# Patient Record
Sex: Male | Born: 1945 | Race: White | Hispanic: No | Smoking: Former smoker
Health system: Southern US, Community
[De-identification: ages and names within clinical notes are randomized; demographics above are authoritative.]

## PROBLEM LIST (undated history)

## (undated) DIAGNOSIS — I2510 Atherosclerotic heart disease of native coronary artery without angina pectoris: Secondary | ICD-10-CM

## (undated) DIAGNOSIS — K219 Gastro-esophageal reflux disease without esophagitis: Secondary | ICD-10-CM

## (undated) DIAGNOSIS — F40240 Claustrophobia: Secondary | ICD-10-CM

## (undated) DIAGNOSIS — F4310 Post-traumatic stress disorder, unspecified: Secondary | ICD-10-CM

## (undated) DIAGNOSIS — G959 Disease of spinal cord, unspecified: Secondary | ICD-10-CM

## (undated) DIAGNOSIS — K5792 Diverticulitis of intestine, part unspecified, without perforation or abscess without bleeding: Secondary | ICD-10-CM

## (undated) DIAGNOSIS — Z87442 Personal history of urinary calculi: Secondary | ICD-10-CM

## (undated) DIAGNOSIS — R350 Frequency of micturition: Secondary | ICD-10-CM

## (undated) DIAGNOSIS — E785 Hyperlipidemia, unspecified: Secondary | ICD-10-CM

## (undated) DIAGNOSIS — J189 Pneumonia, unspecified organism: Secondary | ICD-10-CM

## (undated) DIAGNOSIS — N200 Calculus of kidney: Secondary | ICD-10-CM

## (undated) DIAGNOSIS — M1990 Unspecified osteoarthritis, unspecified site: Secondary | ICD-10-CM

## (undated) DIAGNOSIS — Z86718 Personal history of other venous thrombosis and embolism: Secondary | ICD-10-CM

## (undated) DIAGNOSIS — M4712 Other spondylosis with myelopathy, cervical region: Secondary | ICD-10-CM

## (undated) DIAGNOSIS — J302 Other seasonal allergic rhinitis: Secondary | ICD-10-CM

## (undated) DIAGNOSIS — M5412 Radiculopathy, cervical region: Secondary | ICD-10-CM

## (undated) DIAGNOSIS — N400 Enlarged prostate without lower urinary tract symptoms: Secondary | ICD-10-CM

## (undated) HISTORY — PX: HEMORRHOID SURGERY: SHX153

## (undated) HISTORY — PX: CARDIOVASCULAR STRESS TEST: SHX262

## (undated) HISTORY — PX: KNEE SURGERY: SHX244

## (undated) HISTORY — PX: NASAL SINUS SURGERY: SHX719

## (undated) HISTORY — PX: TRIGGER FINGER RELEASE: SHX641

## (undated) HISTORY — PX: KIDNEY STONE SURGERY: SHX686

## (undated) HISTORY — PX: OTHER SURGICAL HISTORY: SHX169

## (undated) HISTORY — PX: PILONIDAL CYST / SINUS EXCISION: SUR543

## (undated) HISTORY — PX: EYE SURGERY: SHX253

## (undated) HISTORY — PX: COLONOSCOPY: SHX174

## (undated) HISTORY — PX: CARDIAC CATHETERIZATION: SHX172

## (undated) HISTORY — PX: SHOULDER SURGERY: SHX246

---

## 1998-03-21 ENCOUNTER — Ambulatory Visit (HOSPITAL_BASED_OUTPATIENT_CLINIC_OR_DEPARTMENT_OTHER): Admission: RE | Admit: 1998-03-21 | Discharge: 1998-03-21 | Payer: Self-pay | Admitting: Orthopedic Surgery

## 1998-06-30 ENCOUNTER — Other Ambulatory Visit: Admission: RE | Admit: 1998-06-30 | Discharge: 1998-06-30 | Payer: Self-pay | Admitting: Otolaryngology

## 2000-09-12 ENCOUNTER — Encounter: Payer: Self-pay | Admitting: General Surgery

## 2000-09-12 ENCOUNTER — Ambulatory Visit (HOSPITAL_COMMUNITY): Admission: RE | Admit: 2000-09-12 | Discharge: 2000-09-12 | Payer: Self-pay | Admitting: General Surgery

## 2000-09-24 ENCOUNTER — Ambulatory Visit (HOSPITAL_COMMUNITY): Admission: RE | Admit: 2000-09-24 | Discharge: 2000-09-24 | Payer: Self-pay | Admitting: Urology

## 2001-07-18 ENCOUNTER — Encounter: Payer: Self-pay | Admitting: Family Medicine

## 2001-07-18 ENCOUNTER — Ambulatory Visit (HOSPITAL_COMMUNITY): Admission: RE | Admit: 2001-07-18 | Discharge: 2001-07-18 | Payer: Self-pay | Admitting: Family Medicine

## 2001-12-03 ENCOUNTER — Encounter: Payer: Self-pay | Admitting: Urology

## 2001-12-03 ENCOUNTER — Ambulatory Visit (HOSPITAL_COMMUNITY): Admission: RE | Admit: 2001-12-03 | Discharge: 2001-12-03 | Payer: Self-pay | Admitting: Urology

## 2002-12-22 ENCOUNTER — Ambulatory Visit (HOSPITAL_COMMUNITY): Admission: RE | Admit: 2002-12-22 | Discharge: 2002-12-22 | Payer: Self-pay | Admitting: Urology

## 2002-12-22 ENCOUNTER — Encounter: Payer: Self-pay | Admitting: Urology

## 2003-12-30 ENCOUNTER — Encounter (HOSPITAL_COMMUNITY): Admission: RE | Admit: 2003-12-30 | Discharge: 2004-01-29 | Payer: Self-pay | Admitting: Orthopedic Surgery

## 2004-07-24 ENCOUNTER — Ambulatory Visit (HOSPITAL_COMMUNITY): Admission: RE | Admit: 2004-07-24 | Discharge: 2004-07-24 | Payer: Self-pay | Admitting: Urology

## 2004-09-01 ENCOUNTER — Ambulatory Visit (HOSPITAL_COMMUNITY): Admission: RE | Admit: 2004-09-01 | Discharge: 2004-09-01 | Payer: Self-pay | Admitting: *Deleted

## 2005-07-23 ENCOUNTER — Ambulatory Visit (HOSPITAL_COMMUNITY): Admission: RE | Admit: 2005-07-23 | Discharge: 2005-07-23 | Payer: Self-pay | Admitting: Urology

## 2005-10-30 ENCOUNTER — Ambulatory Visit (HOSPITAL_COMMUNITY): Admission: RE | Admit: 2005-10-30 | Discharge: 2005-10-30 | Payer: Self-pay | Admitting: General Surgery

## 2006-06-17 ENCOUNTER — Ambulatory Visit (HOSPITAL_COMMUNITY): Admission: RE | Admit: 2006-06-17 | Discharge: 2006-06-17 | Payer: Self-pay | Admitting: Internal Medicine

## 2006-06-24 ENCOUNTER — Ambulatory Visit: Payer: Self-pay | Admitting: Orthopedic Surgery

## 2006-08-16 ENCOUNTER — Ambulatory Visit (HOSPITAL_COMMUNITY): Admission: RE | Admit: 2006-08-16 | Discharge: 2006-08-16 | Payer: Self-pay | Admitting: Urology

## 2007-02-17 ENCOUNTER — Ambulatory Visit (HOSPITAL_COMMUNITY): Admission: RE | Admit: 2007-02-17 | Discharge: 2007-02-17 | Payer: Self-pay | Admitting: General Surgery

## 2007-02-17 ENCOUNTER — Encounter (INDEPENDENT_AMBULATORY_CARE_PROVIDER_SITE_OTHER): Payer: Self-pay | Admitting: General Surgery

## 2008-02-25 ENCOUNTER — Ambulatory Visit (HOSPITAL_COMMUNITY): Admission: RE | Admit: 2008-02-25 | Discharge: 2008-02-25 | Payer: Self-pay | Admitting: Urology

## 2008-07-05 ENCOUNTER — Ambulatory Visit (HOSPITAL_COMMUNITY): Admission: RE | Admit: 2008-07-05 | Discharge: 2008-07-05 | Payer: Self-pay | Admitting: Urology

## 2008-12-15 ENCOUNTER — Ambulatory Visit (HOSPITAL_COMMUNITY): Admission: RE | Admit: 2008-12-15 | Discharge: 2008-12-15 | Payer: Self-pay | Admitting: Urology

## 2009-01-03 ENCOUNTER — Encounter: Admission: RE | Admit: 2009-01-03 | Discharge: 2009-01-03 | Payer: Self-pay | Admitting: Orthopedic Surgery

## 2009-07-20 HISTORY — PX: US ECHOCARDIOGRAPHY: HXRAD669

## 2009-07-20 HISTORY — PX: NM MYOVIEW LTD: HXRAD82

## 2009-08-23 ENCOUNTER — Emergency Department (HOSPITAL_COMMUNITY): Admission: EM | Admit: 2009-08-23 | Discharge: 2009-08-23 | Payer: Self-pay | Admitting: Emergency Medicine

## 2010-07-02 ENCOUNTER — Encounter: Payer: Self-pay | Admitting: Internal Medicine

## 2010-09-03 LAB — CBC
HCT: 44.2 % (ref 39.0–52.0)
MCHC: 34.2 g/dL (ref 30.0–36.0)
MCV: 86.6 fL (ref 78.0–100.0)
RBC: 5.11 MIL/uL (ref 4.22–5.81)
RDW: 13.7 % (ref 11.5–15.5)

## 2010-09-03 LAB — HEPATIC FUNCTION PANEL
Albumin: 3.7 g/dL (ref 3.5–5.2)
Alkaline Phosphatase: 58 U/L (ref 39–117)
Bilirubin, Direct: 0.2 mg/dL (ref 0.0–0.3)
Total Protein: 6.6 g/dL (ref 6.0–8.3)

## 2010-09-03 LAB — BASIC METABOLIC PANEL
BUN: 14 mg/dL (ref 6–23)
CO2: 25 mEq/L (ref 19–32)
Chloride: 105 mEq/L (ref 96–112)
Creatinine, Ser: 1.04 mg/dL (ref 0.4–1.5)
GFR calc Af Amer: >60 mL/min (ref >60)
Potassium: 3.3 mEq/L — ABNORMAL LOW (ref 3.5–5.1)

## 2010-09-03 LAB — DIFFERENTIAL
Basophils Absolute: 0 10*3/uL (ref 0.0–0.1)
Basophils Relative: 1 % (ref 0–1)
Eosinophils Absolute: 0 10*3/uL (ref 0.0–0.7)
Eosinophils Relative: 0 % (ref 0–5)
Lymphocytes Relative: 16 % (ref 12–46)
Lymphs Abs: 1.4 10*3/uL (ref 0.7–4.0)
Monocytes Absolute: 0.5 10*3/uL (ref 0.1–1.0)
Neutro Abs: 6.8 10*3/uL (ref 1.7–7.7)

## 2010-09-18 ENCOUNTER — Emergency Department (HOSPITAL_COMMUNITY)
Admission: EM | Admit: 2010-09-18 | Discharge: 2010-09-18 | Disposition: A | Discharge status: Home / Self Care | Payer: Medicare Other | Attending: Emergency Medicine | Admitting: Emergency Medicine

## 2010-09-18 DIAGNOSIS — S46909A Unspecified injury of unspecified muscle, fascia and tendon at shoulder and upper arm level, unspecified arm, initial encounter: Secondary | ICD-10-CM | POA: Insufficient documentation

## 2010-09-18 DIAGNOSIS — E7800 Pure hypercholesterolemia, unspecified: Secondary | ICD-10-CM | POA: Insufficient documentation

## 2010-09-18 DIAGNOSIS — M25519 Pain in unspecified shoulder: Secondary | ICD-10-CM | POA: Insufficient documentation

## 2010-09-18 DIAGNOSIS — M545 Low back pain, unspecified: Secondary | ICD-10-CM | POA: Insufficient documentation

## 2010-09-18 DIAGNOSIS — S4980XA Other specified injuries of shoulder and upper arm, unspecified arm, initial encounter: Secondary | ICD-10-CM | POA: Insufficient documentation

## 2010-09-18 DIAGNOSIS — IMO0002 Reserved for concepts with insufficient information to code with codable children: Secondary | ICD-10-CM | POA: Insufficient documentation

## 2010-09-18 DIAGNOSIS — Z86718 Personal history of other venous thrombosis and embolism: Secondary | ICD-10-CM | POA: Insufficient documentation

## 2010-09-18 DIAGNOSIS — R209 Unspecified disturbances of skin sensation: Secondary | ICD-10-CM | POA: Insufficient documentation

## 2010-09-18 DIAGNOSIS — IMO0001 Reserved for inherently not codable concepts without codable children: Secondary | ICD-10-CM | POA: Insufficient documentation

## 2010-10-24 NOTE — Op Note (Signed)
NAME:  Casey Castillo, Casey Castillo NO.:  [id number]   MEDICAL RECORD NO.:  [id number]          PATIENT TYPE:  AMB   LOCATION:  DAY                           FACILITY:  [facility]   PHYSICIAN:  Dalia Heading, M.D.  DATE OF BIRTH:  1946-03-14   DATE OF PROCEDURE:  02/17/2007  DATE OF DISCHARGE:                               OPERATIVE REPORT   PREOPERATIVE DIAGNOSIS:  Bleeding hemorrhoidal disease.   POSTOPERATIVE DIAGNOSIS:  Bleeding hemorrhoidal disease.   PROCEDURE:  Extensive hemorrhoidectomy.   SURGEON:  Dalia Heading, M.D.   ANESTHESIA:  General.   INDICATIONS:  The patient is a 65 year old of white male who presents  with bleeding hemorrhoidal disease.  The risks and benefits of the  procedure including bleeding, infection, and recurrence of the  hemorrhoidal disease, were fully explained to the patient, who gave  informed consent.   PROCEDURE NOTE:  The patient was placed in the lithotomy position after  general anesthesia was administered.  The perineum was prepped and  draped using the usual sterile technique with Betadine.  Surgical site  confirmation was performed.   The patient was noted to have extensive hemorrhoidal disease.  The most  significant was along the left lateral aspect of the anus.  An internal  and external hemorrhoidectomy was performed in continuity.  The specimen  was sent to pathology for further examination.  The mucosal edges were  reapproximated using 2-0 Vicryl running suture.  A small external  hemorrhoidal skin tab was noted at the 12 o'clock position.  This was  excised without difficulty and the mucosal edges were reapproximated  using a 3-0 Vicryl interrupted suture.  The patient did have some other  hemorrhoidal disease, though it was not significant enough to warrant  surgery at this time.  Sensorcaine 0.5% was instilled into the  surrounding perineum.  Surgicel and viscous Xylocaine packing was then  placed into the rectum.   All tape and needle counts were correct at the end of the procedure.  The patient was awakened and transferred to PACU in stable condition.   COMPLICATIONS:  None.   SPECIMEN:  Hemorrhoids.   BLOOD LOSS:  Minimal.      Dalia Heading, M.D.  Electronically Signed     MAJ/MEDQ  D:  02/17/2007  T:  02/17/2007  Job:  [id number]   cc:   Patrica Duel, M.D.  Fax: [phone]

## 2010-10-24 NOTE — H&P (Signed)
NAME:  Casey Castillo, Casey Castillo NO.:  [id number]   MEDICAL RECORD NO.:  [id number]          PATIENT TYPE:  AMB   LOCATION:  DAY                           FACILITY:  [facility]   PHYSICIAN:  Dalia Heading, M.D.  DATE OF BIRTH:  1946/04/19   DATE OF ADMISSION:  DATE OF DISCHARGE:  LH                              HISTORY & PHYSICAL   CHIEF COMPLAINT:  Bleeding hemorrhoidal disease.   HISTORY OF PRESENT ILLNESS:  The patient is a 65 year old white male who  is referred for evaluation and treatment of hemorrhoidal disease.  This  has been present for some time; but he is starting to have more  discomfort and itching.  Some blood is noted on the toilet paper when he  wipes himself.   PAST MEDICAL HISTORY:  As noted above.   PAST SURGICAL HISTORY:  1. Colonoscopy.  2. Knee surgery.  3. Right hand surgery.   CURRENT MEDICATIONS:  Vytorin, Flomax, baby aspirin, fish oil.   ALLERGIES:  No known drug allergies.   REVIEW OF SYSTEMS:  Noncontributory.   PHYSICAL EXAMINATION:  GENERAL:  The patient is a well-developed, well-  nourished white male in no acute distress.  LUNGS:  Clear to auscultation with equal breath sounds bilaterally.  HEART:  Examination reveals regular rate and rhythm without S3-S4,  or  murmurs.  ABDOMEN:  Soft, nontender, nondistended.  No hepatosplenomegaly, masses,  hernias are identified.  RECTAL:  Examination reveals external hemorrhoidal skin tag anteriorly  with a large prolapsing hemorrhoid noted along the left lateral aspect  of the anus.   IMPRESSION:  Bleeding hemorrhoidal disease.   PLAN:  The patient is scheduled for extensive hemorrhoidectomy on  February 17, 2007.  Risks and benefits of the procedure including  bleeding, infection, pain, recurrence of hemorrhoids were fully  explained to the patient, who gave informed consent.      Dalia Heading, M.D.  Electronically Signed     MAJ/MEDQ  D:  02/11/2007  T:  02/12/2007  Job:   [id number]   cc:   Patrica Duel, M.D.  Fax: [phone]

## 2010-10-27 NOTE — H&P (Signed)
NAME:  Casey Castillo, MAISH NO.:  [id number]   MEDICAL RECORD NO.:  [id number]          PATIENT TYPE:   LOCATION:                                 FACILITY:   PHYSICIAN:  Dalia Heading, M.D.  DATE OF BIRTH:  25-Mar-1946   DATE OF ADMISSION:  DATE OF DISCHARGE:  LH                                HISTORY & PHYSICAL   CHIEF COMPLAINT:  History of colon polyps.   HISTORY OF PRESENT ILLNESS:  The patient is a 65 year old white male who is  referred for endoscopic evaluation.  The patient was found on a colonoscopy  in 2002 to have colon polyps in the ascending colon and sigmoid colon  regions.  Does have a history of diverticulitis.  There is no abdominal  pain, weight loss, nausea, vomiting, diarrhea, constipation, melena,  hematochezia.  There is no family history of colon carcinoma.   PAST MEDICAL HISTORY:  As noted above.   PAST SURGICAL HISTORY:  Knee surgery, right hand surgery.   CURRENT MEDICATIONS:  None.   ALLERGIES:  No known drug allergies.   REVIEW OF SYSTEMS:  Noncontributory.   PHYSICAL EXAMINATION:  The patient is a well-developed, well-nourished white  male in no acute distress.  Lungs are clear to auscultation with equal  breath sounds bilaterally.  Heart examination reveals a regular rate and  rhythm without S3, S4, murmurs.  The abdomen is soft, nontender,  nondistended.  No hepatosplenomegaly or masses are noted.  Rectal  examination was deferred to the procedure.   IMPRESSION:  History of colon polyps, diverticulosis.   PLAN:  The patient is scheduled for colonoscopy on Oct 30, 2005.  Risks and  benefits of the procedure including bleeding and perforation were fully  explained to the patient, gained informed consent.      Dalia Heading, M.D.  Electronically Signed     MAJ/MEDQ  D:  10/16/2005  T:  10/16/2005  Job:  [id number]   cc:   Madelin Rear. Sherwood Gambler, MD  Fax: [phone]

## 2011-03-23 LAB — BASIC METABOLIC PANEL
BUN: 13
CO2: 29
Calcium: 8.8
Creatinine, Ser: 1.11
Glucose, Bld: 97

## 2011-03-23 LAB — CBC
HCT: 41.8
Hemoglobin: 14.2
MCV: 84.7
RBC: 4.94
WBC: 7.5

## 2011-12-31 ENCOUNTER — Other Ambulatory Visit: Payer: Self-pay | Admitting: Neurological Surgery

## 2011-12-31 DIAGNOSIS — M47812 Spondylosis without myelopathy or radiculopathy, cervical region: Secondary | ICD-10-CM

## 2012-01-08 ENCOUNTER — Ambulatory Visit
Admission: RE | Admit: 2012-01-08 | Discharge: 2012-01-08 | Disposition: A | Discharge status: Home / Self Care | Payer: Medicare Other | Source: Ambulatory Visit | Attending: Neurological Surgery | Admitting: Neurological Surgery

## 2012-01-08 DIAGNOSIS — M47812 Spondylosis without myelopathy or radiculopathy, cervical region: Secondary | ICD-10-CM

## 2012-01-16 ENCOUNTER — Other Ambulatory Visit: Payer: Self-pay | Admitting: Neurological Surgery

## 2012-01-16 ENCOUNTER — Encounter (HOSPITAL_COMMUNITY): Payer: Self-pay | Admitting: Pharmacy Technician

## 2012-01-22 ENCOUNTER — Ambulatory Visit (HOSPITAL_COMMUNITY)
Admission: RE | Admit: 2012-01-22 | Discharge: 2012-01-22 | Disposition: A | Discharge status: Home / Self Care | Payer: Medicare Other | Source: Ambulatory Visit | Attending: Neurological Surgery | Admitting: Neurological Surgery

## 2012-01-22 ENCOUNTER — Encounter (HOSPITAL_COMMUNITY)
Admission: RE | Admit: 2012-01-22 | Discharge: 2012-01-22 | Disposition: A | Discharge status: Home / Self Care | Payer: Medicare Other | Source: Ambulatory Visit | Attending: Neurological Surgery | Admitting: Neurological Surgery

## 2012-01-22 ENCOUNTER — Encounter (HOSPITAL_COMMUNITY): Payer: Self-pay

## 2012-01-22 DIAGNOSIS — Z01812 Encounter for preprocedural laboratory examination: Secondary | ICD-10-CM | POA: Insufficient documentation

## 2012-01-22 DIAGNOSIS — Z01818 Encounter for other preprocedural examination: Secondary | ICD-10-CM | POA: Insufficient documentation

## 2012-01-22 HISTORY — DX: Atherosclerotic heart disease of native coronary artery without angina pectoris: I25.10

## 2012-01-22 HISTORY — DX: Frequency of micturition: R35.0

## 2012-01-22 HISTORY — DX: Hyperlipidemia, unspecified: E78.5

## 2012-01-22 HISTORY — DX: Benign prostatic hyperplasia without lower urinary tract symptoms: N40.0

## 2012-01-22 HISTORY — DX: Calculus of kidney: N20.0

## 2012-01-22 HISTORY — DX: Diverticulitis of intestine, part unspecified, without perforation or abscess without bleeding: K57.92

## 2012-01-22 HISTORY — DX: Personal history of other venous thrombosis and embolism: Z86.718

## 2012-01-22 LAB — CBC
Platelets: 173 10*3/uL (ref 150–400)
RBC: 5.08 MIL/uL (ref 4.22–5.81)
WBC: 7.9 10*3/uL (ref 4.0–10.5)

## 2012-01-22 LAB — SURGICAL PCR SCREEN: MRSA, PCR: NEGATIVE

## 2012-01-22 LAB — BASIC METABOLIC PANEL
BUN: 14 mg/dL (ref 6–23)
GFR calc Af Amer: 72 mL/min — ABNORMAL LOW (ref >90)
GFR calc non Af Amer: 62 mL/min — ABNORMAL LOW (ref >90)
Potassium: 3.7 mEq/L (ref 3.5–5.1)
Sodium: 141 mEq/L (ref 135–145)

## 2012-01-22 NOTE — Progress Notes (Signed)
Requested most recent EKG, last office visit notes, stress test and echo from [facility] heart and vascular

## 2012-01-22 NOTE — Pre-Procedure Instructions (Signed)
20 Casey Castillo  01/22/2012   Your procedure is scheduled on:  Monday January 28, 2012  Report to [facility] at 10:45 AM.  Call this number if you have problems the morning of surgery: [phone]   Remember:   Do not eat food or drink:After Midnight.      Take these medicines the morning of surgery with A SIP OF WATER: flomax  DISCONTINUE ASPIRIN, COUMADIN, PLAVIX, EFFIENT, AND HERBAL MEDICATIONS   Do not wear jewelry, make-up or nail polish.  Do not wear lotions, powders, or perfumes. You may wear deodorant.  Do not shave 48 hours prior to surgery. Men may shave face and neck.  Do not bring valuables to the hospital.  Contacts, dentures or bridgework may not be worn into surgery.  Leave suitcase in the car. After surgery it may be brought to your room.  For patients admitted to the hospital, checkout time is 11:00 AM the day of discharge.   Patients discharged the day of surgery will not be allowed to drive home.  Name and phone number of your driver: Heywood Bene [phone]  Special Instructions: CHG Shower Use Special Wash: 1/2 bottle night before surgery and 1/2 bottle morning of surgery.   Please read over the following fact sheets that you were given: Pain Booklet, Coughing and Deep Breathing, MRSA Information and Surgical Site Infection Prevention

## 2012-01-23 NOTE — Consult Note (Signed)
Anesthesia chart review: Patient is a 66 year old male scheduled for C6-7 and C7-T1 ACDF on 01/28/2012 by Dr. Danielle Dess.  History includes former smoker, hyperlipidemia, diverticulitis, BPH, non-obstructive coronary artery disease (per [facility] notes), kidney stones, prior nasal/sinus, knee, shoulder, finger, and hemorrhoid surgeries.   His Cardiologist is Dr. Royann Shivers [facility]).  He was last seen on 12/25/11.  EKG then showed NSR.    Nuclear stress test [facility]) on 07/20/09 showed diaphragmatic attenuation, but no evidence of inducible myocardial ischemia, post-stress ejection fraction 61%, normal global left ventricular systolic function, low risk scan.  Echo [facility]) on 07/20/09 showed mild concentric LVH, normal LV systolic function, RV mildly dilated, LA mildly dilated, RA mildly dilated, trace MR, RV systolic pressure elevated at 09-81 mm Hg, trace tricuspid regurgitation.  Chest x-ray on 01/22/2012 showed no active cardiopulmonary disease.  Labs noted.  Anticipate he can proceed as planned.  Shonna Chock, PA-C

## 2012-01-27 MED ORDER — CEFAZOLIN SODIUM-DEXTROSE 2-3 GM-% IV SOLR
2.0000 g | INTRAVENOUS | Status: AC
  Administered 2012-01-28: 2 g via INTRAVENOUS
  Filled 2012-01-27: qty 50

## 2012-01-28 ENCOUNTER — Encounter (HOSPITAL_COMMUNITY)
Admission: RE | Disposition: A | Discharge status: Home / Self Care | Payer: Self-pay | Source: Ambulatory Visit | Attending: Neurological Surgery

## 2012-01-28 ENCOUNTER — Encounter (HOSPITAL_COMMUNITY): Payer: Self-pay | Admitting: Vascular Surgery

## 2012-01-28 ENCOUNTER — Ambulatory Visit (HOSPITAL_COMMUNITY): Payer: Medicare Other | Admitting: Vascular Surgery

## 2012-01-28 ENCOUNTER — Ambulatory Visit (HOSPITAL_COMMUNITY): Payer: Medicare Other

## 2012-01-28 ENCOUNTER — Encounter (HOSPITAL_COMMUNITY): Payer: Self-pay | Admitting: *Deleted

## 2012-01-28 ENCOUNTER — Inpatient Hospital Stay (HOSPITAL_COMMUNITY)
Admission: RE | Admit: 2012-01-28 | Discharge: 2012-01-29 | DRG: 473 | Disposition: A | Discharge status: Home / Self Care | Payer: Medicare Other | Source: Ambulatory Visit | Attending: Neurological Surgery | Admitting: Neurological Surgery

## 2012-01-28 DIAGNOSIS — E785 Hyperlipidemia, unspecified: Secondary | ICD-10-CM | POA: Diagnosis present

## 2012-01-28 DIAGNOSIS — Z79899 Other long term (current) drug therapy: Secondary | ICD-10-CM

## 2012-01-28 DIAGNOSIS — M47814 Spondylosis without myelopathy or radiculopathy, thoracic region: Secondary | ICD-10-CM | POA: Diagnosis present

## 2012-01-28 DIAGNOSIS — M47812 Spondylosis without myelopathy or radiculopathy, cervical region: Secondary | ICD-10-CM

## 2012-01-28 DIAGNOSIS — M5020 Other cervical disc displacement, unspecified cervical region: Secondary | ICD-10-CM | POA: Diagnosis present

## 2012-01-28 DIAGNOSIS — Z7982 Long term (current) use of aspirin: Secondary | ICD-10-CM

## 2012-01-28 DIAGNOSIS — I2510 Atherosclerotic heart disease of native coronary artery without angina pectoris: Secondary | ICD-10-CM | POA: Diagnosis present

## 2012-01-28 HISTORY — PX: ANTERIOR CERVICAL DECOMP/DISCECTOMY FUSION: SHX1161

## 2012-01-28 LAB — TYPE AND SCREEN: Antibody Screen: NEGATIVE

## 2012-01-28 SURGERY — ANTERIOR CERVICAL DECOMPRESSION/DISCECTOMY FUSION 2 LEVELS
Anesthesia: General | Site: Neck | Wound class: Clean

## 2012-01-28 MED ORDER — SODIUM CHLORIDE 0.9 % IJ SOLN: 3.0000 mL | Freq: Two times a day (BID) | INTRAMUSCULAR | Status: DC

## 2012-01-28 MED ORDER — GLYCOPYRROLATE 0.2 MG/ML IJ SOLN
INTRAMUSCULAR | Status: DC | PRN
  Administered 2012-01-28: 0.4 mg via INTRAVENOUS

## 2012-01-28 MED ORDER — LACTATED RINGERS IV SOLN
INTRAVENOUS | Status: DC | PRN
  Administered 2012-01-28 (×2): via INTRAVENOUS

## 2012-01-28 MED ORDER — LORATADINE 10 MG PO TABS
10.0000 mg | ORAL_TABLET | Freq: Every day | ORAL | Status: DC
Start: 2012-01-28 — End: 2012-01-29
  Filled 2012-01-28 (×2): qty 1

## 2012-01-28 MED ORDER — TAMSULOSIN HCL 0.4 MG PO CAPS: 0.4000 mg | ORAL_CAPSULE | Freq: Every day | ORAL | Status: DC

## 2012-01-28 MED ORDER — DOCUSATE SODIUM 100 MG PO CAPS
100.0000 mg | ORAL_CAPSULE | Freq: Two times a day (BID) | ORAL | Status: DC
  Administered 2012-01-28: 100 mg via ORAL
  Filled 2012-01-28: qty 1

## 2012-01-28 MED ORDER — NEOSTIGMINE METHYLSULFATE 1 MG/ML IJ SOLN
INTRAMUSCULAR | Status: DC | PRN
  Administered 2012-01-28: 3 mg via INTRAVENOUS

## 2012-01-28 MED ORDER — VECURONIUM BROMIDE 10 MG IV SOLR
INTRAVENOUS | Status: DC | PRN
  Administered 2012-01-28: 1 mg via INTRAVENOUS
  Administered 2012-01-28: 2 mg via INTRAVENOUS
  Administered 2012-01-28: 1 mg via INTRAVENOUS

## 2012-01-28 MED ORDER — ACETAMINOPHEN 650 MG RE SUPP: 650.0000 mg | RECTAL | Status: DC | PRN

## 2012-01-28 MED ORDER — DEXAMETHASONE SODIUM PHOSPHATE 10 MG/ML IJ SOLN
INTRAMUSCULAR | Status: DC | PRN
Start: 2012-01-28 — End: 2012-01-28
  Administered 2012-01-28: 10 mg via INTRAVENOUS

## 2012-01-28 MED ORDER — SENNA 8.6 MG PO TABS
1.0000 | ORAL_TABLET | Freq: Two times a day (BID) | ORAL | Status: DC
  Filled 2012-01-28 (×2): qty 1

## 2012-01-28 MED ORDER — MEPERIDINE HCL 25 MG/ML IJ SOLN: 6.2500 mg | INTRAMUSCULAR | Status: DC | PRN

## 2012-01-28 MED ORDER — SIMVASTATIN 40 MG PO TABS
40.0000 mg | ORAL_TABLET | Freq: Every day | ORAL | Status: DC
Start: 2012-01-28 — End: 2012-01-29
  Administered 2012-01-28: 40 mg via ORAL
  Filled 2012-01-28 (×2): qty 1

## 2012-01-28 MED ORDER — HEMOSTATIC AGENTS (NO CHARGE) OPTIME
TOPICAL | Status: DC | PRN
  Administered 2012-01-28: 1 via TOPICAL

## 2012-01-28 MED ORDER — ONDANSETRON HCL 4 MG/2ML IJ SOLN: 4.0000 mg | INTRAMUSCULAR | Status: DC | PRN

## 2012-01-28 MED ORDER — MENTHOL 3 MG MT LOZG: 1.0000 | LOZENGE | OROMUCOSAL | Status: DC | PRN

## 2012-01-28 MED ORDER — BACITRACIN 50000 UNITS IM SOLR
INTRAMUSCULAR | Status: AC
  Filled 2012-01-28: qty 1

## 2012-01-28 MED ORDER — SODIUM CHLORIDE 0.9 % IV SOLN
INTRAVENOUS | Status: AC
  Filled 2012-01-28: qty 500

## 2012-01-28 MED ORDER — ALUM & MAG HYDROXIDE-SIMETH 200-200-20 MG/5ML PO SUSP: 30.0000 mL | Freq: Four times a day (QID) | ORAL | Status: DC | PRN

## 2012-01-28 MED ORDER — THROMBIN 5000 UNITS EX KIT
PACK | CUTANEOUS | Status: DC | PRN
  Administered 2012-01-28 (×2): 5000 [IU] via TOPICAL

## 2012-01-28 MED ORDER — ROCURONIUM BROMIDE 100 MG/10ML IV SOLN
INTRAVENOUS | Status: DC | PRN
  Administered 2012-01-28: 50 mg via INTRAVENOUS

## 2012-01-28 MED ORDER — PROPOFOL 10 MG/ML IV EMUL
INTRAVENOUS | Status: DC | PRN
  Administered 2012-01-28: 200 mg via INTRAVENOUS

## 2012-01-28 MED ORDER — ONDANSETRON HCL 4 MG/2ML IJ SOLN
INTRAMUSCULAR | Status: DC | PRN
  Administered 2012-01-28: 4 mg via INTRAVENOUS

## 2012-01-28 MED ORDER — FENTANYL CITRATE 0.05 MG/ML IJ SOLN
INTRAMUSCULAR | Status: DC | PRN
  Administered 2012-01-28: 200 ug via INTRAVENOUS
  Administered 2012-01-28 (×4): 50 ug via INTRAVENOUS

## 2012-01-28 MED ORDER — TAMSULOSIN HCL 0.4 MG PO CAPS
0.4000 mg | ORAL_CAPSULE | Freq: Every day | ORAL | Status: DC
  Administered 2012-01-28 – 2012-01-29 (×2): 0.4 mg via ORAL
  Filled 2012-01-28 (×2): qty 1

## 2012-01-28 MED ORDER — SODIUM CHLORIDE 0.9 % IV SOLN: 250.0000 mL | INTRAVENOUS | Status: DC

## 2012-01-28 MED ORDER — HYDROMORPHONE HCL PF 1 MG/ML IJ SOLN: 0.2500 mg | INTRAMUSCULAR | Status: DC | PRN

## 2012-01-28 MED ORDER — MIDAZOLAM HCL 5 MG/5ML IJ SOLN
INTRAMUSCULAR | Status: DC | PRN
  Administered 2012-01-28: 1 mg via INTRAVENOUS

## 2012-01-28 MED ORDER — OXYCODONE-ACETAMINOPHEN 5-325 MG PO TABS
1.0000 | ORAL_TABLET | ORAL | Status: DC | PRN
  Administered 2012-01-28 – 2012-01-29 (×2): 2 via ORAL
  Filled 2012-01-28 (×2): qty 2

## 2012-01-28 MED ORDER — LIDOCAINE HCL (CARDIAC) 20 MG/ML IV SOLN
INTRAVENOUS | Status: DC | PRN
  Administered 2012-01-28: 30 mg via INTRAVENOUS

## 2012-01-28 MED ORDER — LIDOCAINE-EPINEPHRINE 1 %-1:100000 IJ SOLN
INTRAMUSCULAR | Status: DC | PRN
  Administered 2012-01-28: 3.5 mL

## 2012-01-28 MED ORDER — MIDAZOLAM HCL 2 MG/2ML IJ SOLN: 0.5000 mg | Freq: Once | INTRAMUSCULAR | Status: DC | PRN

## 2012-01-28 MED ORDER — DIAZEPAM 5 MG PO TABS: 5.0000 mg | ORAL_TABLET | Freq: Four times a day (QID) | ORAL | Status: DC | PRN

## 2012-01-28 MED ORDER — MORPHINE SULFATE 2 MG/ML IJ SOLN
1.0000 mg | INTRAMUSCULAR | Status: DC | PRN
  Administered 2012-01-28: 4 mg via INTRAVENOUS
  Administered 2012-01-28: 2 mg via INTRAVENOUS
  Filled 2012-01-28: qty 1
  Filled 2012-01-28: qty 2

## 2012-01-28 MED ORDER — SODIUM CHLORIDE 0.9 % IJ SOLN: 3.0000 mL | INTRAMUSCULAR | Status: DC | PRN

## 2012-01-28 MED ORDER — PROMETHAZINE HCL 25 MG/ML IJ SOLN: 6.2500 mg | INTRAMUSCULAR | Status: DC | PRN

## 2012-01-28 MED ORDER — LIDOCAINE HCL 4 % MT SOLN
OROMUCOSAL | Status: DC | PRN
  Administered 2012-01-28: 4 mL via TOPICAL

## 2012-01-28 MED ORDER — CEFAZOLIN SODIUM 1-5 GM-% IV SOLN
1.0000 g | Freq: Three times a day (TID) | INTRAVENOUS | Status: AC
  Administered 2012-01-28 – 2012-01-29 (×2): 1 g via INTRAVENOUS
  Filled 2012-01-28 (×2): qty 50

## 2012-01-28 MED ORDER — PHENOL 1.4 % MT LIQD: 1.0000 | OROMUCOSAL | Status: DC | PRN

## 2012-01-28 MED ORDER — BUPIVACAINE HCL (PF) 0.5 % IJ SOLN
INTRAMUSCULAR | Status: DC | PRN
  Administered 2012-01-28: 3.5 mL

## 2012-01-28 MED ORDER — 0.9 % SODIUM CHLORIDE (POUR BTL) OPTIME
TOPICAL | Status: DC | PRN
  Administered 2012-01-28: 1000 mL

## 2012-01-28 MED ORDER — ACETAMINOPHEN 325 MG PO TABS: 650.0000 mg | ORAL_TABLET | ORAL | Status: DC | PRN

## 2012-01-28 MED ORDER — SODIUM CHLORIDE 0.9 % IR SOLN
Status: DC | PRN
  Administered 2012-01-28: 16:00:00

## 2012-01-28 SURGICAL SUPPLY — 57 items
ADH SKN CLS APL DERMABOND .7 (GAUZE/BANDAGES/DRESSINGS) ×1
BAG DECANTER FOR FLEXI CONT (MISCELLANEOUS) ×2 IMPLANT
BANDAGE GAUZE ELAST BULKY 4 IN (GAUZE/BANDAGES/DRESSINGS) ×2 IMPLANT
BIT DRILL NEURO 2X3.1 SFT TUCH (MISCELLANEOUS) ×1 IMPLANT
BUR BARREL STRAIGHT FLUTE 4.0 (BURR) ×2 IMPLANT
CANISTER SUCTION 2500CC (MISCELLANEOUS) ×2 IMPLANT
CLOTH BEACON ORANGE TIMEOUT ST (SAFETY) ×2 IMPLANT
CONT SPEC 4OZ CLIKSEAL STRL BL (MISCELLANEOUS) ×2 IMPLANT
DECANTER SPIKE VIAL GLASS SM (MISCELLANEOUS) ×1 IMPLANT
DERMABOND ADVANCED (GAUZE/BANDAGES/DRESSINGS) ×1
DERMABOND ADVANCED .7 DNX12 (GAUZE/BANDAGES/DRESSINGS) ×1 IMPLANT
DRAPE LAPAROTOMY 100X72 PEDS (DRAPES) ×2 IMPLANT
DRAPE MICROSCOPE LEICA (MISCELLANEOUS) IMPLANT
DRAPE POUCH INSTRU U-SHP 10X18 (DRAPES) ×2 IMPLANT
DRILL NEURO 2X3.1 SOFT TOUCH (MISCELLANEOUS) ×2
DURAPREP 6ML APPLICATOR 50/CS (WOUND CARE) ×2 IMPLANT
ELECT REM PT RETURN 9FT ADLT (ELECTROSURGICAL) ×2
ELECTRODE REM PT RTRN 9FT ADLT (ELECTROSURGICAL) ×1 IMPLANT
GAUZE SPONGE 4X4 16PLY XRAY LF (GAUZE/BANDAGES/DRESSINGS) ×1 IMPLANT
GLOVE BIO SURGEON STRL SZ8.5 (GLOVE) ×1 IMPLANT
GLOVE BIOGEL PI IND STRL 7.5 (GLOVE) IMPLANT
GLOVE BIOGEL PI IND STRL 8.5 (GLOVE) ×1 IMPLANT
GLOVE BIOGEL PI INDICATOR 7.5 (GLOVE) ×2
GLOVE BIOGEL PI INDICATOR 8.5 (GLOVE) ×1
GLOVE ECLIPSE 7.5 STRL STRAW (GLOVE) ×2 IMPLANT
GLOVE ECLIPSE 8.5 STRL (GLOVE) ×2 IMPLANT
GLOVE EXAM NITRILE LRG STRL (GLOVE) IMPLANT
GLOVE EXAM NITRILE MD LF STRL (GLOVE) IMPLANT
GLOVE EXAM NITRILE XL STR (GLOVE) IMPLANT
GLOVE EXAM NITRILE XS STR PU (GLOVE) IMPLANT
GLOVE SS BIOGEL STRL SZ 8 (GLOVE) IMPLANT
GLOVE SUPERSENSE BIOGEL SZ 8 (GLOVE) ×2
GOWN BRE IMP SLV AUR LG STRL (GOWN DISPOSABLE) IMPLANT
GOWN BRE IMP SLV AUR XL STRL (GOWN DISPOSABLE) ×2 IMPLANT
GOWN STRL REIN 2XL LVL4 (GOWN DISPOSABLE) ×2 IMPLANT
HEAD HALTER (SOFTGOODS) ×2 IMPLANT
KIT BASIN OR (CUSTOM PROCEDURE TRAY) ×2 IMPLANT
KIT ROOM TURNOVER OR (KITS) ×2 IMPLANT
NDL SPNL 22GX3.5 QUINCKE BK (NEEDLE) IMPLANT
NEEDLE HYPO 22GX1.5 SAFETY (NEEDLE) ×2 IMPLANT
NEEDLE SPNL 22GX3.5 QUINCKE BK (NEEDLE) ×4 IMPLANT
NS IRRIG 1000ML POUR BTL (IV SOLUTION) ×2 IMPLANT
PACK LAMINECTOMY NEURO (CUSTOM PROCEDURE TRAY) ×2 IMPLANT
PAD ARMBOARD 7.5X6 YLW CONV (MISCELLANEOUS) ×6 IMPLANT
PLATE 32MM (Plate) ×1 IMPLANT
PUTTY BONE 2.5CC ×1 IMPLANT
RUBBERBAND STERILE (MISCELLANEOUS) IMPLANT
SCREW 14MM (Screw) ×6 IMPLANT
SPONGE GAUZE 4X4 12PLY (GAUZE/BANDAGES/DRESSINGS) ×2 IMPLANT
SPONGE INTESTINAL PEANUT (DISPOSABLE) ×2 IMPLANT
SPONGE SURGIFOAM ABS GEL SZ50 (HEMOSTASIS) ×2 IMPLANT
SUT VIC AB 3-0 SH 8-18 (SUTURE) ×4 IMPLANT
SYR 20ML ECCENTRIC (SYRINGE) ×2 IMPLANT
TOWEL OR 17X24 6PK STRL BLUE (TOWEL DISPOSABLE) ×2 IMPLANT
TOWEL OR 17X26 10 PK STRL BLUE (TOWEL DISPOSABLE) ×2 IMPLANT
TZ Cervical Lordotic Large 6 mm ×2 IMPLANT
WATER STERILE IRR 1000ML POUR (IV SOLUTION) ×2 IMPLANT

## 2012-01-28 NOTE — H&P (Signed)
Casey Castillo presents for surgery today.   He has been having symptoms in that left shoulder and arm, and he had a new MRI of his cervical spine which demonstrates that he has evidence of some disease process at four levels including C3-4, 4-5, 5-6, and 6-7 and 7-1, which is actually five levels. However, the most significant areas in regards to his problem are at C6-7 and C7-T1.  He has radiculopathy on the left side involving the C7 and C8 distributions.   I demonstrated the findings to him. Though I do note that he has some degenerative changes at C5-6 and C4-5, I have advised that we should consider surgical intervention only for the lower levels of the cervical spine.  I indicated that it would be best to do a two-level anterior decompression and arthrodesis at C6-7 and C7-T1. This should help eliminate the worst of the C7 and C8 radiculopathy that he has on that left side.  I indicated that at some point he may require surgical decompression at 5-6 and 4-5; however, at this time this would be more surgery than is required for the symptomatic levels that he is experiencing.  I indicated that the surgery would be done through the front of the neck. This would be done by an incision low on the neck on the left side. We would remove the entirety of the discs at C6-7 and C7-T1 and we would place a bone graft into that space with a Titanium plate to hold it together. The down time should be quite minimal for him.  Most patients have the surgery with a simple overnight stay or sometimes even on an outpatient basis if it is done early enough in the day.  Thereafter the patient will require several weeks of low-grade activity before he gets back to a more vigorous working program.  I believe that this will help with the worst of his symptoms. I did caution him though that he does have other disease at other levels and at some point if those levels become symptomatic we may need to address them also.  He has failed  efforts at conservative management now having been treated by Dr. Delbert Harness.  He had Cortisone shot in the shoulder and he had a course of some oral Cortisone which did not seem to give him much relief.  The symptoms are persisting and he would like something definitive done. We will proceed with surgical intervention at the earliest possible convenience.

## 2012-01-28 NOTE — Transfer of Care (Signed)
Immediate Anesthesia Transfer of Care Note  Patient: Casey Castillo  Procedure(s) Performed: Procedure(s) (LRB): ANTERIOR CERVICAL DECOMPRESSION/DISCECTOMY FUSION 2 LEVELS (N/A)  Patient Location: PACU  Anesthesia Type: General  Level of Consciousness: awake, alert  and oriented  Airway & Oxygen Therapy: Patient Spontanous Breathing and Patient connected to nasal cannula oxygen  Post-op Assessment: Report given to PACU RN and Post -op Vital signs reviewed and stable  Post vital signs: Reviewed and stable  Complications: No apparent anesthesia complications

## 2012-01-28 NOTE — Anesthesia Preprocedure Evaluation (Addendum)
Anesthesia Evaluation  Patient identified by MRN, date of birth, ID band Patient awake    Reviewed: Allergy & Precautions, H&P , NPO status , Patient's Chart, lab work & pertinent test results  History of Anesthesia Complications Negative for: history of anesthetic complications  Airway Mallampati: I TM Distance: >3 FB Neck ROM: Full    Dental No notable dental hx. (+) Teeth Intact and Dental Advisory Given   Pulmonary  breath sounds clear to auscultation  Pulmonary exam normal       Cardiovascular + CAD (non-obstructive ASCADz by cath) Rhythm:Regular Rate:Normal  '11 stress test: EF 61%, no ischemia '11 ECHO: normal LVF, valves OK   Neuro/Psych    GI/Hepatic negative GI ROS, Neg liver ROS,   Endo/Other  Morbid obesity  Renal/GU Renal diseasenegative Renal ROS     Musculoskeletal   Abdominal (+) + obese,   Peds  Hematology   Anesthesia Other Findings   Reproductive/Obstetrics                          Anesthesia Physical Anesthesia Plan  ASA: II  Anesthesia Plan: General   Post-op Pain Management:    Induction: Intravenous  Airway Management Planned: Oral ETT  Additional Equipment:   Intra-op Plan:   Post-operative Plan: Extubation in OR  Informed Consent: I have reviewed the patients History and Physical, chart, labs and discussed the procedure including the risks, benefits and alternatives for the proposed anesthesia with the patient or authorized representative who has indicated his/her understanding and acceptance.   Dental advisory given  Plan Discussed with: CRNA and Surgeon  Anesthesia Plan Comments: (Plan routine monitors, GETA)        Anesthesia Quick Evaluation

## 2012-01-28 NOTE — Anesthesia Procedure Notes (Signed)
Procedure Name: Intubation Date/Time: 01/28/2012 2:56 PM Performed by: Lovie Chol Pre-anesthesia Checklist: Patient identified, Emergency Drugs available, Suction available, Patient being monitored and Timeout performed Patient Re-evaluated:Patient Re-evaluated prior to inductionOxygen Delivery Method: Circle system utilized Preoxygenation: Pre-oxygenation with 100% oxygen Intubation Type: IV induction Ventilation: Mask ventilation without difficulty Laryngoscope Size: Miller and 2 Grade View: Grade I Tube type: Oral Tube size: 7.5 mm Number of attempts: 1 Airway Equipment and Method: Stylet and LTA kit utilized Placement Confirmation: ETT inserted through vocal cords under direct vision,  positive ETCO2 and breath sounds checked- equal and bilateral Secured at: 22 cm Tube secured with: Tape Dental Injury: Teeth and Oropharynx as per pre-operative assessment

## 2012-01-28 NOTE — Op Note (Signed)
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 Asst.: Delma Officer M.D. Indications: Patient is a 66 year old individual is had weakness in the left upper extremity in a C8 distribution is evidence of significant spondylosis and foraminal overgrowth on the left sides at C6-7 and C7-T1 also has spondylosis at C4-5 and C5-6 these do not appear to be related to his current symptom is been advised regarding the need for surgical decompression and stabilization at 2 levels involved Procedure: The patient was brought to the operating room placed on the table in supine position. After the smooth induction of general endotracheal anesthesia neck was placed in 5 pounds of halter traction and prepped with alcohol and DuraPrep. After sterile draping and appropriate timeout procedure a transverse incision was created in the left side of the neck and carried down to the platysma. The plane between the sternocleidomastoid and strap muscles dissected bluntly until the prevertebral space was reached. The first identifiable disc space was noted to be C5-6 on a localizing radiograph. The dissection was then undertaken in the longus coli muscle to allow placement of a self-retaining Caspar type retractor.  The anterior longitudinal ligament was opened at C6-C7 and ventral osteophytes were removed with a Leksell rongeur and Kerrison punch. Interspace was cleared of significant quantity of the degenerated disc material from the  region of the posterior longitudinal ligament was reached. Dissection was carried out using a high-speed drill and 3-0 Karlin curettes. Uncinate  processes were drilled down and removed and osteophytes from the inferior margin of the body of C6 were removed with a Kerrison 2 mm gold punch. After the central canal and lateral recesses were well decompressed the stasis was achieved with the bipolar cautery and some small pledgets of Gelfoam soaked in thrombin that were later irrigated away.  A 6 mm transgraft was then prepared by enlarging the central cavity and filling with demineralized bone matrix and placing into the interspace.C7 T1Was decompressed and fused in a similar fashion.  Next the retractor was removed and a 32 mm trestle plate was placed over the vertebral bodies and secured with 14 mm variable angle screws. A final localizing radiograph identified the position of the surgical construct. Hemostasis was achieved in the soft tissues and then the platysma was closed with 3-0 Vicryl in an interrupted fashion and 3-0 Vicryl was used in the subcuticular tissue. Blood loss was estimated at50cc.

## 2012-01-28 NOTE — H&P (Signed)
Casey Castillo is an 66 y.o. male.   Chief Complaint: Neck shoulder and left arm pain HPI: CHIEF COMPLAINT:   Pinched nerve in neck causing left arm numbness.    HISTORY OF PRESENT ILLNESS:  Mr. Casey Castillo is a 66 year old, left-handed individual who is retired.  He tells me that he started developing some symptoms of pain, numbness and tingling in his left arm that has been going on for 6 months or perhaps a little less.  I had previously seen him for symptoms related to a herniated nucleus pulposus at L3-L4 which resolved spontaneously.  He is sent here via the courtesy of Dr. Eulah Pont as it is felt that he has a cervical radicular problem.    PAST MEDICAL HISTORY:  He has generally experienced good health.  He attaches a sheet with every procedure that he has had done over the number of a years and his complete medical history.  That is reviewed and will not be summarized in this note.    CURRENT MEDICATIONS:  His current medications that he is using for discomfort include Naproxen 500 mg twice a day.  It doesn't seem to be having much of an effect.    PERSONAL HISTORY:   He does not smoke.  He does not drink alcohol.  Height and weight have been stable at 5', 10" and 215 lbs.    REVIEW OF SYSTEMS:   Systems review is notable for using reading glasses, some arm pain as the only two symptoms on a 14-point review sheet noted in the office today.    PHYSICAL EXAMINATION:  He is an alert and oriented individual.  He turns 80 degrees to either side without difficulty.  He flexes and extends nearly normally.  Axial compression does not reproduce any overt symptoms.  There is no tenderness to palpation of the supraclavicular fossas and I note that his motor strength is good in the deltoids, the biceps, the triceps, the grips and the intrinsics.  Namely, he does not have any atrophy in the small muscles of the hand.  He does not have any Tinel's across the region of the elbow either.    DIAGNOSTIC  STUDIES:   Review of plain radiographs that he brings with him from the [facility] office demonstrate that he has moderate spondylitic changes in the   Casey Castillo  #[id number]  DOB:  1945/12/23     December 26, 2011  Page Two   lower cervical spine, although C7-T1 is difficult to visualize in the lateral projection because of his shoulders.  C6-C7 disc has some mild to moderate degenerative changes noted.    IMPRESSION:    The patient has evidence of a left C8 radiculopathy by his history and clinical exam.  His motor strength appears intact.  At this point, he needs an MRI of the cervical spine to better delineate any pathoanatomy.  He is interested in getting something definitive done as this process has been ongoing and he doesn't seem to be getting better.  We can make better judgment and make a plan for him once we have an MRI of his cervical spine to discuss if and what kind of surgery may be available to help him with his pain problem.    Past Medical History  Diagnosis Date  . Hyperlipidemia     primary Dr. Evette Cristal  . Enlarged prostate     takes flomax  . Urinary frequency     hx of   .  History of blood clots     november, 5 1995  . Diverticulitis     hx of  . Coronary artery disease     5%blockage on left anterior descending Dr. Royann Shivers   . Kidney stone     hx of sees Dr. Kathie Rhodes. Rito Ehrlich    Past Surgical History  Procedure Date  . Cardiac catheterization     09-06-11   . Cardiovascular stress test     2006 and 2008 at [facility] heart [facility] vascular, 2011 myoview and echo  . Pilonidal cyst / sinus excision     surgery 01-08-74  . Kidney stone surgery     lithotripsy 1980's  . Knee surgery     left 04-11-94 ( has had two surgeries to left knee)  . Trigger finger release     03-21-98 right hand ring finger  . Nasal sinus surgery     Jun 30 1998  . Shoulder surgery     right 12-20-2003  . Hemorrhoid surgery     sept 8, 2008    History reviewed. No pertinent family  history. Social History:  reports that he has quit smoking. He does not have any smokeless tobacco history on file. He reports that he does not drink alcohol or use illicit drugs.  Allergies: No Known Allergies  Medications Prior to Admission  Medication Sig Dispense Refill  . aspirin EC 81 MG tablet Take 81 mg by mouth daily.      . Cholecalciferol (VITAMIN D) 2000 UNITS CAPS Take 1 capsule by mouth daily.      Marland Kitchen loratadine (CLARITIN) 10 MG tablet Take 10 mg by mouth daily.      . Multiple Vitamins-Minerals (MULTIVITAMINS THER. W/MINERALS) TABS Take 1 tablet by mouth daily.      . Omega-3 Fatty Acids (FISH OIL) 1200 MG CAPS Take 1 capsule by mouth daily.      . simvastatin (ZOCOR) 40 MG tablet Take 40 mg by mouth daily.      . tadalafil (CIALIS) 5 MG tablet Take 5 mg by mouth daily as needed.      . Tamsulosin HCl (FLOMAX) 0.4 MG CAPS Take 0.4 mg by mouth daily.      . vitamin A 8000 UNIT capsule Take 8,000 Units by mouth every other day.        Results for orders placed during the hospital encounter of 01/28/12 (from the past 48 hour(s))  TYPE AND SCREEN     Status: Normal   Collection Time   01/28/12 11:25 AM      Component Value Range Comment   ABO/RH(D) O POS      Antibody Screen NEG      Sample Expiration 01/31/2012     ABO/RH     Status: Normal   Collection Time   01/28/12 11:25 AM      Component Value Range Comment   ABO/RH(D) O POS      No results found.  Review of Systems  Constitutional: Negative.   HENT: Positive for neck pain.   Eyes: Negative.   Respiratory: Negative.   Cardiovascular: Negative.   Gastrointestinal: Negative.   Genitourinary: Negative.   Skin: Negative.   Neurological: Positive for focal weakness.  Endo/Heme/Allergies: Negative.   Psychiatric/Behavioral: Negative.     Blood pressure 135/73, pulse 63, temperature 97.8 F (36.6 C), temperature source Oral, resp. rate 18, SpO2 95.00%. Physical Exam  Constitutional: He appears well-developed  and well-nourished.  HENT:  Head: Normocephalic and atraumatic.  Eyes: Conjunctivae and EOM are normal. Pupils are equal, round, and reactive to light.  Neck:       Decreased range of motion by 50%  Cardiovascular: Normal rate, regular rhythm and normal heart sounds.   Respiratory: Effort normal and breath sounds normal.  Neurological: He displays abnormal reflex.       Weakness of left intrinsics and triceps muscle for a 5  Skin: Skin is warm and dry.  Psychiatric: He has a normal mood and affect. His behavior is normal. Judgment and thought content normal.     Assessment/Plan Spondylosis with radiculopathy C6-7 C7-T1.  Anterior cervical decompression C6-7 C7-T1.  Dawnisha Marquina J 01/28/2012, 2:32 PM

## 2012-01-28 NOTE — Plan of Care (Signed)
Problem: Consults Goal: Diagnosis - Spinal Surgery Outcome: Completed/Met Date Met:  01/28/12 Cervical Spine Fusion     

## 2012-01-28 NOTE — Anesthesia Postprocedure Evaluation (Signed)
  Anesthesia Post-op Note  Patient: Casey Castillo  Procedure(s) Performed: Procedure(s) (LRB): ANTERIOR CERVICAL DECOMPRESSION/DISCECTOMY FUSION 2 LEVELS (N/A)  Patient Location: PACU  Anesthesia Type: General  Level of Consciousness: awake and alert   Airway and Oxygen Therapy: Patient Spontanous Breathing and Patient connected to nasal cannula oxygen  Post-op Pain: mild  Post-op Assessment: Post-op Vital signs reviewed, Patient's Cardiovascular Status Stable, Respiratory Function Stable, Patent Airway and No signs of Nausea or vomiting  Post-op Vital Signs: Reviewed and stable  Complications: No apparent anesthesia complications

## 2012-01-28 NOTE — Preoperative (Signed)
Beta Blockers   Reason not to administer Beta Blockers:Not Applicable 

## 2012-01-29 MED ORDER — OXYCODONE-ACETAMINOPHEN 5-325 MG PO TABS: 1.0000 | ORAL_TABLET | ORAL | Status: AC | PRN

## 2012-01-29 MED ORDER — DIAZEPAM 5 MG PO TABS: 5.0000 mg | ORAL_TABLET | Freq: Four times a day (QID) | ORAL | Status: AC | PRN

## 2012-01-29 NOTE — Progress Notes (Signed)
Patient ID: Casey Castillo, male   DOB: 1946/05/04, 66 y.o.   MRN: [id number] Decision is clean and dry motor function is good patient swallowing ambulating voiding without difficulties ready for discharge.

## 2012-01-29 NOTE — Progress Notes (Signed)
UR COMPLETED  

## 2012-01-29 NOTE — Discharge Summary (Signed)
  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 He tolerated the surgery well. He has not had any difficulty with swallowing. He has had some right C8 radiculopathy during the postoperative period.  He is discharged home at this time. Discharge medications: Percocet 5 mg #20 without refills Valium 5 mg #20 without refills

## 2012-01-31 ENCOUNTER — Encounter (HOSPITAL_COMMUNITY): Payer: Self-pay

## 2012-02-01 ENCOUNTER — Encounter (HOSPITAL_COMMUNITY): Payer: Self-pay | Admitting: Neurological Surgery

## 2012-02-07 ENCOUNTER — Other Ambulatory Visit: Payer: Self-pay | Admitting: Neurological Surgery

## 2012-02-07 DIAGNOSIS — M47812 Spondylosis without myelopathy or radiculopathy, cervical region: Secondary | ICD-10-CM

## 2012-02-12 ENCOUNTER — Ambulatory Visit
Admission: RE | Admit: 2012-02-12 | Discharge: 2012-02-12 | Disposition: A | Discharge status: Home / Self Care | Payer: Medicare Other | Source: Ambulatory Visit | Attending: Neurological Surgery | Admitting: Neurological Surgery

## 2012-02-12 DIAGNOSIS — M47812 Spondylosis without myelopathy or radiculopathy, cervical region: Secondary | ICD-10-CM

## 2012-12-04 ENCOUNTER — Other Ambulatory Visit: Payer: Self-pay

## 2012-12-04 ENCOUNTER — Encounter: Payer: Self-pay | Admitting: Surgery

## 2012-12-04 DIAGNOSIS — R609 Edema, unspecified: Secondary | ICD-10-CM

## 2012-12-07 ENCOUNTER — Encounter: Payer: Self-pay | Admitting: *Deleted

## 2012-12-24 ENCOUNTER — Encounter: Payer: Self-pay | Admitting: Cardiovascular Disease

## 2012-12-25 ENCOUNTER — Telehealth: Payer: Self-pay | Admitting: *Deleted

## 2012-12-25 ENCOUNTER — Ambulatory Visit (HOSPITAL_COMMUNITY)
Admission: RE | Admit: 2012-12-25 | Discharge: 2012-12-25 | Disposition: A | Discharge status: Home / Self Care | Payer: Medicare Other | Source: Ambulatory Visit | Attending: Cardiovascular Disease | Admitting: Cardiovascular Disease

## 2012-12-25 ENCOUNTER — Ambulatory Visit (INDEPENDENT_AMBULATORY_CARE_PROVIDER_SITE_OTHER): Payer: Medicare Other | Admitting: Cardiovascular Disease

## 2012-12-25 ENCOUNTER — Encounter: Payer: Self-pay | Admitting: Cardiovascular Disease

## 2012-12-25 VITALS — BP 118/80 | HR 67 | Resp 20 | Ht 70.0 in | Wt 228.2 lb

## 2012-12-25 DIAGNOSIS — R609 Edema, unspecified: Secondary | ICD-10-CM

## 2012-12-25 DIAGNOSIS — R600 Localized edema: Secondary | ICD-10-CM

## 2012-12-25 DIAGNOSIS — I2510 Atherosclerotic heart disease of native coronary artery without angina pectoris: Secondary | ICD-10-CM

## 2012-12-25 DIAGNOSIS — I82409 Acute embolism and thrombosis of unspecified deep veins of unspecified lower extremity: Secondary | ICD-10-CM

## 2012-12-25 DIAGNOSIS — I824Z9 Acute embolism and thrombosis of unspecified deep veins of unspecified distal lower extremity: Secondary | ICD-10-CM | POA: Insufficient documentation

## 2012-12-25 DIAGNOSIS — M7989 Other specified soft tissue disorders: Secondary | ICD-10-CM | POA: Insufficient documentation

## 2012-12-25 DIAGNOSIS — E785 Hyperlipidemia, unspecified: Secondary | ICD-10-CM

## 2012-12-25 MED ORDER — WARFARIN SODIUM 5 MG PO TABS: 5.0000 mg | ORAL_TABLET | Freq: Every day | ORAL | Status: DC

## 2012-12-25 NOTE — Progress Notes (Signed)
Bilateral Lower Ext. Venous Completed. Preliminary report by tech... Acute appearing DVT was noted in the left PTV. Marilynne Halsted, RDMS, RVT

## 2012-12-25 NOTE — Patient Instructions (Addendum)
Start the warfarin 5mg  once daily in the evening.  We will do your first INR check next week on Tuesday or Wednesday.  Stop furosemide.

## 2012-12-26 ENCOUNTER — Encounter: Payer: Self-pay | Admitting: Cardiovascular Disease

## 2012-12-26 DIAGNOSIS — I2510 Atherosclerotic heart disease of native coronary artery without angina pectoris: Secondary | ICD-10-CM | POA: Insufficient documentation

## 2012-12-26 DIAGNOSIS — E785 Hyperlipidemia, unspecified: Secondary | ICD-10-CM | POA: Insufficient documentation

## 2012-12-26 DIAGNOSIS — Z86718 Personal history of other venous thrombosis and embolism: Secondary | ICD-10-CM | POA: Insufficient documentation

## 2012-12-26 NOTE — Assessment & Plan Note (Addendum)
Mr. Casey Castillo appears to have a new acute thrombus in the infrapopliteal veins in the same left lower limb where he had a postsurgical DVT in 2005. We discussed the options of conventional warfarin anticoagulation versus a novel anticoagulant. I do not think that heparin is immediately necessary since the clot is entirely infrapopliteal. He does not have a history of complications with warfarin in the past. He prefers warfarin because of cost issues. We will bring him back in a few weeks for repeat duplex ultrasound. Since the furosemide is only prescribed for the edema I believe it can be stopped. I have recommended that he stop using the compression device until his followup appointment. Since there is no obvious precipitant cause for the current DVT he may need to stay on lifelong warfarin anticoagulation to prevent DVT recurrence.

## 2012-12-26 NOTE — Assessment & Plan Note (Signed)
He has no symptoms of acute coronary insufficiency. He had a normal nuclear scintigraphy study in 2011. Coronary angio in 2006 showed minimal luminal irregularities in the LAD artery without any true obstruction. Focus should be on risk factor modification

## 2012-12-26 NOTE — Progress Notes (Signed)
Patient ID: Casey Castillo, male   DOB: July 22, 1945, 67 y.o.   MRN: [id number]     Reason for office visit Lower extremity edema  Mr. Frett is a 67 year old man with a history of dyslipidemia and minimal coronary atherosclerosis by previous cardiac catheterization who presents with complaints of lower extremity edema. His other significant history includes nephrolithiasis and prostate enlargement.  He presents with a couple of months of lower extremity edema. This involves both feet and calves but is noticeably worse in the last. He actually had tried using compression stockings but these didn't help. He borrowed a sequential compression device from his uncle and this has led to resolution of the edema overnight but it always returns the next day.  Furosemide has not made any difference.  As time has passed on the left lower shunting swelling is clearly becoming dominant. He denies any pain or difficulty walking and has not had any shortness of breath cough or hemoptysis.    He has a history of previous deep venous thrombosis of the left lower extremity in 1995 when he underwent arthroscopic knee surgery. He took warfarin for about 6 weeks and then this was discontinued.  Performed an in office lower showed a venous duplex ultrasound and this shows evidence of infrapopliteal acute thrombus in the left calf especially at the level of the posterior tibial vein. There may be some component of chronic organized thrombus as well.  No Known Allergies  Current Outpatient Prescriptions  Medication Sig Dispense Refill  . aspirin EC 81 MG tablet Take 81 mg by mouth daily.      . Cholecalciferol (VITAMIN D) 2000 UNITS CAPS Take 1 capsule by mouth daily.      . furosemide (LASIX) 40 MG tablet Take 40 mg by mouth daily.      Marland Kitchen loratadine (CLARITIN) 10 MG tablet Take 10 mg by mouth daily.      . Multiple Vitamins-Minerals (MULTIVITAMINS THER. W/MINERALS) TABS Take 1 tablet by mouth daily.      . Omega-3  Fatty Acids (FISH OIL) 1200 MG CAPS Take 1 capsule by mouth daily.      . simvastatin (ZOCOR) 40 MG tablet Take 40 mg by mouth daily.      . tadalafil (CIALIS) 5 MG tablet Take 5 mg by mouth daily as needed.      . Tamsulosin HCl (FLOMAX) 0.4 MG CAPS Take 0.4 mg by mouth daily.      Marland Kitchen triamcinolone cream (KENALOG) 0.1 % Apply 1 application topically 2 (two) times daily as needed.      . vitamin A 8000 UNIT capsule Take 8,000 Units by mouth every other day.      . warfarin (COUMADIN) 5 MG tablet Take 1 tablet (5 mg total) by mouth daily.  30 tablet  3   No current facility-administered medications for this visit.    Past Medical History  Diagnosis Date  . Hyperlipidemia     primary Dr. Evette Cristal  . Enlarged prostate     takes flomax  . Urinary frequency     hx of   . History of blood clots     november, 5 1995  . Diverticulitis     hx of  . Coronary artery disease     5%blockage on left anterior descending Dr. Royann Shivers   . Kidney stone     hx of sees Dr. Kathie Rhodes. Rito Ehrlich    Past Surgical History  Procedure Laterality Date  . Cardiac catheterization  09-06-11   . Cardiovascular stress test      2006 and 2008 at [facility] heart and vascular, 2011 myoview and echo  . Pilonidal cyst / sinus excision      surgery 01-08-74  . Kidney stone surgery      lithotripsy 1980's  . Knee surgery      left 04-11-94 ( has had two surgeries to left knee)  . Trigger finger release      03-21-98 right hand ring finger  . Nasal sinus surgery      Jun 30 1998  . Shoulder surgery      right 12-20-2003  . Hemorrhoid surgery      sept 8, 2008  . Anterior cervical decomp/discectomy fusion  01/28/2012    Procedure: ANTERIOR CERVICAL DECOMPRESSION/DISCECTOMY FUSION 2 LEVELS;  Surgeon: Barnett Abu, MD;  Location: [facility];  Service: Neurosurgery;  Laterality: N/A;  Cervical six-seven, cervical seven-thoracic one Anterior cervical decompression/diskectomy/fusion  . Nm myoview ltd  07/20/2009     no ischemia  . US echocardiography  07/20/2009    LA & RA mildly dilated,trace MR,TR.    Family History  Problem Relation Age of Onset  . Heart attack Father     History   Social History  . Marital Status: Widowed    Spouse Name: N/A    Number of Children: N/A  . Years of Education: N/A   Occupational History  . Not on file.   Social History Main Topics  . Smoking status: Former Games developer  . Smokeless tobacco: Not on file     Comment: stopped 35 years ago  . Alcohol Use: No  . Drug Use: No  . Sexually Active:    Other Topics Concern  . Not on file   Social History Narrative  . No narrative on file    Review of systems: The patient specifically denies any chest pain at rest or with exertion, dyspnea at rest or with exertion, orthopnea, paroxysmal nocturnal dyspnea, syncope, palpitations, focal neurological deficits, intermittent claudication, unexplained weight gain, cough, hemoptysis or wheezing.  The patient also denies abdominal pain, nausea, vomiting, dysphagia, diarrhea, constipation, polyuria, polydipsia, dysuria, hematuria, frequency, urgency, abnormal bleeding or bruising, fever, chills, unexpected weight changes, mood swings, change in skin or hair texture, change in voice quality, auditory or visual problems, allergic reactions or rashes, new musculoskeletal complaints other than usual "aches and pains".   PHYSICAL EXAM BP 118/80  Pulse 67  Resp 20  Ht 5\' 10"  (1.778 m)  Wt 228 lb 3.2 oz (103.511 kg)  BMI 32.74 kg/m2  General: Alert, oriented x3, no distress Head: no evidence of trauma, PERRL, EOMI, no exophtalmos or lid lag, no myxedema, no xanthelasma; normal ears, nose and oropharynx Neck: normal jugular venous pulsations and no hepatojugular reflux; brisk carotid pulses without delay and no carotid bruits Chest: clear to auscultation, no signs of consolidation by percussion or palpation, normal fremitus, symmetrical and full respiratory  excursions Cardiovascular: normal position and quality of the apical impulse, regular rhythm, normal first and second heart sounds, no murmurs, rubs or gallops Abdomen: no tenderness or distention, no masses by palpation, no abnormal pulsatility or arterial bruits, normal bowel sounds, no hepatosplenomegaly Extremities: no clubbing, cyanosis; 2+ radial, ulnar and brachial pulses bilaterally; 2+ right femoral, posterior tibial and dorsalis pedis pulses; 2+ left femoral, posterior tibial and dorsalis pedis pulses; no subclavian or femoral bruits There is 2+ soft pitting edema of the left foot and calf and 1+ edema around the  right ankle. Cords are not palpated Neurological: grossly nonfocal   EKG: NSR  BMET    Component Value Date/Time   NA 141 01/22/2012 1525   K 3.7 01/22/2012 1525   CL 103 01/22/2012 1525   CO2 30 01/22/2012 1525   GLUCOSE 115* 01/22/2012 1525   BUN 14 01/22/2012 1525   CREATININE 1.19 01/22/2012 1525   CALCIUM 9.3 01/22/2012 1525   GFRNONAA 62* 01/22/2012 1525   GFRAA 72* 01/22/2012 1525     ASSESSMENT AND PLAN DVT of lower limb, acute Mr. Zonia Kief appears to have a new acute thrombus in the infrapopliteal veins in the same left lower limb where he had a postsurgical DVT in 2005. We discussed the options of conventional warfarin anticoagulation versus a novel anticoagulant. I do not think that heparin is immediately necessary since the clot is entirely infrapopliteal. He does not have a history of complications with warfarin in the past. He prefers warfarin because of cost issues. We will bring him back in a few weeks for repeat duplex ultrasound. Since the furosemide is only prescribed for the edema I believe it can be stopped. I have recommended that he stop using the compression device until his followup appointment. Since there is no obvious precipitant cause for the current DVT he may need to stay on lifelong warfarin anticoagulation to prevent DVT  recurrence.  Dyslipidemia Most recent lipid profile that I have on file is from October and showed a total cholesterol of 250 trig was present 188 HDL 38 LDL 82. No changes are made to his lipid-lowering medicines. He is encouraged to lose the weight that he has gained in the last year  Coronary atherosclerosis without stenosis He has no symptoms of acute coronary insufficiency. He had a normal nuclear scintigraphy study in 2011. Coronary angio in 2006 showed minimal luminal irregularities in the LAD artery without any true obstruction. Focus should be on risk factor modification   Orders Placed This Encounter  Procedures  . EKG 12-Lead  . Lower Extremity Venous Duplex Bilateral   Meds ordered this encounter  Medications  . warfarin (COUMADIN) 5 MG tablet    Sig: Take 1 tablet (5 mg total) by mouth daily.    Dispense:  30 tablet    Refill:  3    Soren Pigman  Thurmon Fair, MD, [facility] [phone] office [phone] pager

## 2012-12-26 NOTE — Assessment & Plan Note (Addendum)
Most recent lipid profile that I have on file is from October and showed a total cholesterol of 250 trig was present 188 HDL 38 LDL 82. No changes are made to his lipid-lowering medicines. He is encouraged to lose the weight that he has gained in the last year

## 2012-12-30 ENCOUNTER — Ambulatory Visit (INDEPENDENT_AMBULATORY_CARE_PROVIDER_SITE_OTHER): Payer: Medicare Other | Admitting: Pharmacist Clinician (PhC)/ Clinical Pharmacy Specialist

## 2012-12-30 DIAGNOSIS — I82409 Acute embolism and thrombosis of unspecified deep veins of unspecified lower extremity: Secondary | ICD-10-CM

## 2012-12-30 DIAGNOSIS — Z7901 Long term (current) use of anticoagulants: Secondary | ICD-10-CM | POA: Insufficient documentation

## 2012-12-30 LAB — POCT INR: INR: 1.3

## 2012-12-30 MED ORDER — WARFARIN SODIUM 5 MG PO TABS: ORAL_TABLET | ORAL | Status: DC

## 2013-01-08 ENCOUNTER — Ambulatory Visit (INDEPENDENT_AMBULATORY_CARE_PROVIDER_SITE_OTHER): Payer: Medicare Other | Admitting: Pharmacist Clinician (PhC)/ Clinical Pharmacy Specialist

## 2013-01-08 DIAGNOSIS — I82409 Acute embolism and thrombosis of unspecified deep veins of unspecified lower extremity: Secondary | ICD-10-CM

## 2013-01-08 DIAGNOSIS — Z7901 Long term (current) use of anticoagulants: Secondary | ICD-10-CM

## 2013-01-08 LAB — POCT INR: INR: 2.3

## 2013-01-15 ENCOUNTER — Encounter: Payer: Self-pay | Admitting: Cardiovascular Disease

## 2013-01-15 ENCOUNTER — Ambulatory Visit (INDEPENDENT_AMBULATORY_CARE_PROVIDER_SITE_OTHER): Payer: Medicare Other | Admitting: Pharmacist Clinician (PhC)/ Clinical Pharmacy Specialist

## 2013-01-15 ENCOUNTER — Ambulatory Visit (INDEPENDENT_AMBULATORY_CARE_PROVIDER_SITE_OTHER): Payer: Medicare Other | Admitting: Cardiovascular Disease

## 2013-01-15 VITALS — BP 128/78 | HR 76 | Resp 16 | Ht 70.0 in | Wt 234.5 lb

## 2013-01-15 DIAGNOSIS — R600 Localized edema: Secondary | ICD-10-CM

## 2013-01-15 DIAGNOSIS — I2510 Atherosclerotic heart disease of native coronary artery without angina pectoris: Secondary | ICD-10-CM

## 2013-01-15 DIAGNOSIS — I82409 Acute embolism and thrombosis of unspecified deep veins of unspecified lower extremity: Secondary | ICD-10-CM

## 2013-01-15 DIAGNOSIS — I82402 Acute embolism and thrombosis of unspecified deep veins of left lower extremity: Secondary | ICD-10-CM

## 2013-01-15 DIAGNOSIS — Z7901 Long term (current) use of anticoagulants: Secondary | ICD-10-CM

## 2013-01-15 DIAGNOSIS — E785 Hyperlipidemia, unspecified: Secondary | ICD-10-CM

## 2013-01-15 DIAGNOSIS — R609 Edema, unspecified: Secondary | ICD-10-CM

## 2013-01-15 LAB — URINALYSIS
Bilirubin Urine: NEGATIVE
Hgb urine dipstick: NEGATIVE
Ketones, ur: NEGATIVE mg/dL
Protein, ur: NEGATIVE mg/dL
Urobilinogen, UA: 0.2 mg/dL (ref 0.0–1.0)

## 2013-01-15 LAB — POCT INR: INR: 1.8

## 2013-01-15 NOTE — Patient Instructions (Addendum)
Your physician has requested that you have an echocardiogram. Echocardiography is a painless test that uses sound waves to create images of your heart. It provides your doctor with information about the size and shape of your heart and how well your heart's chambers and valves are working. This procedure takes approximately one hour. There are no restrictions for this procedure.  Your physician has recommended you make the following change in your medication: Restart taking furosemide 40 mg once daily. Please increase your intake of potassium rich foods (fruits and vegetables such as bananas, berries, orange juice, etc.).   Your physician recommends that you have lab work today.  Your physician recommends that you schedule a follow-up appointment in: 1 month

## 2013-01-16 ENCOUNTER — Ambulatory Visit (HOSPITAL_COMMUNITY): Payer: Medicare Other

## 2013-01-16 LAB — COMPREHENSIVE METABOLIC PANEL
ALT: 17 U/L (ref 0–53)
AST: 17 U/L (ref 0–37)
Albumin: 4.2 g/dL (ref 3.5–5.2)
Alkaline Phosphatase: 50 U/L (ref 39–117)
Glucose, Bld: 90 mg/dL (ref 70–99)
Potassium: 4.3 mEq/L (ref 3.5–5.3)
Sodium: 141 mEq/L (ref 135–145)
Total Protein: 6.7 g/dL (ref 6.0–8.3)

## 2013-01-17 ENCOUNTER — Encounter: Payer: Self-pay | Admitting: Cardiovascular Disease

## 2013-01-17 DIAGNOSIS — R600 Localized edema: Secondary | ICD-10-CM | POA: Insufficient documentation

## 2013-01-17 NOTE — Assessment & Plan Note (Signed)
The etiology of this is unclear, it is hard to blame it on peripheral venous insufficiency. 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 He does not have obvious varicose veins. We will screen for kidney, liver and cardiac disease that might cause his edema. Restart diuretics. If no clues become evident, consider right heart catheterization.

## 2013-01-17 NOTE — Assessment & Plan Note (Signed)
It may actually be the case that he has left lower extremity postphlebitic syndrome but became obvious only when he developed systemic hypervolemia. The ultrasound report does suggest that there is some acute component of thrombus, at least for the time being he should remain on anticoagulants. There is no clinical evidence to suggest embolism or pulmonary hypertension.

## 2013-01-17 NOTE — Progress Notes (Signed)
Patient ID: Casey Castillo, male   DOB: [birth year], 67 y.o.   MRN: [id number]      Reason for office visit Lower extremity edema, unilateral left infrapopliteal DVT  Casey Castillo is a 67 year old man with a history of dyslipidemia and minimal coronary atherosclerosis by previous cardiac catheterization who presents with complaints of lower extremity edema. Casey Castillo other significant history includes nephrolithiasis and prostate enlargement.   Casey Castillo presents with a couple of months of lower extremity edema. This involves both feet and calves but is noticeably worse in the left. Casey Castillo actually had tried using compression stockings but these didn't help. Casey Castillo borrowed a sequential compression device from Casey Castillo uncle and this has led to resolution of the edema overnight but it always returns the next day. Furosemide has not made any difference. As time has passed on the left lower extremity swellingwass clearly becoming dominant. Casey Castillo denies any pain or difficulty walking and has not had any shortness of breath cough or hemoptysis. Lotrimin the duplex ultrasonography showed infrapopliteal acute and partly organized DVT in the left lower extremity.   We stopped Casey Castillo diuretics and began anticoagulation therapy. Subsequently Casey Castillo began again to experience worsening swelling in both Casey Castillo legs, although left lower extremity edema is still dominant. Casey Castillo has gained 12 pounds in less than a month.  Casey Castillo has a history of previous deep venous thrombosis of the left lower extremity in 1995 when Casey Castillo underwent arthroscopic knee surgery. Casey Castillo took warfarin for about 6 weeks and then this was discontinued.   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    No Known Allergies  Current Outpatient Prescriptions  Medication Sig Dispense Refill  . aspirin EC 81 MG tablet Take 81 mg by mouth daily.      . Cholecalciferol (VITAMIN D) 2000 UNITS CAPS Take 1 capsule by mouth daily.      Marland Kitchen  loratadine (CLARITIN) 10 MG tablet Take 10 mg by mouth daily.      . Multiple Vitamins-Minerals (MULTIVITAMINS THER. W/MINERALS) TABS Take 1 tablet by mouth daily.      . Omega-3 Fatty Acids (FISH OIL) 1200 MG CAPS Take 1 capsule by mouth daily.      . simvastatin (ZOCOR) 40 MG tablet Take 40 mg by mouth daily.      . tadalafil (CIALIS) 5 MG tablet Take 5 mg by mouth daily as needed.      . Tamsulosin HCl (FLOMAX) 0.4 MG CAPS Take 0.4 mg by mouth daily.      . vitamin A 8000 UNIT capsule Take 8,000 Units by mouth every other day.      . warfarin (COUMADIN) 5 MG tablet Take 1 to 1.5 tablets by mouth daily as directed  120 tablet  0   No current facility-administered medications for this visit.    Past Medical History  Diagnosis Date  . Hyperlipidemia     primary Dr. Evette Cristal  . Enlarged prostate     takes flomax  . Urinary frequency     hx of   . History of blood clots     november, 5 1995  . Diverticulitis     hx of  . Coronary artery disease     5%blockage on left anterior descending Dr. Royann Shivers   . Kidney stone     hx of sees Dr. Kathie Rhodes. Rito Ehrlich    Past Surgical History  Procedure Laterality Date  . Cardiac catheterization  09-06-11   . Cardiovascular stress test      2006 and 2008 at [facility] heart and vascular, 2011 myoview and echo  . Pilonidal cyst / sinus excision      surgery 01-08-74  . Kidney stone surgery      lithotripsy 1980's  . Knee surgery      left 04-11-94 ( has had two surgeries to left knee)  . Trigger finger release      03-21-98 right hand ring finger  . Nasal sinus surgery      Jun 30 1998  . Shoulder surgery      right 12-20-2003  . Hemorrhoid surgery      sept 8, 2008  . Anterior cervical decomp/discectomy fusion  01/28/2012    Procedure: ANTERIOR CERVICAL DECOMPRESSION/DISCECTOMY FUSION 2 LEVELS;  Surgeon: Barnett Abu, MD;  Location: [facility];  Service: Neurosurgery;  Laterality: N/A;  Cervical six-seven, cervical seven-thoracic one  Anterior cervical decompression/diskectomy/fusion  . Nm myoview ltd  07/20/2009    no ischemia  . US echocardiography  07/20/2009    LA & RA mildly dilated,trace MR,TR.    Family History  Problem Relation Age of Onset  . Heart attack Father     History   Social History  . Marital Status: Widowed    Spouse Name: N/A    Number of Children: N/A  . Years of Education: N/A   Occupational History  . Not on file.   Social History Main Topics  . Smoking status: Former Games developer  . Smokeless tobacco: Not on file     Comment: stopped 35 years ago  . Alcohol Use: No  . Drug Use: No  . Sexually Active:    Other Topics Concern  . Not on file   Social History Narrative  . No narrative on file    Review of systems: Casey Castillo denies shortness of breath either at rest or with exertion. Casey Castillo legs are not painful. Casey Castillo denies chest pain, cough, hemoptysis. Has not noticed any change in Casey Castillo urine output does not have dysuria or hematuria. Casey Castillo denies problems with abdominal pain or anorexia. Casey Castillo does not have history of liver or kidney problems other than the kidney stones. Casey Castillo has noted substantial weight gain along with the edema. Casey Castillo denies focal cortical deficits or intimate claudication or erectile dysfunction. Casey Castillo has not had nausea, vomiting, dysphagia, change in bowel pattern. Casey Castillo denies mood problems. Has not had skin rashes. Casey Castillo denies intolerance to heat or cold, polyuria, polydipsia.  PHYSICAL EXAM BP 128/78  Pulse 76  Resp 16  Ht 5\' 10"  (1.778 m)  Wt 234 lb 8 oz (106.369 kg)  BMI 33.65 kg/m2  General: Alert, oriented x3, no distress Head: no evidence of trauma, PERRL, EOMI, no exophtalmos or lid lag, no myxedema, no xanthelasma; normal ears, nose and oropharynx Neck: normal jugular venous pulsations and no hepatojugular reflux; brisk carotid pulses without delay and no carotid bruits Chest: clear to auscultation, no signs of consolidation by percussion or palpation, normal fremitus, symmetrical  and full respiratory excursions Cardiovascular: normal position and quality of the apical impulse, regular rhythm, normal first and second heart sounds, no murmurs, rubs or gallops Abdomen: no tenderness or distention, no masses by palpation, no abnormal pulsatility or arterial bruits, normal bowel sounds, no hepatosplenomegaly Extremities: no clubbing, cyanosis , there is bilateral pitting edema 3+ on the left and 2+ on the right, ; 2+ radial, ulnar and brachial pulses bilaterally; 2+ right femoral, posterior tibial and dorsalis  pedis pulses; 2+ left femoral, posterior tibial and dorsalis pedis pulses; no subclavian or femoral bruits Neurological: grossly nonfocal   EKG: Normal sinus rhythm  BMET    Component Value Date/Time   NA 141 01/15/2013 1519   K 4.3 01/15/2013 1519   CL 104 01/15/2013 1519   CO2 29 01/15/2013 1519   GLUCOSE 90 01/15/2013 1519   BUN 13 01/15/2013 1519   CREATININE 1.21 01/15/2013 1519   CREATININE 1.19 01/22/2012 1525   CALCIUM 9.2 01/15/2013 1519   GFRNONAA 62* 01/22/2012 1525   GFRAA 72* 01/22/2012 1525     ASSESSMENT AND PLAN DVT of lower limb, acute It may actually be the case that 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 The ultrasound report does suggest that there is some acute component of thrombus, at least for the time being Casey Castillo should remain on anticoagulants. There is no clinical evidence to suggest embolism or pulmonary hypertension.  Dyslipidemia When last checked Casey Castillo had normal total cholesterol and LDL level and had mild hypertriglyceridemia.  Coronary atherosclerosis without stenosis No symptoms of coronary insufficiency. Normal nuclear stress test 2011, ejection fraction 60%.  Edema of both legs The etiology of this is unclear, it is hard to blame it on peripheral venous insufficiency. 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 Casey Castillo does not have obvious varicose veins. We will screen for kidney, liver and cardiac disease that might cause Casey Castillo edema. Restart diuretics. If no clues become evident, consider right heart catheterization.   Orders Placed This Encounter  Procedures  . Comp Met (CMET)  . Urinalysis  . B Nat Peptide  . 2D Echocardiogram with contrast     Shadoe Cryan  Thurmon Fair, MD, [facility] [phone] office [phone] pager

## 2013-01-17 NOTE — Assessment & Plan Note (Signed)
No symptoms of coronary insufficiency. Normal nuclear stress test 2011, ejection fraction 60%.

## 2013-01-17 NOTE — Assessment & Plan Note (Signed)
When last checked he had normal total cholesterol and LDL level and had mild hypertriglyceridemia.

## 2013-01-19 ENCOUNTER — Encounter: Payer: Medicare Other | Admitting: Surgery

## 2013-01-22 ENCOUNTER — Ambulatory Visit (INDEPENDENT_AMBULATORY_CARE_PROVIDER_SITE_OTHER): Payer: Medicare Other | Admitting: Pharmacist Clinician (PhC)/ Clinical Pharmacy Specialist

## 2013-01-22 ENCOUNTER — Ambulatory Visit (HOSPITAL_COMMUNITY)
Admission: RE | Admit: 2013-01-22 | Discharge: 2013-01-22 | Disposition: A | Discharge status: Home / Self Care | Payer: Medicare Other | Source: Ambulatory Visit | Attending: Cardiovascular Disease | Admitting: Cardiovascular Disease

## 2013-01-22 DIAGNOSIS — E785 Hyperlipidemia, unspecified: Secondary | ICD-10-CM | POA: Insufficient documentation

## 2013-01-22 DIAGNOSIS — I82402 Acute embolism and thrombosis of unspecified deep veins of left lower extremity: Secondary | ICD-10-CM

## 2013-01-22 DIAGNOSIS — I82409 Acute embolism and thrombosis of unspecified deep veins of unspecified lower extremity: Secondary | ICD-10-CM

## 2013-01-22 DIAGNOSIS — R600 Localized edema: Secondary | ICD-10-CM

## 2013-01-22 DIAGNOSIS — Z87891 Personal history of nicotine dependence: Secondary | ICD-10-CM | POA: Insufficient documentation

## 2013-01-22 DIAGNOSIS — I2510 Atherosclerotic heart disease of native coronary artery without angina pectoris: Secondary | ICD-10-CM | POA: Insufficient documentation

## 2013-01-22 DIAGNOSIS — Z7901 Long term (current) use of anticoagulants: Secondary | ICD-10-CM

## 2013-01-22 DIAGNOSIS — Z86718 Personal history of other venous thrombosis and embolism: Secondary | ICD-10-CM | POA: Insufficient documentation

## 2013-01-22 DIAGNOSIS — R609 Edema, unspecified: Secondary | ICD-10-CM | POA: Insufficient documentation

## 2013-01-22 NOTE — Progress Notes (Signed)
[facility]   2D echo completed 01/22/2013.   Cindy Jaionna Weisse, RDCS  

## 2013-02-02 ENCOUNTER — Ambulatory Visit (INDEPENDENT_AMBULATORY_CARE_PROVIDER_SITE_OTHER): Payer: Medicare Other | Admitting: Pharmacist Clinician (PhC)/ Clinical Pharmacy Specialist

## 2013-02-02 VITALS — BP 110/70 | HR 76

## 2013-02-02 DIAGNOSIS — I82409 Acute embolism and thrombosis of unspecified deep veins of unspecified lower extremity: Secondary | ICD-10-CM

## 2013-02-02 DIAGNOSIS — Z7901 Long term (current) use of anticoagulants: Secondary | ICD-10-CM

## 2013-02-16 ENCOUNTER — Telehealth: Payer: Self-pay | Admitting: Cardiovascular Disease

## 2013-02-16 DIAGNOSIS — I82402 Acute embolism and thrombosis of unspecified deep veins of left lower extremity: Secondary | ICD-10-CM

## 2013-02-16 NOTE — Telephone Encounter (Signed)
Can we squeeze in a limited left LE DVT duplex US scan tomorrow?

## 2013-02-16 NOTE — Telephone Encounter (Signed)
Returned call.  Pt c/o redness and warmness on the L shin.  Stated redness started yesterday and warmness started today.  Also c/o tenderness in calf.  Stated he thought the clot was in the L calf, but isn't sure.  Stated the redness goes from his L knee to the ankle.  Pt informed he described s/s of DVT and they are expected.  Pt stated he never had this before yesterday and is concerned.  Pt has appt w/ Dr. Salena Saner tomorrow and wants to know if he needs to be seen sooner.  Pt informed symptoms are expected w/ DVT and provider will be notified as Dr. Salena Saner is out of the office until tomorrow.  Pt verbalized understanding and agreed w/ plan.  Dr. Rennis Golden notified and reviewed chart.  Advised pt keep appt tomorrow w/ Dr. Salena Saner.  Returned call and informed pt per instructions by MD/PA.  Pt verbalized understanding and agreed w/ plan.

## 2013-02-16 NOTE — Addendum Note (Signed)
Addended by: Beecher Mcardle R on: 02/16/2013 12:13 PM   Modules accepted: Orders

## 2013-02-16 NOTE — Telephone Encounter (Signed)
Test ordered.  Pt informed and able to come in at 1:30pm.  Appt scheduled per Deedie in scheduling.

## 2013-02-16 NOTE — Telephone Encounter (Signed)
Left leg where his blood clot is there in fever in that calf-Please call to advise.Casey Castillo

## 2013-02-17 ENCOUNTER — Ambulatory Visit (INDEPENDENT_AMBULATORY_CARE_PROVIDER_SITE_OTHER): Payer: Medicare Other | Admitting: Cardiovascular Disease

## 2013-02-17 ENCOUNTER — Encounter: Payer: Self-pay | Admitting: Cardiovascular Disease

## 2013-02-17 ENCOUNTER — Encounter (HOSPITAL_COMMUNITY): Payer: Medicare Other

## 2013-02-17 ENCOUNTER — Ambulatory Visit (HOSPITAL_COMMUNITY)
Admission: RE | Admit: 2013-02-17 | Discharge: 2013-02-17 | Disposition: A | Discharge status: Home / Self Care | Payer: Medicare Other | Source: Ambulatory Visit | Attending: Cardiovascular Disease | Admitting: Cardiovascular Disease

## 2013-02-17 ENCOUNTER — Ambulatory Visit (INDEPENDENT_AMBULATORY_CARE_PROVIDER_SITE_OTHER): Payer: Medicare Other | Admitting: Pharmacist Clinician (PhC)/ Clinical Pharmacy Specialist

## 2013-02-17 VITALS — BP 142/80 | HR 80 | Resp 16 | Ht 70.0 in | Wt 229.3 lb

## 2013-02-17 DIAGNOSIS — M79609 Pain in unspecified limb: Secondary | ICD-10-CM

## 2013-02-17 DIAGNOSIS — I82402 Acute embolism and thrombosis of unspecified deep veins of left lower extremity: Secondary | ICD-10-CM

## 2013-02-17 DIAGNOSIS — Z7901 Long term (current) use of anticoagulants: Secondary | ICD-10-CM

## 2013-02-17 DIAGNOSIS — I82409 Acute embolism and thrombosis of unspecified deep veins of unspecified lower extremity: Secondary | ICD-10-CM

## 2013-02-17 DIAGNOSIS — R600 Localized edema: Secondary | ICD-10-CM

## 2013-02-17 DIAGNOSIS — M7989 Other specified soft tissue disorders: Secondary | ICD-10-CM

## 2013-02-17 DIAGNOSIS — R609 Edema, unspecified: Secondary | ICD-10-CM

## 2013-02-17 DIAGNOSIS — I803 Phlebitis and thrombophlebitis of lower extremities, unspecified: Secondary | ICD-10-CM

## 2013-02-17 DIAGNOSIS — E785 Hyperlipidemia, unspecified: Secondary | ICD-10-CM

## 2013-02-17 LAB — POCT INR: INR: 2.7

## 2013-02-17 MED ORDER — FUROSEMIDE 40 MG PO TABS: 60.0000 mg | ORAL_TABLET | Freq: Every day | ORAL | Status: DC

## 2013-02-17 MED ORDER — POTASSIUM CHLORIDE CRYS ER 20 MEQ PO TBCR: 20.0000 meq | EXTENDED_RELEASE_TABLET | Freq: Every day | ORAL | Status: DC

## 2013-02-17 NOTE — Patient Instructions (Addendum)
Increase Furosemide to 40mg  1 1/2 tablets daily.  Start Potassium daily.  Your physician recommends that you schedule a follow-up appointment in:  3 months

## 2013-02-17 NOTE — Progress Notes (Signed)
Left Lower Ext. Venous Duplex Completed. A mild amount of residual thrombus was noted. Marilynne Halsted, BS, RDMS, RVT

## 2013-02-18 ENCOUNTER — Other Ambulatory Visit: Payer: Self-pay | Admitting: Pharmacist Clinician (PhC)/ Clinical Pharmacy Specialist

## 2013-02-18 MED ORDER — WARFARIN SODIUM 5 MG PO TABS: ORAL_TABLET | ORAL | Status: DC

## 2013-02-22 ENCOUNTER — Encounter: Payer: Self-pay | Admitting: Cardiovascular Disease

## 2013-02-22 NOTE — Assessment & Plan Note (Signed)
Most recent lipid profile showed total cholesterol of 145, triglycerides 213, HDL 35, LDL 67. I do not think he needs additional pharmacological therapy but needs to lose weight and exercise more.

## 2013-02-22 NOTE — Progress Notes (Signed)
Patient ID: Casey Castillo, male   DOB: 1946-04-17, 67 y.o.   MRN: [id number]     Reason for office visit Followup bilateral lower extremity edema and left infrapopliteal deep venous thrombosis  Arley is feeling better. He no longer has any complaints of shortness of breath or chest discomfort. His lower showed edema is a little better although it does persist and as before is worse in the left leg. He has developed an unusual maculopapular rash in both lower extremities and actually saw Ree Kida called who is a dermatologist recently. He does not recall the diagnosis to Dr. call call gave him. He has received what sounds like a steroid ointment for treatment.  Evaluation for causes of his bilateral lower showed edema has been unrevealing. He does not have severe liver disease or nephrotic syndrome. The left ventricle has normal systolic and diastolic function. There was no evidence of right heart disease. Does appear to have some peripheral venous insufficiency and this may be the only problem.  He is known to have nonobstructive coronary atherosclerosis by previous angiography and had a normal nuclear stress test most recently in 2011 notes that the cardiac cath performed in 2006 was performed secondary to an abnormal nuclear stress test (inferior wall ischemia) that ended up being a false positive study  No Known Allergies  Current Outpatient Prescriptions  Medication Sig Dispense Refill  . aspirin EC 81 MG tablet Take 81 mg by mouth daily.      . Cholecalciferol (VITAMIN D) 2000 UNITS CAPS Take 1 capsule by mouth daily.      . clobetasol cream (TEMOVATE) 0.05 % Apply 1 application topically 2 (two) times daily.      . furosemide (LASIX) 40 MG tablet Take 1.5 tablets (60 mg total) by mouth daily.  135 tablet  3  . loratadine (CLARITIN) 10 MG tablet Take 10 mg by mouth daily.      . Multiple Vitamins-Minerals (MULTIVITAMINS THER. W/MINERALS) TABS Take 1 tablet by mouth daily.      . Omega-3  Fatty Acids (FISH OIL) 1200 MG CAPS Take 1 capsule by mouth daily.      . simvastatin (ZOCOR) 40 MG tablet Take 40 mg by mouth daily.      . tadalafil (CIALIS) 5 MG tablet Take 5 mg by mouth daily as needed.      . Tamsulosin HCl (FLOMAX) 0.4 MG CAPS Take 0.4 mg by mouth daily.      . vitamin A 8000 UNIT capsule Take 8,000 Units by mouth every other day.      . potassium chloride SA (K-DUR,KLOR-CON) 20 MEQ tablet Take 1 tablet (20 mEq total) by mouth daily.  90 tablet  3  . warfarin (COUMADIN) 5 MG tablet Take 1 & 1/2 tablets by mouth daily or as directed  135 tablet  2   No current facility-administered medications for this visit.    Past Medical History  Diagnosis Date  . Hyperlipidemia     primary Dr. Evette Cristal  . Enlarged prostate     takes flomax  . Urinary frequency     hx of   . History of blood clots     november, 5 1995  . Diverticulitis     hx of  . Coronary artery disease     5%blockage on left anterior descending Dr. Royann Shivers   . Kidney stone     hx of sees Dr. Kathie Rhodes. Rito Ehrlich    Past Surgical History  Procedure Laterality  Date  . Cardiac catheterization      09-06-11   . Cardiovascular stress test      2006 and 2008 at [facility] heart and vascular, 2011 myoview and echo  . Pilonidal cyst / sinus excision      surgery 01-08-74  . Kidney stone surgery      lithotripsy 1980's  . Knee surgery      left 04-11-94 ( has had two surgeries to left knee)  . Trigger finger release      03-21-98 right hand ring finger  . Nasal sinus surgery      Jun 30 1998  . Shoulder surgery      right 12-20-2003  . Hemorrhoid surgery      sept 8, 2008  . Anterior cervical decomp/discectomy fusion  01/28/2012    Procedure: ANTERIOR CERVICAL DECOMPRESSION/DISCECTOMY FUSION 2 LEVELS;  Surgeon: Barnett Abu, MD;  Location: [facility];  Service: Neurosurgery;  Laterality: N/A;  Cervical six-seven, cervical seven-thoracic one Anterior cervical decompression/diskectomy/fusion  . Nm myoview  ltd  07/20/2009    no ischemia  . US echocardiography  07/20/2009    LA & RA mildly dilated,trace MR,TR.    Family History  Problem Relation Age of Onset  . Heart attack Father     History   Social History  . Marital Status: Widowed    Spouse Name: N/A    Number of Children: N/A  . Years of Education: N/A   Occupational History  . Not on file.   Social History Main Topics  . Smoking status: Former Games developer  . Smokeless tobacco: Not on file     Comment: stopped 35 years ago  . Alcohol Use: No  . Drug Use: No  . Sexual Activity:    Other Topics Concern  . Not on file   Social History Narrative  . No narrative on file    Review of systems: Rash and edema of both calfs, not tender. Denies cough, dyspnea, wheezing, hemoptysis, exertional dizziness presyncope. Denies polyuria polydipsia frequency urgency or hematuria. Denies abdominal pain gastrointestinal bleeding nausea vomiting dysphagia diarrhea or constipation no new muscle skeletal problem  PHYSICAL EXAM BP 142/80  Pulse 80  Resp 16  Ht 5\' 10"  (1.778 m)  Wt 229 lb 4.8 oz (104.01 kg)  BMI 32.9 kg/m2  General: Alert, oriented x3, no distress Head: no evidence of trauma, PERRL, EOMI, no exophtalmos or lid lag, no myxedema, no xanthelasma; normal ears, nose and oropharynx Neck: normal jugular venous pulsations and no hepatojugular reflux; brisk carotid pulses without delay and no carotid bruits Chest: clear to auscultation, no signs of consolidation by percussion or palpation, normal fremitus, symmetrical and full respiratory excursions Cardiovascular: normal position and quality of the apical impulse, regular rhythm, normal first and second heart sounds, no murmurs, rubs or gallops Abdomen: no tenderness or distention, no masses by palpation, no abnormal pulsatility or arterial bruits, normal bowel sounds, no hepatosplenomegaly Extremities: no clubbing, cyanosis; 2+ bilateral lower calf edema; 2+ radial, ulnar and  brachial pulses bilaterally; 2+ right femoral, posterior tibial and dorsalis pedis pulses; 2+ left femoral, posterior tibial and dorsalis pedis pulses; no subclavian or femoral bruits He has an unusual reticulated erythematous papular rash on both lower extremities that is reportedly improving following the treatment of Dr. Margo Aye prescribed Neurological: grossly nonfocal   EKG: Sinus rhythm, normal  Lipid Panel  No results found for this basename: chol, trig, hdl, cholhdl, vldl, ldlcalc    BMET    Component  Value Date/Time   NA 141 01/15/2013 1519   K 4.3 01/15/2013 1519   CL 104 01/15/2013 1519   CO2 29 01/15/2013 1519   GLUCOSE 90 01/15/2013 1519   BUN 13 01/15/2013 1519   CREATININE 1.21 01/15/2013 1519   CREATININE 1.19 01/22/2012 1525   CALCIUM 9.2 01/15/2013 1519   GFRNONAA 62* 01/22/2012 1525   GFRAA 72* 01/22/2012 1525     ASSESSMENT AND PLAN Edema of both legs Recommend conservative therapy for edema including leg elevation. He does does not have findings that suggest congestive heart failure  DVT of lower limb, acute 6 ultrasound performed today shows almost complete resolution of the previous infrapopliteal left lower extremity DVT. Would continue anticoagulation for a minimum of 6 months, preferably a year. Not had any bleeding complications.  Dyslipidemia Most recent lipid profile showed total cholesterol of 145, triglycerides 213, HDL 35, LDL 67. I do not think he needs additional pharmacological therapy but needs to lose weight and exercise more.   slightly increase the dose of furosemide to help with the edema. Potassium. Followup in 3 months  Meds ordered this encounter  Medications  . DISCONTD: furosemide (LASIX) 40 MG tablet    Sig: Take 40 mg by mouth daily.  . clobetasol cream (TEMOVATE) 0.05 %    Sig: Apply 1 application topically 2 (two) times daily.  . furosemide (LASIX) 40 MG tablet    Sig: Take 1.5 tablets (60 mg total) by mouth daily.    Dispense:  135 tablet     Refill:  3  . potassium chloride SA (K-DUR,KLOR-CON) 20 MEQ tablet    Sig: Take 1 tablet (20 mEq total) by mouth daily.    Dispense:  90 tablet    Refill:  3    Sakia Schrimpf  Thurmon Fair, MD, [facility] [phone] office [phone] pager

## 2013-02-22 NOTE — Assessment & Plan Note (Signed)
Recommend conservative therapy for edema including leg elevation. He does does not have findings that suggest congestive heart failure

## 2013-02-22 NOTE — Assessment & Plan Note (Signed)
6 ultrasound performed today shows almost complete resolution of the previous infrapopliteal left lower extremity DVT. Would continue anticoagulation for a minimum of 6 months, preferably a year. Not had any bleeding complications.

## 2013-02-24 ENCOUNTER — Ambulatory Visit: Payer: Medicare Other | Admitting: Pharmacist Clinician (PhC)/ Clinical Pharmacy Specialist

## 2013-03-19 ENCOUNTER — Ambulatory Visit (INDEPENDENT_AMBULATORY_CARE_PROVIDER_SITE_OTHER): Payer: Medicare Other | Admitting: Pharmacist Clinician (PhC)/ Clinical Pharmacy Specialist

## 2013-03-19 VITALS — BP 108/66 | HR 68

## 2013-03-19 DIAGNOSIS — I82409 Acute embolism and thrombosis of unspecified deep veins of unspecified lower extremity: Secondary | ICD-10-CM

## 2013-03-19 DIAGNOSIS — Z7901 Long term (current) use of anticoagulants: Secondary | ICD-10-CM

## 2013-03-19 LAB — POCT INR: INR: 3

## 2013-04-13 ENCOUNTER — Other Ambulatory Visit: Payer: Self-pay | Admitting: *Deleted

## 2013-04-13 MED ORDER — SIMVASTATIN 40 MG PO TABS: 40.0000 mg | ORAL_TABLET | Freq: Every day | ORAL | Status: DC

## 2013-04-16 ENCOUNTER — Ambulatory Visit (INDEPENDENT_AMBULATORY_CARE_PROVIDER_SITE_OTHER): Payer: Medicare Other | Admitting: Pharmacist Clinician (PhC)/ Clinical Pharmacy Specialist

## 2013-04-16 VITALS — BP 118/60 | HR 68

## 2013-04-16 DIAGNOSIS — I82409 Acute embolism and thrombosis of unspecified deep veins of unspecified lower extremity: Secondary | ICD-10-CM

## 2013-04-16 DIAGNOSIS — Z7901 Long term (current) use of anticoagulants: Secondary | ICD-10-CM

## 2013-04-16 LAB — POCT INR: INR: 4.5

## 2013-04-16 NOTE — Progress Notes (Signed)
Pt c/o rash on trunk.   Was seen by PCP or derm (?) and given cream.  States it appeared about time started potassium, lasix and warfarin.  Pt also stated that in addition to prescribed potassium he has bought OTC potassium.  He takes the OTC dose each am and the Rx dose each hs.  I asked that he discontinue the OTC dose at this time.  Potassium can cause rash, but I don't want to d/c Rx because of furosemide.  Pt to let me know if rash improves with decreasing KCl dose.

## 2013-04-30 ENCOUNTER — Ambulatory Visit (INDEPENDENT_AMBULATORY_CARE_PROVIDER_SITE_OTHER): Payer: Medicare Other | Admitting: Pharmacist Clinician (PhC)/ Clinical Pharmacy Specialist

## 2013-04-30 VITALS — BP 108/66 | HR 72

## 2013-04-30 DIAGNOSIS — Z7901 Long term (current) use of anticoagulants: Secondary | ICD-10-CM

## 2013-04-30 DIAGNOSIS — I82409 Acute embolism and thrombosis of unspecified deep veins of unspecified lower extremity: Secondary | ICD-10-CM

## 2013-04-30 LAB — POCT INR: INR: 3.1

## 2013-05-26 ENCOUNTER — Ambulatory Visit (INDEPENDENT_AMBULATORY_CARE_PROVIDER_SITE_OTHER): Payer: Medicare Other | Admitting: Pharmacist Clinician (PhC)/ Clinical Pharmacy Specialist

## 2013-05-26 ENCOUNTER — Ambulatory Visit (INDEPENDENT_AMBULATORY_CARE_PROVIDER_SITE_OTHER): Payer: Medicare Other | Admitting: Cardiovascular Disease

## 2013-05-26 ENCOUNTER — Encounter: Payer: Self-pay | Admitting: Cardiovascular Disease

## 2013-05-26 VITALS — BP 118/62 | HR 70 | Ht 70.0 in | Wt 229.6 lb

## 2013-05-26 VITALS — BP 118/62 | HR 68

## 2013-05-26 DIAGNOSIS — R609 Edema, unspecified: Secondary | ICD-10-CM

## 2013-05-26 DIAGNOSIS — I82409 Acute embolism and thrombosis of unspecified deep veins of unspecified lower extremity: Secondary | ICD-10-CM

## 2013-05-26 DIAGNOSIS — E785 Hyperlipidemia, unspecified: Secondary | ICD-10-CM

## 2013-05-26 DIAGNOSIS — Z7901 Long term (current) use of anticoagulants: Secondary | ICD-10-CM

## 2013-05-26 DIAGNOSIS — R600 Localized edema: Secondary | ICD-10-CM

## 2013-05-26 DIAGNOSIS — I2510 Atherosclerotic heart disease of native coronary artery without angina pectoris: Secondary | ICD-10-CM

## 2013-05-26 NOTE — Patient Instructions (Signed)
Your physician recommends that you schedule a follow-up appointment in: One Year.  

## 2013-05-26 NOTE — Progress Notes (Signed)
Patient ID: TRES GRZYWACZ, male   DOB: 08/04/1945, 67 y.o.   MRN: [id number]      Reason for office visit Followup DVT, dyslipidemia, coronary atherosclerosis  Casey Castillo is feeling well. He has not had angina pectoris, dyspnea, cough, hemoptysis, recurrence of lower extremity edema. He has mild erectile dysfunction that responds well to low-dose Cialis.  He had labs performed at the [facility]. Total cholesterol 140, HDL 36, LDL 56, triglycerides 2 and 40, hemoglobin A1c 5.9%, BUN 19, creatinine 1.2, normal liver function tests, normal TSH, hemoglobin 13.7 and negative microalbuminuria.  His dermatologist, Dr. Suan Halter gave him a knee to cream which has led to resolution of his rash, almost completely. The pruritus is gone.   Allergies  Allergen Reactions  . Adhesive [Tape]     EKG electrodes cause whelps.    Current Outpatient Prescriptions  Medication Sig Dispense Refill  . aspirin EC 81 MG tablet Take 81 mg by mouth daily.      . Cholecalciferol (VITAMIN D) 2000 UNITS CAPS Take 1 capsule by mouth daily.      . clobetasol cream (TEMOVATE) 0.05 % Apply 1 application topically 2 (two) times daily.      . furosemide (LASIX) 40 MG tablet Take 1.5 tablets (60 mg total) by mouth daily.  135 tablet  3  . loratadine (CLARITIN) 10 MG tablet Take 10 mg by mouth daily.      . Multiple Vitamins-Minerals (MULTIVITAMINS THER. W/MINERALS) TABS Take 1 tablet by mouth daily.      . Omega-3 Fatty Acids (FISH OIL) 1200 MG CAPS Take 1 capsule by mouth daily.      . potassium chloride SA (K-DUR,KLOR-CON) 20 MEQ tablet Take 1 tablet (20 mEq total) by mouth daily.  90 tablet  3  . simvastatin (ZOCOR) 40 MG tablet Take 1 tablet (40 mg total) by mouth daily.  90 tablet  2  . tadalafil (CIALIS) 5 MG tablet Take 5 mg by mouth daily as needed.      . Tamsulosin HCl (FLOMAX) 0.4 MG CAPS Take 0.4 mg by mouth daily.      . vitamin A 8000 UNIT capsule Take 8,000 Units by mouth every other  day.      . warfarin (COUMADIN) 5 MG tablet Take 1 & 1/2 tablets by mouth daily or as directed  135 tablet  2   No current facility-administered medications for this visit.    Past Medical History  Diagnosis Date  . Hyperlipidemia     primary Dr. Evette Cristal  . Enlarged prostate     takes flomax  . Urinary frequency     hx of   . History of blood clots     november, 5 1995  . Diverticulitis     hx of  . Coronary artery disease     5%blockage on left anterior descending Dr. Royann Shivers   . Kidney stone     hx of sees Dr. Kathie Rhodes. Rito Ehrlich    Past Surgical History  Procedure Laterality Date  . Cardiac catheterization      09-06-11   . Cardiovascular stress test      2006 and 2008 at [facility] heart [facility] vascular, 2011 myoview and echo  . Pilonidal cyst / sinus excision      surgery 01-08-74  . Kidney stone surgery      lithotripsy 1980's  . Knee surgery      left 04-11-94 ( has had two surgeries to  left knee)  . Trigger finger release      03-21-98 right hand ring finger  . Nasal sinus surgery      Jun 30 1998  . Shoulder surgery      right 12-20-2003  . Hemorrhoid surgery      sept 8, 2008  . Anterior cervical decomp/discectomy fusion  01/28/2012    Procedure: ANTERIOR CERVICAL DECOMPRESSION/DISCECTOMY FUSION 2 LEVELS;  Surgeon: Barnett Abu, MD;  Location: [facility];  Service: Neurosurgery;  Laterality: N/A;  Cervical six-seven, cervical seven-thoracic one Anterior cervical decompression/diskectomy/fusion  . Nm myoview ltd  07/20/2009    no ischemia  . US echocardiography  07/20/2009    LA & RA mildly dilated,trace MR,TR.    Family History  Problem Relation Age of Onset  . Heart attack Father     History   Social History  . Marital Status: Widowed    Spouse Name: N/A    Number of Children: N/A  . Years of Education: N/A   Occupational History  . Not on file.   Social History Main Topics  . Smoking status: Former Games developer  . Smokeless tobacco: Not on file      Comment: stopped 35 years ago  . Alcohol Use: No  . Drug Use: No  . Sexual Activity:    Other Topics Concern  . Not on file   Social History Narrative  . No narrative on file    Review of systems: The patient specifically denies any chest pain at rest or with exertion, dyspnea at rest or with exertion, orthopnea, paroxysmal nocturnal dyspnea, syncope, palpitations, focal neurological deficits, intermittent claudication, lower extremity edema, unexplained weight gain, cough, hemoptysis or wheezing.  The patient also denies abdominal pain, nausea, vomiting, dysphagia, diarrhea, constipation, polyuria, polydipsia, dysuria, hematuria, frequency, urgency, abnormal bleeding or bruising, fever, chills, unexpected weight changes, mood swings, change in skin or hair texture, change in voice quality, auditory or visual problems, allergic reactions or rashes, new musculoskeletal complaints other than usual "aches and pains".   PHYSICAL EXAM BP 118/62  Pulse 70  Ht 5\' 10"  (1.778 m)  Wt 229 lb 9.6 oz (104.146 kg)  BMI 32.94 kg/m2  General: Alert, oriented x3, no distress Head: no evidence of trauma, PERRL, EOMI, no exophtalmos or lid lag, no myxedema, no xanthelasma; normal ears, nose and oropharynx Neck: normal jugular venous pulsations and no hepatojugular reflux; brisk carotid pulses without delay and no carotid bruits Chest: clear to auscultation, no signs of consolidation by percussion or palpation, normal fremitus, symmetrical and full respiratory excursions Cardiovascular: normal position and quality of the apical impulse, regular rhythm, normal first and second heart sounds, no murmurs, rubs or gallops Abdomen: no tenderness or distention, no masses by palpation, no abnormal pulsatility or arterial bruits, normal bowel sounds, no hepatosplenomegaly Extremities: no clubbing, cyanosis or edema; 2+ radial, ulnar and brachial pulses bilaterally; 2+ right femoral, posterior tibial and dorsalis  pedis pulses; 2+ left femoral, posterior tibial and dorsalis pedis pulses; no subclavian or femoral bruits Neurological: grossly nonfocal   EKG: NSR, normal  Lipid Panel  No results found for this basename: chol, trig, hdl, cholhdl, vldl, ldlcalc    BMET    Component Value Date/Time   NA 141 01/15/2013 1519   K 4.3 01/15/2013 1519   CL 104 01/15/2013 1519   CO2 29 01/15/2013 1519   GLUCOSE 90 01/15/2013 1519   BUN 13 01/15/2013 1519   CREATININE 1.21 01/15/2013 1519   CREATININE 1.19 01/22/2012 1525  CALCIUM 9.2 01/15/2013 1519   GFRNONAA 62* 01/22/2012 1525   GFRAA 72* 01/22/2012 1525     ASSESSMENT AND PLAN DVT of lower limb, acute Barring any bleeding complications , I recommend continuing anticoagulation through next July.  Edema of both legs This has been successfully addressed with fairly conservative therapy. No evidence of congestive heart failure by physical exam or echocardiography.  Dyslipidemia Tolerating statin therapy without side effects, excellent total cholesterol and LDL cholesterol levels. High triglycerides and low HDL cholesterol , consistent with the metabolic syndrome, confirmed by borderline elevation in hemoglobin A1c at 5.9% and significant ventral obesity. Weight loss is strongly recommended. He should try to lose at least 30 pounds in weight gradually over the next couple of years. We described calorie restriction, avoidance of carbohydrates with high glycemic index and regular physical exercise.  Coronary atherosclerosis without stenosis Mild coronary luminal irregularities 2006 angiogram, normal nuclear stress test 2000 the left. He does not have angina pectoris  Orders Placed This Encounter  Procedures  . EKG 12-Lead   No orders of the defined types were placed in this encounter.    Casey Silk, MD, [facility] [phone] office [phone] pager

## 2013-05-26 NOTE — Assessment & Plan Note (Signed)
Mild coronary luminal irregularities 2006 angiogram, normal nuclear stress test 2000 the left. He does not have angina pectoris

## 2013-05-26 NOTE — Assessment & Plan Note (Signed)
This has been successfully addressed with fairly conservative therapy. No evidence of congestive heart failure by physical exam or echocardiography.

## 2013-05-26 NOTE — Assessment & Plan Note (Signed)
Tolerating statin therapy without side effects, excellent total cholesterol and LDL cholesterol levels. High triglycerides and low HDL cholesterol , consistent with the metabolic syndrome, confirmed by borderline elevation in hemoglobin A1c at 5.9% and significant ventral obesity. Weight loss is strongly recommended. He should try to lose at least 30 pounds in weight gradually over the next couple of years. We described calorie restriction, avoidance of carbohydrates with high glycemic index and regular physical exercise.

## 2013-05-26 NOTE — Assessment & Plan Note (Signed)
Barring any bleeding complications , I recommend continuing anticoagulation through next July.

## 2013-05-29 ENCOUNTER — Telehealth: Payer: Self-pay | Admitting: *Deleted

## 2013-05-29 NOTE — Telephone Encounter (Signed)
Pt was calling in regards to his potassium and Lasix. He is still getting leg cramps and wants to know if he should increase his Potassium. Pt. Asked to speak to you.

## 2013-05-29 NOTE — Telephone Encounter (Signed)
Is calling again for you jplease call

## 2013-05-29 NOTE — Telephone Encounter (Signed)
Spoke with patient, advised to avoid taking any more potassium at this time.  Suggested he try calcium and/or magnesium to help with the leg cramps.  Pt voiced understanding.

## 2013-06-25 ENCOUNTER — Ambulatory Visit (INDEPENDENT_AMBULATORY_CARE_PROVIDER_SITE_OTHER): Payer: Medicare Other | Admitting: Pharmacist Clinician (PhC)/ Clinical Pharmacy Specialist

## 2013-06-25 VITALS — BP 120/62 | HR 68

## 2013-06-25 DIAGNOSIS — I82409 Acute embolism and thrombosis of unspecified deep veins of unspecified lower extremity: Secondary | ICD-10-CM

## 2013-06-25 DIAGNOSIS — Z7901 Long term (current) use of anticoagulants: Secondary | ICD-10-CM

## 2013-06-25 LAB — POCT INR: INR: 3.1

## 2013-07-23 ENCOUNTER — Ambulatory Visit (INDEPENDENT_AMBULATORY_CARE_PROVIDER_SITE_OTHER): Payer: Medicare Other | Admitting: Pharmacist Clinician (PhC)/ Clinical Pharmacy Specialist

## 2013-07-23 VITALS — BP 118/68 | HR 76

## 2013-07-23 DIAGNOSIS — Z7901 Long term (current) use of anticoagulants: Secondary | ICD-10-CM

## 2013-07-23 DIAGNOSIS — I82409 Acute embolism and thrombosis of unspecified deep veins of unspecified lower extremity: Secondary | ICD-10-CM

## 2013-07-23 LAB — POCT INR: INR: 2.6

## 2013-08-10 ENCOUNTER — Other Ambulatory Visit (HOSPITAL_COMMUNITY): Payer: Self-pay | Admitting: Internal Medicine

## 2013-08-10 DIAGNOSIS — Z0000 Encounter for general adult medical examination without abnormal findings: Secondary | ICD-10-CM

## 2013-08-17 ENCOUNTER — Ambulatory Visit (HOSPITAL_COMMUNITY)
Admission: RE | Admit: 2013-08-17 | Discharge: 2013-08-17 | Disposition: A | Discharge status: Home / Self Care | Payer: Medicare Other | Source: Ambulatory Visit | Attending: Internal Medicine | Admitting: Internal Medicine

## 2013-08-17 DIAGNOSIS — Z0000 Encounter for general adult medical examination without abnormal findings: Secondary | ICD-10-CM

## 2013-08-17 DIAGNOSIS — I658 Occlusion and stenosis of other precerebral arteries: Secondary | ICD-10-CM | POA: Insufficient documentation

## 2013-08-17 DIAGNOSIS — R109 Unspecified abdominal pain: Secondary | ICD-10-CM | POA: Insufficient documentation

## 2013-08-17 DIAGNOSIS — I6529 Occlusion and stenosis of unspecified carotid artery: Secondary | ICD-10-CM | POA: Insufficient documentation

## 2013-08-26 ENCOUNTER — Ambulatory Visit: Payer: Medicare Other | Admitting: Pharmacist Clinician (PhC)/ Clinical Pharmacy Specialist

## 2013-08-31 ENCOUNTER — Ambulatory Visit (INDEPENDENT_AMBULATORY_CARE_PROVIDER_SITE_OTHER): Payer: Medicare Other | Admitting: Pharmacist Clinician (PhC)/ Clinical Pharmacy Specialist

## 2013-08-31 DIAGNOSIS — I82409 Acute embolism and thrombosis of unspecified deep veins of unspecified lower extremity: Secondary | ICD-10-CM

## 2013-08-31 DIAGNOSIS — Z7901 Long term (current) use of anticoagulants: Secondary | ICD-10-CM

## 2013-08-31 LAB — POCT INR: INR: 2.7

## 2013-09-25 ENCOUNTER — Other Ambulatory Visit: Payer: Self-pay | Admitting: Pharmacist Clinician (PhC)/ Clinical Pharmacy Specialist

## 2013-09-25 MED ORDER — WARFARIN SODIUM 5 MG PO TABS: ORAL_TABLET | ORAL | Status: DC

## 2013-09-30 ENCOUNTER — Ambulatory Visit (INDEPENDENT_AMBULATORY_CARE_PROVIDER_SITE_OTHER): Payer: Medicare Other | Admitting: Pharmacist Clinician (PhC)/ Clinical Pharmacy Specialist

## 2013-09-30 DIAGNOSIS — Z7901 Long term (current) use of anticoagulants: Secondary | ICD-10-CM

## 2013-09-30 DIAGNOSIS — I82409 Acute embolism and thrombosis of unspecified deep veins of unspecified lower extremity: Secondary | ICD-10-CM

## 2013-09-30 LAB — POCT INR: INR: 2.9

## 2013-10-27 ENCOUNTER — Ambulatory Visit: Payer: Medicare Other | Admitting: Pharmacist Clinician (PhC)/ Clinical Pharmacy Specialist

## 2013-10-29 ENCOUNTER — Ambulatory Visit (INDEPENDENT_AMBULATORY_CARE_PROVIDER_SITE_OTHER): Payer: Medicare Other | Admitting: Pharmacist Clinician (PhC)/ Clinical Pharmacy Specialist

## 2013-10-29 DIAGNOSIS — I82409 Acute embolism and thrombosis of unspecified deep veins of unspecified lower extremity: Secondary | ICD-10-CM

## 2013-10-29 DIAGNOSIS — Z7901 Long term (current) use of anticoagulants: Secondary | ICD-10-CM

## 2013-10-29 LAB — POCT INR: INR: 3

## 2013-11-17 ENCOUNTER — Other Ambulatory Visit: Payer: Self-pay | Admitting: *Deleted

## 2013-11-17 MED ORDER — SIMVASTATIN 40 MG PO TABS: 40.0000 mg | ORAL_TABLET | Freq: Every day | ORAL | Status: DC

## 2013-11-17 MED ORDER — POTASSIUM CHLORIDE CRYS ER 20 MEQ PO TBCR: 20.0000 meq | EXTENDED_RELEASE_TABLET | Freq: Every day | ORAL | Status: DC

## 2013-11-17 NOTE — Telephone Encounter (Signed)
Rx refill sent to patient pharmacy   

## 2013-11-26 ENCOUNTER — Ambulatory Visit (INDEPENDENT_AMBULATORY_CARE_PROVIDER_SITE_OTHER): Payer: Medicare Other | Admitting: Pharmacist

## 2013-11-26 DIAGNOSIS — Z7901 Long term (current) use of anticoagulants: Secondary | ICD-10-CM

## 2013-11-26 DIAGNOSIS — I82409 Acute embolism and thrombosis of unspecified deep veins of unspecified lower extremity: Secondary | ICD-10-CM

## 2013-11-26 LAB — POCT INR: INR: 2.1

## 2013-12-31 ENCOUNTER — Ambulatory Visit (INDEPENDENT_AMBULATORY_CARE_PROVIDER_SITE_OTHER): Payer: Medicare Other | Admitting: Pharmacist Clinician (PhC)/ Clinical Pharmacy Specialist

## 2013-12-31 DIAGNOSIS — I82409 Acute embolism and thrombosis of unspecified deep veins of unspecified lower extremity: Secondary | ICD-10-CM

## 2013-12-31 DIAGNOSIS — Z7901 Long term (current) use of anticoagulants: Secondary | ICD-10-CM

## 2013-12-31 LAB — POCT INR: INR: 3.4

## 2014-05-27 ENCOUNTER — Ambulatory Visit (INDEPENDENT_AMBULATORY_CARE_PROVIDER_SITE_OTHER): Payer: Medicare Other | Admitting: Cardiovascular Disease

## 2014-05-27 VITALS — BP 120/70 | HR 62 | Ht 70.0 in | Wt 229.2 lb

## 2014-05-27 DIAGNOSIS — I2510 Atherosclerotic heart disease of native coronary artery without angina pectoris: Secondary | ICD-10-CM

## 2014-05-27 DIAGNOSIS — E785 Hyperlipidemia, unspecified: Secondary | ICD-10-CM

## 2014-05-27 DIAGNOSIS — Z7901 Long term (current) use of anticoagulants: Secondary | ICD-10-CM

## 2014-05-27 NOTE — Patient Instructions (Signed)
Your physician wants you to follow-up in: 1 year with Dr. Croitoru. You will receive a reminder letter in the mail two months in advance. If you don't receive a letter, please call our office to schedule the follow-up appointment.  

## 2014-05-29 ENCOUNTER — Encounter: Payer: Self-pay | Admitting: Cardiovascular Disease

## 2014-05-29 NOTE — Progress Notes (Signed)
Patient ID: BERNIE RANSFORD, male   DOB: [birth year], 68 y.o.   MRN: [id number]      Reason for office visit Dyslipidemia, coronary atherosclerosis, history of DVT  Imir feels well. His girlfriend is an avid runner and he follows her at a "power walking" pace [place] the [place] (1.7 mile loop) 3 days a week. Unfortunately, he has gained weight. His labs were done at the [place] in October, no copy available. Last year on same meds, LDL was 56. He has no cardiac complaints. Cialis effective for ED. No recurrence of DVT symptoms   Allergies  Allergen Reactions  . Adhesive [Tape]     EKG electrodes cause whelps.    Current Outpatient Prescriptions  Medication Sig Dispense Refill  . aspirin EC 81 MG tablet Take 81 mg by mouth daily.    . Cholecalciferol (VITAMIN D) 2000 UNITS CAPS Take 1 capsule by mouth daily.    . clobetasol cream (TEMOVATE) 9.98 % Apply 1 application topically 2 (two) times daily as needed.     . furosemide (LASIX) 40 MG tablet Take 1.5 tablets (60 mg total) by mouth daily. 135 tablet 3  . loratadine (CLARITIN) 10 MG tablet Take 10 mg by mouth daily.    . Multiple Vitamins-Minerals (MULTIVITAMINS THER. W/MINERALS) TABS Take 1 tablet by mouth daily.    . Omega-3 Fatty Acids (FISH OIL) 1200 MG CAPS Take 1 capsule by mouth daily.    . pantoprazole (PROTONIX) 40 MG tablet Take 1 tablet by mouth daily.  2  . potassium chloride SA (K-DUR,KLOR-CON) 20 MEQ tablet Take 1 tablet (20 mEq total) by mouth daily. 90 tablet 1  . simvastatin (ZOCOR) 40 MG tablet Take 1 tablet (40 mg total) by mouth daily. 90 tablet 1  . tadalafil (CIALIS) 5 MG tablet Take 5 mg by mouth daily as needed.    . Tamsulosin HCl (FLOMAX) 0.4 MG CAPS Take 0.4 mg by mouth daily.    . vitamin A 8000 UNIT capsule Take 8,000 Units by mouth every other day.     No current facility-administered medications for this visit.    Past Medical History  Diagnosis Date  . Hyperlipidemia     primary Dr. Selena Batten  .  Enlarged prostate     takes flomax  . Urinary frequency     hx of   . History of blood clots     november, 5 1995  . Diverticulitis     hx of  . Coronary artery disease     5%blockage on left anterior descending Dr. Sallyanne Kuster   . Kidney stone     hx of sees Dr. Chauncey Cruel. [place] [facility]    Past Surgical History  Procedure Laterality Date  . Cardiac catheterization      09-06-11   . Cardiovascular stress test      2006 and 2008 at [facility] heart and vascular, 2011 myoview and echo  . Pilonidal cyst / sinus excision      surgery 01-08-74  . Kidney stone surgery      lithotripsy 1980's  . Knee surgery      left 04-11-94 ( has had two surgeries to left knee)  . Trigger finger release      03-21-98 right hand ring finger  . Nasal sinus surgery      Jun 30 1998  . Shoulder surgery      right 12-20-2003  . Hemorrhoid surgery      sept 8, 2008  . Anterior  cervical decomp/discectomy fusion  01/28/2012    Procedure: ANTERIOR CERVICAL DECOMPRESSION/DISCECTOMY FUSION 2 LEVELS;  Surgeon: Kristeen Miss, MD;  Location: [facility];  Service: Neurosurgery;  Laterality: N/A;  Cervical six-seven, cervical seven-thoracic one Anterior cervical decompression/diskectomy/fusion  . Nm myoview ltd  07/20/2009    no ischemia  . US echocardiography  07/20/2009    LA & RA mildly dilated,trace MR,TR.    Family History  Problem Relation Age of Onset  . Heart attack Father     History   Social History  . Marital Status: Widowed    Spouse Name: N/A    Number of Children: N/A  . Years of Education: N/A   Occupational History  . Not on file.   Social History Main Topics  . Smoking status: Former Research scientist (life sciences)  . Smokeless tobacco: Not on file     Comment: stopped 35 years ago  . Alcohol Use: No  . Drug Use: No  . Sexual Activity: Not on file   Other Topics Concern  . Not on file   Social History Narrative  . No narrative on file    Review of systems: The patient specifically denies any chest pain at  rest or with exertion, dyspnea at rest or with exertion, orthopnea, paroxysmal nocturnal dyspnea, syncope, palpitations, focal neurological deficits, intermittent claudication, lower extremity edema, unexplained weight gain, cough, hemoptysis or wheezing.  The patient also denies abdominal pain, nausea, vomiting, dysphagia, diarrhea, constipation, polyuria, polydipsia, dysuria, hematuria, frequency, urgency, abnormal bleeding or bruising, fever, chills, unexpected weight changes, mood swings, change in skin or hair texture, change in voice quality, auditory or visual problems, allergic reactions or rashes, new musculoskeletal complaints other than usual "aches and pains".   PHYSICAL EXAM BP 120/70 mmHg  Pulse 62  Ht 5\' 10"  (1.778 m)  Wt 229 lb 3.2 oz (103.964 kg)  BMI 32.89 kg/m2  General: Alert, oriented x3, no distress Head: no evidence of trauma, PERRL, EOMI, no exophtalmos or lid lag, no myxedema, no xanthelasma; normal ears, nose and oropharynx Neck: normal jugular venous pulsations and no hepatojugular reflux; brisk carotid pulses without delay and no carotid bruits Chest: clear to auscultation, no signs of consolidation by percussion or palpation, normal fremitus, symmetrical and full respiratory excursions Cardiovascular: normal position and quality of the apical impulse, regular rhythm, normal first and second heart sounds, no murmurs, rubs or gallops Abdomen: no tenderness or distention, no masses by palpation, no abnormal pulsatility or arterial bruits, normal bowel sounds, no hepatosplenomegaly Extremities: no clubbing, cyanosis or edema; 2+ radial, ulnar and brachial pulses bilaterally; 2+ right femoral, posterior tibial and dorsalis pedis pulses; 2+ left femoral, posterior tibial and dorsalis pedis pulses; no subclavian or femoral bruits Neurological: grossly nonfocal   EKG: NSR  BMET    Component Value Date/Time   NA 141 01/15/2013 1519   K 4.3 01/15/2013 1519   CL 104  01/15/2013 1519   CO2 29 01/15/2013 1519   GLUCOSE 90 01/15/2013 1519   BUN 13 01/15/2013 1519   CREATININE 1.21 01/15/2013 1519   CREATININE 1.19 01/22/2012 1525   CALCIUM 9.2 01/15/2013 1519   GFRNONAA 62* 01/22/2012 1525   GFRAA 72* 01/22/2012 1525     ASSESSMENT AND PLAN DVT of lower limb, acute Resolved. Stopped anticoagulant in July.  Dyslipidemia Tolerating statin therapy without side effects, excellent total cholesterol and LDL cholesterol levels. High triglycerides and low HDL cholesterol , consistent with the metabolic syndrome, confirmed by borderline elevation in hemoglobin A1c at 5.9% and  significant ventral obesity. Weight loss is strongly recommended.  We discussed calorie restriction, avoidance of carbohydrates with high glycemic index and regular physical exercise.  Coronary atherosclerosis without stenosis Mild coronary luminal irregularities 2006 angiogram, normal nuclear stress test 2012. He does not have angina pectoris. Orders Placed This Encounter  Procedures  . EKG 12-Lead   Meds ordered this encounter  Medications  . pantoprazole (PROTONIX) 40 MG tablet    Sig: Take 1 tablet by mouth daily.    Refill:  [place] Francena Zender, MD, [facility] [phone] office [phone] pager

## 2014-06-01 ENCOUNTER — Encounter: Payer: Self-pay | Admitting: Cardiovascular Disease

## 2014-06-14 ENCOUNTER — Other Ambulatory Visit: Payer: Self-pay | Admitting: Cardiovascular Disease

## 2014-06-14 MED ORDER — FUROSEMIDE 40 MG PO TABS: 60.0000 mg | ORAL_TABLET | Freq: Every day | ORAL | Status: DC

## 2014-06-14 MED ORDER — SIMVASTATIN 40 MG PO TABS: 40.0000 mg | ORAL_TABLET | Freq: Every day | ORAL | Status: DC

## 2014-06-14 MED ORDER — POTASSIUM CHLORIDE CRYS ER 20 MEQ PO TBCR: 20.0000 meq | EXTENDED_RELEASE_TABLET | Freq: Every day | ORAL | Status: DC

## 2014-06-14 NOTE — Telephone Encounter (Signed)
Rx(s) sent to pharmacy electronically.  

## 2014-06-14 NOTE — Telephone Encounter (Signed)
Pt need to change pharmacy.Please fax his Potassium,Furosemide,and Simvastatin to [facility] [phone]. Member AY#[id number]

## 2015-05-30 ENCOUNTER — Encounter: Payer: Self-pay | Admitting: Cardiovascular Disease

## 2015-05-30 ENCOUNTER — Ambulatory Visit (INDEPENDENT_AMBULATORY_CARE_PROVIDER_SITE_OTHER): Payer: PPO | Admitting: Cardiovascular Disease

## 2015-05-30 VITALS — BP 104/76 | HR 60 | Ht 70.0 in | Wt 228.4 lb

## 2015-05-30 DIAGNOSIS — I2510 Atherosclerotic heart disease of native coronary artery without angina pectoris: Secondary | ICD-10-CM | POA: Diagnosis not present

## 2015-05-30 DIAGNOSIS — E785 Hyperlipidemia, unspecified: Secondary | ICD-10-CM

## 2015-05-30 NOTE — Patient Instructions (Signed)
Dr. Croitoru recommends that you schedule a follow-up appointment in: ONE YEAR   

## 2015-05-30 NOTE — Progress Notes (Signed)
Patient ID: Casey Castillo, male   DOB: August 14, 1945, 69 y.o.   MRN: [id number]    Cardiology Office Note    Date:  05/30/2015   ID:  Casey Castillo, DOB 1946-06-11, MRN [id number]  PCP:  Glo Herring., MD  Cardiologist:   Sanda Klein, MD   Chief Complaint  Patient presents with  . Follow-up    no chest pain, no shortness of breath, no edema, no pain or cramping in legs, no lightheaded and dizziness    History of Present Illness:  Casey Castillo is a 69 y.o. male  With a history of remote DVT of the lower extremity, no longer on anticoagulation as well as a history of coronary atherosclerosis without severe stenoses  (remote coronary angiography) and hyperlipidemia.   [name] has had a good year without any serious health problems. He continues to walk on the [employer] as well as work on his 7 acre plot of land. He denies issues with exertional chest pain or dyspnea, but has knee arthritis which slows him down. He has not had any other cardiovascular issues.  He had a normal nuclear stress test in 2011. He is mildly obese.  He blames his girlfriend from being an excellent Optician, dispensing.   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 His hemoglobin A1c was 5.8%, total cholesterol 138, triglycerides 89, HDL 46, LDL 74, normal TSH, normal liver function tests , creatinine 1.3 , hemoglobin 14.4%  Past Medical History  Diagnosis Date  . Hyperlipidemia     primary Dr. Selena Batten  . Enlarged prostate     takes flomax  . Urinary frequency     hx of   . History of blood clots     november, 5 1995  . Diverticulitis     hx of  . Coronary artery disease     5%blockage on left anterior descending Dr. Sallyanne Kuster   . Kidney stone     hx of sees Dr. Chauncey Cruel. [place] [facility]    Past Surgical History  Procedure Laterality Date  . Cardiac catheterization      09-06-11   . Cardiovascular stress test      2006 and 2008 at [facility] heart and vascular,  2011 myoview and echo  . Pilonidal cyst / sinus excision      surgery 01-08-74  . Kidney stone surgery      lithotripsy 1980's  . Knee surgery      left 04-11-94 ( has had two surgeries to left knee)  . Trigger finger release      03-21-98 right hand ring finger  . Nasal sinus surgery      Jun 30 1998  . Shoulder surgery      right 12-20-2003  . Hemorrhoid surgery      sept 8, 2008  . Anterior cervical decomp/discectomy fusion  01/28/2012    Procedure: ANTERIOR CERVICAL DECOMPRESSION/DISCECTOMY FUSION 2 LEVELS;  Surgeon: Kristeen Miss, MD;  Location: [facility];  Service: Neurosurgery;  Laterality: N/A;  Cervical six-seven, cervical seven-thoracic one Anterior cervical decompression/diskectomy/fusion  . Nm myoview ltd  07/20/2009    no ischemia  . US echocardiography  07/20/2009    LA & RA mildly dilated,trace MR,TR.    Current Outpatient Prescriptions  Medication Sig Dispense Refill  . aspirin EC 81 MG tablet Take 81 mg by mouth daily.    . Cholecalciferol (VITAMIN D) 2000 UNITS CAPS Take 1 capsule by  mouth daily.    . clobetasol cream (TEMOVATE) [id number] % Apply 1 application topically 2 (two) times daily as needed.     . loratadine (CLARITIN) 10 MG tablet Take 10 mg by mouth daily.    . Omega-3 Fatty Acids (FISH OIL) 1200 MG CAPS Take 1 capsule by mouth daily.    . pantoprazole (PROTONIX) 40 MG tablet Take 1 tablet by mouth daily.  2  . potassium chloride SA (K-DUR,KLOR-CON) 20 MEQ tablet Take 1 tablet (20 mEq total) by mouth daily. 90 tablet 3  . simvastatin (ZOCOR) 40 MG tablet Take 1 tablet (40 mg total) by mouth daily. 90 tablet 3  . tadalafil (CIALIS) 5 MG tablet Take 5 mg by mouth daily as needed.    . Tamsulosin HCl (FLOMAX) 0.4 MG CAPS Take 0.4 mg by mouth daily.     No current facility-administered medications for this visit.    Allergies:   Adhesive   Social History   Social History  . Marital Status: Widowed    Spouse Name: N/A  . Number of Children: N/A  . Years of  Education: N/A   Social History Main Topics  . Smoking status: Former Research scientist (life sciences)  . Smokeless tobacco: Not on file     Comment: stopped 35 years ago  . Alcohol Use: No  . Drug Use: No  . Sexual Activity: Not on file   Other Topics Concern  . Not on file   Social History Narrative     Family History:  The patient's family history includes Heart attack in his father.   ROS:   Please see the history of present illness.    Review of Systems  Musculoskeletal: Positive for arthritis, joint pain and joint swelling.   All other systems reviewed and are negative.   PHYSICAL EXAM:   VS:  BP 104/76 mmHg  Pulse 60  Ht 5\' 10"  (1.778 m)  Wt 228 lb 7 oz (103.619 kg)  BMI 32.78 kg/m2   GEN: Well nourished, well developed, in no acute distress HEENT: normal Neck: no JVD, carotid bruits, or masses Cardiac: RRR; no murmurs, rubs, or gallops,no edema  Respiratory:  clear to auscultation bilaterally, normal work of breathing GI: soft, nontender, nondistended, + BS MS: no deformity or atrophy Skin: warm and dry, no rash Neuro:  Alert and Oriented x 3, Strength and sensation are intact Psych: euthymic mood, full affect  Wt Readings from Last 3 Encounters:  05/30/15 228 lb 7 oz (103.619 kg)  05/27/14 229 lb 3.2 oz (103.964 kg)  05/26/13 229 lb 9.6 oz (104.146 kg)      Studies/Labs Reviewed:   EKG:  EKG is ordered today.  The ekg ordered today demonstrates  Normal sinus rhythm, normal tracing  Recent Labs:  hemoglobin A1c was 5.8%, normal TSH, normal liver function tests , creatinine 1.3 , hemoglobin 14.4%    Lipid Panel total cholesterol 138, triglycerides 89, HDL 46, LDL 74,    ASSESSMENT:    1. Atherosclerosis of native coronary artery of native heart without angina pectoris   2. Dyslipidemia      PLAN:  In order of problems listed above:  1.  asymptomatic despite active lifestyle, no change in current therapy 2.  Excellent lipid profile on current regimen. Encourage  weight loss. Reduce intake of sugars and starches.   Medication Adjustments/Labs and Tests Ordered: Current medicines are reviewed at length with the patient today.  Concerns regarding medicines are outlined above.  Medication changes, Labs and Tests  ordered today are listed below. Patient Instructions  Dr. Sallyanne Kuster recommends that you schedule a follow-up appointment in: ONE YEAR      SignedSanda Klein, MD  05/30/2015 3:20 PM    [facility] [address], [place], [place]  [address] Phone: [phone]; Fax: [phone]

## 2015-07-28 DIAGNOSIS — L850 Acquired ichthyosis: Secondary | ICD-10-CM | POA: Diagnosis not present

## 2015-07-28 DIAGNOSIS — L570 Actinic keratosis: Secondary | ICD-10-CM | POA: Diagnosis not present

## 2015-07-28 DIAGNOSIS — L309 Dermatitis, unspecified: Secondary | ICD-10-CM | POA: Diagnosis not present

## 2015-07-28 DIAGNOSIS — X32XXXD Exposure to sunlight, subsequent encounter: Secondary | ICD-10-CM | POA: Diagnosis not present

## 2015-07-29 ENCOUNTER — Telehealth: Payer: Self-pay | Admitting: *Deleted

## 2015-07-29 MED ORDER — POTASSIUM CHLORIDE CRYS ER 20 MEQ PO TBCR: 20.0000 meq | EXTENDED_RELEASE_TABLET | Freq: Every day | ORAL | Status: DC

## 2015-07-29 NOTE — Telephone Encounter (Signed)
called asking for refills on his K, the pharm told him that they sent a request and it was denied, i do not see that so i will send a refill to [facility].

## 2015-08-25 DIAGNOSIS — Z1211 Encounter for screening for malignant neoplasm of colon: Secondary | ICD-10-CM | POA: Diagnosis not present

## 2015-08-26 NOTE — H&P (Signed)
  NTS SOAP Note  Vital Signs:  Vitals as of: [id number]: Systolic [id number]: Diastolic 72: Heart Rate 68: Temp 98.70F (Temporal): Height 80ft 10in: Weight 230Lbs 0 Ounces: BMI 33   BMI : 33 kg/m2  Subjective: This 70 year old male presents for of need for screening TCS.  Last had a colonoscopy over ten years ago.  Denies any lower gi complaints.  No family h/o colon cancer.  Review of Symptoms:  Constitutional:unremarkable   Head:unremarkable Eyes:unremarkable   Nose/Mouth/Throat:unremarkable Cardiovascular:  unremarkable Respiratory:unremarkable Gastrointestinal:  unremarkable   Genitourinary:frequency Musculoskeletal:unremarkable Skin:unremarkable Hematolgic/Lymphatic:unremarkable   Allergic/Immunologic:unremarkable   Past Medical History:  Reviewed  Past Medical History  Surgical History: left knee surgery, hemorrhoidectomy, kidney stone removal Medical Problems: CAD, dvt Allergies: nkda Medications: simvastatin, baby asa, loratadine, lasix, pantoprazole, tamsulosin,    Social History:Reviewed  Social History  Preferred Language: English Race:  White Ethnicity: Not Hispanic / Latino Age: 8 year Marital Status:  M Alcohol: no   Smoking Status: Never smoker reviewed on 08/25/2015 Functional Status reviewed on 08/25/2015 ------------------------------------------------ Bathing: Normal Cooking: Normal Dressing: Normal Driving: Normal Eating: Normal Managing Meds: Normal Oral Care: Normal Shopping: Normal Toileting: Normal Transferring: Normal Walking: Normal Cognitive Status reviewed on 08/25/2015 ------------------------------------------------ Attention: Normal Decision Making: Normal Language: Normal Memory: Normal Motor: Normal Perception: Normal Problem Solving: Normal Visual and Spatial: Normal   Family History:Reviewed  Family Health History Mother, Deceased; Heart disease;  Father, Deceased; Heart disease;      Objective Information: General:Well appearing, well nourished in no distress. Heart:RRR, no murmur Lungs:  CTA bilaterally, no wheezes, rhonchi, rales.  Breathing unlabored. Abdomen:Soft, NT/ND, no HSM, no masses. deferred to procedure  Assessment:Need for screening TCS  Diagnoses: V76.51  Z12.11 Screening for malignant neoplasm of colon (Encounter for screening for malignant neoplasm of colon)  Procedures: [id number] - OFFICE OUTPATIENT NEW 20 MINUTES    Plan:  Scheduled for screening TCS on 08/30/15.   Patient Education:Alternative treatments to surgery were discussed with patient (and family).  Risks and benefits  of procedure including bleeding infection, and perforation were fully explained to the patient (and family) who gave informed consent. Patient/family questions were addressed.  Follow-up:Pending Surgery

## 2015-08-30 ENCOUNTER — Ambulatory Visit (HOSPITAL_COMMUNITY)
Admission: RE | Admit: 2015-08-30 | Discharge: 2015-08-30 | Disposition: A | Discharge status: Home / Self Care | Payer: PPO | Source: Ambulatory Visit | Attending: General Surgery | Admitting: General Surgery

## 2015-08-30 ENCOUNTER — Encounter (HOSPITAL_COMMUNITY)
Admission: RE | Disposition: A | Discharge status: Home / Self Care | Payer: Self-pay | Source: Ambulatory Visit | Attending: General Surgery

## 2015-08-30 ENCOUNTER — Encounter (HOSPITAL_COMMUNITY): Payer: Self-pay | Admitting: *Deleted

## 2015-08-30 DIAGNOSIS — K5730 Diverticulosis of large intestine without perforation or abscess without bleeding: Secondary | ICD-10-CM | POA: Insufficient documentation

## 2015-08-30 DIAGNOSIS — D122 Benign neoplasm of ascending colon: Secondary | ICD-10-CM | POA: Insufficient documentation

## 2015-08-30 DIAGNOSIS — Z7982 Long term (current) use of aspirin: Secondary | ICD-10-CM | POA: Insufficient documentation

## 2015-08-30 DIAGNOSIS — K635 Polyp of colon: Secondary | ICD-10-CM | POA: Diagnosis not present

## 2015-08-30 DIAGNOSIS — Z1211 Encounter for screening for malignant neoplasm of colon: Secondary | ICD-10-CM | POA: Diagnosis not present

## 2015-08-30 HISTORY — DX: Gastro-esophageal reflux disease without esophagitis: K21.9

## 2015-08-30 HISTORY — PX: COLONOSCOPY: SHX5424

## 2015-08-30 HISTORY — DX: Other seasonal allergic rhinitis: J30.2

## 2015-08-30 SURGERY — COLONOSCOPY
Anesthesia: Moderate Sedation

## 2015-08-30 MED ORDER — SODIUM CHLORIDE 0.9 % IV SOLN
INTRAVENOUS | Status: DC
  Administered 2015-08-30: 07:00:00 via INTRAVENOUS

## 2015-08-30 MED ORDER — MEPERIDINE HCL 50 MG/ML IJ SOLN
INTRAMUSCULAR | Status: DC | PRN
  Administered 2015-08-30: 50 mg via INTRAVENOUS

## 2015-08-30 MED ORDER — STERILE WATER FOR IRRIGATION IR SOLN
Status: DC | PRN
  Administered 2015-08-30: 07:00:00

## 2015-08-30 MED ORDER — MIDAZOLAM HCL 5 MG/5ML IJ SOLN
INTRAMUSCULAR | Status: DC | PRN
  Administered 2015-08-30: 4 mg via INTRAVENOUS
  Administered 2015-08-30: 1 mg via INTRAVENOUS

## 2015-08-30 MED ORDER — MEPERIDINE HCL 50 MG/ML IJ SOLN
INTRAMUSCULAR | Status: AC
  Filled 2015-08-30: qty 1

## 2015-08-30 MED ORDER — MIDAZOLAM HCL 5 MG/5ML IJ SOLN
INTRAMUSCULAR | Status: AC
  Filled 2015-08-30: qty 5

## 2015-08-30 NOTE — Op Note (Signed)
[facility] Patient Name: Casey Castillo Procedure Date: 08/30/2015 7:22 AM MRN: [id number] Date of Birth: Sep 10, 1945 Attending MD: Aviva Signs , MD CSN: [id number] Age: 70 Admit Type: Outpatient Procedure:                Colonoscopy Indications:              Screening for colorectal malignant neoplasm Providers:                Aviva Signs, MD, Gwenlyn Fudge, RN, Georgeann Oppenheim,                            Technician Referring MD:              Medicines:                Meperidine 50 mg IV, Midazolam 5 mg IV Complications:            No immediate complications. Estimated Blood Loss:     Estimated blood loss: none. Procedure:                Pre-Anesthesia Assessment:                           - Prior to the procedure, a History and Physical                            was performed, and patient medications and                            allergies were reviewed. The patient is competent.                            The risks and benefits of the procedure and the                            sedation options and risks were discussed with the                            patient. All questions were answered and informed                            consent was obtained. Patient identification and                            proposed procedure were verified by the physician,                            the nurse and the technician in the procedure room.                            Mental Status Examination: alert and oriented.                            Airway Examination: normal oropharyngeal airway and  neck mobility. Respiratory Examination: clear to                            auscultation. CV Examination: RRR, no murmurs, no                            S3 or S4. Prophylactic Antibiotics: The patient                            does not require prophylactic antibiotics. Prior                            Anticoagulants: The patient has taken no previous          anticoagulant or antiplatelet agents. ASA Grade                            Assessment: II - A patient with mild systemic                            disease. After reviewing the risks and benefits,                            the patient was deemed in satisfactory condition to                            undergo the procedure. The anesthesia plan was to                            use moderate sedation / analgesia (conscious                            sedation). Immediately prior to administration of                            medications, the patient was re-assessed for                            adequacy to receive sedatives. The heart rate,                            respiratory rate, oxygen saturations, blood                            pressure, adequacy of pulmonary ventilation, and                            response to care were monitored throughout the                            procedure. The physical status of the patient was                            re-assessed after the procedure.  After obtaining informed consent, the colonoscope                            was passed under direct vision. Throughout the                            procedure, the patient's blood pressure, pulse, and                            oxygen saturations were monitored continuously. The                            [id number] scope was introduced through                            the anus and advanced to the the cecum, identified                            by the appendiceal orifice, ileocecal valve and                            palpation. The colonoscopy was performed with ease.                            The patient tolerated the procedure well. The                            quality of the bowel preparation was excellent.                            Anatomical landmarks were photographed. Scope In: 7:27:58 AM Scope Out: 7:47:06 AM Scope Withdrawal Time: 0 hours 15  minutes 4 seconds  Total Procedure Duration: 0 hours 19 minutes 8 seconds  Findings:      The perianal and digital rectal examinations were normal.      A 20 mm polyp was found in the proximal ascending colon. The polyp was       sessile. The polyp was removed with a piecemeal technique using a hot       snare. Resection and retrieval were complete using a suction (via the       working channel). Estimated blood loss: none.      A few small-mouthed diverticula were found in the sigmoid colon.      The exam was otherwise without abnormality on direct and retroflexion       views.      The patient tolerated the procedure well. Impression:               - One 20 mm polyp in the proximal ascending colon,                            removed piecemeal using a hot snare. Resected and                            retrieved.                           -  Diverticulosis in the sigmoid colon.                           - The examination was otherwise normal on direct                            and retroflexion views. Moderate Sedation:      Moderate (conscious) sedation was administered by the endoscopy nurse       and supervised by the endoscopist. The following parameters were       monitored: oxygen saturation, heart rate, blood pressure, and response       to care. Total physician intraservice time was 20 minutes. Recommendation:           - Written discharge instructions were provided to                            the patient.                           - Discontinue aspirin and NSAIDs for 1 week.                           - Await pathology results.                           - Repeat colonoscopy in 3 years for surveillance of                            polyps greater than 1 cm in size.                           - Resume previous diet.                           - Patient has a contact number available for                            emergencies. The signs and symptoms of potential                             delayed complications were discussed with the                            patient. Return to normal activities tomorrow.                            Written discharge instructions were provided to the                            patient.                           - Patient has a contact number available for                            emergencies. The  signs and symptoms of potential                            delayed complications were discussed with the                            patient. Return to normal activities tomorrow.                            Written discharge instructions were provided to the                            patient.                           - Patient has a contact number available for                            emergencies. The signs and symptoms of potential                            delayed complications were discussed with the                            patient. Return to normal activities tomorrow.                            Written discharge instructions were provided to the                            patient. Procedure Code(s):        --- Professional ---                           [phone], Colonoscopy, flexible; with removal of                            tumor(s), polyp(s), or other lesion(s) by snare                            technique Diagnosis Code(s):        --- Professional ---                           Z12.11, Encounter for screening for malignant                            neoplasm of colon                           D12.2, Benign neoplasm of ascending colon                           K57.30, Diverticulosis of large intestine without  perforation or abscess without bleeding CPT copyright 2016 [employer]. All rights reserved. The codes documented in this report are preliminary and upon coder review may  be revised to meet current compliance requirements. Aviva Signs, MD Aviva Signs, MD 08/30/2015  8:06:16 AM This report has been signed electronically. Number of Addenda: 0

## 2015-08-30 NOTE — Discharge Instructions (Signed)
Colonoscopy, Care After Refer to this sheet in the next few weeks. These instructions provide you with information on caring for yourself after your procedure. Your health care provider may also give you more specific instructions. Your treatment has been planned according to current medical practices, but problems sometimes occur. Call your health care provider if you have any problems or questions after your procedure. WHAT TO EXPECT AFTER THE PROCEDURE  After your procedure, it is typical to have the following:  A small amount of blood in your stool.  Moderate amounts of gas and mild abdominal cramping or bloating. HOME CARE INSTRUCTIONS  Do not drive, operate machinery, or sign important documents for 24 hours.  You may shower and resume your regular physical activities, but move at a slower pace for the first 24 hours.  Take frequent rest periods for the first 24 hours.  Walk around or put a warm pack on your abdomen to help reduce abdominal cramping and bloating.  Drink enough fluids to keep your urine clear or pale yellow.  You may resume your normal diet as instructed by your health care provider. Avoid heavy or fried foods that are hard to digest.  Avoid drinking alcohol for 24 hours or as instructed by your health care provider.  Only take over-the-counter or prescription medicines as directed by your health care provider.  If a tissue sample (biopsy) was taken during your procedure:  Do not take aspirin or blood thinners for 7 days, or as instructed by your health care provider.  Do not drink alcohol for 7 days, or as instructed by your health care provider.  Eat soft foods for the first 24 hours. SEEK MEDICAL CARE IF: You have persistent spotting of blood in your stool 2-3 days after the procedure. SEEK IMMEDIATE MEDICAL CARE IF:  You have more than a small spotting of blood in your stool.  You pass large blood clots in your stool.  Your abdomen is swollen  (distended).  You have nausea or vomiting.  You have a fever.  You have increasing abdominal pain that is not relieved with medicine.   This information is not intended to replace advice given to you by your health care provider. Make sure you discuss any questions you have with your health care provider.   Colon Polyps Polyps are lumps of extra tissue growing inside the body. Polyps can grow in the large intestine (colon). Most colon polyps are noncancerous (benign). However, some colon polyps can become cancerous over time. Polyps that are larger than a pea may be harmful. To be safe, caregivers remove and test all polyps. CAUSES  Polyps form when mutations in the genes cause your cells to grow and divide even though no more tissue is needed. RISK FACTORS There are a number of risk factors that can increase your chances of getting colon polyps. They include: Being older than 50 years. Family history of colon polyps or colon cancer. Long-term colon diseases, such as colitis or Crohn disease. Being overweight. Smoking. Being inactive. Drinking too much alcohol. SYMPTOMS  Most small polyps do not cause symptoms. If symptoms are present, they may include: Blood in the stool. The stool may look dark red or black. Constipation or diarrhea that lasts longer than 1 week. DIAGNOSIS People often do not know they have polyps until their caregiver finds them during a regular checkup. Your caregiver can use 4 tests to check for polyps: Digital rectal exam. The caregiver wears gloves and feels inside the rectum.  This test would find polyps only in the rectum. Barium enema. The caregiver puts a liquid called barium into your rectum before taking X-rays of your colon. Barium makes your colon look white. Polyps are dark, so they are easy to see in the X-ray pictures. Sigmoidoscopy. A thin, flexible tube (sigmoidoscope) is placed into your rectum. The sigmoidoscope has a light and tiny camera in it.  The caregiver uses the sigmoidoscope to look at the last third of your colon. Colonoscopy. This test is like sigmoidoscopy, but the caregiver looks at the entire colon. This is the most common method for finding and removing polyps. TREATMENT  Any polyps will be removed during a sigmoidoscopy or colonoscopy. The polyps are then tested for cancer. PREVENTION  To help lower your risk of getting more colon polyps: Eat plenty of fruits and vegetables. Avoid eating fatty foods. Do not smoke. Avoid drinking alcohol. Exercise every day. Lose weight if recommended by your caregiver. Eat plenty of calcium and folate. Foods that are rich in calcium include milk, cheese, and broccoli. Foods that are rich in folate include chickpeas, kidney beans, and spinach. HOME CARE INSTRUCTIONS Keep all follow-up appointments as directed by your caregiver. You may need periodic exams to check for polyps. SEEK MEDICAL CARE IF: You notice bleeding during a bowel movement.   This information is not intended to replace advice given to you by your health care provider. Make sure you discuss any questions you have with your health care provider.   Diverticulosis Diverticulosis is the condition that develops when small pouches (diverticula) form in the wall of your colon. Your colon, or large intestine, is where water is absorbed and stool is formed. The pouches form when the inside layer of your colon pushes through weak spots in the outer layers of your colon. CAUSES  No one knows exactly what causes diverticulosis. RISK FACTORS Being older than 57. Your risk for this condition increases with age. Diverticulosis is rare in people younger than 40 years. By age 64, almost everyone has it. Eating a low-fiber diet. Being frequently constipated. Being overweight. Not getting enough exercise. Smoking. Taking over-the-counter pain medicines, like aspirin and ibuprofen. SYMPTOMS  Most people with diverticulosis do not  have symptoms. DIAGNOSIS  Because diverticulosis often has no symptoms, health care providers often discover the condition during an exam for other colon problems. In many cases, a health care provider will diagnose diverticulosis while using a flexible scope to examine the colon (colonoscopy). TREATMENT  If you have never developed an infection related to diverticulosis, you may not need treatment. If you have had an infection before, treatment may include: Eating more fruits, vegetables, and grains. Taking a fiber supplement. Taking a live bacteria supplement (probiotic). Taking medicine to relax your colon. HOME CARE INSTRUCTIONS  Drink at least 6-8 glasses of water each day to prevent constipation. Try not to strain when you have a bowel movement. Keep all follow-up appointments. If you have had an infection before: Increase the fiber in your diet as directed by your health care provider or dietitian. Take a dietary fiber supplement if your health care provider approves. Only take medicines as directed by your health care provider. SEEK MEDICAL CARE IF:  You have abdominal pain. You have bloating. You have cramps. You have not gone to the bathroom in 3 days. SEEK IMMEDIATE MEDICAL CARE IF:  Your pain gets worse. Yourbloating becomes very bad. You have a fever or chills, and your symptoms suddenly get worse. You begin  vomiting. You have bowel movements that are bloody or black. MAKE SURE YOU: Understand these instructions. Will watch your condition. Will get help right away if you are not doing well or get worse.   This information is not intended to replace advice given to you by your health care provider. Make sure you discuss any questions you have with your health care provider.

## 2015-08-30 NOTE — Interval H&P Note (Signed)
History and Physical Interval Note:  08/30/2015 7:21 AM  Casey Castillo  has presented today for surgery, with the diagnosis of screening  The various methods of treatment have been discussed with the patient and family. After consideration of risks, benefits and other options for treatment, the patient has consented to  Procedure(s): COLONOSCOPY (N/A) as a surgical intervention .  The patient's history has been reviewed, patient examined, no change in status, stable for surgery.  I have reviewed the patient's chart and labs.  Questions were answered to the patient's satisfaction.     Aviva Signs A

## 2015-09-01 ENCOUNTER — Other Ambulatory Visit: Payer: Self-pay | Admitting: Cardiovascular Disease

## 2015-09-01 MED ORDER — SIMVASTATIN 40 MG PO TABS: 40.0000 mg | ORAL_TABLET | Freq: Every day | ORAL | Status: DC

## 2015-09-01 NOTE — Telephone Encounter (Signed)
Rx(s) sent to pharmacy electronically.  

## 2015-09-01 NOTE — Telephone Encounter (Signed)
°*  STAT* If patient is at the pharmacy, call can be transferred to refill team.   1. Which medications need to be refilled? (please list name of each medication and dose if known) Simvastatin-new prescription-pharmacist told him to call here  2. Which pharmacy/location (including street and city if local pharmacy) is medication to be sent to?[facility]  3. Do they need a 30 day or 90 day supply? 90 and refills

## 2015-09-02 ENCOUNTER — Telehealth: Payer: Self-pay | Admitting: *Deleted

## 2015-09-02 ENCOUNTER — Encounter (HOSPITAL_COMMUNITY): Payer: Self-pay | Admitting: General Surgery

## 2015-09-02 NOTE — Telephone Encounter (Signed)
Simvastatin refill sent to [facility].

## 2015-09-09 DIAGNOSIS — M7541 Impingement syndrome of right shoulder: Secondary | ICD-10-CM | POA: Diagnosis not present

## 2015-09-23 DIAGNOSIS — E782 Mixed hyperlipidemia: Secondary | ICD-10-CM | POA: Diagnosis not present

## 2015-09-23 DIAGNOSIS — R7309 Other abnormal glucose: Secondary | ICD-10-CM | POA: Diagnosis not present

## 2015-09-26 DIAGNOSIS — Z0000 Encounter for general adult medical examination without abnormal findings: Secondary | ICD-10-CM | POA: Diagnosis not present

## 2015-09-26 DIAGNOSIS — E6609 Other obesity due to excess calories: Secondary | ICD-10-CM | POA: Diagnosis not present

## 2015-09-26 DIAGNOSIS — Z6833 Body mass index (BMI) 33.0-33.9, adult: Secondary | ICD-10-CM | POA: Diagnosis not present

## 2015-10-06 DIAGNOSIS — M6200 Separation of muscle (nontraumatic), unspecified site: Secondary | ICD-10-CM | POA: Diagnosis not present

## 2015-10-06 DIAGNOSIS — Z6833 Body mass index (BMI) 33.0-33.9, adult: Secondary | ICD-10-CM | POA: Diagnosis not present

## 2015-10-06 DIAGNOSIS — R7309 Other abnormal glucose: Secondary | ICD-10-CM | POA: Diagnosis not present

## 2015-10-06 DIAGNOSIS — E6609 Other obesity due to excess calories: Secondary | ICD-10-CM | POA: Diagnosis not present

## 2015-10-06 DIAGNOSIS — E781 Pure hyperglyceridemia: Secondary | ICD-10-CM | POA: Diagnosis not present

## 2015-10-06 DIAGNOSIS — Z0000 Encounter for general adult medical examination without abnormal findings: Secondary | ICD-10-CM | POA: Diagnosis not present

## 2015-10-06 DIAGNOSIS — Z0001 Encounter for general adult medical examination with abnormal findings: Secondary | ICD-10-CM | POA: Diagnosis not present

## 2015-10-06 DIAGNOSIS — N400 Enlarged prostate without lower urinary tract symptoms: Secondary | ICD-10-CM | POA: Diagnosis not present

## 2015-10-20 DIAGNOSIS — I872 Venous insufficiency (chronic) (peripheral): Secondary | ICD-10-CM | POA: Diagnosis not present

## 2015-10-20 DIAGNOSIS — X32XXXD Exposure to sunlight, subsequent encounter: Secondary | ICD-10-CM | POA: Diagnosis not present

## 2015-10-20 DIAGNOSIS — L570 Actinic keratosis: Secondary | ICD-10-CM | POA: Diagnosis not present

## 2015-11-11 DIAGNOSIS — R3915 Urgency of urination: Secondary | ICD-10-CM | POA: Diagnosis not present

## 2015-11-11 DIAGNOSIS — N5202 Corporo-venous occlusive erectile dysfunction: Secondary | ICD-10-CM | POA: Diagnosis not present

## 2015-12-27 DIAGNOSIS — R3915 Urgency of urination: Secondary | ICD-10-CM | POA: Diagnosis not present

## 2015-12-27 DIAGNOSIS — N5202 Corporo-venous occlusive erectile dysfunction: Secondary | ICD-10-CM | POA: Diagnosis not present

## 2015-12-27 DIAGNOSIS — N401 Enlarged prostate with lower urinary tract symptoms: Secondary | ICD-10-CM | POA: Diagnosis not present

## 2016-01-03 DIAGNOSIS — M9903 Segmental and somatic dysfunction of lumbar region: Secondary | ICD-10-CM | POA: Diagnosis not present

## 2016-01-03 DIAGNOSIS — M9905 Segmental and somatic dysfunction of pelvic region: Secondary | ICD-10-CM | POA: Diagnosis not present

## 2016-01-03 DIAGNOSIS — M545 Low back pain: Secondary | ICD-10-CM | POA: Diagnosis not present

## 2016-01-03 DIAGNOSIS — M9902 Segmental and somatic dysfunction of thoracic region: Secondary | ICD-10-CM | POA: Diagnosis not present

## 2016-01-04 ENCOUNTER — Telehealth: Payer: Self-pay | Admitting: Cardiovascular Disease

## 2016-01-04 DIAGNOSIS — M9903 Segmental and somatic dysfunction of lumbar region: Secondary | ICD-10-CM | POA: Diagnosis not present

## 2016-01-04 DIAGNOSIS — M9905 Segmental and somatic dysfunction of pelvic region: Secondary | ICD-10-CM | POA: Diagnosis not present

## 2016-01-04 DIAGNOSIS — M545 Low back pain: Secondary | ICD-10-CM | POA: Diagnosis not present

## 2016-01-04 DIAGNOSIS — M9902 Segmental and somatic dysfunction of thoracic region: Secondary | ICD-10-CM | POA: Diagnosis not present

## 2016-01-04 NOTE — Telephone Encounter (Signed)
Ok to DC potassium supplement. MCr

## 2016-01-04 NOTE — Telephone Encounter (Signed)
Returned call to patient. He states that Dr. Loletha Grayer stopped his lasix about 6-7 months ago and he wants to know if he should still take potassium. He states he takes potassium Rx 22mEq QD + potassium OTC (half a pill - maybe 50mg ?). Informed him that lasix 40mg  is still on his med list and that we were not aware he was also taking OTC potassium. He states he is calling in about this as his urologist Rx'ed vesicare and his insurance prefers him to not take potassium w/this.   Will defer to MD to advise  Of note, patient states PCP originally Rx'ed lasix d/t swelling and Dr. Loletha Grayer found out patient had blood clot that was treated thus swelling resolved.

## 2016-01-04 NOTE — Telephone Encounter (Signed)
New message       The pt came off lasix and was wondering if he can come off the potassium, the pt states he is taking a potassium supplement.      Pt c/o medication issue:  1. Name of Medication: potassium 2. How are you currently taking this medication (dosage and times per day)? 20 meq po daily 3. Are you having a reaction (difficulty breathing--STAT)? no  4. What is your medication issue? The pt wants to know if he can come off the potassium

## 2016-01-04 NOTE — Telephone Encounter (Signed)
Patient notified that he can d/c potassium per MD  Med listed updated - lasix and potassium removed

## 2016-01-06 ENCOUNTER — Telehealth: Payer: Self-pay | Admitting: Cardiovascular Disease

## 2016-01-06 DIAGNOSIS — M545 Low back pain: Secondary | ICD-10-CM | POA: Diagnosis not present

## 2016-01-06 DIAGNOSIS — M9903 Segmental and somatic dysfunction of lumbar region: Secondary | ICD-10-CM | POA: Diagnosis not present

## 2016-01-06 DIAGNOSIS — M9905 Segmental and somatic dysfunction of pelvic region: Secondary | ICD-10-CM | POA: Diagnosis not present

## 2016-01-06 DIAGNOSIS — M9902 Segmental and somatic dysfunction of thoracic region: Secondary | ICD-10-CM | POA: Diagnosis not present

## 2016-01-06 NOTE — Telephone Encounter (Signed)
Left generic message for Dr. Fredrik Rigger nurse at [facility] to call us back

## 2016-01-06 NOTE — Telephone Encounter (Signed)
New Message  Pt call requesting to speak with RN. Pt ask that his pharmacist and Urologist be notified that Dr. Sallyanne Kuster has taken pt of Potassium. Pt states Urologist has prescribe pt a new med that will cause a reaction. Pt states they just need to be notified he is off the med. Please call back to discuss if needed  Urologist Dr. Exie Parody [phone] Pharmacy  [facility] [phone]

## 2016-01-09 ENCOUNTER — Telehealth: Payer: Self-pay | Admitting: Cardiovascular Disease

## 2016-01-09 NOTE — Telephone Encounter (Signed)
New message      The pharmacy needs a response back from the letter/form that was sent last Wednesday, on whether or not dispense the medication and the MD is aware of the interaction from the medication

## 2016-01-09 NOTE — Telephone Encounter (Signed)
Left msg for [facility] rep to call.

## 2016-01-11 NOTE — Telephone Encounter (Signed)
Please disregard this encounter.  Pharmacy concerned about interaction with Vesicare and potassium, but you have already told patient to d/c potassium.  I will let his mail order pharmacy know.

## 2016-03-08 DIAGNOSIS — L570 Actinic keratosis: Secondary | ICD-10-CM | POA: Diagnosis not present

## 2016-03-08 DIAGNOSIS — X32XXXD Exposure to sunlight, subsequent encounter: Secondary | ICD-10-CM | POA: Diagnosis not present

## 2016-04-12 ENCOUNTER — Other Ambulatory Visit: Payer: Self-pay

## 2016-04-12 MED ORDER — SIMVASTATIN 40 MG PO TABS: 40.0000 mg | ORAL_TABLET | Freq: Every day | ORAL | 0 refills | Status: DC

## 2016-06-14 DIAGNOSIS — J101 Influenza due to other identified influenza virus with other respiratory manifestations: Secondary | ICD-10-CM | POA: Diagnosis not present

## 2016-06-14 DIAGNOSIS — R509 Fever, unspecified: Secondary | ICD-10-CM | POA: Diagnosis not present

## 2016-06-14 DIAGNOSIS — J111 Influenza due to unidentified influenza virus with other respiratory manifestations: Secondary | ICD-10-CM | POA: Diagnosis not present

## 2016-06-21 DIAGNOSIS — E6609 Other obesity due to excess calories: Secondary | ICD-10-CM | POA: Diagnosis not present

## 2016-06-21 DIAGNOSIS — J111 Influenza due to unidentified influenza virus with other respiratory manifestations: Secondary | ICD-10-CM | POA: Diagnosis not present

## 2016-06-21 DIAGNOSIS — Z6831 Body mass index (BMI) 31.0-31.9, adult: Secondary | ICD-10-CM | POA: Diagnosis not present

## 2016-06-21 DIAGNOSIS — J069 Acute upper respiratory infection, unspecified: Secondary | ICD-10-CM | POA: Diagnosis not present

## 2016-06-21 DIAGNOSIS — Z1389 Encounter for screening for other disorder: Secondary | ICD-10-CM | POA: Diagnosis not present

## 2016-07-05 DIAGNOSIS — H40053 Ocular hypertension, bilateral: Secondary | ICD-10-CM | POA: Diagnosis not present

## 2016-07-17 ENCOUNTER — Encounter (INDEPENDENT_AMBULATORY_CARE_PROVIDER_SITE_OTHER): Payer: PPO | Admitting: Ophthalmology

## 2016-07-17 DIAGNOSIS — H353111 Nonexudative age-related macular degeneration, right eye, early dry stage: Secondary | ICD-10-CM

## 2016-07-17 DIAGNOSIS — H33302 Unspecified retinal break, left eye: Secondary | ICD-10-CM

## 2016-07-17 DIAGNOSIS — H4312 Vitreous hemorrhage, left eye: Secondary | ICD-10-CM | POA: Diagnosis not present

## 2016-07-17 DIAGNOSIS — H43813 Vitreous degeneration, bilateral: Secondary | ICD-10-CM

## 2016-07-17 DIAGNOSIS — H2513 Age-related nuclear cataract, bilateral: Secondary | ICD-10-CM

## 2016-07-17 DIAGNOSIS — H35371 Puckering of macula, right eye: Secondary | ICD-10-CM | POA: Diagnosis not present

## 2016-07-23 ENCOUNTER — Other Ambulatory Visit: Payer: Self-pay | Admitting: Cardiovascular Disease

## 2016-07-23 MED ORDER — SIMVASTATIN 40 MG PO TABS: 40.0000 mg | ORAL_TABLET | Freq: Every day | ORAL | 0 refills | Status: DC

## 2016-07-23 NOTE — Telephone Encounter (Signed)
Patient scheduled to see MD 09/21/15 as he is due for an appointment Rx(s) sent to pharmacy electronically.

## 2016-07-23 NOTE — Telephone Encounter (Signed)
New message    *STAT* If patient is at the pharmacy, call can be transferred to refill team.   1. Which medications need to be refilled? (please list name of each medication and dose if known) simvastatin 40mg  2x day   2. Which pharmacy/location (including street and city if local pharmacy) is medication to be sent to? [facility]   Fax : [phone]  3. Do they need a 30 day or 90 day supply? [place]

## 2016-07-30 ENCOUNTER — Ambulatory Visit (INDEPENDENT_AMBULATORY_CARE_PROVIDER_SITE_OTHER): Payer: PPO | Admitting: Ophthalmology

## 2016-07-30 DIAGNOSIS — H33302 Unspecified retinal break, left eye: Secondary | ICD-10-CM

## 2016-08-06 ENCOUNTER — Ambulatory Visit (INDEPENDENT_AMBULATORY_CARE_PROVIDER_SITE_OTHER): Payer: PPO | Admitting: Ophthalmology

## 2016-08-22 DIAGNOSIS — I2510 Atherosclerotic heart disease of native coronary artery without angina pectoris: Secondary | ICD-10-CM | POA: Diagnosis not present

## 2016-08-22 DIAGNOSIS — E6609 Other obesity due to excess calories: Secondary | ICD-10-CM | POA: Diagnosis not present

## 2016-08-22 DIAGNOSIS — E748 Other specified disorders of carbohydrate metabolism: Secondary | ICD-10-CM | POA: Diagnosis not present

## 2016-08-22 DIAGNOSIS — Z0001 Encounter for general adult medical examination with abnormal findings: Secondary | ICD-10-CM | POA: Diagnosis not present

## 2016-08-22 DIAGNOSIS — E782 Mixed hyperlipidemia: Secondary | ICD-10-CM | POA: Diagnosis not present

## 2016-08-22 DIAGNOSIS — Z6833 Body mass index (BMI) 33.0-33.9, adult: Secondary | ICD-10-CM | POA: Diagnosis not present

## 2016-08-24 DIAGNOSIS — E782 Mixed hyperlipidemia: Secondary | ICD-10-CM | POA: Diagnosis not present

## 2016-08-24 DIAGNOSIS — I10 Essential (primary) hypertension: Secondary | ICD-10-CM | POA: Diagnosis not present

## 2016-08-29 ENCOUNTER — Telehealth: Payer: Self-pay | Admitting: Cardiovascular Disease

## 2016-08-29 NOTE — Telephone Encounter (Signed)
Received records from [place] for appointment with Dr Sallyanne Kuster on 09/20/16 .  Records put with Dr Victorino December schedule on 09/20/16. lp

## 2016-09-20 ENCOUNTER — Encounter: Payer: Self-pay | Admitting: Cardiovascular Disease

## 2016-09-20 ENCOUNTER — Ambulatory Visit (INDEPENDENT_AMBULATORY_CARE_PROVIDER_SITE_OTHER): Payer: PPO | Admitting: Cardiovascular Disease

## 2016-09-20 VITALS — BP 120/70 | HR 76 | Ht 70.0 in | Wt 223.6 lb

## 2016-09-20 DIAGNOSIS — E669 Obesity, unspecified: Secondary | ICD-10-CM | POA: Diagnosis not present

## 2016-09-20 DIAGNOSIS — Z86718 Personal history of other venous thrombosis and embolism: Secondary | ICD-10-CM | POA: Diagnosis not present

## 2016-09-20 DIAGNOSIS — I2510 Atherosclerotic heart disease of native coronary artery without angina pectoris: Secondary | ICD-10-CM | POA: Diagnosis not present

## 2016-09-20 DIAGNOSIS — E785 Hyperlipidemia, unspecified: Secondary | ICD-10-CM

## 2016-09-20 NOTE — Progress Notes (Signed)
Patient ID: Casey Castillo, male   DOB: May 06, 1946, 71 y.o.   MRN: [id number]    Cardiology Office Note    Date:  09/20/2016   ID:  Casey Castillo, DOB March 17, 1946, MRN [id number]  PCP:  Glo Herring, MD  Cardiologist:   Sanda Klein, MD   Chief Complaint  Patient presents with  . Follow-up    History of Present Illness:  Casey Castillo is a 71 y.o. male  With a history of remote DVT of the lower extremity, no longer on anticoagulation as well as a history of coronary atherosclerosis without severe stenoses (remote coronary angiography) and hyperlipidemia.  Casey Castillo has had a good year without any serious health problems. 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 He denies issues with exertional chest pain or dyspnea, but has knee arthritis which slows him down. He has not had any other cardiovascular issues.  He had a normal nuclear stress test in 2011. He is mildly obese.  He blames his girlfriend from being an excellent Optician, dispensing. Unfortunately, she just had to undergo redo sternotomy for bypass surgery (has a previous valve replacement).  On 08/24/2016 he underwent a treadmill stress test in [place]. He was able to exercise for 9 minutes achieving 10 mets and a maximum heart rate of 107% of predicted for age. His blood pressure response was normal and no ischemic ST changes were seen. 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 He reports his triglycerides were high, but cholesterol is okay. He forgot to write you bring a copy.  Past Medical History:  Diagnosis Date  . Coronary artery disease    5%blockage on left anterior descending Dr. Sallyanne Kuster   . Diverticulitis    hx of  . Enlarged prostate    takes flomax  . GERD (gastroesophageal reflux disease)   . History of blood clots     november, 5 1995  . Hyperlipidemia    primary Dr. Selena Batten  . Kidney stone    hx of sees Dr. Chauncey Cruel. [place] [facility]  . Seasonal allergies   . Urinary frequency    hx of     Past Surgical History:  Procedure Laterality Date  . ANTERIOR CERVICAL DECOMP/DISCECTOMY FUSION  01/28/2012   Procedure: ANTERIOR CERVICAL DECOMPRESSION/DISCECTOMY FUSION 2 LEVELS;  Surgeon: Kristeen Miss, MD;  Location: [facility];  Service: Neurosurgery;  Laterality: N/A;  Cervical six-seven, cervical seven-thoracic one Anterior cervical decompression/diskectomy/fusion  . CARDIAC CATHETERIZATION     09-06-11   . CARDIOVASCULAR STRESS TEST     2006 and 2008 at [facility] heart and vascular, 2011 myoview and echo  . COLONOSCOPY    . COLONOSCOPY N/A 08/30/2015   Procedure: COLONOSCOPY;  Surgeon: Aviva Signs, MD;  [facility];  Service: Gastroenterology;  Laterality: N/A;  . HEMORRHOID SURGERY     sept 8, 2008  . KIDNEY STONE SURGERY     lithotripsy 1980's  . KNEE SURGERY     left 04-11-94 ( has had two surgeries to left knee)  . NASAL SINUS SURGERY     Jun 30 1998  . NM MYOVIEW LTD  07/20/2009   no ischemia  . PILONIDAL CYST / SINUS EXCISION     surgery 01-08-74  .  SHOULDER SURGERY     right 12-20-2003  . TRIGGER FINGER RELEASE     03-21-98 right hand ring finger  . US ECHOCARDIOGRAPHY  07/20/2009   LA & RA mildly dilated,trace MR,TR.    Current Outpatient Prescriptions  Medication Sig Dispense Refill  . Cholecalciferol (VITAMIN D) 2000 UNITS CAPS Take 1 capsule by mouth daily.    . clobetasol cream (TEMOVATE) 4.74 % Apply 1 application topically 2 (two) times daily as needed (rash).     . loratadine (CLARITIN) 10 MG tablet Take 10 mg by mouth daily.    . Omega-3 Fatty Acids (FISH OIL) 1200 MG CAPS Take 1 capsule by mouth daily.    . pantoprazole (PROTONIX) 40 MG tablet Take 1 tablet by mouth daily.  2  . simvastatin (ZOCOR) 40 MG tablet Take 1 tablet (40 mg total) by mouth daily. 90 tablet 0  .  tadalafil (CIALIS) 5 MG tablet Take 5 mg by mouth daily as needed for erectile dysfunction.     . Tamsulosin HCl (FLOMAX) 0.4 MG CAPS Take 0.4 mg by mouth daily.     No current facility-administered medications for this visit.     Allergies:   Adhesive [tape] and Other   Social History   Social History  . Marital status: Widowed    Spouse name: N/A  . Number of children: N/A  . Years of education: N/A   Social History Main Topics  . Smoking status: Former Smoker    Packs/day: 1.00    Years: 20.00    Types: Cigarettes  . Smokeless tobacco: Not on file     Comment: stopped 35 years ago  . Alcohol use No  . Drug use: No  . Sexual activity: Not on file   Other Topics Concern  . Not on file   Social History Narrative  . No narrative on file     Family History:  The patient's family history includes Heart attack in his father.   ROS:   Please see the history of present illness.    Review of Systems  Musculoskeletal: Positive for arthritis, joint pain and joint swelling.   All other systems reviewed and are negative.   PHYSICAL EXAM:   VS:  There were no vitals taken for this visit.   GEN: Well nourished, well developed, in no acute distress HEENT: normal Neck: no JVD, carotid bruits, or masses Cardiac: RRR; no murmurs, rubs, or gallops,no edema  Respiratory:  clear to auscultation bilaterally, normal work of breathing GI: soft, nontender, nondistended, + BS MS: no deformity or atrophy Skin: warm and dry, no rash Neuro:  Alert and Oriented x 3, Strength and sensation are intact Psych: euthymic mood, full affect  Wt Readings from Last 3 Encounters:  08/30/15 101.2 kg (223 lb)  05/30/15 103.6 kg (228 lb 7 oz)  05/27/14 104 kg (229 lb 3.2 oz)      Studies/Labs Reviewed:   EKG:  EKG is not ordered today.  The ekg During his stress test demonstrates  Normal sinus rhythm, normal tracing  Recent Labs:  hemoglobin A1c was 5.8%, normal TSH, normal liver function  tests , creatinine 1.3 , hemoglobin 14.4%    Lipid Panel total cholesterol 138, triglycerides 89, HDL 46, LDL 74, 2017    ASSESSMENT:    1. Atherosclerosis of native coronary artery of native heart without angina pectoris   2. Dyslipidemia   3. Mild obesity   4. History of deep vein thrombosis (DVT) of lower extremity  PLAN:  In order of problems listed above:    1. CAD: asymptomatic 2. HLP: He'll send [place] a copy of his lipid profile. Reports that his triglycerides are still high. Triglycerides and relatively low HDL level unlikely to improve without weight loss. LDL less than 100 is satisfactory in the absence of known vascular disease 3. Obesity: Even if he cannot exercise, he should make efforts to lose weight through caloric/carbohydrate restriction. Discussed the low sugar, low refined starch, high protein, high unsaturated fat diet 4. Hx of DVT: Advised to make frequent stops and walk during his long car trips.     Medication Adjustments/Labs and Tests Ordered: Current medicines are reviewed at length with the patient today.  Concerns regarding medicines are outlined above.  Medication changes, Labs and Tests ordered today are listed below. There are no Patient Instructions on file for this visit.  Signed, Sanda Klein, MD  09/20/2016 9:06 AM    [facility] [place], [place], [place]  [address] Phone: [phone]; Fax: [phone]

## 2016-09-20 NOTE — Patient Instructions (Addendum)
Dr Croitoru recommends that you schedule a follow-up appointment in 12 months. You will receive a reminder letter in the mail two months in advance. If you don't receive a letter, please call our office to schedule the follow-up appointment.  If you need a refill on your cardiac medications before your next appointment, please call your pharmacy.   Your physician encouraged you to lose weight for better health. 

## 2016-11-06 ENCOUNTER — Ambulatory Visit (INDEPENDENT_AMBULATORY_CARE_PROVIDER_SITE_OTHER): Payer: PPO | Admitting: Urology

## 2016-11-06 DIAGNOSIS — N5201 Erectile dysfunction due to arterial insufficiency: Secondary | ICD-10-CM | POA: Diagnosis not present

## 2016-11-06 DIAGNOSIS — N401 Enlarged prostate with lower urinary tract symptoms: Secondary | ICD-10-CM

## 2016-11-16 ENCOUNTER — Other Ambulatory Visit: Payer: Self-pay

## 2016-11-16 MED ORDER — SIMVASTATIN 40 MG PO TABS: 40.0000 mg | ORAL_TABLET | Freq: Every day | ORAL | 3 refills | Status: DC

## 2016-11-19 DIAGNOSIS — F4312 Post-traumatic stress disorder, chronic: Secondary | ICD-10-CM | POA: Diagnosis not present

## 2016-11-29 ENCOUNTER — Ambulatory Visit (INDEPENDENT_AMBULATORY_CARE_PROVIDER_SITE_OTHER): Payer: PPO | Admitting: Ophthalmology

## 2016-11-29 DIAGNOSIS — H43813 Vitreous degeneration, bilateral: Secondary | ICD-10-CM

## 2016-11-29 DIAGNOSIS — H33301 Unspecified retinal break, right eye: Secondary | ICD-10-CM | POA: Diagnosis not present

## 2016-11-29 DIAGNOSIS — H35371 Puckering of macula, right eye: Secondary | ICD-10-CM

## 2016-12-10 DIAGNOSIS — R432 Parageusia: Secondary | ICD-10-CM | POA: Diagnosis not present

## 2016-12-10 DIAGNOSIS — J310 Chronic rhinitis: Secondary | ICD-10-CM | POA: Diagnosis not present

## 2016-12-17 DIAGNOSIS — M25512 Pain in left shoulder: Secondary | ICD-10-CM | POA: Diagnosis not present

## 2016-12-17 DIAGNOSIS — S139XXA Sprain of joints and ligaments of unspecified parts of neck, initial encounter: Secondary | ICD-10-CM | POA: Diagnosis not present

## 2016-12-31 DIAGNOSIS — F4312 Post-traumatic stress disorder, chronic: Secondary | ICD-10-CM | POA: Diagnosis not present

## 2017-01-01 DIAGNOSIS — M25512 Pain in left shoulder: Secondary | ICD-10-CM | POA: Diagnosis not present

## 2017-01-18 ENCOUNTER — Other Ambulatory Visit: Payer: Self-pay | Admitting: Sports Medicine

## 2017-01-18 DIAGNOSIS — M5412 Radiculopathy, cervical region: Secondary | ICD-10-CM

## 2017-01-21 ENCOUNTER — Other Ambulatory Visit: Payer: Self-pay | Admitting: Sports Medicine

## 2017-01-21 DIAGNOSIS — M5412 Radiculopathy, cervical region: Secondary | ICD-10-CM

## 2017-01-21 DIAGNOSIS — Z77018 Contact with and (suspected) exposure to other hazardous metals: Secondary | ICD-10-CM

## 2017-01-23 ENCOUNTER — Ambulatory Visit
Admission: RE | Admit: 2017-01-23 | Discharge: 2017-01-23 | Disposition: A | Discharge status: Home / Self Care | Payer: PPO | Source: Ambulatory Visit | Attending: Sports Medicine | Admitting: Sports Medicine

## 2017-01-23 DIAGNOSIS — M5412 Radiculopathy, cervical region: Secondary | ICD-10-CM

## 2017-01-23 DIAGNOSIS — Z135 Encounter for screening for eye and ear disorders: Secondary | ICD-10-CM | POA: Diagnosis not present

## 2017-01-23 DIAGNOSIS — M5022 Other cervical disc displacement, mid-cervical region, unspecified level: Secondary | ICD-10-CM | POA: Diagnosis not present

## 2017-01-23 DIAGNOSIS — Z77018 Contact with and (suspected) exposure to other hazardous metals: Secondary | ICD-10-CM

## 2017-01-31 DIAGNOSIS — M4722 Other spondylosis with radiculopathy, cervical region: Secondary | ICD-10-CM | POA: Diagnosis not present

## 2017-01-31 DIAGNOSIS — M5412 Radiculopathy, cervical region: Secondary | ICD-10-CM | POA: Diagnosis not present

## 2017-01-31 DIAGNOSIS — Z6831 Body mass index (BMI) 31.0-31.9, adult: Secondary | ICD-10-CM | POA: Diagnosis not present

## 2017-02-01 DIAGNOSIS — M6281 Muscle weakness (generalized): Secondary | ICD-10-CM | POA: Diagnosis not present

## 2017-02-01 DIAGNOSIS — M542 Cervicalgia: Secondary | ICD-10-CM | POA: Diagnosis not present

## 2017-02-01 DIAGNOSIS — M79602 Pain in left arm: Secondary | ICD-10-CM | POA: Diagnosis not present

## 2017-02-01 DIAGNOSIS — M5413 Radiculopathy, cervicothoracic region: Secondary | ICD-10-CM | POA: Diagnosis not present

## 2017-02-04 DIAGNOSIS — M542 Cervicalgia: Secondary | ICD-10-CM | POA: Diagnosis not present

## 2017-02-04 DIAGNOSIS — M79602 Pain in left arm: Secondary | ICD-10-CM | POA: Diagnosis not present

## 2017-02-04 DIAGNOSIS — M6281 Muscle weakness (generalized): Secondary | ICD-10-CM | POA: Diagnosis not present

## 2017-02-04 DIAGNOSIS — M5413 Radiculopathy, cervicothoracic region: Secondary | ICD-10-CM | POA: Diagnosis not present

## 2017-02-05 DIAGNOSIS — M79602 Pain in left arm: Secondary | ICD-10-CM | POA: Diagnosis not present

## 2017-02-05 DIAGNOSIS — M6281 Muscle weakness (generalized): Secondary | ICD-10-CM | POA: Diagnosis not present

## 2017-02-05 DIAGNOSIS — M5413 Radiculopathy, cervicothoracic region: Secondary | ICD-10-CM | POA: Diagnosis not present

## 2017-02-05 DIAGNOSIS — M542 Cervicalgia: Secondary | ICD-10-CM | POA: Diagnosis not present

## 2017-02-12 ENCOUNTER — Encounter (HOSPITAL_COMMUNITY): Payer: Self-pay

## 2017-02-12 ENCOUNTER — Ambulatory Visit (HOSPITAL_COMMUNITY): Payer: PPO | Attending: Neurological Surgery

## 2017-02-12 DIAGNOSIS — M5412 Radiculopathy, cervical region: Secondary | ICD-10-CM | POA: Diagnosis not present

## 2017-02-12 DIAGNOSIS — M6281 Muscle weakness (generalized): Secondary | ICD-10-CM | POA: Diagnosis not present

## 2017-02-12 DIAGNOSIS — R293 Abnormal posture: Secondary | ICD-10-CM | POA: Diagnosis not present

## 2017-02-12 NOTE — Therapy (Signed)
[place], [place], [address] Phone: [phone]   Fax:  [phone]  Physical Therapy Evaluation  Patient Details  Name: Casey Castillo MRN: [id number] Date of Birth: 11-13-1945 Referring Provider: Kristeen Miss, MD  Encounter Date: 02/12/2017      PT End of Session - 02/12/17 1649    Visit Number 1   Number of Visits 13   Date for PT Re-Evaluation 03/05/17   Authorization Type [employer]   Authorization Time Period 02/12/17 to 03/26/17   Authorization - Visit Number 1   Authorization - Number of Visits 10   PT Start Time 9371   PT Stop Time 1636   PT Time Calculation (min) 40 min   Activity Tolerance Patient tolerated treatment well;No increased pain   Behavior During Therapy WFL for tasks assessed/performed      Past Medical History:  Diagnosis Date  . Coronary artery disease    5%blockage on left anterior descending Dr. Sallyanne Kuster   . Diverticulitis    hx of  . Enlarged prostate    takes flomax  . GERD (gastroesophageal reflux disease)   . History of blood clots    november, 5 1995  . Hyperlipidemia    primary Dr. Selena Batten  . Kidney stone    hx of sees Dr. Chauncey Cruel. [place] [facility]  . Seasonal allergies   . Urinary frequency    hx of     Past Surgical History:  Procedure Laterality Date  . ANTERIOR CERVICAL DECOMP/DISCECTOMY FUSION  01/28/2012   Procedure: ANTERIOR CERVICAL DECOMPRESSION/DISCECTOMY FUSION 2 LEVELS;  Surgeon: Kristeen Miss, MD;  Location: [facility];  Service: Neurosurgery;  Laterality: N/A;  Cervical six-seven, cervical seven-thoracic one Anterior cervical decompression/diskectomy/fusion  . CARDIAC CATHETERIZATION     09-06-11   . CARDIOVASCULAR STRESS TEST     2006 and 2008 at [facility] heart and vascular, 2011 myoview and echo  . COLONOSCOPY    . COLONOSCOPY N/A 08/30/2015   Procedure: COLONOSCOPY;  Surgeon: Aviva Signs, MD;  [facility];  Service:  Gastroenterology;  Laterality: N/A;  . HEMORRHOID SURGERY     sept 8, 2008  . KIDNEY STONE SURGERY     lithotripsy 1980's  . KNEE SURGERY     left 04-11-94 ( has had two surgeries to left knee)  . NASAL SINUS SURGERY     Jun 30 1998  . NM MYOVIEW LTD  07/20/2009   no ischemia  . PILONIDAL CYST / SINUS EXCISION     surgery 01-08-74  . SHOULDER SURGERY     right 12-20-2003  . TRIGGER FINGER RELEASE     03-21-98 right hand ring finger  . US ECHOCARDIOGRAPHY  07/20/2009   LA & RA mildly dilated,trace MR,TR.    There were no vitals filed for this visit.       Subjective Assessment - 02/12/17 1559    Subjective Pt states that he has been having numbness and weakness in his LUE, primarily located on his thumb, but it is also in his index and middle fingers some. Pt states that he has also been having issues with his L grip strength. He is L handed. He states that he had an ACDF back in 2013 due to having the same issues in his L hand; his symptoms were resolved completely following the surgery. His symptoms returned about 2 months ago which came on progressively without trauma. According to Dr. Ellene Route, he has a lot of  deterioration in his neck which is putting pressure on the nerve. He had been going to PT elsewhere but they were out of his network so he decided to change clinics. He is staying up to date with the HEP they provided him. He has no neck pain, no arm pain, just the numbness and tingling and issues with grip strength.   Limitations Lifting;House hold activities   How long can you sit comfortably? no issues   How long can you stand comfortably? no issues   How long can you walk comfortably? no issues   Diagnostic tests MRI   Patient Stated Goals get strength back and ease numbness   Currently in Pain? No/denies            [facility] PT Assessment - 02/12/17 0001      Assessment   Medical Diagnosis cervical spondylosis with radiculopathy   Referring Provider Kristeen Miss, MD    Onset Date/Surgical Date --  about 2 months ago   Next MD Visit 03/14/2017   Prior Therapy yes self-discharged from another local clinic due to that clinic not being in his insurance network     Balance Screen   Has the patient fallen in the past 6 months No   Has the patient had a decrease in activity level because of a fear of falling?  No   Is the patient reluctant to leave their home because of a fear of falling?  No     Prior Function   Level of Independence Independent;Independent with basic ADLs   Vocation Retired   Leisure dancing, golfing, working on old cars and truck     Cognition   Overall Cognitive Status Within Functional Limits for tasks assessed     Sensation   Light Touch Impaired Detail   Light Touch Impaired Details Impaired LUE   Additional Comments LUE: C6 increased tingling with light touch     Posture/Postural Control   Posture/Postural Control Postural limitations   Postural Limitations Rounded Shoulders;Forward head;Increased thoracic kyphosis     ROM / Strength   AROM / PROM / Strength AROM;Strength     AROM   Overall AROM Comments WFL, non-painful, no change in LUE symptoms   AROM Assessment Site Cervical     Strength   Overall Strength Comments thumb ext 5/5 on R, 4/5 on L; hand intrinsics 5/5 on R, 4/5 on L; pincher grip: 23# on R, 11# on L   Right Shoulder Flexion 5/5   Right Shoulder ABduction 5/5   Right Shoulder Internal Rotation 5/5   Right Shoulder External Rotation 5/5   Left Shoulder Flexion 5/5   Left Shoulder ABduction 4+/5   Left Shoulder Internal Rotation 5/5   Left Shoulder External Rotation 5/5   Right Elbow Flexion 5/5   Right Elbow Extension 5/5   Left Elbow Flexion 5/5   Left Elbow Extension 5/5   Right Wrist Flexion 5/5   Right Wrist Extension 5/5   Left Wrist Flexion 5/5   Left Wrist Extension 5/5   Right Hand Gross Grasp Functional   Right Hand Grip (lbs) 78   Left Hand Gross Grasp Impaired   Left Hand Grip (lbs)  62     Palpation   Palpation comment soft tissue restrictions of periscapular musculature but non-painful to palpation     Special Tests    Special Tests Cervical   Cervical Tests other;other2     other    Findings Positive   Side Left  Comment Medial nerve glide: (+) mild reproduction of symptoms     other    Findings Positive   Side Left   Comment Radial nerve glide: (+) mild reproduction of symptoms        Objective measurements completed on examination: See above findings.           PT Education - 02/12/17 1648    Education provided Yes   Education Details exam findings, POC, HEP   Person(s) Educated Patient   Methods Explanation;Demonstration   Comprehension Verbalized understanding;Returned demonstration          PT Short Term Goals - 02/12/17 1713      PT SHORT TERM GOAL #1   Title Pt will be independent with HEP and perform consistently in order to maximize his overall function.   Time 3   Period Weeks   Status New   Target Date 03/05/17     PT SHORT TERM GOAL #2   Title Pt will have improved strength by at least 5# in his gross grasp and pincher grip strength in order to allow him to clip his fingernails with greater ease.   Time 3   Period Weeks   Status New           PT Long Term Goals - 02/12/17 1715      PT LONG TERM GOAL #1   Title Pt will have improved strength by at least 10# in his gross grasp and pincher grip strength to maximize his ability to play golf.   Time 6   Period Weeks   Status New   Target Date 03/26/17     PT LONG TERM GOAL #2   Title Pt will report decreased numbness or tinging in his L thumb to demonstrate improved overall function and maximize ADLs at home.   Time 6   Period Weeks   Status New     PT LONG TERM GOAL #3   Title Pt will have negative nerve glide testing for radial and median nerves to demonstrate improved overall function.    Time 6   Period Weeks   Status New                 Plan - 02/12/17 1657    Clinical Impression Statement Pt is pleasant 71 YO M who presents to OPPT with c/o LUE numbness and tingling as well as deficits in L grip strength. Pt had this same issue years ago in his L little finger which led to him needing a ACDF of C7 to T1 which completely resolved his symptoms. His symptoms returned about 2 months ago of insidious onset without trauma mainly in his L thumb. Pt demonstrates s/s consistent with C6 radiculopathy as he demonstrated impaired light touch and significant muscle weakness of C6 dermatome and myotome. His gross grip and pincher grip strength are also significantly limited in his L hand compared to the R. Pt mildly positive with radial and median nerve glide testing, reporting tingling. He also has deficits in posture and mild deficits in cervical AROM. He was attending therapy at another clinic but due to it being out of network for his insurance, he decided to change locations. He demonstrated his current HEP this date and PT told pt to continue with what he was doing and provided putty for improved grip strength. Pt needs skilled PT intervention to address these deficits in order to maximize his overall function.   History and Personal Factors relevant to plan of  care: motivated, h/o current issue, CAD, hyperlipidemia, ACDF in 2013 for same issue   Clinical Presentation Stable   Clinical Presentation due to: MMT, sensation, +nerve glide testing, grip strength, pincher grip strength   Clinical Decision Making Low   Rehab Potential Good   PT Frequency 2x / week   PT Duration 6 weeks   PT Treatment/Interventions ADLs/Self Care Home Management;Cryotherapy;Electrical Stimulation;Moist Heat;Traction;Therapeutic activities;Therapeutic exercise;Neuromuscular re-education;Patient/family education;Manual techniques;Passive range of motion;Dry needling;Taping   PT Next Visit Plan review goals and HEP, nerve flossing with head movements, grip strength,  posture education and strengthening   PT Home Exercise Plan eval: self median nerve glide, supine cervical retractions, pink putty   Consulted and Agree with Plan of Care Patient      Patient will benefit from skilled therapeutic intervention in order to improve the following deficits and impairments:  Decreased strength, Hypomobility, Decreased range of motion, Increased muscle spasms, Impaired sensation, Impaired UE functional use, Postural dysfunction  Visit Diagnosis: Muscle weakness (generalized) - Plan: PT plan of care cert/re-cert  Radiculopathy, cervical region - Plan: PT plan of care cert/re-cert  Abnormal posture - Plan: PT plan of care cert/re-cert      G-Codes - 40/10/27 1724    Functional Limitation Carrying, moving and handling objects   Carrying, Moving and Handling Objects Current Status (O5366) At least 40 percent but less than 60 percent impaired, limited or restricted   Carrying, Moving and Handling Objects Goal Status (Y4034) At least 1 percent but less than 20 percent impaired, limited or restricted       Problem List Patient Active Problem List   Diagnosis Date Noted  . Mild obesity 09/20/2016  . Edema of both legs 01/17/2013  . Long term current use of anticoagulant therapy 12/30/2012  . History of deep vein thrombosis (DVT) of lower extremity 12/26/2012  . Dyslipidemia 12/26/2012  . Coronary atherosclerosis without stenosis 12/26/2012       Geraldine Solar PT, DPT  [facility] [address] [place], [place], [address] Phone: [phone]   Fax:  [phone]  Name: Casey Castillo MRN: [id number] Date of Birth: 1946-04-14

## 2017-02-14 DIAGNOSIS — E748 Other specified disorders of carbohydrate metabolism: Secondary | ICD-10-CM | POA: Diagnosis not present

## 2017-02-14 DIAGNOSIS — E782 Mixed hyperlipidemia: Secondary | ICD-10-CM | POA: Diagnosis not present

## 2017-02-15 ENCOUNTER — Ambulatory Visit (HOSPITAL_COMMUNITY): Payer: PPO

## 2017-02-15 DIAGNOSIS — M5412 Radiculopathy, cervical region: Secondary | ICD-10-CM

## 2017-02-15 DIAGNOSIS — M6281 Muscle weakness (generalized): Secondary | ICD-10-CM | POA: Diagnosis not present

## 2017-02-15 DIAGNOSIS — R293 Abnormal posture: Secondary | ICD-10-CM

## 2017-02-15 NOTE — Therapy (Signed)
[place], [place], [address] Phone: [phone]   Fax:  [phone]  Physical Therapy Treatment  Patient Details  Name: Casey Castillo MRN: [id number] Date of Birth: 11-06-1945 Referring Provider: Kristeen Miss, MD  Encounter Date: 02/15/2017      PT End of Session - 02/15/17 1741    Visit Number 2   Number of Visits 13   Date for PT Re-Evaluation 03/05/17   Authorization Type [employer]   Authorization Time Period 02/12/17 to 03/26/17   Authorization - Visit Number 2   Authorization - Number of Visits 10   PT Start Time 7867   PT Stop Time 6720   PT Time Calculation (min) 41 min   Activity Tolerance Patient tolerated treatment well;No increased pain   Behavior During Therapy WFL for tasks assessed/performed      Past Medical History:  Diagnosis Date  . Coronary artery disease    5%blockage on left anterior descending Dr. Sallyanne Kuster   . Diverticulitis    hx of  . Enlarged prostate    takes flomax  . GERD (gastroesophageal reflux disease)   . History of blood clots    november, 5 1995  . Hyperlipidemia    primary Dr. Selena Batten  . Kidney stone    hx of sees Dr. Chauncey Cruel. [place] [facility]  . Seasonal allergies   . Urinary frequency    hx of     Past Surgical History:  Procedure Laterality Date  . ANTERIOR CERVICAL DECOMP/DISCECTOMY FUSION  01/28/2012   Procedure: ANTERIOR CERVICAL DECOMPRESSION/DISCECTOMY FUSION 2 LEVELS;  Surgeon: Kristeen Miss, MD;  Location: [facility];  Service: Neurosurgery;  Laterality: N/A;  Cervical six-seven, cervical seven-thoracic one Anterior cervical decompression/diskectomy/fusion  . CARDIAC CATHETERIZATION     09-06-11   . CARDIOVASCULAR STRESS TEST     2006 and 2008 at [facility] heart and vascular, 2011 myoview and echo  . COLONOSCOPY    . COLONOSCOPY N/A 08/30/2015   Procedure: COLONOSCOPY;  Surgeon: Aviva Signs, MD;  [facility];  Service:  Gastroenterology;  Laterality: N/A;  . HEMORRHOID SURGERY     sept 8, 2008  . KIDNEY STONE SURGERY     lithotripsy 1980's  . KNEE SURGERY     left 04-11-94 ( has had two surgeries to left knee)  . NASAL SINUS SURGERY     Jun 30 1998  . NM MYOVIEW LTD  07/20/2009   no ischemia  . PILONIDAL CYST / SINUS EXCISION     surgery 01-08-74  . SHOULDER SURGERY     right 12-20-2003  . TRIGGER FINGER RELEASE     03-21-98 right hand ring finger  . US ECHOCARDIOGRAPHY  07/20/2009   LA & RA mildly dilated,trace MR,TR.    There were no vitals filed for this visit.      Subjective Assessment - 02/15/17 1738    Subjective Pt reports he continues to have numbness down LUE to thumb.  Reports compliance wiht HEP wihtout questions.     Patient Stated Goals get strength back and ease numbness   Currently in Pain? No/denies  Numbness and weakness Lt UE             [facility] Adult PT Treatment/Exercise - 02/15/17 0001      Exercises   Exercises Neck     Neck Exercises: Seated   Neck Retraction 10 reps;5 secs   X to V 5 reps   X to V  Limitations caution with Rt shoulder   W Back 10 reps   Other Seated Exercise medial nerve glide 10x   Other Seated Exercise retreive small and large beads from yellow putty     Neck Exercises: Supine   Neck Retraction 20 reps;5 secs                PT Education - 02/15/17 1802    Education provided Yes   Education Details Reviewed goals, assured compliance iwht HEP, copy of eval given to pt.  Educated importance of posture for pain and radicular symptoms.     Person(s) Educated Patient   Methods Explanation;Demonstration;Handout   Comprehension Verbalized understanding;Returned demonstration;Need further instruction          PT Short Term Goals - 02/12/17 1713      PT SHORT TERM GOAL #1   Title Pt will be independent with HEP and perform consistently in order to maximize his overall function.   Time 3   Period Weeks   Status New   Target Date  03/05/17     PT SHORT TERM GOAL #2   Title Pt will have improved strength by at least 5# in his gross grasp and pincher grip strength in order to allow him to clip his fingernails with greater ease.   Time 3   Period Weeks   Status New           PT Long Term Goals - 02/12/17 1715      PT LONG TERM GOAL #1   Title Pt will have improved strength by at least 10# in his gross grasp and pincher grip strength to maximize his ability to play golf.   Time 6   Period Weeks   Status New   Target Date 03/26/17     PT LONG TERM GOAL #2   Title Pt will report decreased numbness or tinging in his L thumb to demonstrate improved overall function and maximize ADLs at home.   Time 6   Period Weeks   Status New     PT LONG TERM GOAL #3   Title Pt will have negative nerve glide testing for radial and median nerves to demonstrate improved overall function.    Time 6   Period Weeks   Status New               Plan - 02/15/17 1752    Clinical Impression Statement Reviewed goals, assured compliance with HEP and copy of eval given to pt.  Pt educated on importance of posture to reduce pain and radicular symptoms down Lt UE.  Min cueing to improve form and technique wiht cervical retraction.  Postural strengthening therex complete with min cueing for forward head and grip strengthening exercises complete with putty.       Rehab Potential Good   PT Frequency 2x / week   PT Duration 6 weeks   PT Treatment/Interventions ADLs/Self Care Home Management;Cryotherapy;Electrical Stimulation;Moist Heat;Traction;Therapeutic activities;Therapeutic exercise;Neuromuscular re-education;Patient/family education;Manual techniques;Passive range of motion;Dry needling;Taping   PT Next Visit Plan nerve flossing with head movements, grip strength, posture education and strengthening.  Next session review form with theraband postural strengthening (complete at other PT clinic, use blue band).  Progress grip  strength as able.     PT Home Exercise Plan eval: self median nerve glide, supine cervical retractions, pink putty      Patient will benefit from skilled therapeutic intervention in order to improve the following deficits and impairments:  Decreased strength, Hypomobility, Decreased  range of motion, Increased muscle spasms, Impaired sensation, Impaired UE functional use, Postural dysfunction  Visit Diagnosis: Radiculopathy, cervical region  Muscle weakness (generalized)  Abnormal posture     Problem List Patient Active Problem List   Diagnosis Date Noted  . Mild obesity 09/20/2016  . Edema of both legs 01/17/2013  . Long term current use of anticoagulant therapy 12/30/2012  . History of deep vein thrombosis (DVT) of lower extremity 12/26/2012  . Dyslipidemia 12/26/2012  . Coronary atherosclerosis without stenosis 12/26/2012   Ihor Austin, LPTA; [place]  Aldona Lento 02/15/2017, 6:26 PM  [facility] [address] [place], [place], [address] Phone: [phone]   Fax:  [phone]  Name: ROYER CRISTOBAL MRN: [id number] Date of Birth: Jul 22, 1945

## 2017-02-18 DIAGNOSIS — R430 Anosmia: Secondary | ICD-10-CM | POA: Diagnosis not present

## 2017-02-18 DIAGNOSIS — R432 Parageusia: Secondary | ICD-10-CM | POA: Diagnosis not present

## 2017-02-18 DIAGNOSIS — J310 Chronic rhinitis: Secondary | ICD-10-CM | POA: Diagnosis not present

## 2017-02-19 ENCOUNTER — Encounter (HOSPITAL_COMMUNITY): Payer: Self-pay

## 2017-02-19 ENCOUNTER — Ambulatory Visit (HOSPITAL_COMMUNITY): Payer: PPO

## 2017-02-19 DIAGNOSIS — M5412 Radiculopathy, cervical region: Secondary | ICD-10-CM

## 2017-02-19 DIAGNOSIS — M6281 Muscle weakness (generalized): Secondary | ICD-10-CM

## 2017-02-19 DIAGNOSIS — R293 Abnormal posture: Secondary | ICD-10-CM

## 2017-02-19 NOTE — Therapy (Addendum)
[place], [place], [address] Phone: [phone]   Fax:  [phone]  Physical Therapy Treatment  Patient Details  Name: Casey Castillo MRN: [id number] Date of Birth: 09/06/1945 Referring Provider: Kristeen Miss, MD  Encounter Date: 02/19/2017      PT End of Session - 02/19/17 0816    Visit Number 3   Number of Visits 13   Date for PT Re-Evaluation 03/05/17   Authorization Type [employer]   Authorization Time Period 02/12/17 to 03/26/17   Authorization - Visit Number 3   Authorization - Number of Visits 10   PT Start Time 0815   PT Stop Time 3419   PT Time Calculation (min) 39 min   Activity Tolerance Patient tolerated treatment well;No increased pain   Behavior During Therapy WFL for tasks assessed/performed      Past Medical History:  Diagnosis Date  . Coronary artery disease    5%blockage on left anterior descending Dr. Sallyanne Kuster   . Diverticulitis    hx of  . Enlarged prostate    takes flomax  . GERD (gastroesophageal reflux disease)   . History of blood clots    november, 5 1995  . Hyperlipidemia    primary Dr. Selena Batten  . Kidney stone    hx of sees Dr. Chauncey Cruel. [place] [facility]  . Seasonal allergies   . Urinary frequency    hx of     Past Surgical History:  Procedure Laterality Date  . ANTERIOR CERVICAL DECOMP/DISCECTOMY FUSION  01/28/2012   Procedure: ANTERIOR CERVICAL DECOMPRESSION/DISCECTOMY FUSION 2 LEVELS;  Surgeon: Kristeen Miss, MD;  Location: [facility];  Service: Neurosurgery;  Laterality: N/A;  Cervical six-seven, cervical seven-thoracic one Anterior cervical decompression/diskectomy/fusion  . CARDIAC CATHETERIZATION     09-06-11   . CARDIOVASCULAR STRESS TEST     2006 and 2008 at [facility] heart and vascular, 2011 myoview and echo  . COLONOSCOPY    . COLONOSCOPY N/A 08/30/2015   Procedure: COLONOSCOPY;  Surgeon: Aviva Signs, MD;  [facility];  Service:  Gastroenterology;  Laterality: N/A;  . HEMORRHOID SURGERY     sept 8, 2008  . KIDNEY STONE SURGERY     lithotripsy 1980's  . KNEE SURGERY     left 04-11-94 ( has had two surgeries to left knee)  . NASAL SINUS SURGERY     Jun 30 1998  . NM MYOVIEW LTD  07/20/2009   no ischemia  . PILONIDAL CYST / SINUS EXCISION     surgery 01-08-74  . SHOULDER SURGERY     right 12-20-2003  . TRIGGER FINGER RELEASE     03-21-98 right hand ring finger  . US ECHOCARDIOGRAPHY  07/20/2009   LA & RA mildly dilated,trace MR,TR.    There were no vitals filed for this visit.      Subjective Assessment - 02/19/17 0817    Subjective Pt states that he feels like his numbness is getting better. It still flares up but he doesn't feel it as much.    Patient Stated Goals get strength back and ease numbness   Currently in Pain? No/denies  tingling in L thumb and lateral index finger             [facility] Adult PT Treatment/Exercise - 02/19/17 0001      Neck Exercises: Standing   Wall Push Ups 10 reps   Wall Push Ups Limitations 2 sets, with BTB   UE D2 Limitations  2x10 with BTB maintaining cervical retraction   Other Standing Exercises shoulder taps in modified plantigrade 2x10 reps maintaining cervical retraction; self-median nerve glides x15 reps      Neck Exercises: Seated   Other Seated Exercise scap retraction and low rows maintaining cervical retraction and with lumbar roll 2x10 with BTB   Other Seated Exercise band pulls with BTB 2x10 reps     Neck Exercises: Supine   Neck Retraction 20 reps;5 secs     Manual Therapy   Manual Therapy Joint mobilization;Neural Stretch   Manual therapy comments completed separate rest of treatment   Joint Mobilization Grade 3-4 spinal mobs to C5-7    Neural Stretch L median nerve flossing with head movements x10 reps                PT Short Term Goals - 02/12/17 1713      PT SHORT TERM GOAL #1   Title Pt will be independent with HEP and perform  consistently in order to maximize his overall function.   Time 3   Period Weeks   Status New   Target Date 03/05/17     PT SHORT TERM GOAL #2   Title Pt will have improved strength by at least 5# in his gross grasp and pincher grip strength in order to allow him to clip his fingernails with greater ease.   Time 3   Period Weeks   Status New           PT Long Term Goals - 02/12/17 1715      PT LONG TERM GOAL #1   Title Pt will have improved strength by at least 10# in his gross grasp and pincher grip strength to maximize his ability to play golf.   Time 6   Period Weeks   Status New   Target Date 03/26/17     PT LONG TERM GOAL #2   Title Pt will report decreased numbness or tinging in his L thumb to demonstrate improved overall function and maximize ADLs at home.   Time 6   Period Weeks   Status New     PT LONG TERM GOAL #3   Title Pt will have negative nerve glide testing for radial and median nerves to demonstrate improved overall function.    Time 6   Period Weeks   Status New               Plan - 02/19/17 0855    Clinical Impression Statement Session focused on improving postural strengthening and improving mobility. He tolerated manual very well. He did c/o increased n/t at EOS after completing the shoulder taps. Had pt f/u with self-median nerve glides and he reported decreased n/t. Progress postural strengthening to standing next date.    Rehab Potential Good   PT Frequency 2x / week   PT Duration 6 weeks   PT Treatment/Interventions ADLs/Self Care Home Management;Cryotherapy;Electrical Stimulation;Moist Heat;Traction;Therapeutic activities;Therapeutic exercise;Neuromuscular re-education;Patient/family education;Manual techniques;Passive range of motion;Dry needling;Taping   PT Next Visit Plan nerve flossing with head movements; continue to address and progress grip strength, posture education and strengthening. Perform standing postural strengthening,  continue joint mobs, trial traction within next few sessions; update HEP   PT Home Exercise Plan eval: self median nerve glide, supine cervical retractions, pink putty   Consulted and Agree with Plan of Care Patient      Patient will benefit from skilled therapeutic intervention in order to improve the following deficits and impairments:  Decreased  strength, Hypomobility, Decreased range of motion, Increased muscle spasms, Impaired sensation, Impaired UE functional use, Postural dysfunction  Visit Diagnosis: Radiculopathy, cervical region  Muscle weakness (generalized)  Abnormal posture     Problem List Patient Active Problem List   Diagnosis Date Noted  . Mild obesity 09/20/2016  . Edema of both legs 01/17/2013  . Long term current use of anticoagulant therapy 12/30/2012  . History of deep vein thrombosis (DVT) of lower extremity 12/26/2012  . Dyslipidemia 12/26/2012  . Coronary atherosclerosis without stenosis 12/26/2012       Geraldine Solar PT, DPT  [facility] [address] [place], [place], [address] Phone: [phone]   Fax:  [phone]  Name: Casey Castillo MRN: [id number] Date of Birth: 1945-10-15

## 2017-02-21 ENCOUNTER — Ambulatory Visit (HOSPITAL_COMMUNITY): Payer: PPO | Admitting: Physical Therapy

## 2017-02-21 ENCOUNTER — Encounter (HOSPITAL_COMMUNITY): Payer: Self-pay | Admitting: Physical Therapy

## 2017-02-21 DIAGNOSIS — M6281 Muscle weakness (generalized): Secondary | ICD-10-CM | POA: Diagnosis not present

## 2017-02-21 DIAGNOSIS — F4312 Post-traumatic stress disorder, chronic: Secondary | ICD-10-CM | POA: Diagnosis not present

## 2017-02-21 DIAGNOSIS — R293 Abnormal posture: Secondary | ICD-10-CM

## 2017-02-21 DIAGNOSIS — M5412 Radiculopathy, cervical region: Secondary | ICD-10-CM

## 2017-02-21 NOTE — Therapy (Signed)
[place], [place], [address] Phone: [phone]   Fax:  [phone]  Physical Therapy Treatment  Patient Details  Name: DAMONTE FRIESON MRN: [id number] Date of Birth: 1946/01/07 Referring Provider: Kristeen Miss, MD  Encounter Date: 02/21/2017      PT End of Session - 02/21/17 0857    Visit Number 4   Number of Visits 13   Date for PT Re-Evaluation 03/05/17   Authorization Type [employer]   Authorization Time Period 02/12/17 to 03/26/17   Authorization - Visit Number 4   Authorization - Number of Visits 10   PT Start Time 0818   PT Stop Time 2671   PT Time Calculation (min) 39 min   Activity Tolerance Patient tolerated treatment well;No increased pain   Behavior During Therapy WFL for tasks assessed/performed      Past Medical History:  Diagnosis Date  . Coronary artery disease    5%blockage on left anterior descending Dr. Sallyanne Kuster   . Diverticulitis    hx of  . Enlarged prostate    takes flomax  . GERD (gastroesophageal reflux disease)   . History of blood clots    november, 5 1995  . Hyperlipidemia    primary Dr. Selena Batten  . Kidney stone    hx of sees Dr. Chauncey Cruel. [place] [facility]  . Seasonal allergies   . Urinary frequency    hx of     Past Surgical History:  Procedure Laterality Date  . ANTERIOR CERVICAL DECOMP/DISCECTOMY FUSION  01/28/2012   Procedure: ANTERIOR CERVICAL DECOMPRESSION/DISCECTOMY FUSION 2 LEVELS;  Surgeon: Kristeen Miss, MD;  Location: [facility];  Service: Neurosurgery;  Laterality: N/A;  Cervical six-seven, cervical seven-thoracic one Anterior cervical decompression/diskectomy/fusion  . CARDIAC CATHETERIZATION     09-06-11   . CARDIOVASCULAR STRESS TEST     2006 and 2008 at [facility] heart and vascular, 2011 myoview and echo  . COLONOSCOPY    . COLONOSCOPY N/A 08/30/2015   Procedure: COLONOSCOPY;  Surgeon: Aviva Signs, MD;  [facility];  Service:  Gastroenterology;  Laterality: N/A;  . HEMORRHOID SURGERY     sept 8, 2008  . KIDNEY STONE SURGERY     lithotripsy 1980's  . KNEE SURGERY     left 04-11-94 ( has had two surgeries to left knee)  . NASAL SINUS SURGERY     Jun 30 1998  . NM MYOVIEW LTD  07/20/2009   no ischemia  . PILONIDAL CYST / SINUS EXCISION     surgery 01-08-74  . SHOULDER SURGERY     right 12-20-2003  . TRIGGER FINGER RELEASE     03-21-98 right hand ring finger  . US ECHOCARDIOGRAPHY  07/20/2009   LA & RA mildly dilated,trace MR,TR.    There were no vitals filed for this visit.      Subjective Assessment - 02/21/17 0819    Subjective Patient arrives stating he is doing well, his numbness is getting better and is really not that bad this morning. Unless he pays attention to it specifically, he usually does not notice it much. He has been compliant with HEP.    Patient Stated Goals get strength back and ease numbness   Currently in Pain? No/denies                         [facility] Adult PT Treatment/Exercise - 02/21/17 0001      Neck Exercises: Theraband  Scapula Retraction 20 reps;Green   Scapula Retraction Limitations 3 second holds    Shoulder Extension 15 reps;Green   Rows 15 reps;Green     Neck Exercises: Standing   Wall Push Ups 15 reps   Wall Push Ups Limitations 2 sets   with chin tuck    Other Standing Exercises 2x10 shoulder taps chin tuck      Neck Exercises: Seated   Other Seated Exercise medial nerve glides 15x5 seconds    Other Seated Exercise chin tucks with B rotation 1x20      Neck Exercises: Prone   Other Prone Exercise prone I, T, Y, W 1x10     Manual Therapy   Manual Therapy Manual Traction;Joint mobilization   Manual therapy comments completed separate rest of treatment   Joint Mobilization grade III PAs to T4-C3   Manual Traction 5x15 second holds                 PT Education - 02/21/17 0857    Education provided No          PT Short Term Goals  - 02/12/17 1713      PT SHORT TERM GOAL #1   Title Pt will be independent with HEP and perform consistently in order to maximize his overall function.   Time 3   Period Weeks   Status New   Target Date 03/05/17     PT SHORT TERM GOAL #2   Title Pt will have improved strength by at least 5# in his gross grasp and pincher grip strength in order to allow him to clip his fingernails with greater ease.   Time 3   Period Weeks   Status New           PT Long Term Goals - 02/12/17 1715      PT LONG TERM GOAL #1   Title Pt will have improved strength by at least 10# in his gross grasp and pincher grip strength to maximize his ability to play golf.   Time 6   Period Weeks   Status New   Target Date 03/26/17     PT LONG TERM GOAL #2   Title Pt will report decreased numbness or tinging in his L thumb to demonstrate improved overall function and maximize ADLs at home.   Time 6   Period Weeks   Status New     PT LONG TERM GOAL #3   Title Pt will have negative nerve glide testing for radial and median nerves to demonstrate improved overall function.    Time 6   Period Weeks   Status New               Plan - 02/21/17 0857    Clinical Impression Statement Began session with central PAs to high thoracic and majority of cervical spine today as well as introducing manual traction per POC with no significant effect noted on symptoms during/after traction; joint mobilizations did seem to mildly increase tingling in medial nerve pattern today. Otherwise continued with nerve glides as well as ongoing continuation of postural training and progression thereof as able today.    Rehab Potential Good   PT Frequency 2x / week   PT Duration 6 weeks   PT Treatment/Interventions ADLs/Self Care Home Management;Cryotherapy;Electrical Stimulation;Moist Heat;Traction;Therapeutic activities;Therapeutic exercise;Neuromuscular re-education;Patient/family education;Manual techniques;Passive range of  motion;Dry needling;Taping   PT Next Visit Plan nerve flossing with head movements; continue to address and progress grip strength, posture education and strengthening. Perform  standing postural strengthening, continue joint mobs, trial traction within next few sessions; update HEP   PT Home Exercise Plan eval: self median nerve glide, supine cervical retractions, pink putty   Consulted and Agree with Plan of Care Patient      Patient will benefit from skilled therapeutic intervention in order to improve the following deficits and impairments:  Decreased strength, Hypomobility, Decreased range of motion, Increased muscle spasms, Impaired sensation, Impaired UE functional use, Postural dysfunction  Visit Diagnosis: Radiculopathy, cervical region  Muscle weakness (generalized)  Abnormal posture     Problem List Patient Active Problem List   Diagnosis Date Noted  . Mild obesity 09/20/2016  . Edema of both legs 01/17/2013  . Long term current use of anticoagulant therapy 12/30/2012  . History of deep vein thrombosis (DVT) of lower extremity 12/26/2012  . Dyslipidemia 12/26/2012  . Coronary atherosclerosis without stenosis 12/26/2012    Deniece Ree PT, DPT [facility] [address] [place], [place], [address] Phone: [phone]   Fax:  [phone]  Name: VIKRANT PRYCE MRN: [id number] Date of Birth: [birth year]

## 2017-02-27 ENCOUNTER — Ambulatory Visit (HOSPITAL_COMMUNITY): Payer: PPO | Admitting: Physical Therapy

## 2017-02-27 DIAGNOSIS — M6281 Muscle weakness (generalized): Secondary | ICD-10-CM

## 2017-02-27 DIAGNOSIS — R293 Abnormal posture: Secondary | ICD-10-CM

## 2017-02-27 DIAGNOSIS — M5412 Radiculopathy, cervical region: Secondary | ICD-10-CM

## 2017-02-27 NOTE — Therapy (Signed)
[facility] [place], [place], [address] Phone: [phone]   Fax:  [phone]  Physical Therapy Treatment  Patient Details  Name: Casey Castillo MRN: [id number] Date of Birth: 01/20/1946 Referring Provider: Kristeen Miss, MD  Encounter Date: 02/27/2017      PT End of Session - 02/27/17 0936    Visit Number 55   Number of Visits 13   Date for PT Re-Evaluation 03/05/17   Authorization Type [employer]   Authorization Time Period 02/12/17 to 03/26/17   Authorization - Visit Number 5   Authorization - Number of Visits 10   PT Start Time 0818   PT Stop Time 0900   PT Time Calculation (min) 42 min   Activity Tolerance Patient tolerated treatment well;No increased pain   Behavior During Therapy WFL for tasks assessed/performed      Past Medical History:  Diagnosis Date  . Coronary artery disease    5%blockage on left anterior descending Dr. Sallyanne Kuster   . Diverticulitis    hx of  . Enlarged prostate    takes flomax  . GERD (gastroesophageal reflux disease)   . History of blood clots    november, 5 1995  . Hyperlipidemia    primary Dr. Selena Batten  . Kidney stone    hx of sees Dr. Chauncey Cruel. [place]  . Seasonal allergies   . Urinary frequency    hx of     Past Surgical History:  Procedure Laterality Date  . ANTERIOR CERVICAL DECOMP/DISCECTOMY FUSION  01/28/2012   Procedure: ANTERIOR CERVICAL DECOMPRESSION/DISCECTOMY FUSION 2 LEVELS;  Surgeon: Kristeen Miss, MD;  Location: [place] [facility];  Service: Neurosurgery;  Laterality: N/A;  Cervical six-seven, cervical seven-thoracic one Anterior cervical decompression/diskectomy/fusion  . CARDIAC CATHETERIZATION     09-06-11   . CARDIOVASCULAR STRESS TEST     2006 and 2008 at [facility] heart and vascular, 2011 myoview and echo  . COLONOSCOPY    . COLONOSCOPY N/A 08/30/2015   Procedure: COLONOSCOPY;  Surgeon: Aviva Signs, MD;  [facility];  Service:  Gastroenterology;  Laterality: N/A;  . HEMORRHOID SURGERY     sept 8, 2008  . KIDNEY STONE SURGERY     lithotripsy 1980's  . KNEE SURGERY     left 04-11-94 ( has had two surgeries to left knee)  . NASAL SINUS SURGERY     Jun 30 1998  . NM MYOVIEW LTD  07/20/2009   no ischemia  . PILONIDAL CYST / SINUS EXCISION     surgery 01-08-74  . SHOULDER SURGERY     right 12-20-2003  . TRIGGER FINGER RELEASE     03-21-98 right hand ring finger  . US ECHOCARDIOGRAPHY  07/20/2009   LA & RA mildly dilated,trace MR,TR.    There were no vitals filed for this visit.      Subjective Assessment - 02/27/17 0823    Subjective Pt states he is not having any pain.  Reports the tingling he has now is less intense and overall less frequent than it was.     Currently in Pain? No/denies            [facility] PT Assessment - 02/27/17 0001      Assessment   Medical Diagnosis cervical spondylosis with radiculopathy     Strength   Overall Strength Comments pincher grip: 25# (was 23#) on R, 13# (was 11#) on Lt   Right Hand Gross Grasp Functional   Right Hand Grip (  lbs) 81  was 78#   Left Hand Gross Grasp Impaired   Left Hand Grip (lbs) 72  was 62#                     [facility] Adult PT Treatment/Exercise - 02/27/17 0001      Neck Exercises: Theraband   Scapula Retraction 20 reps;Green   Shoulder Extension 20 reps;Green   Rows 20 reps;Green     Neck Exercises: Standing   Wall Push Ups 15 reps   Wall Push Ups Limitations 2 sets    Other Standing Exercises corner stretch 3X20"     Neck Exercises: Seated   Other Seated Exercise medial nerve glides 15x5 seconds    Other Seated Exercise chin tucks with B rotation 1x10 to each side      Neck Exercises: Supine   Neck Retraction 20 reps;5 secs     Neck Exercises: Prone   Other Prone Exercise prone I, T, Y, W 1x15, (i's done singularly)     Hand Exercises for Cervical Radiculopathy   Other Hand Exercise for Cervical Radiculopathy grip  strength (dynamometer) Rt:81# Lt:72# , pinch thumb and 1st Rt: 25#,lt: 13#     Manual Therapy   Manual Therapy Soft tissue mobilization   Manual therapy comments completed seperately from all other skilled interventions   Soft tissue mobilization Lt scapular mm to decrease spasm in seated position                  PT Short Term Goals - 02/12/17 1713      PT SHORT TERM GOAL #1   Title Pt will be independent with HEP and perform consistently in order to maximize his overall function.   Time 3   Period Weeks   Status New   Target Date 03/05/17     PT SHORT TERM GOAL #2   Title Pt will have improved strength by at least 5# in his gross grasp and pincher grip strength in order to allow him to clip his fingernails with greater ease.   Time 3   Period Weeks   Status New           PT Long Term Goals - 02/12/17 1715      PT LONG TERM GOAL #1   Title Pt will have improved strength by at least 10# in his gross grasp and pincher grip strength to maximize his ability to play golf.   Time 6   Period Weeks   Status New   Target Date 03/26/17     PT LONG TERM GOAL #2   Title Pt will report decreased numbness or tinging in his L thumb to demonstrate improved overall function and maximize ADLs at home.   Time 6   Period Weeks   Status New     PT LONG TERM GOAL #3   Title Pt will have negative nerve glide testing for radial and median nerves to demonstrate improved overall function.    Time 6   Period Weeks   Status New               Plan - 02/27/17 2202    Clinical Impression Statement Pt overall improving with decreasing intensity and frequency of neural symptoms.   Able to complete all therex with cues to complete more slowly and controlled.  Added corner stretch for chest mm and progressed reps in prone.  Re-tested grip and pinch strength with noted improvement as well.  Soft tissue  work to [employer] revealed large spasm resolved with manual.     Rehab Potential  Good   PT Frequency 2x / week   PT Duration 6 weeks   PT Treatment/Interventions ADLs/Self Care Home Management;Cryotherapy;Electrical Stimulation;Moist Heat;Traction;Therapeutic activities;Therapeutic exercise;Neuromuscular re-education;Patient/family education;Manual techniques;Passive range of motion;Dry needling;Taping   PT Next Visit Plan nerve flossing with head movements; continue to address and progress grip strength, posture education and strengthening.     PT Home Exercise Plan eval: self median nerve glide, supine cervical retractions, pink putty   Consulted and Agree with Plan of Care Patient      Patient will benefit from skilled therapeutic intervention in order to improve the following deficits and impairments:  Decreased strength, Hypomobility, Decreased range of motion, Increased muscle spasms, Impaired sensation, Impaired UE functional use, Postural dysfunction  Visit Diagnosis: Radiculopathy, cervical region  Muscle weakness (generalized)  Abnormal posture     Problem List Patient Active Problem List   Diagnosis Date Noted  . Mild obesity 09/20/2016  . Edema of both legs 01/17/2013  . Long term current use of anticoagulant therapy 12/30/2012  . History of deep vein thrombosis (DVT) of lower extremity 12/26/2012  . Dyslipidemia 12/26/2012  . Coronary atherosclerosis without stenosis 12/26/2012   Teena Irani, PTA/CLT [phone]  Teena Irani 02/27/2017, 9:43 AM  [facility] [address] [place], [place], [address] Phone: [phone]   Fax:  [phone]  Name: Casey Castillo MRN: [id number] Date of Birth: 07/04/1945

## 2017-03-01 ENCOUNTER — Encounter (HOSPITAL_COMMUNITY): Payer: Self-pay

## 2017-03-01 ENCOUNTER — Ambulatory Visit (HOSPITAL_COMMUNITY): Payer: PPO

## 2017-03-01 DIAGNOSIS — R293 Abnormal posture: Secondary | ICD-10-CM

## 2017-03-01 DIAGNOSIS — M5412 Radiculopathy, cervical region: Secondary | ICD-10-CM

## 2017-03-01 DIAGNOSIS — M6281 Muscle weakness (generalized): Secondary | ICD-10-CM | POA: Diagnosis not present

## 2017-03-01 NOTE — Patient Instructions (Signed)
  ELASTIC BAND SCAPULAR RETRACTIONS WITH MINI SHOULDER EXTENSIONS  While holding an elastic band with both arms in front of you with your elbows straight, squeeze your shoulder blades together as you pull the band back. Be sure your shoulders do not raise up.    Use blue band.  Perform 1x/day, 2-3 sets of 10-15 reps   Scapular Retraction  Start: Position your arm at 90 degrees by your side with Theraband in hand as pictured.  Movement: Against the resistance of the band, squeeze your shoulder blades together as you stick your chest out. Slow and controlled movement. return to start position.  *Note-you should not be pulling with your arms, this will only round your shoulders. The movement we want her is initiated by your shoulder blades, your arms are just holding the resistance.  Perform 1x/day, 2-3 sets of 10-15 reps   WALL PUSH UPS  Standing at a wall, place your arms out in front of you with your elbows Sable Feil so that your hands just reach the wall.  Next, bend your elbows slowly to bring your chest closer to the wall. Maintain your feet planted on the ground the entire time.  Perform 1x/day, 2-3 sets of 10-20 reps

## 2017-03-01 NOTE — Therapy (Signed)
[place], [place], [address] Phone: [phone]   Fax:  [phone]  Physical Therapy Treatment  Patient Details  Name: Casey Castillo MRN: [id number] Date of Birth: 10/19/1945 Referring Provider: Kristeen Miss, MD  Encounter Date: 03/01/2017      PT End of Session - 03/01/17 1349    Visit Number 6   Number of Visits 13   Date for PT Re-Evaluation 03/05/17   Authorization Type [employer]   Authorization Time Period 02/12/17 to 03/26/17   Authorization - Visit Number 6   Authorization - Number of Visits 10   PT Start Time 1350   PT Stop Time 1430   PT Time Calculation (min) 40 min   Activity Tolerance Patient tolerated treatment well;No increased pain   Behavior During Therapy WFL for tasks assessed/performed      Past Medical History:  Diagnosis Date  . Coronary artery disease    5%blockage on left anterior descending Dr. Sallyanne Kuster   . Diverticulitis    hx of  . Enlarged prostate    takes flomax  . GERD (gastroesophageal reflux disease)   . History of blood clots    november, 5 1995  . Hyperlipidemia    primary Dr. Selena Batten  . Kidney stone    hx of sees Dr. Chauncey Cruel. [place]  . Seasonal allergies   . Urinary frequency    hx of     Past Surgical History:  Procedure Laterality Date  . ANTERIOR CERVICAL DECOMP/DISCECTOMY FUSION  01/28/2012   Procedure: ANTERIOR CERVICAL DECOMPRESSION/DISCECTOMY FUSION 2 LEVELS;  Surgeon: Kristeen Miss, MD;  Location: [place] [facility];  Service: Neurosurgery;  Laterality: N/A;  Cervical six-seven, cervical seven-thoracic one Anterior cervical decompression/diskectomy/fusion  . CARDIAC CATHETERIZATION     09-06-11   . CARDIOVASCULAR STRESS TEST     2006 and 2008 at [facility] heart and vascular, 2011 myoview and echo  . COLONOSCOPY    . COLONOSCOPY N/A 08/30/2015   Procedure: COLONOSCOPY;  Surgeon: Aviva Signs, MD;  [facility];  Service:  Gastroenterology;  Laterality: N/A;  . HEMORRHOID SURGERY     sept 8, 2008  . KIDNEY STONE SURGERY     lithotripsy 1980's  . KNEE SURGERY     left 04-11-94 ( has had two surgeries to left knee)  . NASAL SINUS SURGERY     Jun 30 1998  . NM MYOVIEW LTD  07/20/2009   no ischemia  . PILONIDAL CYST / SINUS EXCISION     surgery 01-08-74  . SHOULDER SURGERY     right 12-20-2003  . TRIGGER FINGER RELEASE     03-21-98 right hand ring finger  . US ECHOCARDIOGRAPHY  07/20/2009   LA & RA mildly dilated,trace MR,TR.    There were no vitals filed for this visit.      Subjective Assessment - 03/01/17 1350    Subjective Pt states that he just finished played a round of golf. His hand did not bother him while he was playing but it is a little flared up right now.    Currently in Pain? No/denies                [facility] Adult PT Treatment/Exercise - 03/01/17 0001      Neck Exercises: Standing   UE D2 Limitations 2x10 with RTB, BUE   Other Standing Exercises scap retraction and low rows with BTB x15 reps each   Other Standing Exercises body blade  with elbow bent 5x10" vert and horizontal each, LUE only      Neck Exercises: Seated   Other Seated Exercise retrieve small beads from red putty; yellow handled gripper x 2x20 reps; pincher grip strengthening with black clips x5RT     Neck Exercises: Prone   Other Prone Exercise [place] 6"s with 2# wt, x10 each     Neck Exercises: Stretches   Corner Stretch 3 reps;30 seconds   Other Neck Stretches doorway stretch 3x30               PT Education - 03/01/17 1353    Education provided Yes   Education Details updated HEP   Person(s) Educated Patient   Methods Explanation;Demonstration   Comprehension Verbalized understanding;Returned demonstration          PT Short Term Goals - 02/12/17 1713      PT SHORT TERM GOAL #1   Title Pt will be independent with HEP and perform consistently in order to maximize his overall function.    Time 3   Period Weeks   Status New   Target Date 03/05/17     PT SHORT TERM GOAL #2   Title Pt will have improved strength by at least 5# in his gross grasp and pincher grip strength in order to allow him to clip his fingernails with greater ease.   Time 3   Period Weeks   Status New           PT Long Term Goals - 02/12/17 1715      PT LONG TERM GOAL #1   Title Pt will have improved strength by at least 10# in his gross grasp and pincher grip strength to maximize his ability to play golf.   Time 6   Period Weeks   Status New   Target Date 03/26/17     PT LONG TERM GOAL #2   Title Pt will report decreased numbness or tinging in his L thumb to demonstrate improved overall function and maximize ADLs at home.   Time 6   Period Weeks   Status New     PT LONG TERM GOAL #3   Title Pt will have negative nerve glide testing for radial and median nerves to demonstrate improved overall function.    Time 6   Period Weeks   Status New               Plan - 03/01/17 1441    Clinical Impression Statement pt continues to verbalize decreasing symptoms and overall general improvement. Session focused on improving strength and stability of LUE. Introduced pt to gross grip and pincher grip activities this date and he had difficulty due to weakness. Will continue to perform these in future sessions. Updated HEP. Pt due for reassessment next visit.   Rehab Potential Good   PT Frequency 2x / week   PT Duration 6 weeks   PT Treatment/Interventions ADLs/Self Care Home Management;Cryotherapy;Electrical Stimulation;Moist Heat;Traction;Therapeutic activities;Therapeutic exercise;Neuromuscular re-education;Patient/family education;Manual techniques;Passive range of motion;Dry needling;Taping   PT Next Visit Plan reassess next visit; nerve flossing with head movements; continue to address and progress grip strength, posture education and strengthening.     PT Home Exercise Plan eval: self  median nerve glide, supine cervical retractions, pink putty; 9/21: scap retraction and low rows with BTB, wall push-ups   Consulted and Agree with Plan of Care Patient      Patient will benefit from skilled therapeutic intervention in order to improve the following deficits  and impairments:  Decreased strength, Hypomobility, Decreased range of motion, Increased muscle spasms, Impaired sensation, Impaired UE functional use, Postural dysfunction  Visit Diagnosis: Radiculopathy, cervical region  Muscle weakness (generalized)  Abnormal posture     Problem List Patient Active Problem List   Diagnosis Date Noted  . Mild obesity 09/20/2016  . Edema of both legs 01/17/2013  . Long term current use of anticoagulant therapy 12/30/2012  . History of deep vein thrombosis (DVT) of lower extremity 12/26/2012  . Dyslipidemia 12/26/2012  . Coronary atherosclerosis without stenosis 12/26/2012      Geraldine Solar PT, DPT  [facility] [address] [place], [place], [address] Phone: [phone]   Fax:  [phone]  Name: PRESTEN JOOST MRN: [id number] Date of Birth: 12-03-1945

## 2017-03-04 ENCOUNTER — Encounter (HOSPITAL_COMMUNITY): Payer: Self-pay

## 2017-03-04 ENCOUNTER — Ambulatory Visit (HOSPITAL_COMMUNITY): Payer: PPO

## 2017-03-04 DIAGNOSIS — M5412 Radiculopathy, cervical region: Secondary | ICD-10-CM

## 2017-03-04 DIAGNOSIS — M6281 Muscle weakness (generalized): Secondary | ICD-10-CM

## 2017-03-04 DIAGNOSIS — R293 Abnormal posture: Secondary | ICD-10-CM

## 2017-03-04 NOTE — Therapy (Signed)
[facility], [place], [address] Phone: [phone]   Fax:  [phone]  Physical Therapy Treatment/Reassessment  Patient Details  Name: Casey Castillo MRN: [id number] Date of Birth: Mar 24, 1946 Referring Provider: Kristeen Miss, MD  Encounter Date: 03/04/2017      PT End of Session - 03/04/17 1034    Visit Number 7   Number of Visits 13   Date for PT Re-Evaluation 02-Apr-2017   Authorization Type [employer]   Authorization Time Period 2017/02/19 to Apr 02, 2017 (g-codes done on 06/24/22 visit)   Authorization - Visit Number 7   Authorization - Number of Visits 10   PT Start Time 1033   PT Stop Time 1112   PT Time Calculation (min) 39 min   Activity Tolerance Patient tolerated treatment well;No increased pain   Behavior During Therapy WFL for tasks assessed/performed      Past Medical History:  Diagnosis Date  . Coronary artery disease    5%blockage on left anterior descending Dr. Sallyanne Kuster   . Diverticulitis    hx of  . Enlarged prostate    takes flomax  . GERD (gastroesophageal reflux disease)   . History of blood clots    november, 5 1995  . Hyperlipidemia    primary Dr. Selena Batten  . Kidney stone    hx of sees Dr. Chauncey Cruel. [place]  . Seasonal allergies   . Urinary frequency    hx of     Past Surgical History:  Procedure Laterality Date  . ANTERIOR CERVICAL DECOMP/DISCECTOMY FUSION  01/28/2012   Procedure: ANTERIOR CERVICAL DECOMPRESSION/DISCECTOMY FUSION 2 LEVELS;  Surgeon: Kristeen Miss, MD;  Location: [place] [facility];  Service: Neurosurgery;  Laterality: N/A;  Cervical six-seven, cervical seven-thoracic one Anterior cervical decompression/diskectomy/fusion  . CARDIAC CATHETERIZATION     09-06-11   . CARDIOVASCULAR STRESS TEST     2006 and 2008 at [facility] heart and vascular, 2011 myoview and echo  . COLONOSCOPY    . COLONOSCOPY N/A 08/30/2015   Procedure: COLONOSCOPY;  Surgeon: Aviva Signs, MD;   [facility];  Service: Gastroenterology;  Laterality: N/A;  . HEMORRHOID SURGERY     sept 8, 2008  . KIDNEY STONE SURGERY     lithotripsy 1980's  . KNEE SURGERY     left 04-11-94 ( has had two surgeries to left knee)  . NASAL SINUS SURGERY     Jun 30 1998  . NM MYOVIEW LTD  07/20/2009   no ischemia  . PILONIDAL CYST / SINUS EXCISION     surgery 01-08-74  . SHOULDER SURGERY     right 12-20-2003  . TRIGGER FINGER RELEASE     03-21-98 right hand ring finger  . US ECHOCARDIOGRAPHY  07/20/2009   LA & RA mildly dilated,trace MR,TR.    There were no vitals filed for this visit.      Subjective Assessment - 03/04/17 1035    Subjective Pt states that he has some numbness this morning. He did well over the weekend, no issues with his new HEP   Currently in Pain? No/denies            [facility] PT Assessment - 03/04/17 0001      Strength   Overall Strength Comments pincher grip: R: 23# (was 23#); L: 11# (was 11#)   Right Hand Gross Grasp Functional   Right Hand Grip (lbs) 78  was 78   Left Hand Gross Grasp Impaired   Left Hand  Grip (lbs) 72     other    Findings Positive   Side Left   Comment Medial nerve glide: mildly positive as pt already had n/t symptoms prior to assessment and median nerve glide test did not change symptoms     other    Findings Negative   Side Left   Comment Radial nerve glide: (-) no reproductions of symptoms              [facility] Adult PT Treatment/Exercise - 03/04/17 0001      Hand Exercises for Cervical Radiculopathy   Pinch Grip black clips 2x5RT     Manual Therapy   Manual Therapy Joint mobilization;Manual Traction   Manual therapy comments completed seperately from all other skilled interventions   Joint Mobilization Grade III-IV PAs to C5-7, decreased n/t in L hand   Manual Traction 4x15-30 seconds                PT Education - 03/04/17 1108    Education provided Yes   Education Details will continue POC as planned,  continue HEP   Person(s) Educated Patient   Methods Explanation;Demonstration   Comprehension Verbalized understanding;Returned demonstration          PT Short Term Goals - 03/04/17 1036      PT SHORT TERM GOAL #1   Title Pt will be independent with HEP and perform consistently in order to maximize his overall function.   Time 3   Period Weeks   Status Achieved     PT SHORT TERM GOAL #2   Title Pt will have improved strength by at least 5# in his gross grasp and pincher grip strength in order to allow him to clip his fingernails with greater ease.   Baseline 9/24: gross grasp has improved 10#, pincher grip is the same as initial eval   Time 3   Period Weeks   Status Partially Met           PT Long Term Goals - 03/04/17 1037      PT LONG TERM GOAL #1   Title Pt will have improved strength by at least 10# in his gross grasp and pincher grip strength to maximize his ability to play golf.   Baseline 9/24: gross grasp has improved 10#, pincher grip is the same as initial eval   Time 6   Period Weeks   Status Partially Met     PT LONG TERM GOAL #2   Title Pt will report decreased numbness or tinging in his L thumb to demonstrate improved overall function and maximize ADLs at home.   Baseline 9/24: he reports intermittent flare-ups but overall the intensity and frequency of the n/t is decreasing; 40-50% improved in n/t   Time 6   Period Weeks   Status Partially Met     PT LONG TERM GOAL #3   Title Pt will have negative nerve glide testing for radial and median nerves to demonstrate improved overall function.    Baseline 9/24: Radial nerve glide test negative as pt did not have increase in symptoms; median still mildly positive as he had symptoms prior to median nerve test and his symptoms were present throughout test but did not increase   Time 6   Period Weeks   Status Partially Met               Plan - 03/04/17 1113    Clinical Impression Statement PT  reassessed pt's goals and outcome measures  this date. Pt has made good progress towards all goals, partially meeting 4 and meeting 1. His gross grasp has improved by 10# since initial eval but his pincher grip is still limited compared to the R. Overall, he feels that his n/t has improved 40-50% since starting therapy, reporting that sometimes it is completely gone but it will still flare up. Overall, pt is making good progress and the POC will be continued as planned. Cervical traction may be attempted next session to further promote decreased n/t.    Rehab Potential Good   PT Frequency 2x / week   PT Duration 6 weeks   PT Treatment/Interventions ADLs/Self Care Home Management;Cryotherapy;Electrical Stimulation;Moist Heat;Traction;Therapeutic activities;Therapeutic exercise;Neuromuscular re-education;Patient/family education;Manual techniques;Passive range of motion;Dry needling;Taping   PT Next Visit Plan continue to address and progress grip strength, posture education and strengthening.; trial cervical traction   PT Home Exercise Plan eval: self median nerve glide, supine cervical retractions, pink putty; 9/21: scap retraction and low rows with BTB, wall push-ups   Consulted and Agree with Plan of Care Patient      Patient will benefit from skilled therapeutic intervention in order to improve the following deficits and impairments:  Decreased strength, Hypomobility, Decreased range of motion, Increased muscle spasms, Impaired sensation, Impaired UE functional use, Postural dysfunction  Visit Diagnosis: Radiculopathy, cervical region  Muscle weakness (generalized)  Abnormal posture       G-Codes - 27-Mar-2017 1113    Functional Assessment Tool Used (Outpatient Only) clinical judgement, grip strength, nerve glide testing   Functional Limitation Carrying, moving and handling objects   Carrying, Moving and Handling Objects Current Status (E2683) At least 20 percent but less than 40 percent  impaired, limited or restricted   Carrying, Moving and Handling Objects Goal Status (M1962) At least 1 percent but less than 20 percent impaired, limited or restricted      Problem List Patient Active Problem List   Diagnosis Date Noted  . Mild obesity 09/20/2016  . Edema of both legs 01/17/2013  . Long term current use of anticoagulant therapy 12/30/2012  . History of deep vein thrombosis (DVT) of lower extremity 12/26/2012  . Dyslipidemia 12/26/2012  . Coronary atherosclerosis without stenosis 12/26/2012       Geraldine Solar PT, DPT  [facility] [address] [place], [place], [address] Phone: [phone]   Fax:  [phone]  Name: ROCKNE DEARINGER MRN: [id number] Date of Birth: [birth year]

## 2017-03-07 ENCOUNTER — Ambulatory Visit (HOSPITAL_COMMUNITY): Payer: PPO | Admitting: Physical Therapy

## 2017-03-07 DIAGNOSIS — R293 Abnormal posture: Secondary | ICD-10-CM

## 2017-03-07 DIAGNOSIS — M6281 Muscle weakness (generalized): Secondary | ICD-10-CM

## 2017-03-07 DIAGNOSIS — M5412 Radiculopathy, cervical region: Secondary | ICD-10-CM

## 2017-03-07 NOTE — Therapy (Signed)
[facility] [address] [place], [place], [address] Phone: [phone]   Fax:  [phone]  Physical Therapy Treatment  Patient Details  Name: Casey Castillo MRN: [id number] Date of Birth: 05/15/1946 Referring Provider: Henry Elsner, MD  Encounter Date: 03/07/2017      PT End of Session - 03/07/17 1112    Visit Number 8   Number of Visits 13   Date for PT Re-Evaluation 03/26/17   Authorization Type [employer]   Authorization Time Period 02/12/17 to 03/26/17 (g-codes done on 7th visit)   Authorization - Visit Number 8   Authorization - Number of Visits 10   PT Start Time 1023   PT Stop Time 1110   PT Time Calculation (min) 47 min   Activity Tolerance Patient tolerated treatment well;No increased pain   Behavior During Therapy WFL for tasks assessed/performed      Past Medical History:  Diagnosis Date  . Coronary artery disease    5%blockage on left anterior descending Dr. Croitoru   . Diverticulitis    hx of  . Enlarged prostate    takes flomax  . GERD (gastroesophageal reflux disease)   . History of blood clots    november, 5 1995  . Hyperlipidemia    primary Dr. L.  Fusco  . Kidney stone    hx of sees Dr. s. Krishnan  . Seasonal allergies   . Urinary frequency    hx of     Past Surgical History:  Procedure Laterality Date  . ANTERIOR CERVICAL DECOMP/DISCECTOMY FUSION  01/28/2012   Procedure: ANTERIOR CERVICAL DECOMPRESSION/DISCECTOMY FUSION 2 LEVELS;  Surgeon: Henry Elsner, MD;  Location: [facility];  Service: Neurosurgery;  Laterality: N/A;  Cervical six-seven, cervical seven-thoracic one Anterior cervical decompression/diskectomy/fusion  . CARDIAC CATHETERIZATION     09-06-11   . CARDIOVASCULAR STRESS TEST     2006 and 2008 at [facility], 2011 myoview and echo  . COLONOSCOPY    . COLONOSCOPY N/A 08/30/2015   Procedure: COLONOSCOPY;  Surgeon: Mark Jenkins, MD;  Location: [facility];  Service: Gastroenterology;  Laterality: N/A;  . HEMORRHOID SURGERY     sept 8, 2008  . KIDNEY STONE SURGERY     lithotripsy 1980's  . KNEE SURGERY     left 04-11-94 ( has had two surgeries to left knee)  . NASAL SINUS SURGERY     Jun 30 1998  . NM MYOVIEW LTD  07/20/2009   no ischemia  . PILONIDAL CYST / SINUS EXCISION     surgery 01-08-74  . SHOULDER SURGERY     right 12-20-2003  . TRIGGER FINGER RELEASE     03-21-98 right hand ring finger  . US ECHOCARDIOGRAPHY  07/20/2009   LA & RA mildly dilated,trace MR,TR.    There were no vitals filed for this visit.      Subjective Assessment - 03/07/17 1110    Subjective Pt states that he does not have pain in his neck he just has tingling from his left elbow to his thumb.  States that he did something to his Rt rib area about two days ago and is having pain here.  Pain is constant but varies from a 2 to an 8    Currently in Pain? Yes   Pain Score 2    Pain Location Rib cage   Pain Orientation Right   Pain Descriptors / Indicators Aching;Spasm   Pain Type Acute pain     Pain Onset In the past 7 days   Pain Frequency Constant   Aggravating Factors  certain motions    Pain Relieving Factors unknown has tried heat, ice and pain patches                          [facility] Adult PT Treatment/Exercise - 03/07/17 0001      Neck Exercises: Theraband   Other Theraband Exercises punch down with blue t-band x 10 each     Neck Exercises: Standing   UE D2 Limitations 10 Blue t-band    Other Standing Exercises stabilize neck with UE flexion at walll; corner stretch x 10   Other Standing Exercises median nerve stretch, 3 way pectorial stretch      Neck Exercises: Prone   Axial Exentsion 10 reps   Shoulder Extension 10 reps   Rows Limitations 10 with 2 #    Other Prone Exercise horizontal abduction x 10      Manual Therapy   Manual Therapy Joint mobilization;Manual Traction   Manual therapy comments completed seperately  from all other skilled interventions   Joint Mobilization Grade III-IV PAs to C5-7, decreased n/t in L hand   Manual Traction 4x15-30 seconds                  PT Short Term Goals - 03/04/17 1036      PT SHORT TERM GOAL #1   Title Pt will be independent with HEP and perform consistently in order to maximize his overall function.   Time 3   Period Weeks   Status Achieved     PT SHORT TERM GOAL #2   Title Pt will have improved strength by at least 5# in his gross grasp and pincher grip strength in order to allow him to clip his fingernails with greater ease.   Baseline 9/24: gross grasp has improved 10#, pincher grip is the same as initial eval   Time 3   Period Weeks   Status Partially Met           PT Long Term Goals - 03/04/17 1037      PT LONG TERM GOAL #1   Title Pt will have improved strength by at least 10# in his gross grasp and pincher grip strength to maximize his ability to play golf.   Baseline 9/24: gross grasp has improved 10#, pincher grip is the same as initial eval   Time 6   Period Weeks   Status Partially Met     PT LONG TERM GOAL #2   Title Pt will report decreased numbness or tinging in his L thumb to demonstrate improved overall function and maximize ADLs at home.   Baseline 9/24: he reports intermittent flare-ups but overall the intensity and frequency of the n/t is decreasing; 40-50% improved in n/t   Time 6   Period Weeks   Status Partially Met     PT LONG TERM GOAL #3   Title Pt will have negative nerve glide testing for radial and median nerves to demonstrate improved overall function.    Baseline 9/24: Radial nerve glide test negative as pt did not have increase in symptoms; median still mildly positive as he had symptoms prior to median nerve test and his symptoms were present throughout test but did not increase   Time 6   Period Weeks   Status Partially Met                 Plan - 03/07/17 1112    Clinical Impression  Statement Pt treatment limited due to pain that he is feeling in his right rib cage area.  Added pectoral and standing median nerve stretch to pt program.  Given HEP.  Continued with manual traction due to history of cervical surgery.     Rehab Potential Good   PT Frequency 2x / week   PT Duration 6 weeks   PT Treatment/Interventions ADLs/Self Care Home Management;Cryotherapy;Electrical Stimulation;Moist Heat;Traction;Therapeutic activities;Therapeutic exercise;Neuromuscular re-education;Patient/family education;Manual techniques;Passive range of motion;Dry needling;Taping   PT Next Visit Plan continue to address and progress grip strength, posture education and strengthening.;   PT Home Exercise Plan eval: self median nerve glide, supine cervical retractions, pink putty; 9/21: scap retraction and low rows with BTB, wall push-ups; 9/27: pectoral stretches and standing median never stretch.    Consulted and Agree with Plan of Care Patient      Patient will benefit from skilled therapeutic intervention in order to improve the following deficits and impairments:  Decreased strength, Hypomobility, Decreased range of motion, Increased muscle spasms, Impaired sensation, Impaired UE functional use, Postural dysfunction  Visit Diagnosis: Radiculopathy, cervical region  Muscle weakness (generalized)  Abnormal posture     Problem List Patient Active Problem List   Diagnosis Date Noted  . Mild obesity 09/20/2016  . Edema of both legs 01/17/2013  . Long term current use of anticoagulant therapy 12/30/2012  . History of deep vein thrombosis (DVT) of lower extremity 12/26/2012  . Dyslipidemia 12/26/2012  . Coronary atherosclerosis without stenosis 12/26/2012    RUSSELL,CINDY 03/07/2017, 11:16 AM  [facility] [address] [place], [place], [address] Phone: [phone]   Fax:  [phone]  Name: Casey Castillo MRN: [id number] Date of Birth:  08/30/1945   

## 2017-03-07 NOTE — Patient Instructions (Addendum)
Upper Limb Neural Tension: Median II    Stand with left palm flat on wall, fingers up. Bend elbow, side-bend head away and Hold __15__ seconds. Straighten elbow and Hold __15__ seconds. Repeat ___3_ times per set. Do _1___ sets per session. Do ____ sessions per day.  http://orth.exer.us/404   Copyright  VHI. All rights reserved.

## 2017-03-11 ENCOUNTER — Ambulatory Visit (HOSPITAL_COMMUNITY): Payer: PPO | Attending: Neurological Surgery

## 2017-03-11 ENCOUNTER — Encounter (HOSPITAL_COMMUNITY): Payer: Self-pay

## 2017-03-11 DIAGNOSIS — R293 Abnormal posture: Secondary | ICD-10-CM | POA: Insufficient documentation

## 2017-03-11 DIAGNOSIS — M5412 Radiculopathy, cervical region: Secondary | ICD-10-CM | POA: Insufficient documentation

## 2017-03-11 DIAGNOSIS — M6281 Muscle weakness (generalized): Secondary | ICD-10-CM | POA: Diagnosis not present

## 2017-03-11 NOTE — Therapy (Signed)
[facility] [place], [place], [address] Phone: [phone]   Fax:  [phone]  Physical Therapy Treatment  Patient Details  Name: Casey Castillo MRN: [id number] Date of Birth: [birth year] Referring Provider: Kristeen Miss, MD  Encounter Date: 03/11/2017      PT End of Session - 03/11/17 0948    Visit Number 9   Number of Visits 13   Date for PT Re-Evaluation 2017/04/18   Authorization Type [employer]   Authorization Time Period 03/07/17 to 2017/04/18 (g-codes done on Sep 08, 2022 visit)   Authorization - Visit Number 9   Authorization - Number of Visits 13   PT Start Time 0946   PT Stop Time 1025   PT Time Calculation (min) 39 min   Activity Tolerance Patient tolerated treatment well;No increased pain   Behavior During Therapy WFL for tasks assessed/performed      Past Medical History:  Diagnosis Date  . Coronary artery disease    5%blockage on left anterior descending Dr. Sallyanne Kuster   . Diverticulitis    hx of  . Enlarged prostate    takes flomax  . GERD (gastroesophageal reflux disease)   . History of blood clots    november, 5 1995  . Hyperlipidemia    primary Dr. Selena Batten  . Kidney stone    hx of sees Dr. Chauncey Cruel. [place] [facility]  . Seasonal allergies   . Urinary frequency    hx of     Past Surgical History:  Procedure Laterality Date  . ANTERIOR CERVICAL DECOMP/DISCECTOMY FUSION  01/28/2012   Procedure: ANTERIOR CERVICAL DECOMPRESSION/DISCECTOMY FUSION 2 LEVELS;  Surgeon: Kristeen Miss, MD;  Location: [facility];  Service: Neurosurgery;  Laterality: N/A;  Cervical six-seven, cervical seven-thoracic one Anterior cervical decompression/diskectomy/fusion  . CARDIAC CATHETERIZATION     09-06-11   . CARDIOVASCULAR STRESS TEST     2006 and 2008 at [facility], 2011 myoview and echo  . COLONOSCOPY    . COLONOSCOPY N/A 08/30/2015   Procedure: COLONOSCOPY;  Surgeon: Aviva Signs, MD;  Location: [facility];  Service: Gastroenterology;  Laterality: N/A;  . HEMORRHOID SURGERY     sept 8, 2008  . KIDNEY STONE SURGERY     lithotripsy 1980's  . KNEE SURGERY     left 04-11-94 ( has had two surgeries to left knee)  . NASAL SINUS SURGERY     Jun 30 1998  . NM MYOVIEW LTD  07/20/2009   no ischemia  . PILONIDAL CYST / SINUS EXCISION     surgery 01-08-74  . SHOULDER SURGERY     right 12-20-2003  . TRIGGER FINGER RELEASE     03-21-98 right hand ring finger  . US ECHOCARDIOGRAPHY  07/20/2009   LA & RA mildly dilated,trace MR,TR.    There were no vitals filed for this visit.      Subjective Assessment - 03/11/17 0948    Subjective Pt states that his numbness is not that bad this morning. He states that his rib cage is much better.    Currently in Pain? No/denies   Pain Onset In the past 7 days            [facility] PT Assessment - 03/11/17 0001      Strength   Overall Strength Comments pincher grip: R: 23# (was 23#); L: 8# (was 11#)   Right Hand Gross Grasp Functional   Right Hand Grip (lbs) 76   Left Hand Gross  Grasp Functional   Left Hand Grip (lbs) 70               [facility] Adult PT Treatment/Exercise - 03/11/17 0001      Neck Exercises: Machines for Strengthening   UBE (Upper Arm Bike) L3 x4 mins (2 min fwd/retro each)      Neck Exercises: Standing   UE Flexion with Stabilization Limitations high rows 2x10 with RTB   UE D1 Limitations 2x10 with BTB   UE D2 Limitations 2x10 with BTB     Neck Exercises: Prone   Other Prone Exercise [place] 6's with 3# x10 reps (LUE only)     Hand Exercises for Cervical Radiculopathy   Pinch Grip black clips 2x5RT   Other Hand Exercise for Cervical Radiculopathy pincher grip strengthening with velcro board using #7 tool on small strip x5RT     Manual Therapy   Manual Therapy Joint mobilization   Manual therapy comments completed seperately from all other skilled interventions   Joint Mobilization Grade III-IV PAs to C5-7, min to no  change in n/t in L hand              PT Education - 03/11/17 0949    Education provided Yes   Education Details exercise technique, continue with current HEP   Person(s) Educated Patient   Methods Explanation;Demonstration   Comprehension Verbalized understanding;Returned demonstration          PT Short Term Goals - 03/04/17 1036      PT SHORT TERM GOAL #1   Title Pt will be independent with HEP and perform consistently in order to maximize his overall function.   Time 3   Period Weeks   Status Achieved     PT SHORT TERM GOAL #2   Title Pt will have improved strength by at least 5# in his gross grasp and pincher grip strength in order to allow him to clip his fingernails with greater ease.   Baseline 9/24: gross grasp has improved 10#, pincher grip is the same as initial eval   Time 3   Period Weeks   Status Partially Met           PT Long Term Goals - 03/04/17 1037      PT LONG TERM GOAL #1   Title Pt will have improved strength by at least 10# in his gross grasp and pincher grip strength to maximize his ability to play golf.   Baseline 9/24: gross grasp has improved 10#, pincher grip is the same as initial eval   Time 6   Period Weeks   Status Partially Met     PT LONG TERM GOAL #2   Title Pt will report decreased numbness or tinging in his L thumb to demonstrate improved overall function and maximize ADLs at home.   Baseline 9/24: he reports intermittent flare-ups but overall the intensity and frequency of the n/t is decreasing; 40-50% improved in n/t   Time 6   Period Weeks   Status Partially Met     PT LONG TERM GOAL #3   Title Pt will have negative nerve glide testing for radial and median nerves to demonstrate improved overall function.    Baseline 9/24: Radial nerve glide test negative as pt did not have increase in symptoms; median still mildly positive as he had symptoms prior to median nerve test and his symptoms were present throughout test but  did not increase   Time 6   Period Weeks  Status Partially Met               Plan - 03/11/17 1018    Clinical Impression Statement Pt presented to therapy with min tingling in L forearm/hand this date. Session focused on improving scap stab and overall strength of LUE and hand. Manual CPAs did not change pt's n/t much this date. Pt's L grip strength still limited as he was challenged during the Velcro and clip exercises. Pt stated that he is going to see his MD on Thursday 10/4 so PT reassessed pt's grip strength. His gross grasp is functional as he was 76# on R and 70# on L; his pincher grip is still deficient as he was 23# on R and 8# on L (has been 11# on L but it is likely decreased this date due to fatigue from exercises performed prior).    Rehab Potential Good   PT Frequency 2x / week   PT Duration 6 weeks   PT Treatment/Interventions ADLs/Self Care Home Management;Cryotherapy;Electrical Stimulation;Moist Heat;Traction;Therapeutic activities;Therapeutic exercise;Neuromuscular re-education;Patient/family education;Manual techniques;Passive range of motion;Dry needling;Taping   PT Next Visit Plan continue to address and progress grip strength, posture education and strengthening.; continue velcro for pincher grip strength   PT Home Exercise Plan eval: self median nerve glide, supine cervical retractions, pink putty; 9/21: scap retraction and low rows with BTB, wall push-ups; 9/27: pectoral stretches and standing median nerve stretch.    Consulted and Agree with Plan of Care Patient      Patient will benefit from skilled therapeutic intervention in order to improve the following deficits and impairments:  Decreased strength, Hypomobility, Decreased range of motion, Increased muscle spasms, Impaired sensation, Impaired UE functional use, Postural dysfunction  Visit Diagnosis: Radiculopathy, cervical region  Muscle weakness (generalized)  Abnormal posture     Problem  List Patient Active Problem List   Diagnosis Date Noted  . Mild obesity 09/20/2016  . Edema of both legs 01/17/2013  . Long term current use of anticoagulant therapy 12/30/2012  . History of deep vein thrombosis (DVT) of lower extremity 12/26/2012  . Dyslipidemia 12/26/2012  . Coronary atherosclerosis without stenosis 12/26/2012       Geraldine Solar PT, DPT  [facility] [address] [place], [place], [address] Phone: [phone]   Fax:  [phone]  Name: Casey Castillo MRN: [id number] Date of Birth: 1946/01/25

## 2017-03-14 ENCOUNTER — Ambulatory Visit (HOSPITAL_COMMUNITY): Payer: PPO

## 2017-03-14 DIAGNOSIS — M5412 Radiculopathy, cervical region: Secondary | ICD-10-CM | POA: Diagnosis not present

## 2017-03-14 DIAGNOSIS — M4722 Other spondylosis with radiculopathy, cervical region: Secondary | ICD-10-CM | POA: Diagnosis not present

## 2017-03-14 DIAGNOSIS — R293 Abnormal posture: Secondary | ICD-10-CM

## 2017-03-14 DIAGNOSIS — M6281 Muscle weakness (generalized): Secondary | ICD-10-CM

## 2017-03-14 DIAGNOSIS — Z6831 Body mass index (BMI) 31.0-31.9, adult: Secondary | ICD-10-CM | POA: Diagnosis not present

## 2017-03-14 NOTE — Therapy (Signed)
[place], [place], [address] Phone: [phone]   Fax:  [phone]  Physical Therapy Treatment  Patient Details  Name: Casey Castillo MRN: [id number] Date of Birth: Oct 03, 1945 Referring Provider: Kristeen Miss, MD  Encounter Date: 03/14/2017      PT End of Session - 03/14/17 1327    Visit Number 10   Number of Visits 13   Date for PT Re-Evaluation 03-28-17   Authorization Type [employer]   Authorization Time Period 2017-02-14 to 2017-03-28 (g-codes done on 2022/07/20 visit)   Authorization - Visit Number 10   Authorization - Number of Visits 13   PT Start Time 1304   PT Stop Time 1350   PT Time Calculation (min) 46 min   Activity Tolerance Patient tolerated treatment well;No increased pain   Behavior During Therapy WFL for tasks assessed/performed      Past Medical History:  Diagnosis Date  . Coronary artery disease    5%blockage on left anterior descending Dr. Sallyanne Kuster   . Diverticulitis    hx of  . Enlarged prostate    takes flomax  . GERD (gastroesophageal reflux disease)   . History of blood clots    november, 5 1995  . Hyperlipidemia    primary Dr. Selena Batten  . Kidney stone    hx of sees Dr. Chauncey Cruel. [place]  . Seasonal allergies   . Urinary frequency    hx of     Past Surgical History:  Procedure Laterality Date  . ANTERIOR CERVICAL DECOMP/DISCECTOMY FUSION  01/28/2012   Procedure: ANTERIOR CERVICAL DECOMPRESSION/DISCECTOMY FUSION 2 LEVELS;  Surgeon: Kristeen Miss, MD;  Location: [facility];  Service: Neurosurgery;  Laterality: N/A;  Cervical six-seven, cervical seven-thoracic one Anterior cervical decompression/diskectomy/fusion  . CARDIAC CATHETERIZATION     09-06-11   . CARDIOVASCULAR STRESS TEST     2006 and 2008 at [facility] heart and vascular, 2011 myoview and echo  . COLONOSCOPY    . COLONOSCOPY N/A 08/30/2015   Procedure: COLONOSCOPY;  Surgeon: Aviva Signs, MD;  [facility];  Service: Gastroenterology;  Laterality: N/A;  . HEMORRHOID SURGERY     sept 8, 2008  . KIDNEY STONE SURGERY     lithotripsy 1980's  . KNEE SURGERY     left 04-11-94 ( has had two surgeries to left knee)  . NASAL SINUS SURGERY     Jun 30 1998  . NM MYOVIEW LTD  07/20/2009   no ischemia  . PILONIDAL CYST / SINUS EXCISION     surgery 01-08-74  . SHOULDER SURGERY     right 12-20-2003  . TRIGGER FINGER RELEASE     03-21-98 right hand ring finger  . US ECHOCARDIOGRAPHY  07/20/2009   LA & RA mildly dilated,trace MR,TR.    There were no vitals filed for this visit.      Subjective Assessment - 03/14/17 1308    Subjective Pt reports he feels good. Thinks he is making progress, but ket-grip pinch remains weak functionally , especially noticeable when clipping his nails.                          [facility] Adult PT Treatment/Exercise - 03/14/17 0001      Neck Exercises: Machines for Strengthening   UBE (Upper Arm Bike) L3 x4 mins (2 min fwd/retro each)    Other Machines for Strengthening horizontal abduction cables: 2plates 2x8  shoulder extension  cables: 2plates 2x8   Other Machines for Strengthening GHJ external rotation cables: 2plates 2x8  cable row standing: 2x15, 5 plates     Neck Exercises: Standing   UE Flexion with Stabilization Limitations --  *progressed to cables, see above    Other Standing Exercises Scapular T's BlueTB: 2x10      Hand Exercises for Cervical Radiculopathy   Pinch Grip black clips 2x5RT  (unable to get to for time)   Other Hand Exercise for Cervical Radiculopathy pincher grip strengthening with velcro board using #7 tool on small strip x5RT  (unable to get to for time)     Manual Therapy   Manual Therapy Myofascial release;Passive ROM   Myofascial Release Left scalenes group (no effect), Left pec minor (painful), Left common extensor group/brachioradialis/pronator teres   10 minutes    Passive ROM Scapular posterior tilt stretch  3x30sec  Onset of numbeness/paresthesia at brachiradialis-pollux L                   PT Short Term Goals - 03/04/17 1036      PT SHORT TERM GOAL #1   Title Pt will be independent with HEP and perform consistently in order to maximize his overall function.   Time 3   Period Weeks   Status Achieved     PT SHORT TERM GOAL #2   Title Pt will have improved strength by at least 5# in his gross grasp and pincher grip strength in order to allow him to clip his fingernails with greater ease.   Baseline 9/24: gross grasp has improved 10#, pincher grip is the same as initial eval   Time 3   Period Weeks   Status Partially Met           PT Long Term Goals - 03/04/17 1037      PT LONG TERM GOAL #1   Title Pt will have improved strength by at least 10# in his gross grasp and pincher grip strength to maximize his ability to play golf.   Baseline 9/24: gross grasp has improved 10#, pincher grip is the same as initial eval   Time 6   Period Weeks   Status Partially Met     PT LONG TERM GOAL #2   Title Pt will report decreased numbness or tinging in his L thumb to demonstrate improved overall function and maximize ADLs at home.   Baseline 9/24: he reports intermittent flare-ups but overall the intensity and frequency of the n/t is decreasing; 40-50% improved in n/t   Time 6   Period Weeks   Status Partially Met     PT LONG TERM GOAL #3   Title Pt will have negative nerve glide testing for radial and median nerves to demonstrate improved overall function.    Baseline 9/24: Radial nerve glide test negative as pt did not have increase in symptoms; median still mildly positive as he had symptoms prior to median nerve test and his symptoms were present throughout test but did not increase   Time 6   Period Weeks   Status Partially Met               Plan - 03/14/17 1359    Clinical Impression Statement Progressed scapular stabiolization from bands to cables. Excellen  tstabilization of scap throughout, but noted difficulty during Right GHJ external rotation with strong triceps compensation. Left scapular anterior tilt throughout session, addressed with release of pec minor and scap tilt stretch, this is the  only consistent reproduction of LUE paresthesia (>25 seconds) questioning an additional componenet of left sided thoracic outlet involvement on top of other problems. Pt toleraing session well overal, excellent progress toward goals. Unable to get to grip activities thsi date due to time restraints.     Rehab Potential Good   PT Frequency 2x / week   PT Duration 6 weeks   PT Treatment/Interventions ADLs/Self Care Home Management;Cryotherapy;Electrical Stimulation;Moist Heat;Traction;Therapeutic activities;Therapeutic exercise;Neuromuscular re-education;Patient/family education;Manual techniques;Passive range of motion;Dry needling;Taping   PT Next Visit Plan continue to address and progress grip strength, posture education and strengthening.; continue velcro for pincher grip strength; pec minor release and stretch as tolerated.    PT Home Exercise Plan eval: self median nerve glide, supine cervical retractions, pink putty; 9/21: scap retraction and low rows with BTB, wall push-ups; 9/27: pectoral stretches and standing median nerve stretch.    Consulted and Agree with Plan of Care Patient      Patient will benefit from skilled therapeutic intervention in order to improve the following deficits and impairments:  Decreased strength, Hypomobility, Decreased range of motion, Increased muscle spasms, Impaired sensation, Impaired UE functional use, Postural dysfunction  Visit Diagnosis: Radiculopathy, cervical region  Muscle weakness (generalized)  Abnormal posture     Problem List Patient Active Problem List   Diagnosis Date Noted  . Mild obesity 09/20/2016  . Edema of both legs 01/17/2013  . Long term current use of anticoagulant therapy 12/30/2012  .  History of deep vein thrombosis (DVT) of lower extremity 12/26/2012  . Dyslipidemia 12/26/2012  . Coronary atherosclerosis without stenosis 12/26/2012   2:06 PM, 03/14/17 Etta Grandchild, PT, DPT Physical Therapist at [facility] [phone] (office)       Etta Grandchild 03/14/2017, 2:05 PM  [facility] [address] [place], [place], [address] Phone: [phone]   Fax:  [phone]  Name: Casey Castillo MRN: [id number] Date of Birth: [birth year]

## 2017-03-18 ENCOUNTER — Ambulatory Visit (HOSPITAL_COMMUNITY): Payer: PPO

## 2017-03-18 ENCOUNTER — Encounter (HOSPITAL_COMMUNITY): Payer: Self-pay

## 2017-03-18 DIAGNOSIS — M6281 Muscle weakness (generalized): Secondary | ICD-10-CM

## 2017-03-18 DIAGNOSIS — M5412 Radiculopathy, cervical region: Secondary | ICD-10-CM | POA: Diagnosis not present

## 2017-03-18 DIAGNOSIS — R293 Abnormal posture: Secondary | ICD-10-CM

## 2017-03-18 NOTE — Therapy (Signed)
[place], [place], [address] Phone: [phone]   Fax:  [phone]  Physical Therapy Treatment  Patient Details  Name: Casey Castillo MRN: [id number] Date of Birth: [birth year] Referring Provider: Kristeen Miss, MD  Encounter Date: 03/18/2017      PT End of Session - 03/18/17 0947    Visit Number 11   Number of Visits 13   Date for PT Re-Evaluation Apr 10, 2017   Authorization Type [employer]   Authorization Time Period Feb 27, 2017 to 04-10-2017 (g-codes done on 2022/07/02 visit)   Authorization - Visit Number 11   Authorization - Number of Visits 13   PT Start Time 0945   PT Stop Time 1024   PT Time Calculation (min) 39 min   Activity Tolerance Patient tolerated treatment well;No increased pain   Behavior During Therapy WFL for tasks assessed/performed      Past Medical History:  Diagnosis Date  . Coronary artery disease    5%blockage on left anterior descending Dr. Sallyanne Kuster   . Diverticulitis    hx of  . Enlarged prostate    takes flomax  . GERD (gastroesophageal reflux disease)   . History of blood clots    november, 5 1995  . Hyperlipidemia    primary Dr. Selena Batten  . Kidney stone    hx of sees Dr. Chauncey Cruel. [place]  . Seasonal allergies   . Urinary frequency    hx of     Past Surgical History:  Procedure Laterality Date  . ANTERIOR CERVICAL DECOMP/DISCECTOMY FUSION  01/28/2012   Procedure: ANTERIOR CERVICAL DECOMPRESSION/DISCECTOMY FUSION 2 LEVELS;  Surgeon: Kristeen Miss, MD;  Location: [facility];  Service: Neurosurgery;  Laterality: N/A;  Cervical six-seven, cervical seven-thoracic one Anterior cervical decompression/diskectomy/fusion  . CARDIAC CATHETERIZATION     09-06-11   . CARDIOVASCULAR STRESS TEST     2006 and 2008 at [facility] heart and vascular, 2011 myoview and echo  . COLONOSCOPY    . COLONOSCOPY N/A 08/30/2015   Procedure: COLONOSCOPY;  Surgeon: Aviva Signs, MD;  [facility];  Service: Gastroenterology;  Laterality: N/A;  . HEMORRHOID SURGERY     sept 8, 2008  . KIDNEY STONE SURGERY     lithotripsy 1980's  . KNEE SURGERY     left 04-11-94 ( has had two surgeries to left knee)  . NASAL SINUS SURGERY     Jun 30 1998  . NM MYOVIEW LTD  07/20/2009   no ischemia  . PILONIDAL CYST / SINUS EXCISION     surgery 01-08-74  . SHOULDER SURGERY     right 12-20-2003  . TRIGGER FINGER RELEASE     03-21-98 right hand ring finger  . US ECHOCARDIOGRAPHY  07/20/2009   LA & RA mildly dilated,trace MR,TR.    There were no vitals filed for this visit.      Subjective Assessment - 03/18/17 0948    Subjective Pt reports that he is feeling really good this morning. He feels like his p key-pinch grip is coming along nicely.   Currently in Pain? No/denies                [name] Adult PT Treatment/Exercise - 03/18/17 0001      Neck Exercises: Machines for Strengthening   Other Machines for Strengthening horizontal abduction cables: 2plates x10 reps  shoulder ext cables 2 plates x10 reps   Other Machines for Strengthening cable row standing 5 plates x 20 reps;  GHJ ER cables 2 plates x 10 reps     Neck Exercises: Standing   Other Standing Exercises Scapular T's BlueTB: x15     Hand Exercises for Cervical Radiculopathy   Pinch Grip black clips x5RT   Other Hand Exercise for Cervical Radiculopathy isometric key-pinch grip 10x10" holds with black clip   Other Hand Exercise for Cervical Radiculopathy pincher grip strengthening with velcro board using #7 tool on small strip x5RT     Manual Therapy   Manual Therapy Myofascial release   Myofascial Release L pec minor (painful)     Neck Exercises: Stretches   Other Neck Stretches doorway stretch 3x30 shoulders at 90/90, hands low on door 3x30 sec   Other Neck Stretches pec stretch on 1/2 foam roll x60 sec; manual pec minor stretch 3x30 sec               PT Education - 03/18/17 1013    Education provided Yes    Education Details updated HEP   Person(s) Educated Patient   Methods Explanation;Demonstration;Handout   Comprehension Verbalized understanding;Returned demonstration          PT Short Term Goals - 03/04/17 1036      PT SHORT TERM GOAL #1   Title Pt will be independent with HEP and perform consistently in order to maximize his overall function.   Time 3   Period Weeks   Status Achieved     PT SHORT TERM GOAL #2   Title Pt will have improved strength by at least 5# in his gross grasp and pincher grip strength in order to allow him to clip his fingernails with greater ease.   Baseline 9/24: gross grasp has improved 10#, pincher grip is the same as initial eval   Time 3   Period Weeks   Status Partially Met           PT Long Term Goals - 03/04/17 1037      PT LONG TERM GOAL #1   Title Pt will have improved strength by at least 10# in his gross grasp and pincher grip strength to maximize his ability to play golf.   Baseline 9/24: gross grasp has improved 10#, pincher grip is the same as initial eval   Time 6   Period Weeks   Status Partially Met     PT LONG TERM GOAL #2   Title Pt will report decreased numbness or tinging in his L thumb to demonstrate improved overall function and maximize ADLs at home.   Baseline 9/24: he reports intermittent flare-ups but overall the intensity and frequency of the n/t is decreasing; 40-50% improved in n/t   Time 6   Period Weeks   Status Partially Met     PT LONG TERM GOAL #3   Title Pt will have negative nerve glide testing for radial and median nerves to demonstrate improved overall function.    Baseline 9/24: Radial nerve glide test negative as pt did not have increase in symptoms; median still mildly positive as he had symptoms prior to median nerve test and his symptoms were present throughout test but did not increase   Time 6   Period Weeks   Status Partially Met               Plan - 03/18/17 1010    Clinical  Impression Statement Session focused on improving key-pinch grip, scap stab, and improving soft tissue restrictions of pec minor. Pt with palpable restrictions in pec minor  and he was tender to palpation. Pt's MD told him on 03/14/17 that there was no surgery scheduled and he was pleased with his progress. Continue POC as planned.   Rehab Potential Good   PT Frequency 2x / week   PT Duration 6 weeks   PT Treatment/Interventions ADLs/Self Care Home Management;Cryotherapy;Electrical Stimulation;Moist Heat;Traction;Therapeutic activities;Therapeutic exercise;Neuromuscular re-education;Patient/family education;Manual techniques;Passive range of motion;Dry needling;Taping   PT Next Visit Plan continue to address and progress grip strength, posture education and strengthening.; continue velcro for pincher grip strength; continue with pec minor release and stretch as tolerated.    PT Home Exercise Plan eval: self median nerve glide, supine cervical retractions, pink putty; 9/21: scap retraction and low rows with BTB, wall push-ups; 9/27: pectoral stretches and standing median nerve stretch.    Consulted and Agree with Plan of Care Patient      Patient will benefit from skilled therapeutic intervention in order to improve the following deficits and impairments:  Decreased strength, Hypomobility, Decreased range of motion, Increased muscle spasms, Impaired sensation, Impaired UE functional use, Postural dysfunction  Visit Diagnosis: Radiculopathy, cervical region  Muscle weakness (generalized)  Abnormal posture     Problem List Patient Active Problem List   Diagnosis Date Noted  . Mild obesity 09/20/2016  . Edema of both legs 01/17/2013  . Long term current use of anticoagulant therapy 12/30/2012  . History of deep vein thrombosis (DVT) of lower extremity 12/26/2012  . Dyslipidemia 12/26/2012  . Coronary atherosclerosis without stenosis 12/26/2012        Geraldine Solar PT, DPT  [facility] [address] [place], [place], [address] Phone: [phone]   Fax:  [phone]  Name: Casey Castillo MRN: [id number] Date of Birth: 04/08/1946

## 2017-03-18 NOTE — Patient Instructions (Signed)
  Pec Minor Stretch  Lying over foam roll or a large beach towel roll running vertically up spine, let arms hang out to sides feeling a stretch across the chest.  Perform 1-2x/day, 3-5 stretches holding for 30-60 seconds

## 2017-03-21 ENCOUNTER — Ambulatory Visit (HOSPITAL_COMMUNITY): Payer: PPO

## 2017-03-21 DIAGNOSIS — M5412 Radiculopathy, cervical region: Secondary | ICD-10-CM

## 2017-03-21 DIAGNOSIS — R293 Abnormal posture: Secondary | ICD-10-CM

## 2017-03-21 DIAGNOSIS — M6281 Muscle weakness (generalized): Secondary | ICD-10-CM

## 2017-03-21 NOTE — Therapy (Signed)
[place], [place], [address] Phone: [phone]   Fax:  [phone]  Physical Therapy Treatment  Patient Details  Name: Casey Castillo MRN: [id number] Date of Birth: Jul 03, 1945 Referring Provider: Kristeen Miss, MD  Encounter Date: 03/21/2017      PT End of Session - 03/21/17 0947    Visit Number 12   Number of Visits 13   Date for PT Re-Evaluation Apr 08, 2017   Authorization Type [employer]   Authorization Time Period 02-25-2017 to April 08, 2017 (g-codes done on Aug 29, 2022 visit)   Authorization - Visit Number 12   Authorization - Number of Visits 13   PT Start Time 0945   PT Stop Time 1025   PT Time Calculation (min) 40 min   Activity Tolerance Patient tolerated treatment well;No increased pain   Behavior During Therapy WFL for tasks assessed/performed      Past Medical History:  Diagnosis Date  . Coronary artery disease    5%blockage on left anterior descending Dr. Sallyanne Kuster   . Diverticulitis    hx of  . Enlarged prostate    takes flomax  . GERD (gastroesophageal reflux disease)   . History of blood clots    november, 5 1995  . Hyperlipidemia    primary Dr. Selena Batten  . Kidney stone    hx of sees Dr. Chauncey Cruel. [place]  . Seasonal allergies   . Urinary frequency    hx of     Past Surgical History:  Procedure Laterality Date  . ANTERIOR CERVICAL DECOMP/DISCECTOMY FUSION  01/28/2012   Procedure: ANTERIOR CERVICAL DECOMPRESSION/DISCECTOMY FUSION 2 LEVELS;  Surgeon: Kristeen Miss, MD;  Location: [facility];  Service: Neurosurgery;  Laterality: N/A;  Cervical six-seven, cervical seven-thoracic one Anterior cervical decompression/diskectomy/fusion  . CARDIAC CATHETERIZATION     09-06-11   . CARDIOVASCULAR STRESS TEST     2006 and 2008 at [facility] heart and vascular, 2011 myoview and echo  . COLONOSCOPY    . COLONOSCOPY N/A 08/30/2015   Procedure: COLONOSCOPY;  Surgeon: Aviva Signs, MD;  [facility];  Service: Gastroenterology;  Laterality: N/A;  . HEMORRHOID SURGERY     sept 8, 2008  . KIDNEY STONE SURGERY     lithotripsy 1980's  . KNEE SURGERY     left 04-11-94 ( has had two surgeries to left knee)  . NASAL SINUS SURGERY     Jun 30 1998  . NM MYOVIEW LTD  07/20/2009   no ischemia  . PILONIDAL CYST / SINUS EXCISION     surgery 01-08-74  . SHOULDER SURGERY     right 12-20-2003  . TRIGGER FINGER RELEASE     03-21-98 right hand ring finger  . US ECHOCARDIOGRAPHY  07/20/2009   LA & RA mildly dilated,trace MR,TR.    There were no vitals filed for this visit.      Subjective Assessment - 03/21/17 0947    Subjective Pt states that he felt good following last treatment session. His key-pinch grip is still is limiting factor but it's getting better.   Currently in Pain? No/denies                [facility] Adult PT Treatment/Exercise - 03/21/17 0001      Neck Exercises: Machines for Strengthening   UBE (Upper Arm Bike) L3 x4 mins (2 min fwd/retro each)    Other Machines for Strengthening horizontal abd cable: 3 plates 2x5; shoulder extension cables: 3 plates 2x5  Other Machines for Strengthening cable row: 5 plates 2x5 reps; [place] ER with towel 3 plates 2x5reps     Neck Exercises: Standing   Wall Wash LUE 2# x10 reps   Other Standing Exercises inverted Y's on wall 10x5"      Hand Exercises for Cervical Radiculopathy   Pinch Grip black clips x5RT   Other Hand Exercise for Cervical Radiculopathy isometric key-pinch grip 10x10" holds with black clip   Other Hand Exercise for Cervical Radiculopathy pincher grip strengthening with velcro board using #7 tool on small strip x5RT     Manual Therapy   Manual Therapy Myofascial release   Myofascial Release L pec minor (painful)     Neck Exercises: Stretches   Other Neck Stretches pec stretch on 1/2 foam roll x60 sec; manual pec minor stretch 3x30 sec              PT Education - 03/21/17 0948    Education provided Yes    Education Details will reassess next visit; put rubber band around clothes pin and use that to strengthen key-pinch grip at home   Person(s) Educated Patient   Methods Explanation;Demonstration   Comprehension Verbalized understanding;Returned demonstration          PT Short Term Goals - 03/04/17 1036      PT SHORT TERM GOAL #1   Title Pt will be independent with HEP and perform consistently in order to maximize his overall function.   Time 3   Period Weeks   Status Achieved     PT SHORT TERM GOAL #2   Title Pt will have improved strength by at least 5# in his gross grasp and pincher grip strength in order to allow him to clip his fingernails with greater ease.   Baseline 9/24: gross grasp has improved 10#, pincher grip is the same as initial eval   Time 3   Period Weeks   Status Partially Met           PT Long Term Goals - 03/04/17 1037      PT LONG TERM GOAL #1   Title Pt will have improved strength by at least 10# in his gross grasp and pincher grip strength to maximize his ability to play golf.   Baseline 9/24: gross grasp has improved 10#, pincher grip is the same as initial eval   Time 6   Period Weeks   Status Partially Met     PT LONG TERM GOAL #2   Title Pt will report decreased numbness or tinging in his L thumb to demonstrate improved overall function and maximize ADLs at home.   Baseline 9/24: he reports intermittent flare-ups but overall the intensity and frequency of the n/t is decreasing; 40-50% improved in n/t   Time 6   Period Weeks   Status Partially Met     PT LONG TERM GOAL #3   Title Pt will have negative nerve glide testing for radial and median nerves to demonstrate improved overall function.    Baseline 9/24: Radial nerve glide test negative as pt did not have increase in symptoms; median still mildly positive as he had symptoms prior to median nerve test and his symptoms were present throughout test but did not increase   Time 6   Period  Weeks   Status Partially Met               Plan - 03/21/17 1020    Clinical Impression Statement Progressed pt's shoulder strengthening and  scap stabilization exercises this date with good tolerance. Pt continues with L pec minor restrictions and tenderness to palpation. He verbalized that his key-pinch grip felt stronger during those strengthening exercises this date. Pt due for reassessment next session and will likely be discharged.    Rehab Potential Good   PT Frequency 2x / week   PT Duration 6 weeks   PT Treatment/Interventions ADLs/Self Care Home Management;Cryotherapy;Electrical Stimulation;Moist Heat;Traction;Therapeutic activities;Therapeutic exercise;Neuromuscular re-education;Patient/family education;Manual techniques;Passive range of motion;Dry needling;Taping   PT Next Visit Plan reassess   PT Home Exercise Plan eval: self median nerve glide, supine cervical retractions, pink putty; 9/21: scap retraction and low rows with BTB, wall push-ups; 9/27: pectoral stretches and standing median nerve stretch.    Consulted and Agree with Plan of Care Patient      Patient will benefit from skilled therapeutic intervention in order to improve the following deficits and impairments:  Decreased strength, Hypomobility, Decreased range of motion, Increased muscle spasms, Impaired sensation, Impaired UE functional use, Postural dysfunction  Visit Diagnosis: Radiculopathy, cervical region  Muscle weakness (generalized)  Abnormal posture     Problem List Patient Active Problem List   Diagnosis Date Noted  . Mild obesity 09/20/2016  . Edema of both legs 01/17/2013  . Long term current use of anticoagulant therapy 12/30/2012  . History of deep vein thrombosis (DVT) of lower extremity 12/26/2012  . Dyslipidemia 12/26/2012  . Coronary atherosclerosis without stenosis 12/26/2012       Geraldine Solar PT, DPT  [facility] [address] [place], [place], [address] Phone: [phone]   Fax:  [phone]  Name: Casey Castillo MRN: [id number] Date of Birth: 04-15-1946

## 2017-03-25 ENCOUNTER — Ambulatory Visit (HOSPITAL_COMMUNITY): Payer: PPO | Admitting: Physical Therapy

## 2017-03-25 DIAGNOSIS — M5412 Radiculopathy, cervical region: Secondary | ICD-10-CM

## 2017-03-25 DIAGNOSIS — M6281 Muscle weakness (generalized): Secondary | ICD-10-CM

## 2017-03-25 DIAGNOSIS — R293 Abnormal posture: Secondary | ICD-10-CM

## 2017-03-25 NOTE — Therapy (Signed)
[place], [place], [address] Phone: [phone]   Fax:  [phone]  Physical Therapy Treatment  Patient Details  Name: Casey Castillo MRN: [id number] Date of Birth: 1946-01-22 Referring Provider: Kristeen Miss   Encounter Date: 03/25/2017      PT End of Session - 03/25/17 1008    Visit Number 13   Number of Visits 13   PT Start Time 0930   PT Stop Time 1015   PT Time Calculation (min) 45 min      Past Medical History:  Diagnosis Date  . Coronary artery disease    5%blockage on left anterior descending Dr. Sallyanne Kuster   . Diverticulitis    hx of  . Enlarged prostate    takes flomax  . GERD (gastroesophageal reflux disease)   . History of blood clots    november, 5 1995  . Hyperlipidemia    primary Dr. Selena Batten  . Kidney stone    hx of sees Dr. Chauncey Cruel. [place]  . Seasonal allergies   . Urinary frequency    hx of     Past Surgical History:  Procedure Laterality Date  . ANTERIOR CERVICAL DECOMP/DISCECTOMY FUSION  01/28/2012   Procedure: ANTERIOR CERVICAL DECOMPRESSION/DISCECTOMY FUSION 2 LEVELS;  Surgeon: Kristeen Miss, MD;  Location: [place];  Service: Neurosurgery;  Laterality: N/A;  Cervical six-seven, cervical seven-thoracic one Anterior cervical decompression/diskectomy/fusion  . CARDIAC CATHETERIZATION     09-06-11   . CARDIOVASCULAR STRESS TEST     2006 and 2008 at [facility] heart and vascular, 2011 myoview and echo  . COLONOSCOPY    . COLONOSCOPY N/A 08/30/2015   Procedure: COLONOSCOPY;  Surgeon: Aviva Signs, MD;  [facility];  Service: Gastroenterology;  Laterality: N/A;  . HEMORRHOID SURGERY     sept 8, 2008  . KIDNEY STONE SURGERY     lithotripsy 1980's  . KNEE SURGERY     left 04-11-94 ( has had two surgeries to left knee)  . NASAL SINUS SURGERY     Jun 30 1998  . NM MYOVIEW LTD  07/20/2009   no ischemia  . PILONIDAL CYST / SINUS EXCISION     surgery 01-08-74  .  SHOULDER SURGERY     right 12-20-2003  . TRIGGER FINGER RELEASE     03-21-98 right hand ring finger  . US ECHOCARDIOGRAPHY  07/20/2009   LA & RA mildly dilated,trace MR,TR.    There were no vitals filed for this visit.      Subjective Assessment - 03/25/17 0936    Subjective Casey Castillo feels that he is doing better.  He is working on his strength at home.     Limitations Lifting;House hold activities   How long can you sit comfortably?  there has been no issues   How long can you stand comfortably? there has been no issues    How long can you walk comfortably? there has been no issues   Diagnostic tests MRI   Patient Stated Goals get strength back and ease numbness   Currently in Pain? No/denies   Pain Onset In the past 7 days            [facility] PT Assessment - 03/25/17 0001      Assessment   Medical Diagnosis cervical spondylosis with radiculopathy   Referring Provider Kristeen Miss    Onset Date/Surgical Date --  about 2 months ago   Next MD Visit 03/14/2017  Prior Therapy yes self-discharged from another local clinic due to that clinic not being in his insurance network     Balance Screen   Has the patient fallen in the past 6 months No   Has the patient had a decrease in activity level because of a fear of falling?  No   Is the patient reluctant to leave their home because of a fear of falling?  No     Prior Function   Level of Independence Independent;Independent with basic ADLs   Vocation Retired   Leisure dancing, golfing, working on old cars and truck     Cognition   Overall Cognitive Status Within Functional Limits for tasks assessed     Sensation   Light Touch Impaired Detail   Light Touch Impaired Details Impaired LUE   Additional Comments LUE: C6 increased tingling with light touch     Posture/Postural Control   Posture/Postural Control Postural limitations   Postural Limitations Rounded Shoulders;Forward head;Increased thoracic kyphosis     AROM    Overall AROM Comments WFL, non-painful, no change in LUE symptoms     Strength   Overall Strength Comments thumb ext 5/5 on R, 4/5 was 4/5  on L; hand intrinsics 5/5 on R, 4/5 on L; pincher grip:26# was  23# on R 12.5# was 11# on L   Right Shoulder Flexion 5/5   Right Shoulder ABduction 5/5   Right Shoulder Internal Rotation 5/5   Right Shoulder External Rotation 5/5   Left Shoulder Flexion 5/5   Left Shoulder ABduction 5/5  was 4+/5    Left Shoulder Internal Rotation 5/5   Left Shoulder External Rotation 5/5   Right Elbow Flexion 5/5   Right Elbow Extension 5/5   Left Elbow Flexion 5/5   Left Elbow Extension 5/5   Right Wrist Flexion 5/5   Right Wrist Extension 5/5   Left Wrist Flexion 5/5   Left Wrist Extension 5/5   Right Hand Gross Grasp Functional   Right Hand Grip (lbs) 85  was 78 PT is left handed    Left Hand Gross Grasp Impaired   Left Hand Grip (lbs) 66  was 62      Palpation   Palpation comment soft tissue restrictions of periscapular musculature but non-painful to palpation     Special Tests    Special Tests Cervical   Cervical Tests other;other2     other    Findings Positive   Side Left   Comment Medial nerve glide: (+) mild reproduction of symptoms     other    Findings Positive   Side Left   Comment Radial nerve glide: (+) mild reproduction of symptoms                     [facility] Adult PT Treatment/Exercise - 03/25/17 0001      Neck Exercises: Seated   Other Seated Exercise putty exercises for oppostition, thumb abduction; extension and grip strength all x 10 reps.                  PT Education - 03/25/17 1009    Education provided Yes   Education Details The importance of posture in controling his symptoms.    Person(s) Educated Patient   Methods Explanation   Comprehension Verbalized understanding          PT Short Term Goals - 03/25/17 0957      PT SHORT TERM GOAL #1   Title Pt will be   independent with HEP and  perform consistently in order to maximize his overall function.   Time 3   Period Weeks   Status Achieved     PT SHORT TERM GOAL #2   Title Pt will have improved strength by at least 5# in his gross grasp and pincher grip strength in order to allow him to clip his fingernails with greater ease.   Baseline 9/24: gross grasp has improved 10#, pincher grip is the same as initial eval   Time 3   Period Weeks   Status Partially Met           PT Long Term Goals - 03/25/17 0958      PT LONG TERM GOAL #1   Title Pt will have improved strength by at least 10# in his gross grasp and pincher grip strength to maximize his ability to play golf.   Baseline 9/24: gross grasp has improved 10#, pincher grip is the same as initial eval   Time 6   Period Weeks   Status Partially Met     PT LONG TERM GOAL #2   Title Pt will report decreased numbness or tinging in his L thumb to demonstrate improved overall function and maximize ADLs at home.   Baseline 9/24: he reports intermittent flare-ups but overall the intensity and frequency of the n/t is decreasing; 40-50% improved in n/t   Time 6   Period Weeks   Status Partially Met     PT LONG TERM GOAL #3   Title Pt will have negative nerve glide testing for radial and median nerves to demonstrate improved overall function.    Baseline 9/24: Radial nerve glide test negative as pt did not have increase in symptoms; median still mildly positive as he had symptoms prior to median nerve test and his symptoms were present throughout test but did not increase   Time 6   Period Weeks   Status Partially Met               Plan - 03/25/17 1009    Clinical Impression Statement Pt reassessed with slight improvement of hand strength, improved shoulder strength and knowledge of posture and body mechanics.  Pt feels he is ready for discharge.   Rehab Potential Good   PT Frequency 2x / week   PT Duration 6 weeks   PT Treatment/Interventions ADLs/Self  Care Home Management;Cryotherapy;Electrical Stimulation;Moist Heat;Traction;Therapeutic activities;Therapeutic exercise;Neuromuscular re-education;Patient/family education;Manual techniques;Passive range of motion;Dry needling;Taping   PT Next Visit Plan D/C to HEP    PT Home Exercise Plan eval: self median nerve glide, supine cervical retractions, pink putty; 9/21: scap retraction and low rows with BTB, wall push-ups; 9/27: pectoral stretches and standing median nerve stretch.    Consulted and Agree with Plan of Care Patient      Patient will benefit from skilled therapeutic intervention in order to improve the following deficits and impairments:  Decreased strength, Hypomobility, Decreased range of motion, Increased muscle spasms, Impaired sensation, Impaired UE functional use, Postural dysfunction  Visit Diagnosis: Radiculopathy, cervical region  Muscle weakness (generalized)  Abnormal posture       G-Codes - 03/25/17 0959    Functional Limitation Carrying, moving and handling objects   Carrying, Moving and Handling Objects Goal Status (G8985) At least 20 percent but less than 40 percent impaired, limited or restricted   Carrying, Moving and Handling Objects Discharge Status (G8986) At least 1 percent but less than 20 percent impaired, limited or restricted        Problem List Patient Active Problem List   Diagnosis Date Noted  . Mild obesity 09/20/2016  . Edema of both legs 01/17/2013  . Long term current use of anticoagulant therapy 12/30/2012  . History of deep vein thrombosis (DVT) of lower extremity 12/26/2012  . Dyslipidemia 12/26/2012  . Coronary atherosclerosis without stenosis 12/26/2012    Cynthia Russell, PT CLT [phone] 03/25/2017, 10:22 AM  [facility] [address] [place], , [address] Phone: [phone]   Fax:  [phone]  Name: Casey Castillo MRN: [id number] Date of Birth: 12/30/1945  PHYSICAL  THERAPY DISCHARGE SUMMARY  Visits from Start of Care: 13 Current functional level related to goals / functional outcomes: Pt can tell that his grip and pinch strength is better via opening jars; using nail clippers    Remaining deficits: Continues to have strength deficits    Education / Equipment: HEP Plan: Patient agrees to discharge.  Patient goals were partially met. Patient is being discharged due to being pleased with the current functional level.  ?????        Cynthia Russell, PT CLT [phone] 

## 2017-03-25 NOTE — Patient Instructions (Addendum)
Thumb Extension: Resisted    With right palm up, with rubber band,( or putty) around fingers and thumb, move thumb outward. Repeat __10__ times per set. Do _1___ sets per session. Do __2__ sessions per day.  Copyright  VHI. All rights reserved.  Thumb Abduction: Resisted    With right palm up, with rubber band, or putty around fingers and thumb, move thumb  away from palm. Repeat _10___ times per set. Do ____ sets per session. Do ____ sessions per day. 12Copyright  VHI. All rights reserved.  Thumb Abduction: Resisted    With right palm up, with rubber band around fingers and thumb, move thumb up away from palm. Repeat ____ times per set. Do ____ sets per session. Do ____ sessions per day.  Copyright  VHI. All rights reserved.  Thumb Opposition: Resisted   With rubber band around right thumb, hold other end with other hand. Rotate thumb up and over toward little finger.  Or putty in your palm bring thumb to little finger.  Repeat toward each finger. Repeat _10___ times per set. Do __1__ sets per session. Do __2__ sessions per day.  Copyright  VHI. All rights reserved.

## 2017-03-28 ENCOUNTER — Encounter (HOSPITAL_COMMUNITY): Payer: PPO

## 2017-04-01 ENCOUNTER — Encounter (HOSPITAL_COMMUNITY): Payer: PPO

## 2017-04-04 ENCOUNTER — Encounter (HOSPITAL_COMMUNITY): Payer: PPO

## 2017-04-08 ENCOUNTER — Encounter (HOSPITAL_COMMUNITY): Payer: PPO

## 2017-04-15 DIAGNOSIS — F4312 Post-traumatic stress disorder, chronic: Secondary | ICD-10-CM | POA: Diagnosis not present

## 2017-04-16 ENCOUNTER — Other Ambulatory Visit: Payer: Self-pay | Admitting: Urology

## 2017-04-16 ENCOUNTER — Ambulatory Visit: Payer: PPO | Admitting: Urology

## 2017-04-16 ENCOUNTER — Ambulatory Visit (HOSPITAL_COMMUNITY)
Admission: RE | Admit: 2017-04-16 | Discharge: 2017-04-16 | Disposition: A | Discharge status: Home / Self Care | Payer: PPO | Source: Ambulatory Visit | Attending: Urology | Admitting: Urology

## 2017-04-16 DIAGNOSIS — R1084 Generalized abdominal pain: Secondary | ICD-10-CM | POA: Insufficient documentation

## 2017-04-16 DIAGNOSIS — K7689 Other specified diseases of liver: Secondary | ICD-10-CM | POA: Diagnosis not present

## 2017-04-16 DIAGNOSIS — K4090 Unilateral inguinal hernia, without obstruction or gangrene, not specified as recurrent: Secondary | ICD-10-CM | POA: Insufficient documentation

## 2017-04-16 DIAGNOSIS — K8020 Calculus of gallbladder without cholecystitis without obstruction: Secondary | ICD-10-CM | POA: Insufficient documentation

## 2017-04-16 DIAGNOSIS — R109 Unspecified abdominal pain: Secondary | ICD-10-CM

## 2017-04-16 DIAGNOSIS — N281 Cyst of kidney, acquired: Secondary | ICD-10-CM | POA: Insufficient documentation

## 2017-04-18 DIAGNOSIS — Z1389 Encounter for screening for other disorder: Secondary | ICD-10-CM | POA: Diagnosis not present

## 2017-04-18 DIAGNOSIS — M545 Low back pain: Secondary | ICD-10-CM | POA: Diagnosis not present

## 2017-04-18 DIAGNOSIS — E6609 Other obesity due to excess calories: Secondary | ICD-10-CM | POA: Diagnosis not present

## 2017-04-18 DIAGNOSIS — Z6832 Body mass index (BMI) 32.0-32.9, adult: Secondary | ICD-10-CM | POA: Diagnosis not present

## 2017-05-01 DIAGNOSIS — X32XXXD Exposure to sunlight, subsequent encounter: Secondary | ICD-10-CM | POA: Diagnosis not present

## 2017-05-01 DIAGNOSIS — L821 Other seborrheic keratosis: Secondary | ICD-10-CM | POA: Diagnosis not present

## 2017-05-01 DIAGNOSIS — L570 Actinic keratosis: Secondary | ICD-10-CM | POA: Diagnosis not present

## 2017-05-08 DIAGNOSIS — K429 Umbilical hernia without obstruction or gangrene: Secondary | ICD-10-CM | POA: Diagnosis not present

## 2017-05-08 DIAGNOSIS — K4090 Unilateral inguinal hernia, without obstruction or gangrene, not specified as recurrent: Secondary | ICD-10-CM | POA: Diagnosis not present

## 2017-05-08 DIAGNOSIS — K8020 Calculus of gallbladder without cholecystitis without obstruction: Secondary | ICD-10-CM | POA: Diagnosis not present

## 2017-05-16 DIAGNOSIS — F4312 Post-traumatic stress disorder, chronic: Secondary | ICD-10-CM | POA: Diagnosis not present

## 2017-06-27 DIAGNOSIS — F4312 Post-traumatic stress disorder, chronic: Secondary | ICD-10-CM | POA: Diagnosis not present

## 2017-07-03 DIAGNOSIS — K8020 Calculus of gallbladder without cholecystitis without obstruction: Secondary | ICD-10-CM | POA: Diagnosis not present

## 2017-07-03 DIAGNOSIS — K429 Umbilical hernia without obstruction or gangrene: Secondary | ICD-10-CM | POA: Diagnosis not present

## 2017-07-03 DIAGNOSIS — K4090 Unilateral inguinal hernia, without obstruction or gangrene, not specified as recurrent: Secondary | ICD-10-CM | POA: Diagnosis not present

## 2017-07-09 DIAGNOSIS — L820 Inflamed seborrheic keratosis: Secondary | ICD-10-CM | POA: Diagnosis not present

## 2017-07-12 DIAGNOSIS — H6123 Impacted cerumen, bilateral: Secondary | ICD-10-CM | POA: Diagnosis not present

## 2017-07-24 DIAGNOSIS — K429 Umbilical hernia without obstruction or gangrene: Secondary | ICD-10-CM | POA: Diagnosis not present

## 2017-07-24 DIAGNOSIS — K8020 Calculus of gallbladder without cholecystitis without obstruction: Secondary | ICD-10-CM | POA: Diagnosis not present

## 2017-07-24 DIAGNOSIS — K4090 Unilateral inguinal hernia, without obstruction or gangrene, not specified as recurrent: Secondary | ICD-10-CM | POA: Diagnosis not present

## 2017-08-08 DIAGNOSIS — F4312 Post-traumatic stress disorder, chronic: Secondary | ICD-10-CM | POA: Diagnosis not present

## 2017-08-22 DIAGNOSIS — H524 Presbyopia: Secondary | ICD-10-CM | POA: Diagnosis not present

## 2017-08-29 DIAGNOSIS — E748 Other specified disorders of carbohydrate metabolism: Secondary | ICD-10-CM | POA: Diagnosis not present

## 2017-08-29 DIAGNOSIS — I2510 Atherosclerotic heart disease of native coronary artery without angina pectoris: Secondary | ICD-10-CM | POA: Diagnosis not present

## 2017-08-29 DIAGNOSIS — R7309 Other abnormal glucose: Secondary | ICD-10-CM | POA: Diagnosis not present

## 2017-08-29 DIAGNOSIS — Z0001 Encounter for general adult medical examination with abnormal findings: Secondary | ICD-10-CM | POA: Diagnosis not present

## 2017-08-29 DIAGNOSIS — Z6833 Body mass index (BMI) 33.0-33.9, adult: Secondary | ICD-10-CM | POA: Diagnosis not present

## 2017-08-29 DIAGNOSIS — E6609 Other obesity due to excess calories: Secondary | ICD-10-CM | POA: Diagnosis not present

## 2017-08-29 DIAGNOSIS — E782 Mixed hyperlipidemia: Secondary | ICD-10-CM | POA: Diagnosis not present

## 2017-08-29 DIAGNOSIS — N400 Enlarged prostate without lower urinary tract symptoms: Secondary | ICD-10-CM | POA: Diagnosis not present

## 2017-08-29 DIAGNOSIS — Z1389 Encounter for screening for other disorder: Secondary | ICD-10-CM | POA: Diagnosis not present

## 2017-09-18 DIAGNOSIS — K4090 Unilateral inguinal hernia, without obstruction or gangrene, not specified as recurrent: Secondary | ICD-10-CM | POA: Diagnosis not present

## 2017-09-18 DIAGNOSIS — K8020 Calculus of gallbladder without cholecystitis without obstruction: Secondary | ICD-10-CM | POA: Diagnosis not present

## 2017-09-18 DIAGNOSIS — K429 Umbilical hernia without obstruction or gangrene: Secondary | ICD-10-CM | POA: Diagnosis not present

## 2017-09-26 DIAGNOSIS — F4312 Post-traumatic stress disorder, chronic: Secondary | ICD-10-CM | POA: Diagnosis not present

## 2017-10-30 DIAGNOSIS — M542 Cervicalgia: Secondary | ICD-10-CM | POA: Diagnosis not present

## 2017-11-07 DIAGNOSIS — F4312 Post-traumatic stress disorder, chronic: Secondary | ICD-10-CM | POA: Diagnosis not present

## 2017-11-20 ENCOUNTER — Other Ambulatory Visit: Payer: Self-pay | Admitting: Cardiovascular Disease

## 2017-11-21 DIAGNOSIS — R030 Elevated blood-pressure reading, without diagnosis of hypertension: Secondary | ICD-10-CM | POA: Diagnosis not present

## 2017-11-21 DIAGNOSIS — Z6832 Body mass index (BMI) 32.0-32.9, adult: Secondary | ICD-10-CM | POA: Diagnosis not present

## 2017-11-21 DIAGNOSIS — M4722 Other spondylosis with radiculopathy, cervical region: Secondary | ICD-10-CM | POA: Diagnosis not present

## 2017-11-21 DIAGNOSIS — R29898 Other symptoms and signs involving the musculoskeletal system: Secondary | ICD-10-CM | POA: Diagnosis not present

## 2017-11-23 ENCOUNTER — Other Ambulatory Visit: Payer: Self-pay | Admitting: Neurological Surgery

## 2017-11-23 DIAGNOSIS — R29898 Other symptoms and signs involving the musculoskeletal system: Secondary | ICD-10-CM

## 2017-12-01 ENCOUNTER — Ambulatory Visit
Admission: RE | Admit: 2017-12-01 | Discharge: 2017-12-01 | Disposition: A | Discharge status: Home / Self Care | Payer: PPO | Source: Ambulatory Visit | Attending: Neurological Surgery | Admitting: Neurological Surgery

## 2017-12-01 DIAGNOSIS — M4802 Spinal stenosis, cervical region: Secondary | ICD-10-CM | POA: Diagnosis not present

## 2017-12-01 DIAGNOSIS — R29898 Other symptoms and signs involving the musculoskeletal system: Secondary | ICD-10-CM

## 2017-12-04 ENCOUNTER — Other Ambulatory Visit: Payer: PPO

## 2017-12-04 DIAGNOSIS — M4712 Other spondylosis with myelopathy, cervical region: Secondary | ICD-10-CM | POA: Diagnosis not present

## 2017-12-16 ENCOUNTER — Other Ambulatory Visit: Payer: Self-pay | Admitting: Neurological Surgery

## 2017-12-16 ENCOUNTER — Telehealth: Payer: Self-pay | Admitting: *Deleted

## 2017-12-16 NOTE — Telephone Encounter (Signed)
   Primary Cardiologist:Mihai Croitoru, MD  Chart reviewed as part of pre-operative protocol coverage. Because of [place] WENZLICK past medical history and time since last visit, he/she will require a follow-up visit in order to better assess preoperative cardiovascular risk.  He needs to keep appt 12/23/17.   Pre-op covering staff: - Please call patient to inform him of date and time of appt. - Please contact requesting surgeon's office via preferred method (i.e, phone, fax) to inform them of need for appointment prior to surgery.  Cecilie Kicks, NP  12/16/2017, 3:38 PM

## 2017-12-16 NOTE — Telephone Encounter (Signed)
   [facility] Pre-operative Risk Assessment    Request for surgical clearance:  1. What type of surgery is being performed? C4-5, C5-6 ARTIFICIAL DISC REPLACEMENT    2. When is this surgery scheduled? 12/24/17   3. What type of clearance is required (medical clearance vs. Pharmacy clearance to hold med vs. Both)?   4. Are there any medications that need to be held prior to surgery and how long?   5. Practice name and name of physician performing surgery? [facility]    6. What is your office phone number? [phone]    7.   What is your office fax number?  [place]   8.   Anesthesia type (None, local, MAC,  general) ? UNKNOWN

## 2017-12-18 NOTE — Pre-Procedure Instructions (Signed)
   Casey Castillo  12/18/2017     [id number] [place], [place] [address] [place] [address] Phone: [phone] Fax: [phone]  [facility], [place] [address] Phone: [phone] Fax: [phone]   Your procedure is scheduled on Tuesday, December 24, 2017  Report to [facility] Admitting at 1:50 P.M.  Call this number if you have problems the morning of surgery:  [phone]   Remember:   Do not eat or drink after midnight Monday, December 23, 2017   Take these medicines the morning of surgery with A SIP OF WATER : loratadine (CLARITIN),  pantoprazole (PROTONIX),  simvastatin (ZOCOR),  Tamsulosin HCl (FLOMAX),  if needed: acetaminophen (TYLENOL) for pain,  Tetrahydrozoline HCl (VISINE) eye drops for irritation Stop taking Aspirin (unless advised otherwise by surgeon ), vitamins, Biotin, Lysine, fish oil and herbal medications. Do not take any NSAIDs ie: Ibuprofen, Advil, Naproxen (Aleve), Motrin, BC and Goody Powder; stop now.  Do not wear jewelry, make-up or nail polish.  Do not wear lotions, powders, or perfumes, or deodorant.  Do not shave 48 hours prior to surgery.  Men may shave face and neck.  Do not bring valuables to the hospital.  [facility] is not responsible for any belongings or valuables.  Contacts, dentures or bridgework may not be worn into surgery.  Leave your suitcase in the car.  After surgery it may be brought to your room. Special instructions:Shower the night before and morning of surgery with CHG. Please read over the following fact sheets that you were given. Pain Booklet, Coughing and Deep Breathing, MRSA Information and Surgical Site Infection Prevention

## 2017-12-19 ENCOUNTER — Encounter (HOSPITAL_COMMUNITY): Payer: Self-pay

## 2017-12-19 ENCOUNTER — Encounter (HOSPITAL_COMMUNITY)
Admission: RE | Admit: 2017-12-19 | Discharge: 2017-12-19 | Disposition: A | Discharge status: Home / Self Care | Payer: PPO | Source: Ambulatory Visit | Attending: Neurological Surgery | Admitting: Neurological Surgery

## 2017-12-19 ENCOUNTER — Other Ambulatory Visit: Payer: Self-pay

## 2017-12-19 ENCOUNTER — Other Ambulatory Visit: Payer: Self-pay | Admitting: Neurological Surgery

## 2017-12-19 DIAGNOSIS — Z01812 Encounter for preprocedural laboratory examination: Secondary | ICD-10-CM | POA: Diagnosis not present

## 2017-12-19 DIAGNOSIS — Z01810 Encounter for preprocedural cardiovascular examination: Secondary | ICD-10-CM | POA: Insufficient documentation

## 2017-12-19 DIAGNOSIS — I2510 Atherosclerotic heart disease of native coronary artery without angina pectoris: Secondary | ICD-10-CM | POA: Diagnosis not present

## 2017-12-19 DIAGNOSIS — R001 Bradycardia, unspecified: Secondary | ICD-10-CM | POA: Diagnosis not present

## 2017-12-19 HISTORY — DX: Unspecified osteoarthritis, unspecified site: M19.90

## 2017-12-19 HISTORY — DX: Pneumonia, unspecified organism: J18.9

## 2017-12-19 HISTORY — DX: Disease of spinal cord, unspecified: G95.9

## 2017-12-19 HISTORY — DX: Post-traumatic stress disorder, unspecified: F43.10

## 2017-12-19 HISTORY — DX: Other spondylosis with myelopathy, cervical region: M47.12

## 2017-12-19 HISTORY — DX: Radiculopathy, cervical region: M54.12

## 2017-12-19 LAB — BASIC METABOLIC PANEL
Anion gap: 10 (ref 5–15)
BUN: 10 mg/dL (ref 8–23)
CALCIUM: 9.2 mg/dL (ref 8.9–10.3)
CHLORIDE: 109 mmol/L (ref 98–111)
CO2: 23 mmol/L (ref 22–32)
CREATININE: 1.14 mg/dL (ref 0.61–1.24)
GFR calc Af Amer: >60 mL/min (ref >60)
GFR calc non Af Amer: >60 mL/min (ref >60)
Glucose, Bld: 124 mg/dL — ABNORMAL HIGH (ref 70–99)
Potassium: 4.2 mmol/L (ref 3.5–5.1)
SODIUM: 142 mmol/L (ref 135–145)

## 2017-12-19 LAB — CBC
HCT: 46.6 % (ref 39.0–52.0)
Hemoglobin: 14.9 g/dL (ref 13.0–17.0)
MCH: 28.2 pg (ref 26.0–34.0)
MCHC: 32 g/dL (ref 30.0–36.0)
MCV: 88.1 fL (ref 78.0–100.0)
PLATELETS: 180 10*3/uL (ref 150–400)
RBC: 5.29 MIL/uL (ref 4.22–5.81)
RDW: 12.7 % (ref 11.5–15.5)
WBC: 7.4 10*3/uL (ref 4.0–10.5)

## 2017-12-19 LAB — TYPE AND SCREEN
ABO/RH(D): O POS
ANTIBODY SCREEN: NEGATIVE

## 2017-12-19 LAB — SURGICAL PCR SCREEN
MRSA, PCR: NEGATIVE
Staphylococcus aureus: NEGATIVE

## 2017-12-19 NOTE — Progress Notes (Signed)
Pt denies any acute cardiopulmonary issues. Pt under the care of Dr. Sallyanne Kuster, Cardiology. Pt scheduled to see cardiologist on 12/23/17 to be cleared for surgery. Pt denies having an EKG and chest x ray within the last year. Pt denies recent labs. Pt stated that last dose of Aspirin was " Sunday or Monday " as instructed by surgeon.  Pt chart forwarded to anesthesia for review.

## 2017-12-20 NOTE — Progress Notes (Addendum)
Anesthesia Chart Review:   Case:  [id number] Date/Time:  12/24/17 1535   Procedure:  ANTERIOR CERVICAL DECOMPRESSION/DISCECTOMY FUSION C4-5/C5-6 2 LEVELS (N/A ) - Cervical 4-5 Cervical 5-6 anterior cervical fusion   Anesthesia type:  General   Pre-op diagnosis:  Cervical myelopathy with cervical radiculopathy   Location:  [facility] OR ROOM 21 / [facility]   Surgeon:  Kristeen Miss, MD     Addendum 12/23/17:   Clarnce Flock cardiologist Mihai Croitoru, MD 12/23/17 and was cleared for surgery at low risk   DISCUSSION: - Pt is a 72 year old male with hx CAD (by notes, minimal LAD irregularities on remote cath 2006), DVT  - Pt has appt with cardiology for surgical clearance 12/23/17.    VS: BP 121/73   Pulse 73   Temp 36.6 C   Resp 20   Ht 5\' 10"  (1.778 m)   Wt 224 lb 1.6 oz (101.7 kg)   SpO2 98%   BMI 32.16 kg/m   PROVIDERS: - PCP is Redmond School, MD - Cardiologist is Sanda Klein, MD.   LABS: Labs reviewed: Acceptable for surgery. (all labs ordered are listed, but only abnormal results are displayed)  Labs Reviewed  BASIC METABOLIC PANEL - Abnormal; Notable for the following components:      Result Value   Glucose, Bld 124 (*)    All other components within normal limits  SURGICAL PCR SCREEN  CBC  TYPE AND SCREEN    EKG 12/19/17: Sinus bradycardia (59 bpm) with 1st degree A-V block. Minimal voltage criteria for LVH, may be normal variant   CV:  Nuclear stress test 08/24/16:  1.  Heart rate response to exercise appropriate. 2.  BP response to exercise: Normal resting BP, appropriate response. 3.  No chest pain.  No arrhythmia. 4.  ST changes: Depression upsloping. 5.  Overall impression: Equivocal stress test. - Dr. Sallyanne Kuster documents "on my review of the tracings I think it's a normal test"   Carotid duplex 08/17/13:  1. Mild bilateral carotid atherosclerotic vascular disease. No hemodynamically significant stenosis. 2. Vertebrals are patent with antegrade flow.  Echo 01/22/13:   - Left ventricle: The cavity size was normal. There was mildconcentric hypertrophy. Systolic function was normal. Theestimated ejection fraction was in the range of 55% to60%. Wall motion was normal; there were no regional wallmotion abnormalities. Left ventricular diastolic functionparameters were normal. - Atrial septum: No defect or patent foramen ovale was identified.   Past Medical History:  Diagnosis Date  . Arthritis   . Cervical myelopathy with cervical radiculopathy   . Coronary artery disease    5%blockage on left anterior descending Dr. Sallyanne Kuster   . Diverticulitis    hx of  . Enlarged prostate    takes flomax  . GERD (gastroesophageal reflux disease)   . History of blood clots    november, 5 1995  . Hyperlipidemia    primary Dr. Selena Batten  . Kidney stone    hx of sees Dr. Chauncey Cruel. [place]  . Pneumonia    as a child  . PTSD (post-traumatic stress disorder)   . Seasonal allergies   . Urinary frequency    hx of     Past Surgical History:  Procedure Laterality Date  . ANTERIOR CERVICAL DECOMP/DISCECTOMY FUSION  01/28/2012   Procedure: ANTERIOR CERVICAL DECOMPRESSION/DISCECTOMY FUSION 2 LEVELS;  Surgeon: Kristeen Miss, MD;  Location: [place];  Service: Neurosurgery;  Laterality: N/A;  Cervical six-seven, cervical seven-thoracic one Anterior cervical decompression/diskectomy/fusion  .  CARDIAC CATHETERIZATION     09-06-11   . CARDIOVASCULAR STRESS TEST     2006 and 2008 at [facility] heart and vascular, 2011 myoview and echo  . COLONOSCOPY    . COLONOSCOPY N/A 08/30/2015   Procedure: COLONOSCOPY;  Surgeon: Aviva Signs, MD;  [facility];  Service: Gastroenterology;  Laterality: N/A;  . EYE SURGERY     Laser for torn retina  . HEMORRHOID SURGERY     sept 8, 2008  . KIDNEY STONE SURGERY     lithotripsy 1980's  . KNEE SURGERY     left 04-11-94 ( has had two surgeries to left knee)  . NASAL SINUS SURGERY     Jun 30 1998  . NM MYOVIEW LTD  07/20/2009    no ischemia  . PILONIDAL CYST / SINUS EXCISION     surgery 01-08-74  . SHOULDER SURGERY     right 12-20-2003  . TRIGGER FINGER RELEASE     03-21-98 right hand ring finger  . US ECHOCARDIOGRAPHY  07/20/2009   LA & RA mildly dilated,trace MR,TR.    MEDICATIONS: . acetaminophen (TYLENOL) 650 MG CR tablet  . aspirin EC 81 MG tablet  . Biotin 5 MG TBDP  . calcium elemental as carbonate (TUMS ULTRA 1000) 400 MG chewable tablet  . Cholecalciferol (VITAMIN D) 2000 UNITS CAPS  . clobetasol cream (TEMOVATE) 0.05 %  . diphenhydrAMINE (BENADRYL) 50 MG tablet  . loratadine (CLARITIN) 10 MG tablet  . Lysine HCl 500 MG TABS  . Melatonin 5 MG TABS  . naproxen sodium (ALEVE) 220 MG tablet  . Omega-3 Fatty Acids (FISH OIL) 1200 MG CAPS  . oxybutynin (DITROPAN-XL) 5 MG 24 hr tablet  . pantoprazole (PROTONIX) 40 MG tablet  . simvastatin (ZOCOR) 40 MG tablet  . tadalafil (CIALIS) 5 MG tablet  . Tamsulosin HCl (FLOMAX) 0.4 MG CAPS  . Tetrahydrozoline HCl (VISINE OP)   No current facility-administered medications for this encounter.     If no changes, I anticipate pt can proceed with surgery as scheduled.   Willeen Cass, FNP-BC [facility] Short Stay Surgical Center/Anesthesiology Phone: [phone] 12/23/2017 4:07 PM

## 2017-12-23 ENCOUNTER — Ambulatory Visit: Payer: PPO | Admitting: Cardiovascular Disease

## 2017-12-23 ENCOUNTER — Encounter: Payer: Self-pay | Admitting: Cardiovascular Disease

## 2017-12-23 ENCOUNTER — Encounter

## 2017-12-23 VITALS — BP 122/74 | HR 67 | Ht 70.0 in | Wt 221.0 lb

## 2017-12-23 DIAGNOSIS — I2510 Atherosclerotic heart disease of native coronary artery without angina pectoris: Secondary | ICD-10-CM

## 2017-12-23 DIAGNOSIS — E7800 Pure hypercholesterolemia, unspecified: Secondary | ICD-10-CM | POA: Insufficient documentation

## 2017-12-23 DIAGNOSIS — E669 Obesity, unspecified: Secondary | ICD-10-CM | POA: Diagnosis not present

## 2017-12-23 DIAGNOSIS — E785 Hyperlipidemia, unspecified: Secondary | ICD-10-CM | POA: Diagnosis not present

## 2017-12-23 DIAGNOSIS — Z86718 Personal history of other venous thrombosis and embolism: Secondary | ICD-10-CM

## 2017-12-23 DIAGNOSIS — Z01810 Encounter for preprocedural cardiovascular examination: Secondary | ICD-10-CM

## 2017-12-23 MED ORDER — SIMVASTATIN 40 MG PO TABS: 40.0000 mg | ORAL_TABLET | Freq: Every day | ORAL | 3 refills | Status: DC

## 2017-12-23 NOTE — Patient Instructions (Signed)
Dr Croitoru recommends that you schedule a follow-up appointment in 12 months. You will receive a reminder letter in the mail two months in advance. If you don't receive a letter, please call our office to schedule the follow-up appointment.  If you need a refill on your cardiac medications before your next appointment, please call your pharmacy. 

## 2017-12-23 NOTE — Progress Notes (Signed)
Patient ID: Casey Castillo, male   DOB: June 27, 1945, 72 y.o.   MRN: [id number]    Cardiology Office Note    Date:  12/23/2017   ID:  Casey Castillo, DOB October 08, 1945, MRN [id number]  PCP:  Redmond School, MD  Cardiologist:   Sanda Klein, MD   Chief Complaint  Patient presents with  . Follow-up    Pre op clearance/12 months.    History of Present Illness:  Casey Castillo is a 72 y.o. male  With a history of remote DVT of the lower extremity, no longer on anticoagulation as well as a history of coronary atherosclerosis without severe stenoses (remote coronary angiography) and hyperlipidemia.  He is scheduled to undergo a C4-C5/C5-C6 anterior cervical decompression discectomy fusion with Dr. Ellene Route.  He was playing golf when he suddenly noticed the discomfort in the left side of his neck and the next morning had weak grasp in his left hand.  Otherwise he has not had any cardiovascular events.  He is physically very active.  Just 7 acres of land all by himself.  He walks on the [place] in [place] (1.7 miles) 2 or 3 times a month.  The patient specifically denies any chest pain at rest exertion, dyspnea at rest or with exertion, orthopnea, paroxysmal nocturnal dyspnea, syncope, palpitations, focal neurological deficits (other than the recent ones attributable to left cervical nerve compression), intermittent claudication, lower extremity edema, unexplained weight gain, cough, hemoptysis or wheezing.  On 08/24/2016 he underwent a treadmill stress test in [place]. He was able to exercise for 9 minutes achieving 10 mets and a maximum heart rate of 107% of predicted for age. His blood pressure response was normal and no ischemic ST changes were seen. The study result was deemed to be equivocal due to upsloping ST segment depression changes, however on my review of the tracings I think it's a normal test.  Recent labs were performed in [place] and showed hemoglobin A1c 5.5%,  glucose 114, potassium 4.6, creatinine 1.1, normal liver function tests, total cholesterol 159, HDL cholesterol 46, LDL cholesterol 94, triglycerides 96.  Past Medical History:  Diagnosis Date  . Arthritis   . Cervical myelopathy with cervical radiculopathy   . Coronary artery disease    5%blockage on left anterior descending Dr. Sallyanne Kuster   . Diverticulitis    hx of  . Enlarged prostate    takes flomax  . GERD (gastroesophageal reflux disease)   . History of blood clots    november, 5 1995  . Hyperlipidemia    primary Dr. Selena Batten  . Kidney stone    hx of sees Dr. Chauncey Cruel. [place] [facility]  . Pneumonia    as a child  . PTSD (post-traumatic stress disorder)   . Seasonal allergies   . Urinary frequency    hx of     Past Surgical History:  Procedure Laterality Date  . ANTERIOR CERVICAL DECOMP/DISCECTOMY FUSION  01/28/2012   Procedure: ANTERIOR CERVICAL DECOMPRESSION/DISCECTOMY FUSION 2 LEVELS;  Surgeon: Kristeen Miss, MD;  Location: [facility];  Service: Neurosurgery;  Laterality: N/A;  Cervical six-seven, cervical seven-thoracic one Anterior cervical decompression/diskectomy/fusion  . CARDIAC CATHETERIZATION     09-06-11   . CARDIOVASCULAR STRESS TEST     2006 and 2008 at [facility] vascular, 2011 myoview and echo  . COLONOSCOPY    . COLONOSCOPY N/A 08/30/2015   Procedure: COLONOSCOPY;  Surgeon: Aviva Signs, MD;  [facility];  Service: Gastroenterology;  Laterality: N/A;  .  EYE SURGERY     Laser for torn retina  . HEMORRHOID SURGERY     sept 8, 2008  . KIDNEY STONE SURGERY     lithotripsy 1980's  . KNEE SURGERY     left 04-11-94 ( has had two surgeries to left knee)  . NASAL SINUS SURGERY     Jun 30 1998  . NM MYOVIEW LTD  07/20/2009   no ischemia  . PILONIDAL CYST / SINUS EXCISION     surgery 01-08-74  . SHOULDER SURGERY     right 12-20-2003  . TRIGGER FINGER RELEASE     03-21-98 right hand ring finger  . US ECHOCARDIOGRAPHY  07/20/2009   LA & RA mildly  dilated,trace MR,TR.    Current Outpatient Medications  Medication Sig Dispense Refill  . acetaminophen (TYLENOL) 650 MG CR tablet Take 1,300 mg by mouth daily as needed for pain.    Marland Kitchen aspirin EC 81 MG tablet Take 81 mg by mouth daily.    . Biotin 5 MG TBDP Take 5 mg by mouth daily.     . calcium elemental as carbonate (TUMS ULTRA 1000) 400 MG chewable tablet Chew 2,000-3,000 mg by mouth daily as needed for heartburn.    . Cholecalciferol (VITAMIN D) 2000 UNITS CAPS Take 2,000 Units by mouth daily.     . clobetasol cream (TEMOVATE) 8.18 % Apply 1 application topically 2 (two) times daily as needed (rash).     . diphenhydrAMINE (BENADRYL) 50 MG tablet Take 50 mg by mouth at bedtime as needed for sleep.    Marland Kitchen loratadine (CLARITIN) 10 MG tablet Take 10 mg by mouth daily.    Marland Kitchen Lysine HCl 500 MG TABS Take 500 mg by mouth daily.    . Melatonin 5 MG TABS Take 5 mg by mouth at bedtime as needed (sleep).    . Omega-3 Fatty Acids (FISH OIL) 1200 MG CAPS Take 1,200 mg by mouth daily.     Marland Kitchen oxybutynin (DITROPAN-XL) 5 MG 24 hr tablet Take 2.5 mg by mouth daily.     . pantoprazole (PROTONIX) 40 MG tablet Take 40 mg by mouth daily.   2  . simvastatin (ZOCOR) 40 MG tablet Take 1 tablet (40 mg total) by mouth daily. 90 tablet 3  . tadalafil (CIALIS) 5 MG tablet Take 5 mg by mouth daily.     . Tamsulosin HCl (FLOMAX) 0.4 MG CAPS Take 0.4 mg by mouth 2 (two) times daily.     . naproxen sodium (ALEVE) 220 MG tablet Take 220 mg by mouth daily as needed (pain).     No current facility-administered medications for this visit.     Allergies:   Adhesive [tape] and Other   Social History   Socioeconomic History  . Marital status: Widowed    Spouse name: Not on file  . Number of children: Not on file  . Years of education: Not on file  . Highest education level: Not on file  Occupational History  . Not on file  Social Needs  . Financial resource strain: Not on file  . Food insecurity:    Worry: Not on  file    Inability: Not on file  . Transportation needs:    Medical: Not on file    Non-medical: Not on file  Tobacco Use  . Smoking status: Former Smoker    Packs/day: 1.00    Years: 20.00    Pack years: 20.00    Types: Cigarettes  . Smokeless tobacco: Never  Used  . Tobacco comment: quit smokin cigarettes at age 72  Substance and Sexual Activity  . Alcohol use: No  . Drug use: No  . Sexual activity: Not on file  Lifestyle  . Physical activity:    Days per week: Not on file    Minutes per session: Not on file  . Stress: Not on file  Relationships  . Social connections:    Talks on phone: Not on file    Gets together: Not on file    Attends religious service: Not on file    Active member of club or organization: Not on file    Attends meetings of clubs or organizations: Not on file    Relationship status: Not on file  Other Topics Concern  . Not on file  Social History Narrative  . Not on file     Family History:  The patient's family history includes Dementia in his mother; Heart attack in his father; Heart disease in his other.   ROS:   Please see the history of present illness.    Review of Systems  Musculoskeletal: Positive for arthritis, joint pain and joint swelling.   All other systems reviewed and are negative.   PHYSICAL EXAM:   VS:  BP 122/74 (BP Location: Left Arm, Patient Position: Sitting, Cuff Size: Normal)   Pulse 67   Ht 5\' 10"  (1.778 m)   Wt 221 lb (100.2 kg)   BMI 31.71 kg/m     General: Alert, oriented x3, no distress, mildly obese Head: no evidence of trauma, PERRL, EOMI, no exophtalmos or lid lag, no myxedema, no xanthelasma; normal ears, nose and oropharynx Neck: normal jugular venous pulsations and no hepatojugular reflux; brisk carotid pulses without delay and no carotid bruits Chest: clear to auscultation, no signs of consolidation by percussion or palpation, normal fremitus, symmetrical and full respiratory excursions Cardiovascular:  normal position and quality of the apical impulse, regular rhythm, normal first and second heart sounds, no murmurs, rubs or gallops Abdomen: no tenderness or distention, no masses by palpation, no abnormal pulsatility or arterial bruits, normal bowel sounds, no hepatosplenomegaly Extremities: no clubbing, cyanosis or edema; 2+ radial, ulnar and brachial pulses bilaterally; 2+ right femoral, posterior tibial and dorsalis pedis pulses; 2+ left femoral, posterior tibial and dorsalis pedis pulses; no subclavian or femoral bruits Neurological: Weak grasp 3/5 left hand Psych: Normal mood and affect   Wt Readings from Last 3 Encounters:  12/23/17 221 lb (100.2 kg)  12/19/17 224 lb 1.6 oz (101.7 kg)  09/20/16 223 lb 9.6 oz (101.4 kg)      Studies/Labs Reviewed:   EKG:  EKG is not ordered today.  The ekg December 19, 2017 shows normal sinus rhythm and borderline first-degree AV block, normal tracing  Recent Labs: August 29, 2017 hemoglobin A1c 5.5%, glucose 114, potassium 4.6, creatinine 1.1, normal liver function tests  Lipid Panel total cholesterol 159, HDL 46, LDL 94, triglycerides 96.  ASSESSMENT:    1. Coronary artery disease involving native coronary artery of native heart without angina pectoris   2. Hypercholesterolemia   3. Mild obesity   4. History of DVT (deep vein thrombosis)   5. Preoperative cardiovascular examination      PLAN:  In order of problems listed above:    1. CAD:  Physically active lifestyle without angina pectoris.  Continue statin. 2. HLP:  All lipid parameters have improved and are within target range 3. Obesity: He asked about the keto diet to help lose  weight.  I advised him that this is a successful diet for weight loss, but I would recommend a more moderate intervention closer to a [employer] pattern. 4. Hx of DVT:  No recurrence.  He has broken up with his girlfriend in [place] and no longer takes the long car trips. 5. Preop CV eval: Low risk for  the planned cervical spine surgery from a cardiac point of view.  Medication Adjustments/Labs and Tests Ordered: Current medicines are reviewed at length with the patient today.  Concerns regarding medicines are outlined above.  Medication changes, Labs and Tests ordered today are listed below. Patient Instructions  Dr Sallyanne Kuster recommends that you schedule a follow-up appointment in 12 months. You will receive a reminder letter in the mail two months in advance. If you don't receive a letter, please call our office to schedule the follow-up appointment.  If you need a refill on your cardiac medications before your next appointment, please call your pharmacy.   Signed, Sanda Klein, MD  12/23/2017 11:11 AM    [facility] [place], [address] [place], [place]  [address] Phone: [phone]; Fax: [phone]

## 2017-12-24 ENCOUNTER — Encounter (HOSPITAL_COMMUNITY)
Admission: RE | Disposition: A | Discharge status: Home / Self Care | Payer: Self-pay | Source: Ambulatory Visit | Attending: Neurological Surgery

## 2017-12-24 ENCOUNTER — Other Ambulatory Visit: Payer: Self-pay

## 2017-12-24 ENCOUNTER — Ambulatory Visit (HOSPITAL_COMMUNITY): Payer: PPO | Admitting: Emergency Medicine

## 2017-12-24 ENCOUNTER — Ambulatory Visit (HOSPITAL_COMMUNITY): Payer: PPO | Admitting: Certified Registered Nurse Anesthetist

## 2017-12-24 ENCOUNTER — Ambulatory Visit (HOSPITAL_COMMUNITY): Payer: PPO

## 2017-12-24 ENCOUNTER — Observation Stay (HOSPITAL_COMMUNITY)
Admission: RE | Admit: 2017-12-24 | Discharge: 2017-12-25 | Disposition: A | Discharge status: Home / Self Care | Payer: PPO | Source: Ambulatory Visit | Attending: Neurological Surgery | Admitting: Neurological Surgery

## 2017-12-24 ENCOUNTER — Encounter (HOSPITAL_COMMUNITY): Payer: Self-pay | Admitting: General Practice

## 2017-12-24 DIAGNOSIS — Z87891 Personal history of nicotine dependence: Secondary | ICD-10-CM | POA: Diagnosis not present

## 2017-12-24 DIAGNOSIS — M4722 Other spondylosis with radiculopathy, cervical region: Secondary | ICD-10-CM | POA: Diagnosis not present

## 2017-12-24 DIAGNOSIS — I2510 Atherosclerotic heart disease of native coronary artery without angina pectoris: Secondary | ICD-10-CM | POA: Insufficient documentation

## 2017-12-24 DIAGNOSIS — F4310 Post-traumatic stress disorder, unspecified: Secondary | ICD-10-CM | POA: Diagnosis not present

## 2017-12-24 DIAGNOSIS — G959 Disease of spinal cord, unspecified: Secondary | ICD-10-CM | POA: Diagnosis not present

## 2017-12-24 DIAGNOSIS — Z79899 Other long term (current) drug therapy: Secondary | ICD-10-CM | POA: Diagnosis not present

## 2017-12-24 DIAGNOSIS — Z6831 Body mass index (BMI) 31.0-31.9, adult: Secondary | ICD-10-CM | POA: Diagnosis not present

## 2017-12-24 DIAGNOSIS — E785 Hyperlipidemia, unspecified: Secondary | ICD-10-CM | POA: Insufficient documentation

## 2017-12-24 DIAGNOSIS — M4712 Other spondylosis with myelopathy, cervical region: Secondary | ICD-10-CM | POA: Diagnosis not present

## 2017-12-24 DIAGNOSIS — G9520 Unspecified cord compression: Secondary | ICD-10-CM | POA: Diagnosis not present

## 2017-12-24 DIAGNOSIS — Z888 Allergy status to other drugs, medicaments and biological substances status: Secondary | ICD-10-CM | POA: Insufficient documentation

## 2017-12-24 DIAGNOSIS — Z8249 Family history of ischemic heart disease and other diseases of the circulatory system: Secondary | ICD-10-CM | POA: Diagnosis not present

## 2017-12-24 DIAGNOSIS — M4322 Fusion of spine, cervical region: Secondary | ICD-10-CM | POA: Diagnosis not present

## 2017-12-24 DIAGNOSIS — Z419 Encounter for procedure for purposes other than remedying health state, unspecified: Secondary | ICD-10-CM

## 2017-12-24 DIAGNOSIS — K219 Gastro-esophageal reflux disease without esophagitis: Secondary | ICD-10-CM | POA: Diagnosis not present

## 2017-12-24 DIAGNOSIS — Z981 Arthrodesis status: Secondary | ICD-10-CM | POA: Diagnosis not present

## 2017-12-24 DIAGNOSIS — M5412 Radiculopathy, cervical region: Secondary | ICD-10-CM | POA: Diagnosis not present

## 2017-12-24 DIAGNOSIS — Z79891 Long term (current) use of opiate analgesic: Secondary | ICD-10-CM | POA: Diagnosis not present

## 2017-12-24 DIAGNOSIS — Z87442 Personal history of urinary calculi: Secondary | ICD-10-CM | POA: Diagnosis not present

## 2017-12-24 DIAGNOSIS — N400 Enlarged prostate without lower urinary tract symptoms: Secondary | ICD-10-CM | POA: Diagnosis not present

## 2017-12-24 DIAGNOSIS — Z7982 Long term (current) use of aspirin: Secondary | ICD-10-CM | POA: Insufficient documentation

## 2017-12-24 HISTORY — PX: ANTERIOR CERVICAL DECOMP/DISCECTOMY FUSION: SHX1161

## 2017-12-24 SURGERY — ANTERIOR CERVICAL DECOMPRESSION/DISCECTOMY FUSION 2 LEVELS
Anesthesia: General | Site: Spine Cervical

## 2017-12-24 MED ORDER — PHENYLEPHRINE 40 MCG/ML (10ML) SYRINGE FOR IV PUSH (FOR BLOOD PRESSURE SUPPORT)
PREFILLED_SYRINGE | INTRAVENOUS | Status: AC
  Filled 2017-12-24: qty 10

## 2017-12-24 MED ORDER — FENTANYL CITRATE (PF) 100 MCG/2ML IJ SOLN
25.0000 ug | INTRAMUSCULAR | Status: DC | PRN
  Administered 2017-12-24 (×2): 50 ug via INTRAVENOUS

## 2017-12-24 MED ORDER — PHENOL 1.4 % MT LIQD: 1.0000 | OROMUCOSAL | Status: DC | PRN

## 2017-12-24 MED ORDER — MENTHOL 3 MG MT LOZG: 1.0000 | LOZENGE | OROMUCOSAL | Status: DC | PRN

## 2017-12-24 MED ORDER — ACETAMINOPHEN 650 MG RE SUPP: 650.0000 mg | RECTAL | Status: DC | PRN

## 2017-12-24 MED ORDER — SENNA 8.6 MG PO TABS
1.0000 | ORAL_TABLET | Freq: Two times a day (BID) | ORAL | Status: DC
  Administered 2017-12-24: 8.6 mg via ORAL
  Filled 2017-12-24: qty 1

## 2017-12-24 MED ORDER — BUPIVACAINE HCL (PF) 0.5 % IJ SOLN
INTRAMUSCULAR | Status: AC
  Filled 2017-12-24: qty 30

## 2017-12-24 MED ORDER — DEXAMETHASONE SODIUM PHOSPHATE 10 MG/ML IJ SOLN
INTRAMUSCULAR | Status: DC | PRN
  Administered 2017-12-24: 10 mg via INTRAVENOUS

## 2017-12-24 MED ORDER — CHLORHEXIDINE GLUCONATE CLOTH 2 % EX PADS: 6.0000 | MEDICATED_PAD | Freq: Once | CUTANEOUS | Status: DC

## 2017-12-24 MED ORDER — ROCURONIUM BROMIDE 100 MG/10ML IV SOLN
INTRAVENOUS | Status: DC | PRN
  Administered 2017-12-24: 20 mg via INTRAVENOUS
  Administered 2017-12-24: 60 mg via INTRAVENOUS

## 2017-12-24 MED ORDER — SUGAMMADEX SODIUM 200 MG/2ML IV SOLN
INTRAVENOUS | Status: DC | PRN
  Administered 2017-12-24: 200 mg via INTRAVENOUS

## 2017-12-24 MED ORDER — ONDANSETRON HCL 4 MG/2ML IJ SOLN
INTRAMUSCULAR | Status: AC
  Filled 2017-12-24: qty 2

## 2017-12-24 MED ORDER — ONDANSETRON HCL 4 MG PO TABS: 4.0000 mg | ORAL_TABLET | Freq: Four times a day (QID) | ORAL | Status: DC | PRN

## 2017-12-24 MED ORDER — SUGAMMADEX SODIUM 200 MG/2ML IV SOLN
INTRAVENOUS | Status: AC
  Filled 2017-12-24: qty 2

## 2017-12-24 MED ORDER — SODIUM CHLORIDE 0.9% FLUSH: 3.0000 mL | Freq: Two times a day (BID) | INTRAVENOUS | Status: DC

## 2017-12-24 MED ORDER — 0.9 % SODIUM CHLORIDE (POUR BTL) OPTIME
TOPICAL | Status: DC | PRN
  Administered 2017-12-24: 1000 mL

## 2017-12-24 MED ORDER — SODIUM CHLORIDE 0.9% FLUSH: 3.0000 mL | INTRAVENOUS | Status: DC | PRN

## 2017-12-24 MED ORDER — DOCUSATE SODIUM 100 MG PO CAPS
100.0000 mg | ORAL_CAPSULE | Freq: Two times a day (BID) | ORAL | Status: DC
  Administered 2017-12-24: 100 mg via ORAL
  Filled 2017-12-24: qty 1

## 2017-12-24 MED ORDER — SODIUM CHLORIDE 0.9 % IV SOLN
INTRAVENOUS | Status: DC | PRN
  Administered 2017-12-24: 500 mL

## 2017-12-24 MED ORDER — DIAZEPAM 5 MG PO TABS
5.0000 mg | ORAL_TABLET | Freq: Four times a day (QID) | ORAL | Status: DC | PRN
  Administered 2017-12-24: 5 mg via ORAL
  Filled 2017-12-24: qty 1

## 2017-12-24 MED ORDER — POLYETHYLENE GLYCOL 3350 17 G PO PACK: 17.0000 g | PACK | Freq: Every day | ORAL | Status: DC | PRN

## 2017-12-24 MED ORDER — DIAZEPAM 5 MG/ML IJ SOLN
INTRAMUSCULAR | Status: AC
  Filled 2017-12-24: qty 2

## 2017-12-24 MED ORDER — DEXAMETHASONE SODIUM PHOSPHATE 10 MG/ML IJ SOLN
INTRAMUSCULAR | Status: AC
  Filled 2017-12-24: qty 1

## 2017-12-24 MED ORDER — CEFAZOLIN SODIUM-DEXTROSE 2-4 GM/100ML-% IV SOLN
2.0000 g | INTRAVENOUS | Status: AC
  Administered 2017-12-24: 2 g via INTRAVENOUS
  Filled 2017-12-24: qty 100

## 2017-12-24 MED ORDER — BUPIVACAINE HCL (PF) 0.5 % IJ SOLN
INTRAMUSCULAR | Status: DC | PRN
  Administered 2017-12-24: 2.5 mL

## 2017-12-24 MED ORDER — ONDANSETRON HCL 4 MG/2ML IJ SOLN
INTRAMUSCULAR | Status: DC | PRN
  Administered 2017-12-24: 4 mg via INTRAVENOUS

## 2017-12-24 MED ORDER — PROMETHAZINE HCL 25 MG/ML IJ SOLN: 6.2500 mg | INTRAMUSCULAR | Status: DC | PRN

## 2017-12-24 MED ORDER — MORPHINE SULFATE (PF) 2 MG/ML IV SOLN
2.0000 mg | INTRAVENOUS | Status: DC | PRN
  Administered 2017-12-24: 2 mg via INTRAVENOUS
  Filled 2017-12-24: qty 1

## 2017-12-24 MED ORDER — SODIUM CHLORIDE 0.9 % IV SOLN: 250.0000 mL | INTRAVENOUS | Status: DC

## 2017-12-24 MED ORDER — LACTATED RINGERS IV SOLN
INTRAVENOUS | Status: DC
  Administered 2017-12-24 (×2): via INTRAVENOUS

## 2017-12-24 MED ORDER — ONDANSETRON HCL 4 MG/2ML IJ SOLN: 4.0000 mg | Freq: Four times a day (QID) | INTRAMUSCULAR | Status: DC | PRN

## 2017-12-24 MED ORDER — CEFAZOLIN SODIUM-DEXTROSE 2-4 GM/100ML-% IV SOLN: 2.0000 g | INTRAVENOUS | Status: DC

## 2017-12-24 MED ORDER — OXYBUTYNIN CHLORIDE ER 5 MG PO TB24
5.0000 mg | ORAL_TABLET | Freq: Every day | ORAL | Status: DC
  Filled 2017-12-24: qty 1

## 2017-12-24 MED ORDER — KETOROLAC TROMETHAMINE 15 MG/ML IJ SOLN
15.0000 mg | Freq: Once | INTRAMUSCULAR | Status: DC
  Administered 2017-12-24: 15 mg via INTRAVENOUS

## 2017-12-24 MED ORDER — CEFAZOLIN SODIUM-DEXTROSE 2-4 GM/100ML-% IV SOLN
2.0000 g | Freq: Three times a day (TID) | INTRAVENOUS | Status: AC
  Administered 2017-12-24 – 2017-12-25 (×2): 2 g via INTRAVENOUS
  Filled 2017-12-24 (×2): qty 100

## 2017-12-24 MED ORDER — FENTANYL CITRATE (PF) 100 MCG/2ML IJ SOLN
INTRAMUSCULAR | Status: AC
  Filled 2017-12-24: qty 2

## 2017-12-24 MED ORDER — TAMSULOSIN HCL 0.4 MG PO CAPS
0.4000 mg | ORAL_CAPSULE | Freq: Two times a day (BID) | ORAL | Status: DC
  Administered 2017-12-24: 0.4 mg via ORAL
  Filled 2017-12-24: qty 1

## 2017-12-24 MED ORDER — PROPOFOL 10 MG/ML IV BOLUS
INTRAVENOUS | Status: AC
  Filled 2017-12-24: qty 20

## 2017-12-24 MED ORDER — SIMVASTATIN 20 MG PO TABS: 40.0000 mg | ORAL_TABLET | Freq: Every day | ORAL | Status: DC

## 2017-12-24 MED ORDER — LIDOCAINE-EPINEPHRINE 1 %-1:100000 IJ SOLN
INTRAMUSCULAR | Status: AC
  Filled 2017-12-24: qty 1

## 2017-12-24 MED ORDER — SUCCINYLCHOLINE CHLORIDE 200 MG/10ML IV SOSY
PREFILLED_SYRINGE | INTRAVENOUS | Status: AC
  Filled 2017-12-24: qty 10

## 2017-12-24 MED ORDER — BISACODYL 10 MG RE SUPP: 10.0000 mg | Freq: Every day | RECTAL | Status: DC | PRN

## 2017-12-24 MED ORDER — ROCURONIUM BROMIDE 10 MG/ML (PF) SYRINGE
PREFILLED_SYRINGE | INTRAVENOUS | Status: AC
  Filled 2017-12-24: qty 10

## 2017-12-24 MED ORDER — KETOROLAC TROMETHAMINE 15 MG/ML IJ SOLN
INTRAMUSCULAR | Status: AC
  Filled 2017-12-24: qty 1

## 2017-12-24 MED ORDER — FENTANYL CITRATE (PF) 100 MCG/2ML IJ SOLN
INTRAMUSCULAR | Status: DC | PRN
  Administered 2017-12-24: 150 ug via INTRAVENOUS
  Administered 2017-12-24 (×2): 50 ug via INTRAVENOUS

## 2017-12-24 MED ORDER — LIDOCAINE-EPINEPHRINE 1 %-1:100000 IJ SOLN
INTRAMUSCULAR | Status: DC | PRN
  Administered 2017-12-24: 2.5 mL

## 2017-12-24 MED ORDER — HYDROCODONE-ACETAMINOPHEN 5-325 MG PO TABS
1.0000 | ORAL_TABLET | ORAL | Status: DC | PRN
  Administered 2017-12-24 – 2017-12-25 (×3): 2 via ORAL
  Filled 2017-12-24 (×3): qty 2

## 2017-12-24 MED ORDER — FENTANYL CITRATE (PF) 250 MCG/5ML IJ SOLN
INTRAMUSCULAR | Status: AC
  Filled 2017-12-24: qty 5

## 2017-12-24 MED ORDER — PANTOPRAZOLE SODIUM 40 MG PO TBEC: 40.0000 mg | DELAYED_RELEASE_TABLET | Freq: Every day | ORAL | Status: DC

## 2017-12-24 MED ORDER — KETOROLAC TROMETHAMINE 15 MG/ML IJ SOLN
7.5000 mg | Freq: Four times a day (QID) | INTRAMUSCULAR | Status: DC
  Administered 2017-12-24 – 2017-12-25 (×2): 7.5 mg via INTRAVENOUS
  Filled 2017-12-24: qty 1

## 2017-12-24 MED ORDER — FLEET ENEMA 7-19 GM/118ML RE ENEM: 1.0000 | ENEMA | Freq: Once | RECTAL | Status: DC | PRN

## 2017-12-24 MED ORDER — LIDOCAINE HCL (CARDIAC) PF 100 MG/5ML IV SOSY
PREFILLED_SYRINGE | INTRAVENOUS | Status: DC | PRN
  Administered 2017-12-24: 60 mg via INTRAVENOUS

## 2017-12-24 MED ORDER — ACETAMINOPHEN 325 MG PO TABS: 650.0000 mg | ORAL_TABLET | ORAL | Status: DC | PRN

## 2017-12-24 MED ORDER — DIAZEPAM 5 MG/ML IJ SOLN
5.0000 mg | Freq: Once | INTRAMUSCULAR | Status: DC
  Administered 2017-12-24: 5 mg via INTRAVENOUS

## 2017-12-24 MED ORDER — PROPOFOL 10 MG/ML IV BOLUS
INTRAVENOUS | Status: DC | PRN
  Administered 2017-12-24: 100 mg via INTRAVENOUS

## 2017-12-24 SURGICAL SUPPLY — 60 items
ADH SKN CLS APL DERMABOND .7 (GAUZE/BANDAGES/DRESSINGS) ×1
ALLOGRAFT LORDOTIC CC 7X11X14 (Bone Implant) ×1 IMPLANT
ALLOGRAFT TRIAD LORDOTIC CC (Bone Implant) ×1 IMPLANT
APL SKNCLS STERI-STRIP NONHPOA (GAUZE/BANDAGES/DRESSINGS) ×1
BAG DECANTER FOR FLEXI CONT (MISCELLANEOUS) ×2 IMPLANT
BENZOIN TINCTURE PRP APPL 2/3 (GAUZE/BANDAGES/DRESSINGS) ×1 IMPLANT
BIT DRILL NEURO 2X3.1 SFT TUCH (MISCELLANEOUS) ×1 IMPLANT
BIT DRILL POWER (BIT) IMPLANT
BNDG GAUZE ELAST 4 BULKY (GAUZE/BANDAGES/DRESSINGS) IMPLANT
BUR BARREL STRAIGHT FLUTE 4.0 (BURR) IMPLANT
CANISTER SUCT 3000ML PPV (MISCELLANEOUS) ×2 IMPLANT
DECANTER SPIKE VIAL GLASS SM (MISCELLANEOUS) ×2 IMPLANT
DERMABOND ADVANCED (GAUZE/BANDAGES/DRESSINGS) ×1
DERMABOND ADVANCED .7 DNX12 (GAUZE/BANDAGES/DRESSINGS) ×1 IMPLANT
DRAPE LAPAROTOMY 100X72 PEDS (DRAPES) ×2 IMPLANT
DRAPE MICROSCOPE LEICA (MISCELLANEOUS) IMPLANT
DRILL BIT POWER (BIT) ×2
DRILL NEURO 2X3.1 SOFT TOUCH (MISCELLANEOUS) ×2
DRSG OPSITE POSTOP 4X6 (GAUZE/BANDAGES/DRESSINGS) ×1 IMPLANT
DURAPREP 6ML APPLICATOR 50/CS (WOUND CARE) ×2 IMPLANT
ELECT REM PT RETURN 9FT ADLT (ELECTROSURGICAL) ×2
ELECTRODE REM PT RTRN 9FT ADLT (ELECTROSURGICAL) ×1 IMPLANT
GAUZE SPONGE 4X4 12PLY STRL (GAUZE/BANDAGES/DRESSINGS) ×2 IMPLANT
GAUZE SPONGE 4X4 16PLY XRAY LF (GAUZE/BANDAGES/DRESSINGS) IMPLANT
GLOVE BIO SURGEON STRL SZ 6.5 (GLOVE) ×1 IMPLANT
GLOVE BIOGEL M STRL SZ7.5 (GLOVE) ×1 IMPLANT
GLOVE BIOGEL PI IND STRL 6.5 (GLOVE) IMPLANT
GLOVE BIOGEL PI IND STRL 7.5 (GLOVE) IMPLANT
GLOVE BIOGEL PI IND STRL 8.5 (GLOVE) ×1 IMPLANT
GLOVE BIOGEL PI INDICATOR 6.5 (GLOVE) ×1
GLOVE BIOGEL PI INDICATOR 7.5 (GLOVE) ×2
GLOVE BIOGEL PI INDICATOR 8.5 (GLOVE) ×1
GLOVE ECLIPSE 7.0 STRL STRAW (GLOVE) ×1 IMPLANT
GLOVE ECLIPSE 8.5 STRL (GLOVE) ×3 IMPLANT
GLOVE SURG SS PI 7.0 STRL IVOR (GLOVE) ×2 IMPLANT
GOWN STRL REUS W/ TWL LRG LVL3 (GOWN DISPOSABLE) IMPLANT
GOWN STRL REUS W/ TWL XL LVL3 (GOWN DISPOSABLE) IMPLANT
GOWN STRL REUS W/TWL 2XL LVL3 (GOWN DISPOSABLE) ×2 IMPLANT
GOWN STRL REUS W/TWL LRG LVL3 (GOWN DISPOSABLE) ×6
GOWN STRL REUS W/TWL XL LVL3 (GOWN DISPOSABLE)
HALTER HD/CHIN CERV TRACTION D (MISCELLANEOUS) ×2 IMPLANT
HEMOSTAT POWDER KIT SURGIFOAM (HEMOSTASIS) ×1 IMPLANT
KIT BASIN OR (CUSTOM PROCEDURE TRAY) ×2 IMPLANT
KIT TURNOVER KIT B (KITS) ×2 IMPLANT
NDL SPNL 22GX3.5 QUINCKE BK (NEEDLE) ×1 IMPLANT
NEEDLE HYPO 22GX1.5 SAFETY (NEEDLE) ×2 IMPLANT
NEEDLE SPNL 22GX3.5 QUINCKE BK (NEEDLE) ×2 IMPLANT
NS IRRIG 1000ML POUR BTL (IV SOLUTION) ×2 IMPLANT
PACK LAMINECTOMY NEURO (CUSTOM PROCEDURE TRAY) ×2 IMPLANT
PAD ARMBOARD 7.5X6 YLW CONV (MISCELLANEOUS) ×6 IMPLANT
PLATE ARCHON 2-LEVEL 40MM (Plate) ×1 IMPLANT
RASP 3.0MM (RASP) ×1 IMPLANT
RUBBERBAND STERILE (MISCELLANEOUS) IMPLANT
SCREW ARCHON ST VAR 4.0X15MM (Screw) ×12 IMPLANT
SCREW BN 15X4XST VA NS SPN (Screw) IMPLANT
SPONGE INTESTINAL PEANUT (DISPOSABLE) ×2 IMPLANT
SUT VIC AB 4-0 RB1 18 (SUTURE) ×4 IMPLANT
TOWEL GREEN STERILE (TOWEL DISPOSABLE) ×2 IMPLANT
TOWEL GREEN STERILE FF (TOWEL DISPOSABLE) ×2 IMPLANT
WATER STERILE IRR 1000ML POUR (IV SOLUTION) ×2 IMPLANT

## 2017-12-24 NOTE — Op Note (Signed)
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  History of fusion C6-7 and C7-T1. Postoperative diagnosis: Same Procedure: Anterior cervical decompression C4-5 and C5-6 arthrodesis with structural allograft and anterior plate fixation using Nuvasive 40 mm plate and 15 mm screws Surgeon: Kristeen Miss First assistant: Consuella Lose MD Anesthesia: General endotracheal Indications: Toriano Aikey is a 72 year old individuals had significant cervical spondylosis with radiculopathy and myelopathy at C4-5 and C5-6 for a number of years.  Is failed efforts at conservative management and ultimately I advised decompression and arthroplasty however arthroplasty was not allowed per his insurance.  He is now undergoing two-level anterior decompression and arthrodesis.  Procedure: The patient was brought to the operating room supine on the stretcher.  After the smooth induction of general endotracheal anesthesia he was placed onto the operating table and his head was placed in 5 pounds of halter traction.  The neck was prepped with alcohol DuraPrep and draped in a sterile fashion.  Transverse incision was created on the left side of neck and the incision was carried down through the platysma.  Plane between the sternocleidomastoid and strap muscles was dissected bluntly into the prevertebral space was reached.  Then the first identifiable disc space was noted to be that of C4-5 and the dissection was carried inferiorly to expose C5-6 and the top of the plate at C6.  The disc spaces were isolated and then entered.  Significant quantities of severely degenerated and desiccated disc material were removed until the region of the posterior longitudinal ligament was reached.  Self-retaining discredit was placed in the wound and this allowed better visualization of the ligament and some calcified material within it the ligament was opened with a 2 mm  Kerrison punch and the dissection was carried across from one at side of the midline to the other side the uncinate processes were taken up on either side to allow good decompression into the foramen.  Once the foramen and the central canal were decompressed hemostasis was established and at C6-C7 a 7 mm lordotic cortical allograft was placed into the interspace.  Attention was then turned to C4-5 where a similar decompression was undertaken and here a 6 mm tall lordotic allograft was placed into the interspace.  Then a 40 mm anterior invasive plate was fixed to the vertebral bodies with a 6 variable angle 15 mm long screws.  Final radiographic confirmation was obtained to verify good positioning of the hardware the screws were locked into position the plate was locked and the retractors were removed hemostasis in the soft tissues was checked and then the platysma was closed with 3-0 Vicryl in interrupted fashion and 4-0 Vicryl was used in the subarticular tissues.  Dermabond was placed on the skin.  Patient tolerated procedure well blood loss is estimated at less than 100 cc for the entire procedure.

## 2017-12-24 NOTE — H&P (Signed)
Casey Castillo is an 72 y.o. male.   Chief Complaint: Neck and bilateral arm pain HPI: Casey Castillo is a 72 year old individual who is had significant neck shoulder and bilateral arm pain.  He has had previous decompression and arthrodesis at C7-T1 and C6-C7 he has had symptoms for over a years period of time and is failed extensive efforts at conservative management.  I advised decompression and arthroplasty at C4-5 and C5-6 to relieve him of spinal cord compression and by foraminal stenosis at each of these levels however his insurance company declined to provide this service for him indicating that they would only allow a two-level arthrodesis.  The patient is now being admitted for two-level decompression and arthrodesis at C4-5 and C5-6.  I do not feel that this is in the patient's best interest but is the best possible choice form if his insurance company is declining to provide the service I recommended.  Past Medical History:  Diagnosis Date  . Arthritis   . Cervical myelopathy with cervical radiculopathy   . Coronary artery disease    5%blockage on left anterior descending Dr. Sallyanne Kuster   . Diverticulitis    hx of  . Enlarged prostate    takes flomax  . GERD (gastroesophageal reflux disease)   . History of blood clots    november, 5 1995  . Hyperlipidemia    primary Dr. Selena Batten  . Kidney stone    hx of sees Dr. Chauncey Cruel. [place] [facility]  . Pneumonia    as a child  . PTSD (post-traumatic stress disorder)   . Seasonal allergies   . Urinary frequency    hx of     Past Surgical History:  Procedure Laterality Date  . ANTERIOR CERVICAL DECOMP/DISCECTOMY FUSION  01/28/2012   Procedure: ANTERIOR CERVICAL DECOMPRESSION/DISCECTOMY FUSION 2 LEVELS;  Surgeon: Kristeen Miss, MD;  Location: [place] [facility];  Service: Neurosurgery;  Laterality: N/A;  Cervical six-seven, cervical seven-thoracic one Anterior cervical decompression/diskectomy/fusion  . CARDIAC CATHETERIZATION     09-06-11   .  CARDIOVASCULAR STRESS TEST     2006 and 2008 at [facility] vascular, 2011 myoview and echo  . COLONOSCOPY    . COLONOSCOPY N/A 08/30/2015   Procedure: COLONOSCOPY;  Surgeon: Aviva Signs, MD;  [facility];  Service: Gastroenterology;  Laterality: N/A;  . EYE SURGERY     Laser for torn retina  . HEMORRHOID SURGERY     sept 8, 2008  . KIDNEY STONE SURGERY     lithotripsy 1980's  . KNEE SURGERY     left 04-11-94 ( has had two surgeries to left knee)  . NASAL SINUS SURGERY     Jun 30 1998  . NM MYOVIEW LTD  07/20/2009   no ischemia  . PILONIDAL CYST / SINUS EXCISION     surgery 01-08-74  . SHOULDER SURGERY     right 12-20-2003  . TRIGGER FINGER RELEASE     03-21-98 right hand ring finger  . US ECHOCARDIOGRAPHY  07/20/2009   LA & RA mildly dilated,trace MR,TR.    Family History  Problem Relation Age of Onset  . Heart attack Father   . Dementia Mother   . Heart disease Other    Social History:  reports that he has quit smoking. His smoking use included cigarettes. He has a 20.00 pack-year smoking history. He has never used smokeless tobacco. He reports that he does not drink alcohol or use drugs.  Allergies:  Allergies  Allergen  Reactions  . Adhesive [Tape] Hives, Rash and Other (See Comments)    EKG electrodes cause whelps.    No medications prior to admission.    No results found for this or any previous visit (from the past 48 hour(s)). No results found.  Review of Systems  Constitutional: Negative.   HENT: Negative.   Eyes: Negative.   Respiratory: Negative.   Gastrointestinal: Negative.   Genitourinary: Negative.   Musculoskeletal: Positive for neck pain.  Skin: Negative.   Neurological: Positive for sensory change, focal weakness and weakness.  Endo/Heme/Allergies: Negative.     There were no vitals taken for this visit. Physical Exam  Constitutional: He is oriented to person, place, and time. He appears well-developed and well-nourished.   Eyes: Pupils are equal, round, and reactive to light. Conjunctivae and EOM are normal.  Neck:  Decreased range of motion turning 45 degrees left and right flexing and extending 80% of normal.  Cardiovascular: Normal rate and regular rhythm.  Respiratory: Effort normal and breath sounds normal.  GI: Soft. Bowel sounds are normal.  Musculoskeletal:  Positive Spurling sign on the right side.  Neurological: He is alert and oriented to person, place, and time.  Modest weakness in the deltoids and biceps at 4 out of 5 bilaterally deep tendon reflexes absent in the brachioradialis and the bicep tricep reflexes intact at 1+.  Lower extremity reflexes are intact Station and gait are intact Romberg's test is negative.  Skin: Skin is warm and dry.  Psychiatric: He has a normal mood and affect. His behavior is normal. Judgment and thought content normal.     Assessment/Plan Spondylosis with myelopathy and radiculopathy C4-5 and C5-6  Plan: Anterior cervical decompression C4-5 C5-6 arthrodesis with structural allograft anterior plate fixation M5-H8.  Earleen Newport, MD 12/24/2017, 1:39 PM

## 2017-12-24 NOTE — Anesthesia Procedure Notes (Signed)
Procedure Name: Intubation Date/Time: 12/24/2017 2:52 PM Performed by: Shirlyn Goltz, CRNA Pre-anesthesia Checklist: Patient identified, Emergency Drugs available, Patient being monitored and Suction available Patient Re-evaluated:Patient Re-evaluated prior to induction Oxygen Delivery Method: Circle system utilized Preoxygenation: Pre-oxygenation with 100% oxygen Induction Type: IV induction Ventilation: Mask ventilation without difficulty Laryngoscope Size: Glidescope and 4 Grade View: Grade I Tube type: Oral Tube size: 7.5 mm Number of attempts: 1 Airway Equipment and Method: Video-laryngoscopy and Rigid stylet Placement Confirmation: ETT inserted through vocal cords under direct vision,  positive ETCO2 and breath sounds checked- equal and bilateral Secured at: 23 cm Tube secured with: Tape Dental Injury: Teeth and Oropharynx as per pre-operative assessment

## 2017-12-24 NOTE — Transfer of Care (Signed)
Immediate Anesthesia Transfer of Care Note  Patient: Casey Castillo  Procedure(s) Performed: ANTERIOR CERVICAL DECOMPRESSION/DISCECTOMY FUSION CERVICAL FOUR-FIVE/CERVICAL FIVE-SIX TWO LEVELS (N/A Spine Cervical)  Patient Location: PACU  Anesthesia Type:General  Level of Consciousness: awake, alert  and oriented  Airway & Oxygen Therapy: Patient Spontanous Breathing and Patient connected to nasal cannula oxygen  Post-op Assessment: Report given to RN and Post -op Vital signs reviewed and stable  Post vital signs: Reviewed and stable  Last Vitals:  Vitals Value Taken Time  BP    Temp    Pulse    Resp 17 12/24/2017  5:00 PM  SpO2    Vitals shown include unvalidated device data.  Last Pain:  Vitals:   12/24/17 1359  TempSrc: Oral         Complications: No apparent anesthesia complications

## 2017-12-24 NOTE — Anesthesia Preprocedure Evaluation (Signed)
Anesthesia Evaluation  Patient identified by MRN, date of birth, ID band Patient awake    Reviewed: Allergy & Precautions, NPO status , Patient's Chart, lab work & pertinent test results  History of Anesthesia Complications Negative for: history of anesthetic complications  Airway Mallampati: I  TM Distance: >3 FB Neck ROM: Full    Dental  (+) Teeth Intact   Pulmonary neg shortness of breath, neg sleep apnea, neg COPD, neg recent URI, former smoker,    breath sounds clear to auscultation       Cardiovascular + CAD   Rhythm:Regular     Neuro/Psych PSYCHIATRIC DISORDERS Anxiety    GI/Hepatic Neg liver ROS, GERD  Medicated and Controlled,  Endo/Other  Morbid obesity  Renal/GU Renal disease     Musculoskeletal  (+) Arthritis ,   Abdominal   Peds  Hematology   Anesthesia Other Findings  Addendum 12/23/17:   - Saw cardiologist Mihai Croitoru, MD 12/23/17 and was cleared for surgery at low risk   DISCUSSION: - Pt is a 72 year old male with hx CAD (by notes, minimal LAD irregularities on remote cath 2006), DVT  - Pt has appt with cardiology for surgical clearance 12/23/17.    VS: BP 121/73   Pulse 73   Temp 36.6 C   Resp 20   Ht 5\' 10"  (1.778 m)   Wt 224 lb 1.6 oz (101.7 kg)   SpO2 98%   BMI 32.16 kg/m   PROVIDERS: - PCP is Redmond School, MD - Cardiologist is Sanda Klein, MD.   LABS: Labs reviewed: Acceptable for surgery. (all labs ordered are listed, but only abnormal results are displayed)  Labs Reviewed BASIC METABOLIC PANEL - Abnormal; Notable for the following components:     Result Value   Glucose, Bld 124 (*)   All other components within normal limits SURGICAL PCR SCREEN CBC TYPE AND SCREEN   EKG 12/19/17: Sinus bradycardia (59 bpm) with 1st degree A-V block. Minimal voltage criteria for LVH, may be normal variant   CV:  Nuclear stress test 08/24/16:  1.  Heart  rate response to exercise appropriate. 2.  BP response to exercise: Normal resting BP, appropriate response. 3.  No chest pain.  No arrhythmia. 4.  ST changes: Depression upsloping. 5.  Overall impression: Equivocal stress test. - Dr. Sallyanne Kuster documents "on my review of the tracings I think it's a normal test"   Carotid duplex 08/17/13:  1. Mild bilateral carotid atherosclerotic vascular disease. No hemodynamically significant stenosis. 2. Vertebrals are patent with antegrade flow.  Echo 01/22/13:  - Left ventricle: The cavity size was normal. There was mildconcentric hypertrophy. Systolic function was normal. Theestimated ejection fraction was in the range of 55% to60%. Wall motion was normal; there were no regional wallmotion abnormalities. Left ventricular diastolic functionparameters were normal. - Atrial septum: No defect or patent foramen ovale was identified.   Past Medical History: Diagnosis Date . Arthritis  . Cervical myelopathy with cervical radiculopathy  . Coronary artery disease   5%blockage on left anterior descending Dr. Sallyanne Kuster  . Diverticulitis   hx of . Enlarged prostate   takes flomax . GERD (gastroesophageal reflux disease)  . History of blood clots   november, 5 1995 . Hyperlipidemia   primary Dr. Selena Batten . Kidney stone   hx of sees Dr. Chauncey Cruel. [place] [facility] . Pneumonia   as a child . PTSD (post-traumatic stress disorder)  . Seasonal allergies  . Urinary frequency   hx of  Past Surgical History: Procedure Laterality Date . ANTERIOR CERVICAL DECOMP/DISCECTOMY FUSION  01/28/2012  Procedure: ANTERIOR CERVICAL DECOMPRESSION/DISCECTOMY FUSION 2 LEVELS;  Surgeon: Kristeen Miss, MD;  Location: [facility];  Service: Neurosurgery;  Laterality: N/A;  Cervical six-seven, cervical seven-thoracic one Anterior cervical decompression/diskectomy/fusion . CARDIAC CATHETERIZATION    09-06-11  . CARDIOVASCULAR STRESS TEST    2006  and 2008 at [facility], 2011 myoview and echo . COLONOSCOPY   . COLONOSCOPY N/A 08/30/2015  Procedure: COLONOSCOPY;  Surgeon: Aviva Signs, MD;  Location: [facility];  Service: Gastroenterology;  Laterality: N/A; . EYE SURGERY    Laser for torn retina . HEMORRHOID SURGERY    sept 8, 2008 . KIDNEY STONE SURGERY    lithotripsy 1980's . KNEE SURGERY    left 04-11-94 ( has had two surgeries to left knee) . NASAL SINUS SURGERY    Jun 30 1998 . NM MYOVIEW LTD  07/20/2009  no ischemia . PILONIDAL CYST / SINUS EXCISION    surgery 01-08-74 . SHOULDER SURGERY    right 12-20-2003 . TRIGGER FINGER RELEASE    03-21-98 right hand ring finger . US ECHOCARDIOGRAPHY  07/20/2009  LA & RA mildly dilated,trace MR,TR.   MEDICATIONS: . acetaminophen (TYLENOL) 650 MG CR tablet . aspirin EC 81 MG tablet . Biotin 5 MG TBDP . calcium elemental as carbonate (TUMS ULTRA 1000) 400 MG chewable tablet . Cholecalciferol (VITAMIN D) 2000 UNITS CAPS . clobetasol cream (TEMOVATE) 0.05 % . diphenhydrAMINE (BENADRYL) 50 MG tablet . loratadine (CLARITIN) 10 MG tablet . Lysine HCl 500 MG TABS . Melatonin 5 MG TABS . naproxen sodium (ALEVE) 220 MG tablet . Omega-3 Fatty Acids (FISH OIL) 1200 MG CAPS . oxybutynin (DITROPAN-XL) 5 MG 24 hr tablet . pantoprazole (PROTONIX) 40 MG tablet . simvastatin (ZOCOR) 40 MG tablet . tadalafil (CIALIS) 5 MG tablet . Tamsulosin HCl (FLOMAX) 0.4 MG CAPS . Tetrahydrozoline HCl (VISINE OP)  No current facility-administered medications for this encounter.    If no changes, I anticipate pt can proceed with surgery as scheduled.      Reproductive/Obstetrics                             Anesthesia Physical Anesthesia Plan  ASA: II  Anesthesia Plan: General   Post-op Pain Management:    Induction: Intravenous  PONV Risk Score and Plan: 2 and Ondansetron and Dexamethasone  Airway Management  Planned: Oral ETT  Additional Equipment: None  Intra-op Plan:   Post-operative Plan: Extubation in OR  Informed Consent: I have reviewed the patients History and Physical, chart, labs and discussed the procedure including the risks, benefits and alternatives for the proposed anesthesia with the patient or authorized representative who has indicated his/her understanding and acceptance.   Dental advisory given  Plan Discussed with: CRNA and Surgeon  Anesthesia Plan Comments:         Anesthesia Quick Evaluation

## 2017-12-24 NOTE — Progress Notes (Signed)
Patient ID: Casey Castillo, male   DOB: 02/11/1946, 72 y.o.   MRN: [id number] Vital signs stable Complains of some mild shoulder pain Motor function is good no dysesthesias in the upper extremities.  Stable postop

## 2017-12-25 ENCOUNTER — Encounter (HOSPITAL_COMMUNITY): Payer: Self-pay | Admitting: Neurological Surgery

## 2017-12-25 DIAGNOSIS — M4722 Other spondylosis with radiculopathy, cervical region: Secondary | ICD-10-CM | POA: Diagnosis not present

## 2017-12-25 MED ORDER — HYDROCODONE-ACETAMINOPHEN 5-325 MG PO TABS: 1.0000 | ORAL_TABLET | ORAL | 0 refills | Status: DC | PRN

## 2017-12-25 MED ORDER — DIAZEPAM 5 MG PO TABS: 5.0000 mg | ORAL_TABLET | Freq: Four times a day (QID) | ORAL | 0 refills | Status: DC | PRN

## 2017-12-25 NOTE — Discharge Summary (Signed)
Physician Discharge Summary  Patient ID: Casey Castillo MRN: [id number] DOB/AGE: 1945-11-06 72 y.o.  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  He tolerated surgery well  Consults: None  Significant Diagnostic Studies: None  Treatments: surgery: Anterior cervical decompression C5-6 and C4-5 arthrodesis C4-5 and C5-6.  Discharge Exam: Blood pressure 127/75, pulse 63, temperature 97.8 F (36.6 C), temperature source Oral, resp. rate 18, height 5\' 10"  (1.778 m), weight 98.9 kg (218 lb), SpO2 95 %. Incision is clean and dry Station and gait are intact left upper extremity intrinsic weakness in grip strength is and remains diminished.  Disposition: Discharge disposition: 01-Home or Self Care       Discharge Instructions    Call MD for:  redness, tenderness, or signs of infection (pain, swelling, redness, odor or green/yellow discharge around incision site)   Complete by:  As directed    Call MD for:  severe uncontrolled pain   Complete by:  As directed    Call MD for:  temperature >100.4   Complete by:  As directed    Diet - low sodium heart healthy   Complete by:  As directed    Incentive spirometry RT   Complete by:  As directed    Increase activity slowly   Complete by:  As directed      Allergies as of 12/25/2017      Reactions   Adhesive [tape] Hives, Rash, Other (See Comments)   EKG electrodes cause whelps.      Medication List    TAKE these medications   acetaminophen 650 MG CR tablet Commonly known as:   TYLENOL Take 1,300 mg by mouth daily as needed for pain.   aspirin EC 81 MG tablet Take 81 mg by mouth daily.   Biotin 5 MG Tbdp Take 5 mg by mouth daily.   clobetasol cream 0.05 % Commonly known as:  TEMOVATE Apply 1 application topically 2 (two) times daily as needed (rash).   diazepam 5 MG tablet Commonly known as:  VALIUM Take 1 tablet (5 mg total) by mouth every 6 (six) hours as needed for muscle spasms.   diphenhydrAMINE 50 MG tablet Commonly known as:  BENADRYL Take 50 mg by mouth at bedtime as needed for sleep.   Fish Oil 1200 MG Caps Take 1,200 mg by mouth daily.   HYDROcodone-acetaminophen 5-325 MG tablet Commonly known as:  NORCO/VICODIN Take 1-2 tablets by mouth every 4 (four) hours as needed for severe pain ((score 7 to 10)).   loratadine 10 MG tablet Commonly known as:  CLARITIN Take 10 mg by mouth daily.   Lysine HCl 500 MG Tabs Take 500 mg by mouth daily.   Melatonin 5 MG Tabs Take 5 mg by mouth at bedtime as needed (sleep).   naproxen sodium 220 MG tablet Commonly known as:  ALEVE Take 220 mg by mouth daily as needed (pain).   oxybutynin 5 MG 24 hr tablet Commonly known as:  DITROPAN-XL Take 2.5 mg by mouth daily.   pantoprazole 40 MG tablet Commonly known as:  PROTONIX Take 40 mg by mouth daily.   simvastatin 40 MG  tablet Commonly known as:  ZOCOR Take 1 tablet (40 mg total) by mouth daily.   tadalafil 5 MG tablet Commonly known as:  CIALIS Take 5 mg by mouth daily.   tamsulosin 0.4 MG Caps capsule Commonly known as:  FLOMAX Take 0.4 mg by mouth 2 (two) times daily.   TUMS ULTRA 1000 400 MG chewable tablet Generic drug:  calcium elemental as carbonate Chew 2,000-3,000 mg by mouth daily as needed for heartburn.   Vitamin D 2000 units Caps Take 2,000 Units by mouth daily.        SignedEarleen Newport 12/25/2017, 8:33 AM

## 2017-12-25 NOTE — Evaluation (Signed)
Physical Therapy Evaluation and Discharge Patient Details Name: Casey Castillo MRN: [id number] DOB: [birth year] Today's Date: 12/25/2017   History of Present Illness  72 yo male presenting with neck pain and decreased grasp at left hand. S/p Anterior cervical decompression C5-6 and C4-5 arthrodesis C4-5 and C5-6 on 12/24/17. PMH including CAD, arthritis, kidney stones, PTSD, and GERD.  Clinical Impression  Patient evaluated by Physical Therapy with no further acute PT needs identified. All education has been completed and the patient has no further questions. At the time of PT eval pt was able to perform transfers and ambulation with gross independence to modified independence. Pt was educated on precautions, stair negotiation, car transfer, and general safety with mobility progression. See below for any follow-up Physical Therapy or equipment needs. PT is signing off. Thank you for this referral.     Follow Up Recommendations No PT follow up;Supervision - Intermittent    Equipment Recommendations  None recommended by PT    Recommendations for Other Services       Precautions / Restrictions Precautions Precautions: Cervical Precaution Booklet Issued: Yes (comment) Precaution Comments: Reviewed all cervical precautions and adherance during functional mobility.  Required Braces or Orthoses: Other Brace/Splint Other Brace/Splint: "No brace needed" per MD order Restrictions Weight Bearing Restrictions: No      Mobility  Bed Mobility               General bed mobility comments: Pt ambulating upon arrival.   Transfers Overall transfer level: Independent                  Ambulation/Gait Ambulation/Gait assistance: Modified independent (Device/Increase time) Gait Distance (Feet): 500 Feet Assistive device: None Gait Pattern/deviations: Step-through pattern;Decreased stride length Gait velocity: Slightly decreased Gait velocity interpretation: 1.31 - 2.62 ft/sec,  indicative of limited community ambulator General Gait Details: VC's for improved posture. Overall ambulating well   Stairs Stairs: Yes Stairs assistance: Modified independent (Device/Increase time) Stair Management: One rail Right;Alternating pattern;Forwards Number of Stairs: 10 General stair comments: VC's for general safety. No unsteadiness noted.   Wheelchair Mobility    Modified Rankin (Stroke Patients Only)       Balance Overall balance assessment: No apparent balance deficits (not formally assessed)                                           Pertinent Vitals/Pain Pain Assessment: Faces Faces Pain Scale: Hurts little more Pain Location: Neck Pain Descriptors / Indicators: Constant;Discomfort Pain Intervention(s): Monitored during session    Home Living Family/patient expects to be discharged to:: Private residence Living Arrangements: Alone Available Help at Discharge: Friend(s);Available 24 hours/day(Girlfriend staying at dc for initial support) Type of Home: House Home Access: Stairs to enter Entrance Stairs-Rails: Right Entrance Stairs-Number of Steps: 4 Home Layout: One level Home Equipment: Shower seat - built in;Grab bars - toilet;Grab bars - tub/shower;Hand held shower head      Prior Function Level of Independence: Independent         Comments: ADLs, IADLs, works as a Dealer, and enjoys IT sales professional.      Hand Dominance   Dominant Hand: Left    Extremity/Trunk Assessment   Upper Extremity Assessment Upper Extremity Assessment: Defer to OT evaluation LUE Deficits / Details: Dominant hand. Decreased ROM at digits and hand. Decreased grasp and pinch strength. Poor index finger flexion compared to other  digits. Pt using compensatory wrist extension to increase grasp strength with tenodesis grasp LUE Coordination: decreased fine motor    Lower Extremity Assessment Lower Extremity Assessment: Overall WFL for tasks  assessed    Cervical / Trunk Assessment Cervical / Trunk Assessment: Other exceptions Cervical / Trunk Exceptions: S/p Anterior cervical decompression C5-6 and C4-5 arthrodesis C4-5 and C5-6  Communication   Communication: No difficulties  Cognition Arousal/Alertness: Awake/alert Behavior During Therapy: WFL for tasks assessed/performed Overall Cognitive Status: Within Functional Limits for tasks assessed                                 General Comments: Pt highly motivated      General Comments General comments (skin integrity, edema, etc.): Pt verbalizing main concern with using left hand functionally.     Exercises Hand Exercises Digit Composite Flexion: AROM;Left;10 reps;Seated Composite Extension: AROM;Left;10 reps;Seated Digit Composite Adduction: AROM;Left;10 reps;Seated(Theraputty between digit 2 and 3) Thumb Abduction: AROM;Left;10 reps;Seated Thumb Adduction: AROM;Left;10 reps;Seated Opposition: AROM;Left;5 reps;Seated   Assessment/Plan    PT Assessment Patent does not need any further PT services  PT Problem List         PT Treatment Interventions      PT Goals (Current goals can be found in the Care Plan section)  Acute Rehab PT Goals Patient Stated Goal: Return to playing golf PT Goal Formulation: All assessment and education complete, DC therapy    Frequency     Barriers to discharge        Co-evaluation               AM-PAC PT "6 Clicks" Daily Activity  Outcome Measure Difficulty turning over in bed (including adjusting bedclothes, sheets and blankets)?: None Difficulty moving from lying on back to sitting on the side of the bed? : None Difficulty sitting down on and standing up from a chair with arms (e.g., wheelchair, bedside commode, etc,.)?: None Help needed moving to and from a bed to chair (including a wheelchair)?: None Help needed walking in hospital room?: None Help needed climbing 3-5 steps with a railing? : None 6  Click Score: 24    End of Session   Activity Tolerance: Patient tolerated treatment well Patient left: Other (comment)(Ambulated with therapy down to entrance for d/c) Nurse Communication: Mobility status PT Visit Diagnosis: Pain;Other symptoms and signs involving the nervous system (R29.898) Pain - part of body: (neck)    Time: 2706-2376 PT Time Calculation (min) (ACUTE ONLY): 10 min   Charges:   PT Evaluation $PT Eval Low Complexity: 1 Low     PT G Codes:        Rolinda Roan, PT, DPT Acute Rehabilitation Services Pager: [phone]   Thelma Comp 12/25/2017, 9:47 AM

## 2017-12-25 NOTE — Evaluation (Signed)
Occupational Therapy Evaluation Patient Details Name: Casey Castillo MRN: [id number] DOB: [birth year] Today's Date: 12/25/2017    History of Present Illness 72 yo male presenting with neck pain and decreased grasp at left hand. S/p Anterior cervical decompression C5-6 and C4-5 arthrodesis C4-5 and C5-6 on 12/24/17. PMH including CAD, arthritis, kidney stones, PTSD, and GERD.   Clinical Impression   PTA, pt was living alone and was independent; pt's significant other plans on staying at dc for initial support. Currently, pt performing ADLs and functional mobility at supervision level. Pt using compensatory techniques for ADLs since injury impacting his LUE (dominant hand) and pt continuing to use adaptive techniques for dressing, eating, and other ADLs. Provided education on cervical precautions, UB ADLs, LB ADLs, grooming, and self feeding for adherence to cervical precautions; pt demonstrated understanding. Providing pt with theraputty (super soft tan) and exercises for left hand and digits. Issued built up handles to increase independence and incorporation of LUE into functional tasks. Recommend dc home with follow up therapy at outpatient to optimize functional return of left hand and increase independence. All acute OT needs met and will sign off. Thank you.     Follow Up Recommendations  Outpatient OT;Supervision - Intermittent    Equipment Recommendations  None recommended by OT    Recommendations for Other Services       Precautions / Restrictions Precautions Precautions: Cervical Precaution Booklet Issued: Yes (comment) Precaution Comments: Reviewed all cervical precautions and adherance dueing ADLs Required Braces or Orthoses: Other Brace/Splint Other Brace/Splint: "No brace needed" per MD order Restrictions Weight Bearing Restrictions: No      Mobility Bed Mobility               General bed mobility comments: OOB upon arrival  Transfers Overall transfer level:  Independent                    Balance Overall balance assessment: No apparent balance deficits (not formally assessed)                                         ADL either performed or assessed with clinical judgement   ADL Overall ADL's : Needs assistance/impaired Eating/Feeding: Set up;Supervision/ safety;Sitting Eating/Feeding Details (indicate cue type and reason): Pt has been using RUe for self feeding since injury. Provided pt with built up handles to incorporate LUE into self feeding tasks. Educating pt on use of built up handles so he can adapt them to other tasks. Pt very motivated and creative with adaptive techniques.                                   General ADL Comments: Pt performing ADLs and functional mobility at supervision level. Providing pt with education on cervical precautions and compensatory techniques for adherance to precautions. Pt demosntrating and verbalized understanding. Pt continues to present with decreased grasp strnegth in left hand (domiannt hand) and uses adaptive techniques throughout dressing.      Vision         Perception     Praxis      Pertinent Vitals/Pain Pain Assessment: Faces Faces Pain Scale: Hurts little more Pain Location: Neck Pain Descriptors / Indicators: Constant;Discomfort Pain Intervention(s): Monitored during session;Repositioned     Hand Dominance Left   Extremity/Trunk Assessment Upper Extremity Assessment  Upper Extremity Assessment: LUE deficits/detail LUE Deficits / Details: Dominant hand. Decreased ROM at digits and hand. Decreased grasp and pinch strength. Poor index finger flexion compared to other digits. Pt using compensatory wrist extension to increase grasp strength with tenodesis grasp LUE Coordination: decreased fine motor   Lower Extremity Assessment Lower Extremity Assessment: Overall WFL for tasks assessed   Cervical / Trunk Assessment Cervical / Trunk  Assessment: Other exceptions Cervical / Trunk Exceptions: s/p    Communication Communication Communication: No difficulties   Cognition Arousal/Alertness: Awake/alert Behavior During Therapy: WFL for tasks assessed/performed Overall Cognitive Status: Within Functional Limits for tasks assessed                                 General Comments: Pt highly motivated   General Comments  Pt verbalizing main concern with using left hand functionally.     Exercises Exercises: Hand exercises Hand Exercises Digit Composite Flexion: AROM;Left;10 reps;Seated Composite Extension: AROM;Left;10 reps;Seated Digit Composite Adduction: AROM;Left;10 reps;Seated(Theraputty between digit 2 and 3) Thumb Abduction: AROM;Left;10 reps;Seated Thumb Adduction: AROM;Left;10 reps;Seated Opposition: AROM;Left;5 reps;Seated   Shoulder Instructions      Home Living Family/patient expects to be discharged to:: Private residence Living Arrangements: Alone Available Help at Discharge: Friend(s);Available 24 hours/day(Girlfriend staying at dc for initial support) Type of Home: House Home Access: Stairs to enter [employer] of Steps: 4 Entrance Stairs-Rails: Right Home Layout: One level     Bathroom Shower/Tub: Occupational psychologist: Standard     Home Equipment: Shower seat - built in;Grab bars - toilet;Grab bars - tub/shower;Hand held shower head          Prior Functioning/Environment Level of Independence: Independent        Comments: ADLs, IADLs, works as a Dealer, and enjoys IT sales professional.         OT Problem List: Decreased strength;Decreased range of motion;Decreased activity tolerance;Decreased knowledge of precautions;Impaired UE functional use;Pain      OT Treatment/Interventions:      OT Goals(Current goals can be found in the care plan section) Acute Rehab OT Goals Patient Stated Goal: Return to playing golf OT Goal Formulation: All  assessment and education complete, DC therapy  OT Frequency:     Barriers to D/C:            Co-evaluation              AM-PAC PT "6 Clicks" Daily Activity     Outcome Measure Help from another person eating meals?: A Little Help from another person taking care of personal grooming?: A Little Help from another person toileting, which includes using toliet, bedpan, or urinal?: None Help from another person bathing (including washing, rinsing, drying)?: None Help from another person to put on and taking off regular upper body clothing?: None Help from another person to put on and taking off regular lower body clothing?: None 6 Click Score: 22   End of Session Nurse Communication: Mobility status  Activity Tolerance: Patient tolerated treatment well Patient left: in chair;with call bell/phone within reach  OT Visit Diagnosis: Unsteadiness on feet (R26.81);Other abnormalities of gait and mobility (R26.89);Pain;Muscle weakness (generalized) (M62.81) Pain - part of body: (Neck)                Time: 5916-3846 OT Time Calculation (min): 44 min Charges:  OT General Charges $OT Visit: 1 Visit OT Evaluation $OT Eval Moderate Complexity: 1  Mod OT Treatments $Self Care/Home Management : 8-22 mins G-Codes:     Jaclin Finks MSOT, OTR/L Acute Rehab Pager: [phone] Office: [place] 12/25/2017, 9:24 AM

## 2017-12-28 NOTE — Anesthesia Postprocedure Evaluation (Signed)
Anesthesia Post Note  Patient: WINNER VALERIANO  Procedure(s) Performed: ANTERIOR CERVICAL DECOMPRESSION/DISCECTOMY FUSION CERVICAL FOUR-FIVE/CERVICAL FIVE-SIX TWO LEVELS (N/A Spine Cervical)     Patient location during evaluation: PACU Anesthesia Type: General Level of consciousness: awake and alert Pain management: pain level controlled Vital Signs Assessment: post-procedure vital signs reviewed and stable Respiratory status: spontaneous breathing, nonlabored ventilation, respiratory function stable and patient connected to nasal cannula oxygen Cardiovascular status: blood pressure returned to baseline and stable Postop Assessment: no apparent nausea or vomiting Anesthetic complications: no    Last Vitals:  Vitals:   12/25/17 0417 12/25/17 0736  BP: 110/66 127/75  Pulse: 63 63  Resp: 15 18  Temp: 36.4 C 36.6 C  SpO2: 93% 95%    Last Pain:  Vitals:   12/25/17 0736  TempSrc: Oral  PainSc:                  Kendel Pesnell

## 2017-12-30 DIAGNOSIS — F4312 Post-traumatic stress disorder, chronic: Secondary | ICD-10-CM | POA: Diagnosis not present

## 2017-12-31 ENCOUNTER — Other Ambulatory Visit: Payer: Self-pay

## 2017-12-31 ENCOUNTER — Ambulatory Visit (HOSPITAL_COMMUNITY): Payer: PPO | Attending: Neurological Surgery | Admitting: Occupational Therapy

## 2017-12-31 ENCOUNTER — Encounter (HOSPITAL_COMMUNITY): Payer: Self-pay | Admitting: Occupational Therapy

## 2017-12-31 DIAGNOSIS — R278 Other lack of coordination: Secondary | ICD-10-CM | POA: Diagnosis not present

## 2017-12-31 DIAGNOSIS — R29898 Other symptoms and signs involving the musculoskeletal system: Secondary | ICD-10-CM | POA: Diagnosis not present

## 2017-12-31 NOTE — Patient Instructions (Signed)
Home Exercises Program Theraputty Exercises  Do the following exercises 2 times a day using your affected hand.  1. Roll putty into a ball.  2. Make into a pancake.  3. Roll putty into a roll.  4. Pinch along log with first finger and thumb.   5. Make into a ball.  6. Roll it back into a log.   7. Pinch using thumb and side of first finger.  8. Roll into a ball, then flatten into a pancake.  9. Using your fingers, make putty into a mountain.    1) Towel crunch Place a small towel on a firm table top. Flatten out the towel and then place your hand on one end of it.  Next, flex your fingers 2-5 (index finger through pinky finger) as you pull the towel towards your hand.    2) Digit composite flexion/adduction (make a fist) Hold your hand up as shown. Open and close your hand into a fist and repeat. If you cannot make a full fist, then make a partial fist.    3) Thumb/finger opposition Touch the tip of the thumb to each fingertip one by one. Extend fingers fully after they are touched.      4) Finger Taps Start with the hand flat and fingers slightly spread.  One at a time, starting with the thumb, lift each finger up separately.     5) Digit Abduction/Adduction Hold hand palm down flat on table. Spread your fingers apart and back together.       

## 2017-12-31 NOTE — Therapy (Signed)
[facility] [address] [place], [place], [address] Phone: [phone]   Fax:  [phone]  Occupational Therapy Evaluation  Patient Details  Name: Casey Castillo MRN: [id number] Date of Birth: 1945-09-11 Referring Provider: Dr. Kristeen Miss   Encounter Date: 12/31/2017  OT End of Session - 12/31/17 1634    Visit Number  1    Number of Visits  16    Date for OT Re-Evaluation  03/01/18 mini-reassessment 01/28/2018    Authorization Type  [employer]15 copay    Authorization Time Period  visits based on medical necessity    OT Start Time  1352    OT Stop Time  1426    OT Time Calculation (min)  34 min    Activity Tolerance  Patient tolerated treatment well    Behavior During Therapy  [facility] for tasks assessed/performed       Past Medical History:  Diagnosis Date  . Arthritis   . Cervical myelopathy with cervical radiculopathy   . Coronary artery disease    5%blockage on left anterior descending Dr. Sallyanne Kuster   . Diverticulitis    hx of  . Enlarged prostate    takes flomax  . GERD (gastroesophageal reflux disease)   . History of blood clots    november, 5 1995  . Hyperlipidemia    primary Dr. Selena Batten  . Kidney stone    hx of sees Dr. Chauncey Cruel. [place]  . Pneumonia    as a child  . PTSD (post-traumatic stress disorder)   . Seasonal allergies   . Urinary frequency    hx of     Past Surgical History:  Procedure Laterality Date  . ANTERIOR CERVICAL DECOMP/DISCECTOMY FUSION  01/28/2012   Procedure: ANTERIOR CERVICAL DECOMPRESSION/DISCECTOMY FUSION 2 LEVELS;  Surgeon: Kristeen Miss, MD;  Location: [facility];  Service: Neurosurgery;  Laterality: N/A;  Cervical six-seven, cervical seven-thoracic one Anterior cervical decompression/diskectomy/fusion  . ANTERIOR CERVICAL DECOMP/DISCECTOMY FUSION N/A 12/24/2017   Procedure: ANTERIOR CERVICAL DECOMPRESSION/DISCECTOMY FUSION CERVICAL FOUR-FIVE/CERVICAL FIVE-SIX TWO LEVELS;   Surgeon: Kristeen Miss, MD;  Location: [facility];  Service: Neurosurgery;  Laterality: N/A;  ANTERIOR CERVICAL DECOMPRESSION/DISCECTOMY FUSION CERVICAL FOUR-FIVE/CERVICAL FIVE-SIX TWO LEVELS  . CARDIAC CATHETERIZATION     09-06-11   . CARDIOVASCULAR STRESS TEST     2006 and 2008 at [facility] heart and vascular, 2011 myoview and echo  . COLONOSCOPY    . COLONOSCOPY N/A 08/30/2015   Procedure: COLONOSCOPY;  Surgeon: Aviva Signs, MD;  [facility];  Service: Gastroenterology;  Laterality: N/A;  . EYE SURGERY     Laser for torn retina  . HEMORRHOID SURGERY     sept 8, 2008  . KIDNEY STONE SURGERY     lithotripsy 1980's  . KNEE SURGERY     left 04-11-94 ( has had two surgeries to left knee)  . NASAL SINUS SURGERY     Jun 30 1998  . NM MYOVIEW LTD  07/20/2009   no ischemia  . PILONIDAL CYST / SINUS EXCISION     surgery 01-08-74  . SHOULDER SURGERY     right 12-20-2003  . TRIGGER FINGER RELEASE     03-21-98 right hand ring finger  . US ECHOCARDIOGRAPHY  07/20/2009   LA & RA mildly dilated,trace MR,TR.    There were no vitals filed for this visit.  Subjective Assessment - 12/31/17 1631    Subjective   S: I wanted to have surgery and get this fixed  before it began to atrophy so bad.     Pertinent History  Pt is a 72 y/o male presenting with LUE weakness present for over 1 year but worsening over the past 3 months. Pt underwent a anterior cervical decompression on 12/24/17 and reports improvement in thumb opposition since that time. Pt was referred to occupational therapy for evaluation and treatment by Dr. Kristeen Miss.     Patient Stated Goals  To be able to use my left hand as dominant    Currently in Pain?  No/denies        [facility] OT Assessment - 12/31/17 1351      Assessment   Medical Diagnosis  LUE weakness    Referring Provider  Dr. Kristeen Miss    Onset Date/Surgical Date  12/24/17 sx date    Hand Dominance  Left    Prior Therapy  PT for same issue approximately 1 year  ago      Precautions   Precautions  Cervical    Precaution Comments  Anterior Cervical Decompression of C4-5, C5-6 on 12/24/17       Restrictions   Weight Bearing Restrictions  No      Balance Screen   Has the patient fallen in the past 6 months  No    Has the patient had a decrease in activity level because of a fear of falling?   No    Is the patient reluctant to leave their home because of a fear of falling?   No      Prior Function   Level of Independence  Independent    Leisure  piddling on stuff around the house/shop, golf, gardening, yardwork. Working on and showing car/truck      ADL   ADL comments  Pt is left dominant and is having significant difficulty using left hand for ADLs. Pt is unable to grasp and hold objects for sustained time periods,  pt is unable to write with his left dominant hand. He is able to use utensils.       Written Expression   Dominant Hand  Left      Cognition   Overall Cognitive Status  Within Functional Limits for tasks assessed      Coordination   Gross Motor Movements are Fluid and Coordinated  Yes    Fine Motor Movements are Fluid and Coordinated  No    9 Hole Peg Test  -- test next session      ROM / Strength   AROM / PROM / Strength  AROM;Strength;PROM      Strength   Strength Assessment Site  Shoulder;Elbow;Forearm;Wrist;Hand    Right/Left Shoulder  Left    Left Shoulder Flexion  5/5    Left Shoulder ABduction  5/5    Left Shoulder Internal Rotation  5/5    Left Shoulder External Rotation  5/5    Right/Left Elbow  Left    Left Elbow Flexion  5/5    Left Elbow Extension  5/5    Right/Left Forearm  Left    Left Forearm Pronation  5/5    Left Forearm Supination  5/5    Right/Left Wrist  Left    Left Wrist Flexion  4/5    Left Wrist Extension  4+/5    Left Wrist Radial Deviation  4/5    Left Wrist Ulnar Deviation  4/5    Right/Left hand  Right;Left    Right Hand Gross Grasp  Functional    Right Hand Grip (  lbs)  90    Right  Hand Lateral Pinch  24 lbs    Right Hand 3 Point Pinch  20 lbs    Left Hand Gross Grasp  Impaired    Left Hand Grip (lbs)  2    Left Hand Lateral Pinch  2 lbs    Left Hand 3 Point Pinch  3 lbs                      OT Education - 12/31/17 1630    Education Details  hand ROM, yellow theraputty for grip/pinch strengthening    Person(s) Educated  Patient    Methods  Explanation;Demonstration;Handout    Comprehension  Verbalized understanding;Returned demonstration       OT Short Term Goals - 12/31/17 1641      OT SHORT TERM GOAL #1   Title  Pt will be provided with and educated on HEP for improved use of left hand as dominant during ADLs.     Time  4    Period  Weeks    Status  New    Target Date  01/30/18      OT SHORT TERM GOAL #2   Title  Pt will improve left grip strength by 15# to improve ability to grasp and hold lightweight items in the left hand during ADLs.     Time  4    Period  Weeks    Status  New      OT SHORT TERM GOAL #3   Title  Pt will improve left pinch strength by 4# to improve ability to using writing utensils with with left hand.     Time  4    Period  Weeks    Status  New      OT SHORT TERM GOAL #4   Title  Pt will improve left wrist strength to 5/5 to improve ability to stabilize hand and any items held when working on cars.     Time  4    Period  Weeks    Status  New        OT Long Term Goals - 12/31/17 1643      OT LONG TERM GOAL #1   Title  Pt will achieve highest level of functioning using LUE as dominant during ADL and leisure tasks.     Time  8    Period  Weeks    Status  New    Target Date  03/01/18      OT LONG TERM GOAL #2   Title  Pt will improve left grip strength by 30# to improve ability to hold golf clubs.     Time  8    Period  Weeks    Status  New      OT LONG TERM GOAL #3   Title  Pt will improve left pinch strength by 8# to improve ability to hold small items such as keys in left hand without dropping.     Period  Weeks    Status  New            Plan - 12/31/17 1635    Clinical Impression Statement  A: Pt is a 72 y/o male presenting with left hand and wrist weakness and impaired functional use preventing use of LUE as dominant during daily tasks. Pt with excellent strength in left shoulder/elbow/forearm, and good wrist strength. Pt has very minimal grip strength and is very limited in intrinsic muscle  strength. Pt does report improvement with thumb opposition since sx on 7/16. OT notes pt compensates with max extensor activation when attempting to grip items and perform hand ROM/strength tasks, however is able to complete tasks with wrist in neutral with concentration and max effort. Of note: pt received PT in 02/2017 for LUE weakness howvever hand dexterity was good and strength was 71#.     Occupational Profile and client history currently impacting functional performance  Pt is independent and is motivated to return to using LUE as dominant during ADLs and leisure tasks.     Occupational performance deficits (Please refer to evaluation for details):  ADL's;IADL's;Work;Leisure    Rehab Potential  Good    OT Frequency  2x / week    OT Duration  8 weeks    OT Treatment/Interventions  Self-care/ADL training;Therapeutic exercise;Ultrasound;Neuromuscular education;Manual Therapy;Therapeutic activities;Cryotherapy;DME and/or AE instruction;Electrical Stimulation;Moist Heat;Passive range of motion;Patient/family education    Plan  P: Pt will benefit from skilled OT services to increase hand and wrist strength, improve coordination, and improve use of LUE as dominant during daily tasks. Treatment plan: left wrist strengthening, grip and pinch strengthening, fine motor coordination training, modalities prn. Next Session: complete 9 hole peg test and add coordination goals    Clinical Decision Making  Several treatment options, min-mod task modification necessary    Consulted and Agree with Plan of Care   Patient       Patient will benefit from skilled therapeutic intervention in order to improve the following deficits and impairments:  Decreased activity tolerance, Decreased knowledge of use of DME, Decreased strength, Pain, Decreased coordination, Impaired UE functional use  Visit Diagnosis: Other lack of coordination  Other symptoms and signs involving the musculoskeletal system    Problem List Patient Active Problem List   Diagnosis Date Noted  . Cervical spondylosis with myelopathy and radiculopathy 12/24/2017  . Coronary artery disease involving native coronary artery of native heart without angina pectoris 12/23/2017  . Hypercholesterolemia 12/23/2017  . Mild obesity 09/20/2016  . Edema of both legs 01/17/2013  . Long term current use of anticoagulant therapy 12/30/2012  . History of deep vein thrombosis (DVT) of lower extremity 12/26/2012  . Dyslipidemia 12/26/2012  . Coronary atherosclerosis without stenosis 12/26/2012   Guadelupe Sabin, OTR/L  [phone] 12/31/2017, 4:47 PM  [facility] [address] [place], [place], [address] Phone: [phone]   Fax:  [phone]  Name: BETHANY CUMMING MRN: [id number] Date of Birth: 1946/04/11

## 2018-01-02 ENCOUNTER — Encounter (HOSPITAL_COMMUNITY): Payer: Self-pay | Admitting: Occupational Therapy

## 2018-01-02 ENCOUNTER — Ambulatory Visit (HOSPITAL_COMMUNITY): Payer: PPO | Admitting: Occupational Therapy

## 2018-01-02 DIAGNOSIS — R278 Other lack of coordination: Secondary | ICD-10-CM

## 2018-01-02 DIAGNOSIS — R29898 Other symptoms and signs involving the musculoskeletal system: Secondary | ICD-10-CM

## 2018-01-02 NOTE — Therapy (Signed)
[facility] [place], [place], [address] Phone: [phone]   Fax:  [phone]  Occupational Therapy Treatment  Patient Details  Name: Casey Castillo MRN: [id number] Date of Birth: 1946/01/31 Referring Provider: Dr. Kristeen Miss   Encounter Date: 01/02/2018  OT End of Session - 01/02/18 1712    Visit Number  2    Number of Visits  16    Date for OT Re-Evaluation  03/01/18 mini-reassessment 01/28/2018    Authorization Type  [employer]15 copay    Authorization Time Period  visits based on medical necessity    OT Start Time  1516    OT Stop Time  1556    OT Time Calculation (min)  40 min    Activity Tolerance  Patient tolerated treatment well    Behavior During Therapy  [facility] for tasks assessed/performed       Past Medical History:  Diagnosis Date  . Arthritis   . Cervical myelopathy with cervical radiculopathy   . Coronary artery disease    5%blockage on left anterior descending Dr. Sallyanne Kuster   . Diverticulitis    hx of  . Enlarged prostate    takes flomax  . GERD (gastroesophageal reflux disease)   . History of blood clots    november, 5 1995  . Hyperlipidemia    primary Dr. Selena Batten  . Kidney stone    hx of sees Dr. Chauncey Cruel. [place] [facility]  . Pneumonia    as a child  . PTSD (post-traumatic stress disorder)   . Seasonal allergies   . Urinary frequency    hx of     Past Surgical History:  Procedure Laterality Date  . ANTERIOR CERVICAL DECOMP/DISCECTOMY FUSION  01/28/2012   Procedure: ANTERIOR CERVICAL DECOMPRESSION/DISCECTOMY FUSION 2 LEVELS;  Surgeon: Kristeen Miss, MD;  Location: [place];  Service: Neurosurgery;  Laterality: N/A;  Cervical six-seven, cervical seven-thoracic one Anterior cervical decompression/diskectomy/fusion  . ANTERIOR CERVICAL DECOMP/DISCECTOMY FUSION N/A 12/24/2017   Procedure: ANTERIOR CERVICAL DECOMPRESSION/DISCECTOMY FUSION CERVICAL FOUR-FIVE/CERVICAL FIVE-SIX TWO LEVELS;   Surgeon: Kristeen Miss, MD;  Location: [facility];  Service: Neurosurgery;  Laterality: N/A;  ANTERIOR CERVICAL DECOMPRESSION/DISCECTOMY FUSION CERVICAL FOUR-FIVE/CERVICAL FIVE-SIX TWO LEVELS  . CARDIAC CATHETERIZATION     09-06-11   . CARDIOVASCULAR STRESS TEST     2006 and 2008 at [facility] heart and vascular, 2011 myoview and echo  . COLONOSCOPY    . COLONOSCOPY N/A 08/30/2015   Procedure: COLONOSCOPY;  Surgeon: Aviva Signs, MD;  [facility];  Service: Gastroenterology;  Laterality: N/A;  . EYE SURGERY     Laser for torn retina  . HEMORRHOID SURGERY     sept 8, 2008  . KIDNEY STONE SURGERY     lithotripsy 1980's  . KNEE SURGERY     left 04-11-94 ( has had two surgeries to left knee)  . NASAL SINUS SURGERY     Jun 30 1998  . NM MYOVIEW LTD  07/20/2009   no ischemia  . PILONIDAL CYST / SINUS EXCISION     surgery 01-08-74  . SHOULDER SURGERY     right 12-20-2003  . TRIGGER FINGER RELEASE     03-21-98 right hand ring finger  . US ECHOCARDIOGRAPHY  07/20/2009   LA & RA mildly dilated,trace MR,TR.    There were no vitals filed for this visit.  Subjective Assessment - 01/02/18 1515    Subjective   S: I mowed the grass today.  Currently in Pain?  No/denies         [facility] OT Assessment - 01/02/18 1514      Assessment   Medical Diagnosis  LUE weakness      Precautions   Precautions  Cervical    Precaution Comments  Anterior Cervical Decompression of C4-5, C5-6 on 12/24/17                OT Treatments/Exercises (OP) - 01/02/18 1518      Exercises   Exercises  Wrist;Hand;Theraputty      Wrist Exercises   Wrist Flexion  Theraband;15 reps    Theraband Level (Wrist Flexion)  Level 2 (Red)    Wrist Radial Deviation  Theraband;15 reps    Theraband Level (Radial Deviation)  Level 2 (Red)      Additional Wrist Exercises   Sponges  11, 13    Hand Gripper with Large Beads  all beads gripper at 7#    Hand Gripper with Medium Beads  unable to complete       Hand Exercises   MCPJ Flexion  PROM;5 reps    Thumb Opposition  A/ROM 10X to MCP of ring finger    Other Hand Exercises  Pt used yellow clothespins and lateral pinch alternating between thumb and middle/index fingers to place pins on middle horizontal bar. Pt then placed red clothespins on smaller bottom horizontal bar with mod/max difficulty. Pt removed all clothespins using 3 point pinch.       Fine Motor Coordination (Hand/Wrist)   Fine Motor Coordination  Picking up coins;Stacking coins    Picking up coins  Pt picked up sequence chips using index finger to bring to edge of table and using thumb and index finger to pick up. 10 chips completed    Stacking coins  Pt stacked sequence chips after picking up. Mod difficulty, dropping 4               OT Short Term Goals - 01/02/18 1715      OT SHORT TERM GOAL #1   Title  Pt will be provided with and educated on HEP for improved use of left hand as dominant during ADLs.     Time  4    Period  Weeks    Status  On-going      OT SHORT TERM GOAL #2   Title  Pt will improve left grip strength by 15# to improve ability to grasp and hold lightweight items in the left hand during ADLs.     Time  4    Period  Weeks    Status  On-going      OT SHORT TERM GOAL #3   Title  Pt will improve left pinch strength by 4# to improve ability to using writing utensils with with left hand.     Time  4    Period  Weeks    Status  On-going      OT SHORT TERM GOAL #4   Title  Pt will improve left wrist strength to 5/5 to improve ability to stabilize hand and any items held when working on cars.     Time  4    Period  Weeks    Status  On-going        OT Long Term Goals - 01/02/18 1715      OT LONG TERM GOAL #1   Title  Pt will achieve highest level of functioning using LUE as dominant during ADL and leisure tasks.  Time  8    Period  Weeks    Status  On-going      OT LONG TERM GOAL #2   Title  Pt will improve left grip strength by 30#  to improve ability to hold golf clubs.     Time  8    Period  Weeks    Status  On-going      OT LONG TERM GOAL #3   Title  Pt will improve left pinch strength by 8# to improve ability to hold small items such as keys in left hand without dropping.    Period  Weeks    Status  On-going            Plan - 01/02/18 1712    Clinical Impression Statement  A: Initiated hand/digit ROM, grip and pinch strengthening, and coordination activities today. Pt able to use hand gripper at 7# with max effort and occasional rest breaks for large beads, attempted medium beads with gripper horizontal however pt unble to lift gripper off table and maintain a level plane. Pt has improved with thumb opposition since evaluation, continues to have max weakness in all thumb ROM/strength exercises. Verbal cuing for form and technique during activities.     Plan  P: complete 9 hole peg test and add coordination goals.  Attempt medium beads again, add tendon glides and theraputty work. Provide wrist flexion and radial deviation theraband exercises for HEP       Patient will benefit from skilled therapeutic intervention in order to improve the following deficits and impairments:  Decreased activity tolerance, Decreased knowledge of use of DME, Decreased strength, Pain, Decreased coordination, Impaired UE functional use  Visit Diagnosis: Other lack of coordination  Other symptoms and signs involving the musculoskeletal system    Problem List Patient Active Problem List   Diagnosis Date Noted  . Cervical spondylosis with myelopathy and radiculopathy 12/24/2017  . Coronary artery disease involving native coronary artery of native heart without angina pectoris 12/23/2017  . Hypercholesterolemia 12/23/2017  . Mild obesity 09/20/2016  . Edema of both legs 01/17/2013  . Long term current use of anticoagulant therapy 12/30/2012  . History of deep vein thrombosis (DVT) of lower extremity 12/26/2012  . Dyslipidemia  12/26/2012  . Coronary atherosclerosis without stenosis 12/26/2012   Guadelupe Sabin, OTR/L  [phone] 01/02/2018, 5:16 PM  [facility] [address] [place], [place], [address] Phone: [phone]   Fax:  [phone]  Name: Casey Castillo MRN: [id number] Date of Birth: [birth year]

## 2018-01-08 ENCOUNTER — Encounter (HOSPITAL_COMMUNITY): Payer: Self-pay

## 2018-01-08 ENCOUNTER — Ambulatory Visit (HOSPITAL_COMMUNITY): Payer: PPO

## 2018-01-08 ENCOUNTER — Other Ambulatory Visit: Payer: Self-pay

## 2018-01-08 DIAGNOSIS — R278 Other lack of coordination: Secondary | ICD-10-CM | POA: Diagnosis not present

## 2018-01-08 DIAGNOSIS — R29898 Other symptoms and signs involving the musculoskeletal system: Secondary | ICD-10-CM

## 2018-01-08 NOTE — Therapy (Signed)
[place], [place], [address] Phone: [phone]   Fax:  [phone]  Occupational Therapy Treatment  Patient Details  Name: Casey Castillo MRN: [id number] Date of Birth: [birth year] Referring Provider: Dr. Kristeen Miss   Encounter Date: 01/08/2018  OT End of Session - 01/08/18 1136    Visit Number  3    Number of Visits  16    Date for OT Re-Evaluation  03/01/18 mini-reassessment 01/28/2018    Authorization Type  [employer]-$15 copay    Authorization Time Period  visits based on medical necessity    OT Start Time  0902    OT Stop Time  0944    OT Time Calculation (min)  42 min    Activity Tolerance  Patient tolerated treatment well    Behavior During Therapy  [facility] for tasks assessed/performed       Past Medical History:  Diagnosis Date  . Arthritis   . Cervical myelopathy with cervical radiculopathy   . Coronary artery disease    5%blockage on left anterior descending Dr. Sallyanne Kuster   . Diverticulitis    hx of  . Enlarged prostate    takes flomax  . GERD (gastroesophageal reflux disease)   . History of blood clots    november, 5 1995  . Hyperlipidemia    primary Dr. Selena Batten  . Kidney stone    hx of sees Dr. Chauncey Cruel. [place]  . Pneumonia    as a child  . PTSD (post-traumatic stress disorder)   . Seasonal allergies   . Urinary frequency    hx of     Past Surgical History:  Procedure Laterality Date  . ANTERIOR CERVICAL DECOMP/DISCECTOMY FUSION  01/28/2012   Procedure: ANTERIOR CERVICAL DECOMPRESSION/DISCECTOMY FUSION 2 LEVELS;  Surgeon: Kristeen Miss, MD;  Location: [place] [facility];  Service: Neurosurgery;  Laterality: N/A;  Cervical six-seven, cervical seven-thoracic one Anterior cervical decompression/diskectomy/fusion  . ANTERIOR CERVICAL DECOMP/DISCECTOMY FUSION N/A 12/24/2017   Procedure: ANTERIOR CERVICAL DECOMPRESSION/DISCECTOMY FUSION CERVICAL FOUR-FIVE/CERVICAL FIVE-SIX TWO LEVELS;   Surgeon: Kristeen Miss, MD;  Location: [facility];  Service: Neurosurgery;  Laterality: N/A;  ANTERIOR CERVICAL DECOMPRESSION/DISCECTOMY FUSION CERVICAL FOUR-FIVE/CERVICAL FIVE-SIX TWO LEVELS  . CARDIAC CATHETERIZATION     09-06-11   . CARDIOVASCULAR STRESS TEST     2006 and 2008 at [facility] heart and vascular, 2011 myoview and echo  . COLONOSCOPY    . COLONOSCOPY N/A 08/30/2015   Procedure: COLONOSCOPY;  Surgeon: Aviva Signs, MD;  [facility];  Service: Gastroenterology;  Laterality: N/A;  . EYE SURGERY     Laser for torn retina  . HEMORRHOID SURGERY     sept 8, 2008  . KIDNEY STONE SURGERY     lithotripsy 1980's  . KNEE SURGERY     left 04-11-94 ( has had two surgeries to left knee)  . NASAL SINUS SURGERY     Jun 30 1998  . NM MYOVIEW LTD  07/20/2009   no ischemia  . PILONIDAL CYST / SINUS EXCISION     surgery 01-08-74  . SHOULDER SURGERY     right 12-20-2003  . TRIGGER FINGER RELEASE     03-21-98 right hand ring finger  . US ECHOCARDIOGRAPHY  07/20/2009   LA & RA mildly dilated,trace MR,TR.    There were no vitals filed for this visit.  Subjective Assessment - 01/08/18 0901    Subjective   S: The thumb and the pointer finger just won't work  in conjunction. It feels like the thumb is jammed all the time.    Currently in Pain?  No/denies         [facility] OT Assessment - 01/08/18 0901      Assessment   Medical Diagnosis  LUE weakness      Precautions   Precautions  Cervical    Precaution Comments  Anterior Cervical Decompression of C4-5, C5-6 on 12/24/17       Coordination   9 Hole Peg Test  Right;Left    Right 9 Hole Peg Test  25.92"    Left 9 Hole Peg Test  1\' 15"                OT Treatments/Exercises (OP) - 01/08/18 0901      Exercises   Exercises  Wrist;Hand;Theraputty;Elbow      Additional Elbow Exercises   Hand Gripper with Large Beads  all beads gripper at 7# held horizontally    Hand Gripper with Medium Beads  unable to complete      Hand  Exercises   Other Hand Exercises  Thumb MCP and IP flexion, extension, and abduction PROM for 5 reps and strengthening with standard rubberband for 15 reps    Other Hand Exercises  tendon glides; 10X      Fine Motor Coordination (Hand/Wrist)   Fine Motor Coordination  Grooved pegs    Grooved pegs  Patient used yellow clothespin with 2 point pinch utilizing thumb and pointer finger to pick up grooved pegs and place them in container.             OT Education - 01/08/18 1134    Education Details  Patient given tendon glides to work on as part of HEP.    Person(s) Educated  Patient    Methods  Explanation;Demonstration;Handout    Comprehension  Verbalized understanding;Returned demonstration       OT Short Term Goals - 01/08/18 1137      OT SHORT TERM GOAL #1   Title  Pt will be provided with and educated on HEP for improved use of left hand as dominant during ADLs.     Time  4    Period  Weeks    Status  On-going      OT SHORT TERM GOAL #2   Title  Pt will improve left grip strength by 15# to improve ability to grasp and hold lightweight items in the left hand during ADLs.     Time  4    Period  Weeks    Status  On-going      OT SHORT TERM GOAL #3   Title  Pt will improve left pinch strength by 4# to improve ability to using writing utensils with with left hand.     Time  4    Period  Weeks    Status  On-going      OT SHORT TERM GOAL #4   Title  Pt will improve left wrist strength to 5/5 to improve ability to stabilize hand and any items held when working on cars.     Time  4    Period  Weeks    Status  On-going      OT SHORT TERM GOAL #5   Title  Patient will increase fine motor coordination as evidenced by completing the 9 hole peg test with his left hand in 1\' 10"  or less in order to increase use of nondominant left hand during ADLs.    Time  4    Period  Weeks    Status  New    Target Date  01/30/18        OT Long Term Goals - 01/08/18 1139      OT LONG  TERM GOAL #1   Title  Pt will achieve highest level of functioning using LUE as dominant during ADL and leisure tasks.     Time  8    Period  Weeks    Status  On-going      OT LONG TERM GOAL #2   Title  Pt will improve left grip strength by 30# to improve ability to hold golf clubs.     Time  8    Period  Weeks    Status  On-going      OT LONG TERM GOAL #3   Title  Pt will improve left pinch strength by 8# to improve ability to hold small items such as keys in left hand without dropping.    Period  Weeks    Status  On-going      OT LONG TERM GOAL #4   Title  Patient will increase fine motor coordination as evidenced by completing the 9 hole peg test with his left hand in 45" or less in order to increase use of nondominant left hand during ADLs.    Time  8    Period  Weeks    Status  New    Target Date  03/01/18            Plan - 01/08/18 1141    Clinical Impression Statement  A: Initiated hand/digit ROM, grip and pinch strengthening, and coordination activities today. Pt able to use hand gripper at 7# held horizontally with max effort for large beads, attempted medium beads with gripper horizontal however pt unble to lift gripper off table and maintain a level plane. Patient had to brace wrist with right hand in order to complete gripper exercise. VC to avoid compensatory leaning during exercise. Tendon glides added. Patient demonstrating deficits in ROM of the digits and experienced max difficulty flexing digits during exercises. Patient is still experiencing max weakness in all thumb ROM/strength exercises. Verbal cuing for form and technique during activities. 9 hole peg test performed and short term and long term coordination goals added this session.    Plan  P: Provide wrist flexion and radial deviation theraband exercises for HEP. Attempt estim to facilitate 2 point pinch with thumb and index finger. Complete FOTO. Continue to work on thumb and digit ROM.    Consulted and  Agree with Plan of Care  Patient       Patient will benefit from skilled therapeutic intervention in order to improve the following deficits and impairments:  Decreased activity tolerance, Decreased knowledge of use of DME, Decreased strength, Pain, Decreased coordination, Impaired UE functional use  Visit Diagnosis: Other lack of coordination  Other symptoms and signs involving the musculoskeletal system    Problem List Patient Active Problem List   Diagnosis Date Noted  . Cervical spondylosis with myelopathy and radiculopathy 12/24/2017  . Coronary artery disease involving native coronary artery of native heart without angina pectoris 12/23/2017  . Hypercholesterolemia 12/23/2017  . Mild obesity 09/20/2016  . Edema of both legs 01/17/2013  . Long term current use of anticoagulant therapy 12/30/2012  . History of deep vein thrombosis (DVT) of lower extremity 12/26/2012  . Dyslipidemia 12/26/2012  . Coronary atherosclerosis without stenosis 12/26/2012    Roderic Palau, OT  student 01/08/2018, 11:47 AM  [place] [address] [place], [place], [address] Phone: [phone]   Fax:  [phone]  Name: Casey Castillo MRN: [id number] Date of Birth: Oct 01, 1945

## 2018-01-10 ENCOUNTER — Ambulatory Visit (HOSPITAL_COMMUNITY): Payer: PPO | Attending: Neurological Surgery | Admitting: Specialist

## 2018-01-10 ENCOUNTER — Encounter (HOSPITAL_COMMUNITY): Payer: Self-pay | Admitting: Specialist

## 2018-01-10 DIAGNOSIS — R278 Other lack of coordination: Secondary | ICD-10-CM

## 2018-01-10 DIAGNOSIS — R29898 Other symptoms and signs involving the musculoskeletal system: Secondary | ICD-10-CM | POA: Diagnosis not present

## 2018-01-10 NOTE — Therapy (Signed)
[place], [place], [address] Phone: [phone]   Fax:  [phone]  Occupational Therapy Treatment  Patient Details  Name: Casey Castillo MRN: [id number] Date of Birth: 09-26-1945 Referring Provider: Dr. Kristeen Miss   Encounter Date: 01/10/2018  OT End of Session - 01/10/18 1018    Visit Number  4    Number of Visits  16    Date for OT Re-Evaluation  03/01/18 mini reassessment on 01/28/18    Authorization Type  [employer]-$15 copay    Authorization Time Period  visits based on medical necessity    OT Start Time  0900    OT Stop Time  0945    OT Time Calculation (min)  45 min    Activity Tolerance  Patient tolerated treatment well    Behavior During Therapy  [facility] for tasks assessed/performed       Past Medical History:  Diagnosis Date  . Arthritis   . Cervical myelopathy with cervical radiculopathy   . Coronary artery disease    5%blockage on left anterior descending Dr. Sallyanne Kuster   . Diverticulitis    hx of  . Enlarged prostate    takes flomax  . GERD (gastroesophageal reflux disease)   . History of blood clots    november, 5 1995  . Hyperlipidemia    primary Dr. Selena Batten  . Kidney stone    hx of sees Dr. Chauncey Cruel. [place] [facility]  . Pneumonia    as a child  . PTSD (post-traumatic stress disorder)   . Seasonal allergies   . Urinary frequency    hx of     Past Surgical History:  Procedure Laterality Date  . ANTERIOR CERVICAL DECOMP/DISCECTOMY FUSION  01/28/2012   Procedure: ANTERIOR CERVICAL DECOMPRESSION/DISCECTOMY FUSION 2 LEVELS;  Surgeon: Kristeen Miss, MD;  Location: [place] [facility];  Service: Neurosurgery;  Laterality: N/A;  Cervical six-seven, cervical seven-thoracic one Anterior cervical decompression/diskectomy/fusion  . ANTERIOR CERVICAL DECOMP/DISCECTOMY FUSION N/A 12/24/2017   Procedure: ANTERIOR CERVICAL DECOMPRESSION/DISCECTOMY FUSION CERVICAL FOUR-FIVE/CERVICAL FIVE-SIX TWO LEVELS;   Surgeon: Kristeen Miss, MD;  Location: [facility];  Service: Neurosurgery;  Laterality: N/A;  ANTERIOR CERVICAL DECOMPRESSION/DISCECTOMY FUSION CERVICAL FOUR-FIVE/CERVICAL FIVE-SIX TWO LEVELS  . CARDIAC CATHETERIZATION     09-06-11   . CARDIOVASCULAR STRESS TEST     2006 and 2008 at [facility] heart and vascular, 2011 myoview and echo  . COLONOSCOPY    . COLONOSCOPY N/A 08/30/2015   Procedure: COLONOSCOPY;  Surgeon: Aviva Signs, MD;  [facility];  Service: Gastroenterology;  Laterality: N/A;  . EYE SURGERY     Laser for torn retina  . HEMORRHOID SURGERY     sept 8, 2008  . KIDNEY STONE SURGERY     lithotripsy 1980's  . KNEE SURGERY     left 04-11-94 ( has had two surgeries to left knee)  . NASAL SINUS SURGERY     Jun 30 1998  . NM MYOVIEW LTD  07/20/2009   no ischemia  . PILONIDAL CYST / SINUS EXCISION     surgery 01-08-74  . SHOULDER SURGERY     right 12-20-2003  . TRIGGER FINGER RELEASE     03-21-98 right hand ring finger  . US ECHOCARDIOGRAPHY  07/20/2009   LA & RA mildly dilated,trace MR,TR.    There were no vitals filed for this visit.  Subjective Assessment - 01/10/18 1018    Subjective   S:  I cant get my thumb and  finger to work the way I want them to.      Currently in Pain?  No/denies         [facility] OT Assessment - 01/10/18 0001      Assessment   Medical Diagnosis  LUE weakness      Precautions   Precautions  Cervical    Precaution Comments  Anterior Cervical Decompression of C4-5, C5-6 on 12/24/17                OT Treatments/Exercises (OP) - 01/10/18 0001      Exercises   Exercises  Hand;Theraputty;Wrist      Wrist Exercises   Wrist Flexion  AROM;PROM;10 reps    Wrist Extension  AROM;PROM;10 reps    Wrist Radial Deviation  AROM;PROM;10 reps    Wrist Ulnar Deviation  PROM;AROM;10 reps      Additional Wrist Exercises   Sponges  9, 9, 11, 10 working on in hand translation, digit a/rom, and grip strength       Hand Exercises   MCPJ  Flexion  PROM;AROM index , thumb,  and gross all digit     PIPJ Flexion  PROM;AROM;5 reps index and thumb    DIPJ Flexion  PROM;AROM;5 reps index    Digit Composite ABduction  PROM;AROM;10 reps    Digit Composite ADduction  PROM;AROM;10 reps    Thumb Opposition  A/ROM 10X to MCP of ring finger, then AA/ROM 10 times to small finger tip       Theraputty   Theraputty - Flatten  yellow in standing     Theraputty - Roll  yellow    Theraputty - Grip  supinated with mod pa and verbal guidance to decrease compensatory movements and then pronated grasp with mod difficulty               OT Short Term Goals - 01/08/18 1137      OT SHORT TERM GOAL #1   Title  Pt will be provided with and educated on HEP for improved use of left hand as dominant during ADLs.     Time  4    Period  Weeks    Status  On-going      OT SHORT TERM GOAL #2   Title  Pt will improve left grip strength by 15# to improve ability to grasp and hold lightweight items in the left hand during ADLs.     Time  4    Period  Weeks    Status  On-going      OT SHORT TERM GOAL #3   Title  Pt will improve left pinch strength by 4# to improve ability to using writing utensils with with left hand.     Time  4    Period  Weeks    Status  On-going      OT SHORT TERM GOAL #4   Title  Pt will improve left wrist strength to 5/5 to improve ability to stabilize hand and any items held when working on cars.     Time  4    Period  Weeks    Status  On-going      OT SHORT TERM GOAL #5   Title  Patient will increase fine motor coordination as evidenced by completing the 9 hole peg test with his left hand in 1\' 10"  or less in order to increase use of nondominant left hand during ADLs.    Time  4    Period  Weeks  Status  New    Target Date  01/30/18        OT Long Term Goals - 01/08/18 1139      OT LONG TERM GOAL #1   Title  Pt will achieve highest level of functioning using LUE as dominant during ADL and leisure tasks.      Time  8    Period  Weeks    Status  On-going      OT LONG TERM GOAL #2   Title  Pt will improve left grip strength by 30# to improve ability to hold golf clubs.     Time  8    Period  Weeks    Status  On-going      OT LONG TERM GOAL #3   Title  Pt will improve left pinch strength by 8# to improve ability to hold small items such as keys in left hand without dropping.    Period  Weeks    Status  On-going      OT LONG TERM GOAL #4   Title  Patient will increase fine motor coordination as evidenced by completing the 9 hole peg test with his left hand in 45" or less in order to increase use of nondominant left hand during ADLs.    Time  8    Period  Weeks    Status  New    Target Date  03/01/18            Plan - 01/10/18 1019    Clinical Impression Statement  A:  Patient treatment focused on decreasing compensatory arm movements while improving in hand manipulation skill, improved isolated digit movement and improving grip strength.  Patient has significant atrophy in left thumb musculature and would be a good candidiate for estim in combination with neuroreeducation activities to improve left hand normal movement pattern.  Patient noted improved adduction of thumb and index finger this date from previous sessions.     Plan  P:  Attemp estim to improve combined thumb flexion and index finger flexion needed for functional grasp of objects.  Continue to improve functional grip strength while decreasing compensatory arm movements.  Complete FOTO.        Patient will benefit from skilled therapeutic intervention in order to improve the following deficits and impairments:  Decreased activity tolerance, Decreased knowledge of use of DME, Decreased strength, Pain, Decreased coordination, Impaired UE functional use  Visit Diagnosis: Other lack of coordination  Other symptoms and signs involving the musculoskeletal system    Problem List Patient Active Problem List   Diagnosis  Date Noted  . Cervical spondylosis with myelopathy and radiculopathy 12/24/2017  . Coronary artery disease involving native coronary artery of native heart without angina pectoris 12/23/2017  . Hypercholesterolemia 12/23/2017  . Mild obesity 09/20/2016  . Edema of both legs 01/17/2013  . Long term current use of anticoagulant therapy 12/30/2012  . History of deep vein thrombosis (DVT) of lower extremity 12/26/2012  . Dyslipidemia 12/26/2012  . Coronary atherosclerosis without stenosis 12/26/2012    Vangie Bicker, [place], OTR/L [phone]  01/10/2018, 10:28 AM  [place], [place], [address] Phone: [phone]   Fax:  [phone]  Name: Casey Castillo MRN: [id number] Date of Birth: 1946/05/22

## 2018-01-14 ENCOUNTER — Encounter (HOSPITAL_COMMUNITY): Payer: Self-pay

## 2018-01-14 ENCOUNTER — Ambulatory Visit (HOSPITAL_COMMUNITY): Payer: PPO

## 2018-01-14 DIAGNOSIS — R29898 Other symptoms and signs involving the musculoskeletal system: Secondary | ICD-10-CM

## 2018-01-14 DIAGNOSIS — R278 Other lack of coordination: Secondary | ICD-10-CM

## 2018-01-14 NOTE — Therapy (Signed)
[facility] [place], [place], [address] Phone: [phone]   Fax:  [phone]  Occupational Therapy Treatment  Patient Details  Name: Casey Castillo MRN: [id number] Date of Birth: Oct 25, 1945 Referring Provider: Dr. Kristeen Miss   Encounter Date: 01/14/2018  OT End of Session - 01/14/18 1111    Visit Number  5    Number of Visits  16    Date for OT Re-Evaluation  03/01/18 mini reassessment on 01/28/18    Authorization Type  [employer]-$15 copay    Authorization Time Period  visits based on medical necessity    OT Start Time  0903    OT Stop Time  0945    OT Time Calculation (min)  42 min    Activity Tolerance  Patient tolerated treatment well    Behavior During Therapy  [facility] for tasks assessed/performed       Past Medical History:  Diagnosis Date  . Arthritis   . Cervical myelopathy with cervical radiculopathy   . Coronary artery disease    5%blockage on left anterior descending Dr. Sallyanne Kuster   . Diverticulitis    hx of  . Enlarged prostate    takes flomax  . GERD (gastroesophageal reflux disease)   . History of blood clots    november, 5 1995  . Hyperlipidemia    primary Dr. Selena Batten  . Kidney stone    hx of sees Dr. Chauncey Cruel. [place]  . Pneumonia    as a child  . PTSD (post-traumatic stress disorder)   . Seasonal allergies   . Urinary frequency    hx of     Past Surgical History:  Procedure Laterality Date  . ANTERIOR CERVICAL DECOMP/DISCECTOMY FUSION  01/28/2012   Procedure: ANTERIOR CERVICAL DECOMPRESSION/DISCECTOMY FUSION 2 LEVELS;  Surgeon: Kristeen Miss, MD;  Location: [facility];  Service: Neurosurgery;  Laterality: N/A;  Cervical six-seven, cervical seven-thoracic one Anterior cervical decompression/diskectomy/fusion  . ANTERIOR CERVICAL DECOMP/DISCECTOMY FUSION N/A 12/24/2017   Procedure: ANTERIOR CERVICAL DECOMPRESSION/DISCECTOMY FUSION CERVICAL FOUR-FIVE/CERVICAL FIVE-SIX TWO LEVELS;   Surgeon: Kristeen Miss, MD;  Location: [facility];  Service: Neurosurgery;  Laterality: N/A;  ANTERIOR CERVICAL DECOMPRESSION/DISCECTOMY FUSION CERVICAL FOUR-FIVE/CERVICAL FIVE-SIX TWO LEVELS  . CARDIAC CATHETERIZATION     09-06-11   . CARDIOVASCULAR STRESS TEST     2006 and 2008 at [facility] vascular, 2011 myoview and echo  . COLONOSCOPY    . COLONOSCOPY N/A 08/30/2015   Procedure: COLONOSCOPY;  Surgeon: Aviva Signs, MD;  [facility];  Service: Gastroenterology;  Laterality: N/A;  . EYE SURGERY     Laser for torn retina  . HEMORRHOID SURGERY     sept 8, 2008  . KIDNEY STONE SURGERY     lithotripsy 1980's  . KNEE SURGERY     left 04-11-94 ( has had two surgeries to left knee)  . NASAL SINUS SURGERY     Jun 30 1998  . NM MYOVIEW LTD  07/20/2009   no ischemia  . PILONIDAL CYST / SINUS EXCISION     surgery 01-08-74  . SHOULDER SURGERY     right 12-20-2003  . TRIGGER FINGER RELEASE     03-21-98 right hand ring finger  . US ECHOCARDIOGRAPHY  07/20/2009   LA & RA mildly dilated,trace MR,TR.    There were no vitals filed for this visit.  Subjective Assessment - 01/14/18 0924    Subjective   S: I've been working this hand a lot  at home.     Currently in Pain?  No/denies         [facility] OT Assessment - 01/14/18 0924      Assessment   Medical Diagnosis  LUE weakness      Precautions   Precautions  Cervical    Precaution Comments  Anterior Cervical Decompression of C4-5, C5-6 on 12/24/17                OT Treatments/Exercises (OP) - 01/14/18 0924      Exercises   Exercises  Hand;Wrist      Additional Wrist Exercises   Sponges  11,10,14      Modalities   Modalities  Electrical Stimulation      Electrical Stimulation   Electrical Stimulation Location  left anterior forearm    Electrical Stimulation Action  Russian    Electrical Stimulation Parameters  51 mA CC    Electrical Stimulation Goals  Strength;Neuromuscular facilitation                OT Short Term Goals - 01/14/18 1215      OT SHORT TERM GOAL #1   Title  Pt will be provided with and educated on HEP for improved use of left hand as dominant during ADLs.     Time  4    Period  Weeks    Status  On-going      OT SHORT TERM GOAL #2   Title  Pt will improve left grip strength by 15# to improve ability to grasp and hold lightweight items in the left hand during ADLs.     Time  4    Period  Weeks    Status  On-going      OT SHORT TERM GOAL #3   Title  Pt will improve left pinch strength by 4# to improve ability to using writing utensils with with left hand.     Time  4    Period  Weeks    Status  On-going      OT SHORT TERM GOAL #4   Title  Pt will improve left wrist strength to 5/5 to improve ability to stabilize hand and any items held when working on cars.     Time  4    Period  Weeks    Status  On-going      OT SHORT TERM GOAL #5   Title  Patient will increase fine motor coordination as evidenced by completing the 9 hole peg test with his left hand in 1\' 10"  or less in order to increase use of nondominant left hand during ADLs.    Time  4    Period  Weeks    Status  On-going        OT Long Term Goals - 01/14/18 1215      OT LONG TERM GOAL #1   Title  Pt will achieve highest level of functioning using LUE as dominant during ADL and leisure tasks.     Time  8    Period  Weeks    Status  On-going      OT LONG TERM GOAL #2   Title  Pt will improve left grip strength by 30# to improve ability to hold golf clubs.     Time  8    Period  Weeks    Status  On-going      OT LONG TERM GOAL #3   Title  Pt will improve left pinch strength by 8#  to improve ability to hold small items such as keys in left hand without dropping.    Period  Weeks    Status  On-going      OT LONG TERM GOAL #4   Title  Patient will increase fine motor coordination as evidenced by completing the 9 hole peg test with his left hand in 45" or less in order to  increase use of nondominant left hand during ADLs.    Time  8    Period  Weeks    Status  On-going            Plan - 01/14/18 1111    Clinical Impression Statement  A: Utilized ES this session to activate use of thumb and pointer finger during functional task. Patient utilized ES while actively forming a full fist. He reported that his pointer finger was able to flex more after use of ES.     Plan  P: Continue with ES to inhibit greater thumb and index finger flexion that needed during functional grasp. Complete handgripper task.    Consulted and Agree with Plan of Care  Patient       Patient will benefit from skilled therapeutic intervention in order to improve the following deficits and impairments:  Decreased activity tolerance, Decreased knowledge of use of DME, Decreased strength, Pain, Decreased coordination, Impaired UE functional use  Visit Diagnosis: Other lack of coordination  Other symptoms and signs involving the musculoskeletal system    Problem List Patient Active Problem List   Diagnosis Date Noted  . Cervical spondylosis with myelopathy and radiculopathy 12/24/2017  . Coronary artery disease involving native coronary artery of native heart without angina pectoris 12/23/2017  . Hypercholesterolemia 12/23/2017  . Mild obesity 09/20/2016  . Edema of both legs 01/17/2013  . Long term current use of anticoagulant therapy 12/30/2012  . History of deep vein thrombosis (DVT) of lower extremity 12/26/2012  . Dyslipidemia 12/26/2012  . Coronary atherosclerosis without stenosis 12/26/2012   Ailene Ravel, OTR/L,CBIS  [phone]  01/14/2018, 12:16 PM  [facility] [address] [place], [place], [address] Phone: [phone]   Fax:  [phone]  Name: Casey Castillo MRN: [id number] Date of Birth: December 30, 1945

## 2018-01-15 ENCOUNTER — Encounter (HOSPITAL_COMMUNITY): Payer: PPO | Admitting: Occupational Therapy

## 2018-01-15 DIAGNOSIS — M4712 Other spondylosis with myelopathy, cervical region: Secondary | ICD-10-CM | POA: Diagnosis not present

## 2018-01-16 ENCOUNTER — Encounter (HOSPITAL_COMMUNITY): Payer: Self-pay | Admitting: Occupational Therapy

## 2018-01-16 ENCOUNTER — Ambulatory Visit (HOSPITAL_COMMUNITY): Payer: PPO | Admitting: Occupational Therapy

## 2018-01-16 DIAGNOSIS — R29898 Other symptoms and signs involving the musculoskeletal system: Secondary | ICD-10-CM

## 2018-01-16 DIAGNOSIS — R278 Other lack of coordination: Secondary | ICD-10-CM | POA: Diagnosis not present

## 2018-01-16 NOTE — Therapy (Signed)
[facility], [place], [address] Phone: [phone]   Fax:  [phone]  Occupational Therapy Treatment  Patient Details  Name: Casey Castillo MRN: [id number] Date of Birth: 03-18-1946 Referring Provider: Dr. Kristeen Miss   Encounter Date: 01/16/2018  OT End of Session - 01/16/18 1525    Visit Number  6    Number of Visits  16    Date for OT Re-Evaluation  03/01/18   mini reassessment on 01/28/18   Authorization Type  [employer]15 copay    Authorization Time Period  visits based on medical necessity    OT Start Time  1434    OT Stop Time  1514    OT Time Calculation (min)  40 min    Activity Tolerance  Patient tolerated treatment well    Behavior During Therapy  [facility] for tasks assessed/performed       Past Medical History:  Diagnosis Date  . Arthritis   . Cervical myelopathy with cervical radiculopathy   . Coronary artery disease    5%blockage on left anterior descending Dr. Sallyanne Kuster   . Diverticulitis    hx of  . Enlarged prostate    takes flomax  . GERD (gastroesophageal reflux disease)   . History of blood clots    november, 5 1995  . Hyperlipidemia    primary Dr. Selena Batten  . Kidney stone    hx of sees Dr. Chauncey Cruel. [place]  . Pneumonia    as a child  . PTSD (post-traumatic stress disorder)   . Seasonal allergies   . Urinary frequency    hx of     Past Surgical History:  Procedure Laterality Date  . ANTERIOR CERVICAL DECOMP/DISCECTOMY FUSION  01/28/2012   Procedure: ANTERIOR CERVICAL DECOMPRESSION/DISCECTOMY FUSION 2 LEVELS;  Surgeon: Kristeen Miss, MD;  Location: [facility];  Service: Neurosurgery;  Laterality: N/A;  Cervical six-seven, cervical seven-thoracic one Anterior cervical decompression/diskectomy/fusion  . ANTERIOR CERVICAL DECOMP/DISCECTOMY FUSION N/A 12/24/2017   Procedure: ANTERIOR CERVICAL DECOMPRESSION/DISCECTOMY FUSION CERVICAL FOUR-FIVE/CERVICAL FIVE-SIX TWO LEVELS;   Surgeon: Kristeen Miss, MD;  Location: [facility];  Service: Neurosurgery;  Laterality: N/A;  ANTERIOR CERVICAL DECOMPRESSION/DISCECTOMY FUSION CERVICAL FOUR-FIVE/CERVICAL FIVE-SIX TWO LEVELS  . CARDIAC CATHETERIZATION     09-06-11   . CARDIOVASCULAR STRESS TEST     2006 and 2008 at [facility] heart and vascular, 2011 myoview and echo  . COLONOSCOPY    . COLONOSCOPY N/A 08/30/2015   Procedure: COLONOSCOPY;  Surgeon: Aviva Signs, MD;  [facility];  Service: Gastroenterology;  Laterality: N/A;  . EYE SURGERY     Laser for torn retina  . HEMORRHOID SURGERY     sept 8, 2008  . KIDNEY STONE SURGERY     lithotripsy 1980's  . KNEE SURGERY     left 04-11-94 ( has had two surgeries to left knee)  . NASAL SINUS SURGERY     Jun 30 1998  . NM MYOVIEW LTD  07/20/2009   no ischemia  . PILONIDAL CYST / SINUS EXCISION     surgery 01-08-74  . SHOULDER SURGERY     right 12-20-2003  . TRIGGER FINGER RELEASE     03-21-98 right hand ring finger  . US ECHOCARDIOGRAPHY  07/20/2009   LA & RA mildly dilated,trace MR,TR.    There were no vitals filed for this visit.  Subjective Assessment - 01/16/18 1436    Subjective   S: They're going to send me for  a nerve conduction study.     Currently in Pain?  No/denies         [facility] OT Assessment - 01/16/18 1435      Assessment   Medical Diagnosis  LUE weakness      Precautions   Precautions  Cervical    Precaution Comments  Anterior Cervical Decompression of C4-5, C5-6 on 12/24/17                OT Treatments/Exercises (OP) - 01/16/18 1437      Exercises   Exercises  Hand;Wrist      Wrist Exercises   Wrist Flexion  Strengthening;15 reps    Bar Weights/Barbell (Wrist Flexion)  2 lbs    Wrist Extension  Strengthening;15 reps    Bar Weights/Barbell (Wrist Extension)  2 lbs    Wrist Radial Deviation  Strengthening;15 reps    Bar Weights/Barbell (Radial Deviation)  2 lbs    Wrist Ulnar Deviation  Strengthening;15 reps    Bar  Weights/Barbell (Ulnar Deviation)  2 lbs      Additional Wrist Exercises   Sponges  15, 14    Hand Gripper with Large Beads  all beads gripper at 7#     Hand Gripper with Medium Beads  all beads gripper at 7#-3 beads vertical, remainder with gripper horizontal       Hand Exercises   MCPJ Flexion  AROM   with NMES making gross all digit fist   Other Hand Exercises  pt used yellow clothespin with lateral pinch (positioned near base of index finger) to grasp and place 15 sponges into bucket, max difficulty with task due to weakness and fatigue.       Modalities   Modalities  Teacher, English as a foreign language Location  left anterior forearm    Clinical biochemist Parameters  30 mA CC    Electrical Stimulation Goals  Strength;Neuromuscular facilitation               OT Short Term Goals - 01/14/18 1215      OT SHORT TERM GOAL #1   Title  Pt will be provided with and educated on HEP for improved use of left hand as dominant during ADLs.     Time  4    Period  Weeks    Status  On-going      OT SHORT TERM GOAL #2   Title  Pt will improve left grip strength by 15# to improve ability to grasp and hold lightweight items in the left hand during ADLs.     Time  4    Period  Weeks    Status  On-going      OT SHORT TERM GOAL #3   Title  Pt will improve left pinch strength by 4# to improve ability to using writing utensils with with left hand.     Time  4    Period  Weeks    Status  On-going      OT SHORT TERM GOAL #4   Title  Pt will improve left wrist strength to 5/5 to improve ability to stabilize hand and any items held when working on cars.     Time  4    Period  Weeks    Status  On-going      OT SHORT TERM GOAL #5   Title  Patient will increase fine motor coordination as evidenced  by completing the 9 hole peg test with his left hand in 1\' 10"  or less in order to increase use of  nondominant left hand during ADLs.    Time  4    Period  Weeks    Status  On-going        OT Long Term Goals - 01/14/18 1215      OT LONG TERM GOAL #1   Title  Pt will achieve highest level of functioning using LUE as dominant during ADL and leisure tasks.     Time  8    Period  Weeks    Status  On-going      OT LONG TERM GOAL #2   Title  Pt will improve left grip strength by 30# to improve ability to hold golf clubs.     Time  8    Period  Weeks    Status  On-going      OT LONG TERM GOAL #3   Title  Pt will improve left pinch strength by 8# to improve ability to hold small items such as keys in left hand without dropping.    Period  Weeks    Status  On-going      OT LONG TERM GOAL #4   Title  Patient will increase fine motor coordination as evidenced by completing the 9 hole peg test with his left hand in 45" or less in order to increase use of nondominant left hand during ADLs.    Time  8    Period  Weeks    Status  On-going            Plan - 01/16/18 1525    Clinical Impression Statement  A: Continued with NMES this session working to activate thumb and pointer finger as well as stimulate intrinsic muscles in the palm. Resumed hand gripper task today, pt able to complete medium sized beads for first time today with max effort and occasional rest breaks for fatigue. Pt completed pinch task, has to use base of thumb and index fingers to squeeze clothespin.     Plan  P: Continue with NMES and handgripper activities working to complete all beads with gripper upright.        Patient will benefit from skilled therapeutic intervention in order to improve the following deficits and impairments:  Decreased activity tolerance, Decreased knowledge of use of DME, Decreased strength, Pain, Decreased coordination, Impaired UE functional use  Visit Diagnosis: Other lack of coordination  Other symptoms and signs involving the musculoskeletal system    Problem List Patient  Active Problem List   Diagnosis Date Noted  . Cervical spondylosis with myelopathy and radiculopathy 12/24/2017  . Coronary artery disease involving native coronary artery of native heart without angina pectoris 12/23/2017  . Hypercholesterolemia 12/23/2017  . Mild obesity 09/20/2016  . Edema of both legs 01/17/2013  . Long term current use of anticoagulant therapy 12/30/2012  . History of deep vein thrombosis (DVT) of lower extremity 12/26/2012  . Dyslipidemia 12/26/2012  . Coronary atherosclerosis without stenosis 12/26/2012   Guadelupe Sabin, OTR/L  [phone] 01/16/2018, 3:56 PM  [facility] [address] [place], [place], [address] Phone: [phone]   Fax:  [phone]  Name: CHAYSEN TILLMAN MRN: [id number] Date of Birth: Oct 08, 1945

## 2018-01-17 ENCOUNTER — Encounter (HOSPITAL_COMMUNITY): Payer: PPO | Admitting: Occupational Therapy

## 2018-01-20 ENCOUNTER — Ambulatory Visit (HOSPITAL_COMMUNITY): Payer: PPO | Admitting: Occupational Therapy

## 2018-01-20 ENCOUNTER — Encounter (HOSPITAL_COMMUNITY): Payer: Self-pay | Admitting: Occupational Therapy

## 2018-01-20 DIAGNOSIS — R278 Other lack of coordination: Secondary | ICD-10-CM | POA: Diagnosis not present

## 2018-01-20 DIAGNOSIS — R29898 Other symptoms and signs involving the musculoskeletal system: Secondary | ICD-10-CM

## 2018-01-20 NOTE — Therapy (Signed)
[place], [place], [address] Phone: [phone]   Fax:  [phone]  Occupational Therapy Treatment  Patient Details  Name: Casey Castillo MRN: [id number] Date of Birth: [birth year] Referring Provider: Dr. Kristeen Miss   Encounter Date: 01/20/2018  OT End of Session - 01/20/18 1117    Visit Number  7    Number of Visits  16    Date for OT Re-Evaluation  03/01/18   mini reassessment on 01/28/18   Authorization Type  [employer]-$15 copay    Authorization Time Period  visits based on medical necessity    OT Start Time  1027    OT Stop Time  1116    OT Time Calculation (min)  49 min    Activity Tolerance  Patient tolerated treatment well    Behavior During Therapy  [facility] for tasks assessed/performed       Past Medical History:  Diagnosis Date  . Arthritis   . Cervical myelopathy with cervical radiculopathy   . Coronary artery disease    5%blockage on left anterior descending Dr. Sallyanne Kuster   . Diverticulitis    hx of  . Enlarged prostate    takes flomax  . GERD (gastroesophageal reflux disease)   . History of blood clots    november, 5 1995  . Hyperlipidemia    primary Dr. Selena Batten  . Kidney stone    hx of sees Dr. Chauncey Cruel. [place] [facility]  . Pneumonia    as a child  . PTSD (post-traumatic stress disorder)   . Seasonal allergies   . Urinary frequency    hx of     Past Surgical History:  Procedure Laterality Date  . ANTERIOR CERVICAL DECOMP/DISCECTOMY FUSION  01/28/2012   Procedure: ANTERIOR CERVICAL DECOMPRESSION/DISCECTOMY FUSION 2 LEVELS;  Surgeon: Kristeen Miss, MD;  Location: [facility] [place] [facility];  Service: Neurosurgery;  Laterality: N/A;  Cervical six-seven, cervical seven-thoracic one Anterior cervical decompression/diskectomy/fusion  . ANTERIOR CERVICAL DECOMP/DISCECTOMY FUSION N/A 12/24/2017   Procedure: ANTERIOR CERVICAL DECOMPRESSION/DISCECTOMY FUSION CERVICAL FOUR-FIVE/CERVICAL FIVE-SIX TWO LEVELS;   Surgeon: Kristeen Miss, MD;  Location: [facility];  Service: Neurosurgery;  Laterality: N/A;  ANTERIOR CERVICAL DECOMPRESSION/DISCECTOMY FUSION CERVICAL FOUR-FIVE/CERVICAL FIVE-SIX TWO LEVELS  . CARDIAC CATHETERIZATION     09-06-11   . CARDIOVASCULAR STRESS TEST     2006 and 2008 at [facility] heart and vascular, 2011 myoview and echo  . COLONOSCOPY    . COLONOSCOPY N/A 08/30/2015   Procedure: COLONOSCOPY;  Surgeon: Aviva Signs, MD;  [facility];  Service: Gastroenterology;  Laterality: N/A;  . EYE SURGERY     Laser for torn retina  . HEMORRHOID SURGERY     sept 8, 2008  . KIDNEY STONE SURGERY     lithotripsy 1980's  . KNEE SURGERY     left 04-11-94 ( has had two surgeries to left knee)  . NASAL SINUS SURGERY     Jun 30 1998  . NM MYOVIEW LTD  07/20/2009   no ischemia  . PILONIDAL CYST / SINUS EXCISION     surgery 01-08-74  . SHOULDER SURGERY     right 12-20-2003  . TRIGGER FINGER RELEASE     03-21-98 right hand ring finger  . US ECHOCARDIOGRAPHY  07/20/2009   LA & RA mildly dilated,trace MR,TR.    There were no vitals filed for this visit.  Subjective Assessment - 01/20/18 1027    Subjective   S: I've been squeezing everything I can.  Currently in Pain?  No/denies         [facility] OT Assessment - 01/20/18 1027      Assessment   Medical Diagnosis  LUE weakness      Precautions   Precautions  Cervical    Precaution Comments  Anterior Cervical Decompression of C4-5, C5-6 on 12/24/17                OT Treatments/Exercises (OP) - 01/20/18 1028      Exercises   Exercises  Hand;Wrist      Wrist Exercises   Wrist Flexion  Theraband;15 reps    Theraband Level (Wrist Flexion)  Level 2 (Red)    Wrist Extension  Theraband;15 reps    Theraband Level (Wrist Extension)  Level 2 (Red)    Wrist Radial Deviation  Theraband;15 reps    Theraband Level (Radial Deviation)  Level 2 (Red)    Wrist Ulnar Deviation  Theraband;15 reps    Theraband Level (Ulnar Deviation)   Level 2 (Red)      Additional Wrist Exercises   Sponges  13, 14, 14    Hand Gripper with Large Beads  all beads gripper at 7#     Hand Gripper with Medium Beads  all beads gripper at 7#, gripper vertical, multiple rest breaks      Hand Exercises   MCPJ Flexion  AROM   with NMES making full fist   Digit Abduction/Adduction  Thumb abduction against red rubberband-OT providing mod assist for follow through with movement    Other Hand Exercises  Pt used pvc pipe to cut circles into yellow putty working on grip and wrist flexion/extension. Verbal cuing to not use compensatory movements with arm and shoulder. Max difficulty    Other Hand Exercises  tendon glides; 5X- max difficulty       Modalities   Modalities  Electrical engineer Stimulation Location  left anterior forearm    Electrical Stimulation Action  Engineer, petroleum Parameters  35 mA CC    Electrical Stimulation Goals  Strength;Neuromuscular facilitation      Fine Motor Coordination (Hand/Wrist)   Fine Motor Coordination  Manipulating coins    Manipulating coins  pt worked on palm to fingertip translation focusing on thumb flexion and opposition. Mod/max difficulty, 5 coins completed               OT Short Term Goals - 01/14/18 1215      OT SHORT TERM GOAL #1   Title  Pt will be provided with and educated on HEP for improved use of left hand as dominant during ADLs.     Time  4    Period  Weeks    Status  On-going      OT SHORT TERM GOAL #2   Title  Pt will improve left grip strength by 15# to improve ability to grasp and hold lightweight items in the left hand during ADLs.     Time  4    Period  Weeks    Status  On-going      OT SHORT TERM GOAL #3   Title  Pt will improve left pinch strength by 4# to improve ability to using writing utensils with with left hand.     Time  4    Period  Weeks    Status  On-going      OT SHORT TERM GOAL #4   Title  Pt will improve left wrist strength to 5/5 to improve ability to stabilize hand and any items held when working on cars.     Time  4    Period  Weeks    Status  On-going      OT SHORT TERM GOAL #5   Title  Patient will increase fine motor coordination as evidenced by completing the 9 hole peg test with his left hand in 1\' 10"  or less in order to increase use of nondominant left hand during ADLs.    Time  4    Period  Weeks    Status  On-going        OT Long Term Goals - 01/14/18 1215      OT LONG TERM GOAL #1   Title  Pt will achieve highest level of functioning using LUE as dominant during ADL and leisure tasks.     Time  8    Period  Weeks    Status  On-going      OT LONG TERM GOAL #2   Title  Pt will improve left grip strength by 30# to improve ability to hold golf clubs.     Time  8    Period  Weeks    Status  On-going      OT LONG TERM GOAL #3   Title  Pt will improve left pinch strength by 8# to improve ability to hold small items such as keys in left hand without dropping.    Period  Weeks    Status  On-going      OT LONG TERM GOAL #4   Title  Patient will increase fine motor coordination as evidenced by completing the 9 hole peg test with his left hand in 45" or less in order to increase use of nondominant left hand during ADLs.    Time  8    Period  Weeks    Status  On-going            Plan - 01/20/18 1118    Clinical Impression Statement  A: Continued with NMES this session with improvement in digit flexion and thumb opposition afterwards. Continued with wrist and forearm strengthening using red theraband today and focusing on reducing compensatory movements of left arm. Pt able to complete all medium beads today with gripper held upright, greater ease with large beads. Added pvc pipe activity with yellow theraputty with max difficulty, OT cuing to focus on grip combined with wrist flexion versus extension.     Plan  P: Mini-reassessment and progress note.  Continue with NMES, increase resistance to 11# for large beads with handgripper. Continue with red theraband strengthening       Patient will benefit from skilled therapeutic intervention in order to improve the following deficits and impairments:  Decreased activity tolerance, Decreased knowledge of use of DME, Decreased strength, Pain, Decreased coordination, Impaired UE functional use  Visit Diagnosis: Other lack of coordination  Other symptoms and signs involving the musculoskeletal system    Problem List Patient Active Problem List   Diagnosis Date Noted  . Cervical spondylosis with myelopathy and radiculopathy 12/24/2017  . Coronary artery disease involving native coronary artery of native heart without angina pectoris 12/23/2017  . Hypercholesterolemia 12/23/2017  . Mild obesity 09/20/2016  . Edema of both legs 01/17/2013  . Long term current use of anticoagulant therapy 12/30/2012  . History of deep vein thrombosis (DVT) of lower extremity 12/26/2012  . Dyslipidemia 12/26/2012  . Coronary atherosclerosis  without stenosis 12/26/2012   Guadelupe Sabin, OTR/L  [phone] 01/20/2018, 11:21 AM  [facility] [address] [place], [place], [address] Phone: [phone]   Fax:  [phone]  Name: Casey Castillo MRN: [id number] Date of Birth: 1945/12/11

## 2018-01-21 DIAGNOSIS — G5603 Carpal tunnel syndrome, bilateral upper limbs: Secondary | ICD-10-CM | POA: Diagnosis not present

## 2018-01-21 DIAGNOSIS — G540 Brachial plexus disorders: Secondary | ICD-10-CM | POA: Diagnosis not present

## 2018-01-22 ENCOUNTER — Ambulatory Visit (HOSPITAL_COMMUNITY): Payer: PPO | Admitting: Occupational Therapy

## 2018-01-22 ENCOUNTER — Encounter (HOSPITAL_COMMUNITY): Payer: Self-pay | Admitting: Occupational Therapy

## 2018-01-22 DIAGNOSIS — R278 Other lack of coordination: Secondary | ICD-10-CM

## 2018-01-22 DIAGNOSIS — R29898 Other symptoms and signs involving the musculoskeletal system: Secondary | ICD-10-CM

## 2018-01-22 NOTE — Therapy (Signed)
[facility] [address] [place], [place], [address] Phone: [phone]   Fax:  [phone]  Occupational Therapy Treatment  Patient Details  Name: Casey Castillo MRN: [id number] Date of Birth: 1945-09-21 Referring Provider: Dr. Kristeen Miss   Encounter Date: 01/22/2018  OT End of Session - 01/22/18 0950    Visit Number  8    Number of Visits  16    Date for OT Re-Evaluation  03/01/18   mini reassessment on 01/28/18   Authorization Type  [employer]-$15 copay    Authorization Time Period  visits based on medical necessity    OT Start Time  0902    OT Stop Time  0944    OT Time Calculation (min)  42 min    Activity Tolerance  Patient tolerated treatment well    Behavior During Therapy  [facility] for tasks assessed/performed       Past Medical History:  Diagnosis Date  . Arthritis   . Cervical myelopathy with cervical radiculopathy   . Coronary artery disease    5%blockage on left anterior descending Dr. Sallyanne Kuster   . Diverticulitis    hx of  . Enlarged prostate    takes flomax  . GERD (gastroesophageal reflux disease)   . History of blood clots    november, 5 1995  . Hyperlipidemia    primary Dr. Selena Batten  . Kidney stone    hx of sees Dr. Chauncey Cruel. [place] [facility]  . Pneumonia    as a child  . PTSD (post-traumatic stress disorder)   . Seasonal allergies   . Urinary frequency    hx of     Past Surgical History:  Procedure Laterality Date  . ANTERIOR CERVICAL DECOMP/DISCECTOMY FUSION  01/28/2012   Procedure: ANTERIOR CERVICAL DECOMPRESSION/DISCECTOMY FUSION 2 LEVELS;  Surgeon: Kristeen Miss, MD;  Location: [place] [facility];  Service: Neurosurgery;  Laterality: N/A;  Cervical six-seven, cervical seven-thoracic one Anterior cervical decompression/diskectomy/fusion  . ANTERIOR CERVICAL DECOMP/DISCECTOMY FUSION N/A 12/24/2017   Procedure: ANTERIOR CERVICAL DECOMPRESSION/DISCECTOMY FUSION CERVICAL FOUR-FIVE/CERVICAL FIVE-SIX TWO LEVELS;   Surgeon: Kristeen Miss, MD;  Location: [place];  Service: Neurosurgery;  Laterality: N/A;  ANTERIOR CERVICAL DECOMPRESSION/DISCECTOMY FUSION CERVICAL FOUR-FIVE/CERVICAL FIVE-SIX TWO LEVELS  . CARDIAC CATHETERIZATION     09-06-11   . CARDIOVASCULAR STRESS TEST     2006 and 2008 at [facility] heart and vascular, 2011 myoview and echo  . COLONOSCOPY    . COLONOSCOPY N/A 08/30/2015   Procedure: COLONOSCOPY;  Surgeon: Aviva Signs, MD;  [facility];  Service: Gastroenterology;  Laterality: N/A;  . EYE SURGERY     Laser for torn retina  . HEMORRHOID SURGERY     sept 8, 2008  . KIDNEY STONE SURGERY     lithotripsy 1980's  . KNEE SURGERY     left 04-11-94 ( has had two surgeries to left knee)  . NASAL SINUS SURGERY     Jun 30 1998  . NM MYOVIEW LTD  07/20/2009   no ischemia  . PILONIDAL CYST / SINUS EXCISION     surgery 01-08-74  . SHOULDER SURGERY     right 12-20-2003  . TRIGGER FINGER RELEASE     03-21-98 right hand ring finger  . US ECHOCARDIOGRAPHY  07/20/2009   LA & RA mildly dilated,trace MR,TR.    There were no vitals filed for this visit.  Subjective Assessment - 01/22/18 0950    Subjective   S: I went for that nerve conduction  study yesterday.     Currently in Pain?  No/denies         [facility] OT Assessment - 01/22/18 0904      Assessment   Medical Diagnosis  LUE weakness      Precautions   Precautions  Cervical    Precaution Comments  Anterior Cervical Decompression of C4-5, C5-6 on 12/24/17                OT Treatments/Exercises (OP) - 01/22/18 0906      Exercises   Exercises  Hand;Wrist;Elbow      Elbow Exercises   Forearm Supination  Theraband;15 reps    Theraband Level (Supination)  Level 2 (Red)    Forearm Pronation  Theraband;15 reps    Theraband Level (Pronation)  Level 2 (Red)      Additional Elbow Exercises   Hand Gripper with Large Beads  all beads gripper at 7#-attempted with 11#, pt unable to complete    Hand Gripper with Medium  Beads  all beads gripper at 7#, gripper vertical, multiple rest breaks      Wrist Exercises   Wrist Flexion  Theraband;15 reps    Theraband Level (Wrist Flexion)  Level 2 (Red)    Wrist Extension  Theraband;15 reps    Theraband Level (Wrist Extension)  Level 2 (Red)    Wrist Radial Deviation  Theraband;15 reps    Theraband Level (Radial Deviation)  Level 2 (Red)    Wrist Ulnar Deviation  Theraband;15 reps    Theraband Level (Ulnar Deviation)  Level 2 (Red)      Hand Exercises   MCPJ Flexion  AROM;10 reps   squeezing towel during NMES   Other Hand Exercises  pt used yellow clothespin with lateral pinch (positioned near base of index finger) to grasp and place 15 sponges into bucket, max difficulty with task due to weakness and fatigue. Attempted with 3 point pinch however pt unable to complete      Modalities   Modalities  Teacher, English as a foreign language Location  left anterior forearm    Electrical Stimulation Action  Engineer, petroleum Parameters  36 mA CC    Electrical Stimulation Goals  Strength;Neuromuscular facilitation               OT Short Term Goals - 01/14/18 1215      OT SHORT TERM GOAL #1   Title  Pt will be provided with and educated on HEP for improved use of left hand as dominant during ADLs.     Time  4    Period  Weeks    Status  On-going      OT SHORT TERM GOAL #2   Title  Pt will improve left grip strength by 15# to improve ability to grasp and hold lightweight items in the left hand during ADLs.     Time  4    Period  Weeks    Status  On-going      OT SHORT TERM GOAL #3   Title  Pt will improve left pinch strength by 4# to improve ability to using writing utensils with with left hand.     Time  4    Period  Weeks    Status  On-going      OT SHORT TERM GOAL #4   Title  Pt will improve left wrist strength to 5/5 to improve ability to stabilize hand and any  items held when working on  cars.     Time  4    Period  Weeks    Status  On-going      OT SHORT TERM GOAL #5   Title  Patient will increase fine motor coordination as evidenced by completing the 9 hole peg test with his left hand in 1\' 10"  or less in order to increase use of nondominant left hand during ADLs.    Time  4    Period  Weeks    Status  On-going        OT Long Term Goals - 01/14/18 1215      OT LONG TERM GOAL #1   Title  Pt will achieve highest level of functioning using LUE as dominant during ADL and leisure tasks.     Time  8    Period  Weeks    Status  On-going      OT LONG TERM GOAL #2   Title  Pt will improve left grip strength by 30# to improve ability to hold golf clubs.     Time  8    Period  Weeks    Status  On-going      OT LONG TERM GOAL #3   Title  Pt will improve left pinch strength by 8# to improve ability to hold small items such as keys in left hand without dropping.    Period  Weeks    Status  On-going      OT LONG TERM GOAL #4   Title  Patient will increase fine motor coordination as evidenced by completing the 9 hole peg test with his left hand in 45" or less in order to increase use of nondominant left hand during ADLs.    Time  8    Period  Weeks    Status  On-going            Plan - 01/22/18 0951    Clinical Impression Statement  A: Continued with NMES this session continuing to facilitate digit flexion. Pt reporting he is practicing squeezing objects at home and it is getting easier to use his hand during ADLs. Attempted to increase hand gripper for large beads, however pt unable to complete. Continued with wrist and forearm strengthening, grip strengthening, and incorporated pinch strengthening activities today, max difficulty with lateral pinch task and unable to complete 3 point pinch task. Verbal cuing for form to reduce compensatory movements.     Plan  P: Mini-reassessment and progress note.        Patient will benefit from skilled therapeutic  intervention in order to improve the following deficits and impairments:  Decreased activity tolerance, Decreased knowledge of use of DME, Decreased strength, Pain, Decreased coordination, Impaired UE functional use  Visit Diagnosis: Other lack of coordination  Other symptoms and signs involving the musculoskeletal system    Problem List Patient Active Problem List   Diagnosis Date Noted  . Cervical spondylosis with myelopathy and radiculopathy 12/24/2017  . Coronary artery disease involving native coronary artery of native heart without angina pectoris 12/23/2017  . Hypercholesterolemia 12/23/2017  . Mild obesity 09/20/2016  . Edema of both legs 01/17/2013  . Long term current use of anticoagulant therapy 12/30/2012  . History of deep vein thrombosis (DVT) of lower extremity 12/26/2012  . Dyslipidemia 12/26/2012  . Coronary atherosclerosis without stenosis 12/26/2012   Guadelupe Sabin, OTR/L  [phone] 01/22/2018, 9:54 AM  [facility]  [place], [place], [address] Phone: [phone]   Fax:  [phone]  Name: RASHIDI LOH MRN: [id number] Date of Birth: 03/28/1946

## 2018-01-28 ENCOUNTER — Encounter (HOSPITAL_COMMUNITY): Payer: Self-pay | Admitting: Occupational Therapy

## 2018-01-28 ENCOUNTER — Ambulatory Visit (HOSPITAL_COMMUNITY): Payer: PPO | Admitting: Occupational Therapy

## 2018-01-28 DIAGNOSIS — R278 Other lack of coordination: Secondary | ICD-10-CM

## 2018-01-28 DIAGNOSIS — R29898 Other symptoms and signs involving the musculoskeletal system: Secondary | ICD-10-CM

## 2018-01-28 NOTE — Therapy (Signed)
[place], [place], [address] Phone: [phone]   Fax:  [phone]  Occupational Therapy Treatment (mini-reassessment)  Patient Details  Name: Casey Castillo MRN: [id number] Date of Birth: 14-Oct-1945 Referring Provider: Dr. Kristeen Miss   Progress Note Reporting Period 12/31/2017 to 01/28/2018  See note below for Objective Data and Assessment of Progress/Goals.       Encounter Date: 01/28/2018  OT End of Session - 01/28/18 1003    Visit Number  9    Number of Visits  16    Date for OT Re-Evaluation  03/01/18    Authorization Type  [employer]15 copay    Authorization Time Period  visits based on medical necessity    OT Start Time  0935    OT Stop Time  1022    OT Time Calculation (min)  47 min    Activity Tolerance  Patient tolerated treatment well    Behavior During Therapy  [facility] for tasks assessed/performed       Past Medical History:  Diagnosis Date  . Arthritis   . Cervical myelopathy with cervical radiculopathy   . Coronary artery disease    5%blockage on left anterior descending Dr. Sallyanne Kuster   . Diverticulitis    hx of  . Enlarged prostate    takes flomax  . GERD (gastroesophageal reflux disease)   . History of blood clots    november, 5 1995  . Hyperlipidemia    primary Dr. Selena Batten  . Kidney stone    hx of sees Dr. Chauncey Cruel. [place]  . Pneumonia    as a child  . PTSD (post-traumatic stress disorder)   . Seasonal allergies   . Urinary frequency    hx of     Past Surgical History:  Procedure Laterality Date  . ANTERIOR CERVICAL DECOMP/DISCECTOMY FUSION  01/28/2012   Procedure: ANTERIOR CERVICAL DECOMPRESSION/DISCECTOMY FUSION 2 LEVELS;  Surgeon: Kristeen Miss, MD;  Location: [place] [facility];  Service: Neurosurgery;  Laterality: N/A;  Cervical six-seven, cervical seven-thoracic one Anterior cervical decompression/diskectomy/fusion  . ANTERIOR CERVICAL DECOMP/DISCECTOMY FUSION N/A  12/24/2017   Procedure: ANTERIOR CERVICAL DECOMPRESSION/DISCECTOMY FUSION CERVICAL FOUR-FIVE/CERVICAL FIVE-SIX TWO LEVELS;  Surgeon: Kristeen Miss, MD;  Location: [place];  Service: Neurosurgery;  Laterality: N/A;  ANTERIOR CERVICAL DECOMPRESSION/DISCECTOMY FUSION CERVICAL FOUR-FIVE/CERVICAL FIVE-SIX TWO LEVELS  . CARDIAC CATHETERIZATION     09-06-11   . CARDIOVASCULAR STRESS TEST     2006 and 2008 at [facility] heart and vascular, 2011 myoview and echo  . COLONOSCOPY    . COLONOSCOPY N/A 08/30/2015   Procedure: COLONOSCOPY;  Surgeon: Aviva Signs, MD;  [facility];  Service: Gastroenterology;  Laterality: N/A;  . EYE SURGERY     Laser for torn retina  . HEMORRHOID SURGERY     sept 8, 2008  . KIDNEY STONE SURGERY     lithotripsy 1980's  . KNEE SURGERY     left 04-11-94 ( has had two surgeries to left knee)  . NASAL SINUS SURGERY     Jun 30 1998  . NM MYOVIEW LTD  07/20/2009   no ischemia  . PILONIDAL CYST / SINUS EXCISION     surgery 01-08-74  . SHOULDER SURGERY     right 12-20-2003  . TRIGGER FINGER RELEASE     03-21-98 right hand ring finger  . US ECHOCARDIOGRAPHY  07/20/2009   LA & RA mildly dilated,trace MR,TR.    There were no vitals filed for this  visit.  Subjective Assessment - 01/28/18 0929    Subjective   S: I tried to use this hand to squeeze the handle of the pressure washer.     Currently in Pain?  No/denies         [facility] OT Assessment - 01/28/18 0928      Assessment   Medical Diagnosis  LUE weakness      Precautions   Precautions  Cervical    Precaution Comments  Anterior Cervical Decompression of C4-5, C5-6 on 12/24/17       Coordination   Left 9 Hole Peg Test  '1\' 09"'    '1\' 15"'  previous     Strength   Left Wrist Flexion  4+/5   4/5 previous   Left Wrist Extension  5/5   4+/5 previous   Left Wrist Radial Deviation  4/5   same as previous   Left Wrist Ulnar Deviation  4/5   same as previous   Left Hand Grip (lbs)  7   2 previous   Left Hand  Lateral Pinch  4 lbs   2 previous   Left Hand 3 Point Pinch  3 lbs   same as previous              OT Treatments/Exercises (OP) - 01/28/18 0940      Exercises   Exercises  Hand;Wrist;Elbow      Elbow Exercises   Forearm Supination  Theraband;15 reps    Theraband Level (Supination)  Level 2 (Red)    Forearm Pronation  Theraband;15 reps    Theraband Level (Pronation)  Level 2 (Red)      Additional Elbow Exercises   Hand Gripper with Large Beads  all beads gripper at 7#-attempted with 11#, pt unable to complete    Hand Gripper with Medium Beads  all beads gripper at 7#, gripper vertical, multiple rest breaks      Wrist Exercises   Wrist Flexion  Theraband;15 reps    Theraband Level (Wrist Flexion)  Level 2 (Red)    Wrist Extension  Theraband;15 reps    Theraband Level (Wrist Extension)  Level 2 (Red)    Wrist Radial Deviation  Theraband;15 reps    Theraband Level (Radial Deviation)  Level 2 (Red)    Wrist Ulnar Deviation  Theraband;15 reps    Theraband Level (Ulnar Deviation)  Level 2 (Red)      Hand Exercises   Other Hand Exercises  Pt used yellow clothespin with 3 point pinch to placed along top hoizontal bar on pinch tree. Pt then used red clothespin and lateral pinch to place pins along bottom and middle bar on pinch tree.       Modalities   Modalities  Teacher, English as a foreign language Location  left anterior forearm    Clinical biochemist Parameters  33 mA CC    Electrical Stimulation Goals  Strength;Neuromuscular facilitation               OT Short Term Goals - 01/28/18 1002      OT SHORT TERM GOAL #1   Title  Pt will be provided with and educated on HEP for improved use of left hand as dominant during ADLs.     Time  4    Period  Weeks    Status  On-going      OT SHORT TERM GOAL #2   Title  Pt  will improve left grip strength by 15# to improve ability to  grasp and hold lightweight items in the left hand during ADLs.     Time  4    Period  Weeks    Status  On-going      OT SHORT TERM GOAL #3   Title  Pt will improve left pinch strength by 4# to improve ability to using writing utensils with with left hand.     Time  4    Period  Weeks    Status  On-going      OT SHORT TERM GOAL #4   Title  Pt will improve left wrist strength to 5/5 to improve ability to stabilize hand and any items held when working on cars.     Time  4    Period  Weeks    Status  Partially Met      OT SHORT TERM GOAL #5   Title  Patient will increase fine motor coordination as evidenced by completing the 9 hole peg test with his left hand in '1\' 10"'  or less in order to increase use of nondominant left hand during ADLs.    Time  4    Period  Weeks    Status  Achieved        OT Long Term Goals - 01/14/18 1215      OT LONG TERM GOAL #1   Title  Pt will achieve highest level of functioning using LUE as dominant during ADL and leisure tasks.     Time  8    Period  Weeks    Status  On-going      OT LONG TERM GOAL #2   Title  Pt will improve left grip strength by 30# to improve ability to hold golf clubs.     Time  8    Period  Weeks    Status  On-going      OT LONG TERM GOAL #3   Title  Pt will improve left pinch strength by 8# to improve ability to hold small items such as keys in left hand without dropping.    Period  Weeks    Status  On-going      OT LONG TERM GOAL #4   Title  Patient will increase fine motor coordination as evidenced by completing the 9 hole peg test with his left hand in 45" or less in order to increase use of nondominant left hand during ADLs.    Time  8    Period  Weeks    Status  On-going            Plan - 01/28/18 1027    Clinical Impression Statement  A: Reassessment completed this session, pt has met 1 STG and partially met an additional STG. Pt is slowly improving grip/pinch strength and mobility of left hand, continues  to have difficulty isolating thumb and index finger for functional use. Continued with hand gripper activity and red theraband exercises this session, cuing for form and limiting compensatory strategies. Pt with slight improvement in keeping wrist neutral during NMES and wrist strengthening, significant compensatory movements during pinch activity. Verbal cuing throughout session for form.     Plan  P: progress to green theraband for wrist/forearm strengthening, focus on pinch tasks during coordination activity       Patient will benefit from skilled therapeutic intervention in order to improve the following deficits and impairments:  Decreased activity tolerance, Decreased knowledge of use of  DME, Decreased strength, Pain, Decreased coordination, Impaired UE functional use  Visit Diagnosis: Other lack of coordination  Other symptoms and signs involving the musculoskeletal system    Problem List Patient Active Problem List   Diagnosis Date Noted  . Cervical spondylosis with myelopathy and radiculopathy 12/24/2017  . Coronary artery disease involving native coronary artery of native heart without angina pectoris 12/23/2017  . Hypercholesterolemia 12/23/2017  . Mild obesity 09/20/2016  . Edema of both legs 01/17/2013  . Long term current use of anticoagulant therapy 12/30/2012  . History of deep vein thrombosis (DVT) of lower extremity 12/26/2012  . Dyslipidemia 12/26/2012  . Coronary atherosclerosis without stenosis 12/26/2012   Guadelupe Sabin, OTR/L  [phone] 01/28/2018, 12:23 PM  [facility] [address] [place], [place], [address] Phone: [phone]   Fax:  [phone]  Name: Casey Castillo MRN: [id number] Date of Birth: 03-27-1946

## 2018-01-30 ENCOUNTER — Encounter (HOSPITAL_COMMUNITY): Payer: Self-pay

## 2018-01-30 ENCOUNTER — Ambulatory Visit (HOSPITAL_COMMUNITY): Payer: PPO

## 2018-01-30 DIAGNOSIS — R278 Other lack of coordination: Secondary | ICD-10-CM | POA: Diagnosis not present

## 2018-01-30 DIAGNOSIS — R29898 Other symptoms and signs involving the musculoskeletal system: Secondary | ICD-10-CM

## 2018-01-30 NOTE — Therapy (Signed)
[place], [place], [address] Phone: [phone]   Fax:  [phone]  Occupational Therapy Treatment  Patient Details  Name: Casey Castillo MRN: [id number] Date of Birth: 01/07/1946 Referring Provider: Dr. Kristeen Miss   Encounter Date: 01/30/2018  OT End of Session - 01/30/18 1128    Visit Number  10    Number of Visits  16    Date for OT Re-Evaluation  03/01/18    Authorization Type  [employer]-$15 copay    Authorization Time Period  visits based on medical necessity    OT Start Time  1035    OT Stop Time  1115    OT Time Calculation (min)  40 min    Activity Tolerance  Patient tolerated treatment well    Behavior During Therapy  [facility] for tasks assessed/performed       Past Medical History:  Diagnosis Date  . Arthritis   . Cervical myelopathy with cervical radiculopathy   . Coronary artery disease    5%blockage on left anterior descending Dr. Sallyanne Kuster   . Diverticulitis    hx of  . Enlarged prostate    takes flomax  . GERD (gastroesophageal reflux disease)   . History of blood clots    november, 5 1995  . Hyperlipidemia    primary Dr. Selena Batten  . Kidney stone    hx of sees Dr. Chauncey Cruel. [place] [facility]  . Pneumonia    as a child  . PTSD (post-traumatic stress disorder)   . Seasonal allergies   . Urinary frequency    hx of     Past Surgical History:  Procedure Laterality Date  . ANTERIOR CERVICAL DECOMP/DISCECTOMY FUSION  01/28/2012   Procedure: ANTERIOR CERVICAL DECOMPRESSION/DISCECTOMY FUSION 2 LEVELS;  Surgeon: Kristeen Miss, MD;  Location: [place] [facility];  Service: Neurosurgery;  Laterality: N/A;  Cervical six-seven, cervical seven-thoracic one Anterior cervical decompression/diskectomy/fusion  . ANTERIOR CERVICAL DECOMP/DISCECTOMY FUSION N/A 12/24/2017   Procedure: ANTERIOR CERVICAL DECOMPRESSION/DISCECTOMY FUSION CERVICAL FOUR-FIVE/CERVICAL FIVE-SIX TWO LEVELS;  Surgeon: Kristeen Miss, MD;   Location: [place];  Service: Neurosurgery;  Laterality: N/A;  ANTERIOR CERVICAL DECOMPRESSION/DISCECTOMY FUSION CERVICAL FOUR-FIVE/CERVICAL FIVE-SIX TWO LEVELS  . CARDIAC CATHETERIZATION     09-06-11   . CARDIOVASCULAR STRESS TEST     2006 and 2008 at [facility] heart and vascular, 2011 myoview and echo  . COLONOSCOPY    . COLONOSCOPY N/A 08/30/2015   Procedure: COLONOSCOPY;  Surgeon: Aviva Signs, MD;  [facility];  Service: Gastroenterology;  Laterality: N/A;  . EYE SURGERY     [facility] for torn retina  . HEMORRHOID SURGERY     sept 8, 2008  . KIDNEY STONE SURGERY     lithotripsy 1980's  . KNEE SURGERY     left 04-11-94 ( has had two surgeries to left knee)  . NASAL SINUS SURGERY     Jun 30 1998  . NM MYOVIEW LTD  07/20/2009   no ischemia  . PILONIDAL CYST / SINUS EXCISION     surgery 01-08-74  . SHOULDER SURGERY     right 12-20-2003  . TRIGGER FINGER RELEASE     03-21-98 right hand ring finger  . US ECHOCARDIOGRAPHY  07/20/2009   LA & RA mildly dilated,trace MR,TR.    There were no vitals filed for this visit.  Subjective Assessment - 01/30/18 1130    Subjective   S: My hand is making progress. The thumb and pointer finger is  just going slower.     Currently in Pain?  No/denies         [facility] OT Assessment - 01/30/18 1131      Assessment   Medical Diagnosis  LUE weakness      Precautions   Precautions  Cervical    Precaution Comments  Anterior Cervical Decompression of C4-5, C5-6 on 12/24/17                OT Treatments/Exercises (OP) - 01/30/18 1103      Exercises   Exercises  Hand;Wrist;Elbow      Elbow Exercises   Forearm Supination  Theraband;10 reps    Theraband Level (Supination)  Level 3 (Green)    Forearm Pronation  Theraband;10 reps    Theraband Level (Pronation)  Level 3 (Green)      Wrist Exercises   Wrist Flexion  Theraband;10 reps    Theraband Level (Wrist Flexion)  Level 3 (Green)    Wrist Extension  Theraband;10 reps     Theraband Level (Wrist Extension)  Level 3 (Green)    Wrist Radial Deviation  Theraband;10 reps    Theraband Level (Radial Deviation)  Level 3 (Green)    Wrist Ulnar Deviation  Theraband;10 reps    Theraband Level (Ulnar Deviation)  Level 3 (Green)      Neurological Re-education Exercises   Other Grasp and Release Exercises   While using ES, patient grabbed large pegs during on ramp with pointer and thumb finger and placed peg in board during off ramp.       Modalities   Modalities  Teacher, English as a foreign language Location  left anterior forearm    Clinical biochemist Parameters  109m CC    Electrical Stimulation Goals  Strength;Neuromuscular facilitation               OT Short Term Goals - 01/28/18 1002      OT SHORT TERM GOAL #1   Title  Pt will be provided with and educated on HEP for improved use of left hand as dominant during ADLs.     Time  4    Period  Weeks    Status  On-going      OT SHORT TERM GOAL #2   Title  Pt will improve left grip strength by 15# to improve ability to grasp and hold lightweight items in the left hand during ADLs.     Time  4    Period  Weeks    Status  On-going      OT SHORT TERM GOAL #3   Title  Pt will improve left pinch strength by 4# to improve ability to using writing utensils with with left hand.     Time  4    Period  Weeks    Status  On-going      OT SHORT TERM GOAL #4   Title  Pt will improve left wrist strength to 5/5 to improve ability to stabilize hand and any items held when working on cars.     Time  4    Period  Weeks    Status  Partially Met      OT SHORT TERM GOAL #5   Title  Patient will increase fine motor coordination as evidenced by completing the 9 hole peg test with his left hand in '1\' 10"'  or less in order to increase use of nondominant  left hand during ADLs.    Time  4    Period  Weeks    Status  Achieved         OT Long Term Goals - 01/14/18 1215      OT LONG TERM GOAL #1   Title  Pt will achieve highest level of functioning using LUE as dominant during ADL and leisure tasks.     Time  8    Period  Weeks    Status  On-going      OT LONG TERM GOAL #2   Title  Pt will improve left grip strength by 30# to improve ability to hold golf clubs.     Time  8    Period  Weeks    Status  On-going      OT LONG TERM GOAL #3   Title  Pt will improve left pinch strength by 8# to improve ability to hold small items such as keys in left hand without dropping.    Period  Weeks    Status  On-going      OT LONG TERM GOAL #4   Title  Patient will increase fine motor coordination as evidenced by completing the 9 hole peg test with his left hand in 45" or less in order to increase use of nondominant left hand during ADLs.    Time  8    Period  Weeks    Status  On-going            Plan - 01/30/18 1131    Clinical Impression Statement  A: Progressed to green theraband for forearm and wrist strengthening. Patient had moderate difficulty with slow controlled movement and rest breaks between sets. VC for form and technique as needed.     Plan  P: Continue with green theraband for wrist/forearm strengthening. Focus on 2 point pinch during coordination task.     Consulted and Agree with Plan of Care  Patient       Patient will benefit from skilled therapeutic intervention in order to improve the following deficits and impairments:  Decreased activity tolerance, Decreased knowledge of use of DME, Decreased strength, Pain, Decreased coordination, Impaired UE functional use  Visit Diagnosis: Other lack of coordination  Other symptoms and signs involving the musculoskeletal system    Problem List Patient Active Problem List   Diagnosis Date Noted  . Cervical spondylosis with myelopathy and radiculopathy 12/24/2017  . Coronary artery disease involving native coronary artery of native heart without  angina pectoris 12/23/2017  . Hypercholesterolemia 12/23/2017  . Mild obesity 09/20/2016  . Edema of both legs 01/17/2013  . Long term current use of anticoagulant therapy 12/30/2012  . History of deep vein thrombosis (DVT) of lower extremity 12/26/2012  . Dyslipidemia 12/26/2012  . Coronary atherosclerosis without stenosis 12/26/2012   Ailene Ravel, OTR/L,CBIS  [phone]  01/30/2018, 11:34 AM  [facility] [address] [place], [place], [address] Phone: [phone]   Fax:  [phone]  Name: Casey Castillo MRN: [id number] Date of Birth: [birth year]

## 2018-02-11 ENCOUNTER — Ambulatory Visit (HOSPITAL_COMMUNITY): Payer: PPO | Attending: Neurological Surgery | Admitting: Occupational Therapy

## 2018-02-11 ENCOUNTER — Encounter (HOSPITAL_COMMUNITY): Payer: Self-pay | Admitting: Occupational Therapy

## 2018-02-11 DIAGNOSIS — R278 Other lack of coordination: Secondary | ICD-10-CM | POA: Diagnosis not present

## 2018-02-11 DIAGNOSIS — R29898 Other symptoms and signs involving the musculoskeletal system: Secondary | ICD-10-CM | POA: Insufficient documentation

## 2018-02-11 DIAGNOSIS — G540 Brachial plexus disorders: Secondary | ICD-10-CM | POA: Diagnosis not present

## 2018-02-11 DIAGNOSIS — M6281 Muscle weakness (generalized): Secondary | ICD-10-CM | POA: Diagnosis not present

## 2018-02-11 NOTE — Therapy (Signed)
[facility] [address] [place], [place], [address] Phone: [phone]   Fax:  [phone]  Occupational Therapy Treatment  Patient Details  Name: Casey Castillo MRN: [id number] Date of Birth: 21-Jun-1945 Referring Provider: Dr. Kristeen Miss   Encounter Date: 02/11/2018  OT End of Session - 02/11/18 1027    Visit Number  11    Number of Visits  16    Date for OT Re-Evaluation  03/01/18    Authorization Type  [employer]-$15 copay    Authorization Time Period  visits based on medical necessity    OT Start Time  0944    OT Stop Time  1026    OT Time Calculation (min)  42 min    Activity Tolerance  Patient tolerated treatment well    Behavior During Therapy  [facility] for tasks assessed/performed       Past Medical History:  Diagnosis Date  . Arthritis   . Cervical myelopathy with cervical radiculopathy   . Coronary artery disease    5%blockage on left anterior descending Dr. Sallyanne Kuster   . Diverticulitis    hx of  . Enlarged prostate    takes flomax  . GERD (gastroesophageal reflux disease)   . History of blood clots    november, 5 1995  . Hyperlipidemia    primary Dr. Selena Batten  . Kidney stone    hx of sees Dr. Chauncey Cruel. [place] [facility]  . Pneumonia    as a child  . PTSD (post-traumatic stress disorder)   . Seasonal allergies   . Urinary frequency    hx of     Past Surgical History:  Procedure Laterality Date  . ANTERIOR CERVICAL DECOMP/DISCECTOMY FUSION  01/28/2012   Procedure: ANTERIOR CERVICAL DECOMPRESSION/DISCECTOMY FUSION 2 LEVELS;  Surgeon: Kristeen Miss, MD;  Location: [place] [facility];  Service: Neurosurgery;  Laterality: N/A;  Cervical six-seven, cervical seven-thoracic one Anterior cervical decompression/diskectomy/fusion  . ANTERIOR CERVICAL DECOMP/DISCECTOMY FUSION N/A 12/24/2017   Procedure: ANTERIOR CERVICAL DECOMPRESSION/DISCECTOMY FUSION CERVICAL FOUR-FIVE/CERVICAL FIVE-SIX TWO LEVELS;  Surgeon: Kristeen Miss, MD;   Location: [facility];  Service: Neurosurgery;  Laterality: N/A;  ANTERIOR CERVICAL DECOMPRESSION/DISCECTOMY FUSION CERVICAL FOUR-FIVE/CERVICAL FIVE-SIX TWO LEVELS  . CARDIAC CATHETERIZATION     09-06-11   . CARDIOVASCULAR STRESS TEST     2006 and 2008 at [facility], 2011 myoview and echo  . COLONOSCOPY    . COLONOSCOPY N/A 08/30/2015   Procedure: COLONOSCOPY;  Surgeon: Aviva Signs, MD;  [facility];  Service: Gastroenterology;  Laterality: N/A;  . EYE SURGERY     Laser for torn retina  . HEMORRHOID SURGERY     sept 8, 2008  . KIDNEY STONE SURGERY     lithotripsy 1980's  . KNEE SURGERY     left 04-11-94 ( has had two surgeries to left knee)  . NASAL SINUS SURGERY     Jun 30 1998  . NM MYOVIEW LTD  07/20/2009   no ischemia  . PILONIDAL CYST / SINUS EXCISION     surgery 01-08-74  . SHOULDER SURGERY     right 12-20-2003  . TRIGGER FINGER RELEASE     03-21-98 right hand ring finger  . US ECHOCARDIOGRAPHY  07/20/2009   LA & RA mildly dilated,trace MR,TR.    There were no vitals filed for this visit.  Subjective Assessment - 02/11/18 0945    Subjective   S: I've been trying to stretch this thumb out.  Currently in Pain?  No/denies         [facility] OT Assessment - 02/11/18 0945      Assessment   Medical Diagnosis  LUE weakness      Precautions   Precautions  Cervical    Precaution Comments  Anterior Cervical Decompression of C4-5, C5-6 on 12/24/17                OT Treatments/Exercises (OP) - 02/11/18 0946      Exercises   Exercises  Hand;Wrist;Elbow      Elbow Exercises   Forearm Supination  Theraband;10 reps    Theraband Level (Supination)  Level 3 (Green)    Forearm Pronation  Theraband;10 reps    Theraband Level (Pronation)  Level 3 (Green)      Wrist Exercises   Wrist Flexion  Theraband;10 reps    Theraband Level (Wrist Flexion)  Level 3 (Green)    Wrist Extension  Theraband;10 reps    Theraband Level (Wrist Extension)  Level 3  (Green)    Wrist Radial Deviation  Theraband;10 reps    Theraband Level (Radial Deviation)  Level 3 (Green)    Wrist Ulnar Deviation  Theraband;10 reps    Theraband Level (Ulnar Deviation)  Level 3 (Green)      Fine Motor Coordination (Hand/Wrist)   Fine Motor Coordination  Small Pegboard    Small Pegboard  Pt used tweezers to place small pegs into pegboard with mod difficulty. Pt alternated between lateral pinch and 3 point pinch for tweezer use, max difficulty with 3 point pinch. Increased time required to complete. Pt then removed pegs using fingertips and 2 point pinch (thumb and index fingers). Attempted to place using 2 point pinch without tweezers however pt unable to complete.                OT Short Term Goals - 01/28/18 1002      OT SHORT TERM GOAL #1   Title  Pt will be provided with and educated on HEP for improved use of left hand as dominant during ADLs.     Time  4    Period  Weeks    Status  On-going      OT SHORT TERM GOAL #2   Title  Pt will improve left grip strength by 15# to improve ability to grasp and hold lightweight items in the left hand during ADLs.     Time  4    Period  Weeks    Status  On-going      OT SHORT TERM GOAL #3   Title  Pt will improve left pinch strength by 4# to improve ability to using writing utensils with with left hand.     Time  4    Period  Weeks    Status  On-going      OT SHORT TERM GOAL #4   Title  Pt will improve left wrist strength to 5/5 to improve ability to stabilize hand and any items held when working on cars.     Time  4    Period  Weeks    Status  Partially Met      OT SHORT TERM GOAL #5   Title  Patient will increase fine motor coordination as evidenced by completing the 9 hole peg test with his left hand in '1\' 10"'  or less in order to increase use of nondominant left hand during ADLs.    Time  4    Period  Weeks    Status  Achieved        OT Long Term Goals - 01/14/18 1215      OT LONG TERM GOAL #1    Title  Pt will achieve highest level of functioning using LUE as dominant during ADL and leisure tasks.     Time  8    Period  Weeks    Status  On-going      OT LONG TERM GOAL #2   Title  Pt will improve left grip strength by 30# to improve ability to hold golf clubs.     Time  8    Period  Weeks    Status  On-going      OT LONG TERM GOAL #3   Title  Pt will improve left pinch strength by 8# to improve ability to hold small items such as keys in left hand without dropping.    Period  Weeks    Status  On-going      OT LONG TERM GOAL #4   Title  Patient will increase fine motor coordination as evidenced by completing the 9 hole peg test with his left hand in 45" or less in order to increase use of nondominant left hand during ADLs.    Time  8    Period  Weeks    Status  On-going            Plan - 02/11/18 1001    Clinical Impression Statement  A: Continued with wrist/forearm strengthening using green theraband, pt focusing on slow and controlled movement using good form. Pt completed small pegboard activity working on pinch, pt has greater success with lateral pinch and max difficulty with 3 point or 2 point pinch tasks using thumb and index finger due to thumb weakness. Pt demonstrates compensatory muscle movements in the wrist when attempting thumb opposition and flexion. Verbal cuing for form during tasks.     Plan  P: Continue working on pinch tasks working on thumb and index finger utilization, thumb flexion, resume hand gripper exercises       Patient will benefit from skilled therapeutic intervention in order to improve the following deficits and impairments:  Decreased activity tolerance, Decreased knowledge of use of DME, Decreased strength, Pain, Decreased coordination, Impaired UE functional use  Visit Diagnosis: Other lack of coordination  Other symptoms and signs involving the musculoskeletal system    Problem List Patient Active Problem List   Diagnosis  Date Noted  . Cervical spondylosis with myelopathy and radiculopathy 12/24/2017  . Coronary artery disease involving native coronary artery of native heart without angina pectoris 12/23/2017  . Hypercholesterolemia 12/23/2017  . Mild obesity 09/20/2016  . Edema of both legs 01/17/2013  . Long term current use of anticoagulant therapy 12/30/2012  . History of deep vein thrombosis (DVT) of lower extremity 12/26/2012  . Dyslipidemia 12/26/2012  . Coronary atherosclerosis without stenosis 12/26/2012   Guadelupe Sabin, OTR/L  [phone] 02/11/2018, 10:29 AM  [facility] [address] [place], [place], [address] Phone: [phone]   Fax:  [phone]  Name: Casey Castillo MRN: [id number] Date of Birth: 03/08/1946

## 2018-02-13 ENCOUNTER — Encounter (HOSPITAL_COMMUNITY): Payer: Self-pay | Admitting: Occupational Therapy

## 2018-02-13 ENCOUNTER — Ambulatory Visit (HOSPITAL_COMMUNITY): Payer: PPO | Admitting: Occupational Therapy

## 2018-02-13 DIAGNOSIS — R278 Other lack of coordination: Secondary | ICD-10-CM

## 2018-02-13 DIAGNOSIS — R29898 Other symptoms and signs involving the musculoskeletal system: Secondary | ICD-10-CM

## 2018-02-13 NOTE — Therapy (Signed)
[facility] [place], [place], [address] Phone: [phone]   Fax:  [phone]  Occupational Therapy Treatment  Patient Details  Name: Casey Castillo MRN: [id number] Date of Birth: 1945/06/14 Referring Provider: Dr. Kristeen Miss   Encounter Date: 02/13/2018  OT End of Session - 02/13/18 1113    Visit Number  12    Number of Visits  16    Date for OT Re-Evaluation  03/01/18    Authorization Type  [employer]-$15 copay    Authorization Time Period  visits based on medical necessity    OT Start Time  1033    OT Stop Time  1114    OT Time Calculation (min)  41 min    Activity Tolerance  Patient tolerated treatment well    Behavior During Therapy  [facility] for tasks assessed/performed       Past Medical History:  Diagnosis Date  . Arthritis   . Cervical myelopathy with cervical radiculopathy   . Coronary artery disease    5%blockage on left anterior descending Dr. Sallyanne Kuster   . Diverticulitis    hx of  . Enlarged prostate    takes flomax  . GERD (gastroesophageal reflux disease)   . History of blood clots    november, 5 1995  . Hyperlipidemia    primary Dr. Selena Batten  . Kidney stone    hx of sees Dr. Chauncey Cruel. [place] [facility]  . Pneumonia    as a child  . PTSD (post-traumatic stress disorder)   . Seasonal allergies   . Urinary frequency    hx of     Past Surgical History:  Procedure Laterality Date  . ANTERIOR CERVICAL DECOMP/DISCECTOMY FUSION  01/28/2012   Procedure: ANTERIOR CERVICAL DECOMPRESSION/DISCECTOMY FUSION 2 LEVELS;  Surgeon: Kristeen Miss, MD;  Location: [place] [facility];  Service: Neurosurgery;  Laterality: N/A;  Cervical six-seven, cervical seven-thoracic one Anterior cervical decompression/diskectomy/fusion  . ANTERIOR CERVICAL DECOMP/DISCECTOMY FUSION N/A 12/24/2017   Procedure: ANTERIOR CERVICAL DECOMPRESSION/DISCECTOMY FUSION CERVICAL FOUR-FIVE/CERVICAL FIVE-SIX TWO LEVELS;  Surgeon: Kristeen Miss, MD;   Location: [place] [facility];  Service: Neurosurgery;  Laterality: N/A;  ANTERIOR CERVICAL DECOMPRESSION/DISCECTOMY FUSION CERVICAL FOUR-FIVE/CERVICAL FIVE-SIX TWO LEVELS  . CARDIAC CATHETERIZATION     09-06-11   . CARDIOVASCULAR STRESS TEST     2006 and 2008 at [facility] heart and vascular, 2011 myoview and echo  . COLONOSCOPY    . COLONOSCOPY N/A 08/30/2015   Procedure: COLONOSCOPY;  Surgeon: Aviva Signs, MD;  [facility];  Service: Gastroenterology;  Laterality: N/A;  . EYE SURGERY     Laser for torn retina  . HEMORRHOID SURGERY     sept 8, 2008  . KIDNEY STONE SURGERY     lithotripsy 1980's  . KNEE SURGERY     left 04-11-94 ( has had two surgeries to left knee)  . NASAL SINUS SURGERY     Jun 30 1998  . NM MYOVIEW LTD  07/20/2009   no ischemia  . PILONIDAL CYST / SINUS EXCISION     surgery 01-08-74  . SHOULDER SURGERY     right 12-20-2003  . TRIGGER FINGER RELEASE     03-21-98 right hand ring finger  . US ECHOCARDIOGRAPHY  07/20/2009   LA & RA mildly dilated,trace MR,TR.    There were no vitals filed for this visit.  Subjective Assessment - 02/13/18 1028    Subjective   S: I cannot get these fingers to work together.  Currently in Pain?  No/denies         [facility] OT Assessment - 02/13/18 1028      Assessment   Medical Diagnosis  LUE weakness      Precautions   Precautions  Cervical    Precaution Comments  Anterior Cervical Decompression of C4-5, C5-6 on 12/24/17                OT Treatments/Exercises (OP) - 02/13/18 1028      Exercises   Exercises  Hand;Wrist;Elbow      Elbow Exercises   Forearm Supination  Theraband;10 reps    Theraband Level (Supination)  Level 3 (Green)    Forearm Pronation  Theraband;10 reps    Theraband Level (Pronation)  Level 3 (Green)      Additional Elbow Exercises   Hand Gripper with Large Beads  all beads gripper at 7#-attempted with 11#, pt unable to complete    Hand Gripper with Medium Beads  all beads gripper at 7#,  gripper vertical, multiple rest breaks      Wrist Exercises   Wrist Flexion  Theraband;10 reps    Theraband Level (Wrist Flexion)  Level 3 (Green)    Wrist Extension  Theraband;10 reps    Theraband Level (Wrist Extension)  Level 3 (Green)    Wrist Radial Deviation  Theraband;10 reps    Theraband Level (Radial Deviation)  Level 3 (Green)    Wrist Ulnar Deviation  Theraband;10 reps    Theraband Level (Ulnar Deviation)  Level 3 (Green)      Additional Wrist Exercises   Sponges  Pt used yellow clothespin horizontally to grasp sponges using 3 point pinch and place in bucket. Placed 20 sponges, occasional rest breaks      Hand Exercises   Other Hand Exercises  Pen roll-intrinsic muscle activation, 10X    Other Hand Exercises  thumb flexion, 10X, mod facilitation from OT for correct muscle activation. Thumb circles each direction, 10X each               OT Short Term Goals - 01/28/18 1002      OT SHORT TERM GOAL #1   Title  Pt will be provided with and educated on HEP for improved use of left hand as dominant during ADLs.     Time  4    Period  Weeks    Status  On-going      OT SHORT TERM GOAL #2   Title  Pt will improve left grip strength by 15# to improve ability to grasp and hold lightweight items in the left hand during ADLs.     Time  4    Period  Weeks    Status  On-going      OT SHORT TERM GOAL #3   Title  Pt will improve left pinch strength by 4# to improve ability to using writing utensils with with left hand.     Time  4    Period  Weeks    Status  On-going      OT SHORT TERM GOAL #4   Title  Pt will improve left wrist strength to 5/5 to improve ability to stabilize hand and any items held when working on cars.     Time  4    Period  Weeks    Status  Partially Met      OT SHORT TERM GOAL #5   Title  Patient will increase fine motor coordination as evidenced by completing the  9 hole peg test with his left hand in 1' 10" or less in order to increase use of  nondominant left hand during ADLs.    Time  4    Period  Weeks    Status  Achieved        OT Long Term Goals - 01/14/18 1215      OT LONG TERM GOAL #1   Title  Pt will achieve highest level of functioning using LUE as dominant during ADL and leisure tasks.     Time  8    Period  Weeks    Status  On-going      OT LONG TERM GOAL #2   Title  Pt will improve left grip strength by 30# to improve ability to hold golf clubs.     Time  8    Period  Weeks    Status  On-going      OT LONG TERM GOAL #3   Title  Pt will improve left pinch strength by 8# to improve ability to hold small items such as keys in left hand without dropping.    Period  Weeks    Status  On-going      OT LONG TERM GOAL #4   Title  Patient will increase fine motor coordination as evidenced by completing the 9 hole peg test with his left hand in 45" or less in order to increase use of nondominant left hand during ADLs.    Time  8    Period  Weeks    Status  On-going            Plan - 02/13/18 1109    Clinical Impression Statement  A: Session focusing on 3 point pinch and thumb muscle activation. Pt has max difficulty with flexion, verbal cuing to slow down, tactile facilitation for thumb flexion. Pt able to complete 3 point pinch task with clothespin horizontal-keeping wrist in neutral versus using max compensatory movements with clothespin vertical. Continued with wrist strengthening and hand gripper activity, attempted to increase gripper however pt unable to grasp beads.  Improvement in wrist form with theraband exercises.     Plan  P: Continue to work on hand intrinsic muscle activation and strength, grip strength, 2 or 3 point pinch task       Patient will benefit from skilled therapeutic intervention in order to improve the following deficits and impairments:  Decreased activity tolerance, Decreased knowledge of use of DME, Decreased strength, Pain, Decreased coordination, Impaired UE functional  use  Visit Diagnosis: Other lack of coordination  Other symptoms and signs involving the musculoskeletal system    Problem List Patient Active Problem List   Diagnosis Date Noted  . Cervical spondylosis with myelopathy and radiculopathy 12/24/2017  . Coronary artery disease involving native coronary artery of native heart without angina pectoris 12/23/2017  . Hypercholesterolemia 12/23/2017  . Mild obesity 09/20/2016  . Edema of both legs 01/17/2013  . Long term current use of anticoagulant therapy 12/30/2012  . History of deep vein thrombosis (DVT) of lower extremity 12/26/2012  . Dyslipidemia 12/26/2012  . Coronary atherosclerosis without stenosis 12/26/2012   Guadelupe Sabin, OTR/L  [phone] 02/13/2018, 11:16 AM  [facility] [address] [place], [place], [address] Phone: [phone]   Fax:  [phone]  Name: CHESTER SIBERT MRN: [id number] Date of Birth: 07-21-1945

## 2018-02-18 ENCOUNTER — Ambulatory Visit (HOSPITAL_COMMUNITY): Payer: PPO | Admitting: Occupational Therapy

## 2018-02-18 ENCOUNTER — Encounter (HOSPITAL_COMMUNITY): Payer: Self-pay | Admitting: Occupational Therapy

## 2018-02-18 DIAGNOSIS — R278 Other lack of coordination: Secondary | ICD-10-CM

## 2018-02-18 DIAGNOSIS — R29898 Other symptoms and signs involving the musculoskeletal system: Secondary | ICD-10-CM

## 2018-02-18 NOTE — Therapy (Signed)
[facility], [place], [address] Phone: [phone]   Fax:  [phone]  Occupational Therapy Treatment  Patient Details  Name: Casey Castillo MRN: [id number] Date of Birth: 1946-05-15 Referring Provider: Dr. Kristeen Miss   Encounter Date: 02/18/2018  OT End of Session - 02/18/18 1025    Visit Number  13    Number of Visits  16    Date for OT Re-Evaluation  03/01/18    Authorization Type  [employer]-$15 copay    Authorization Time Period  visits based on medical necessity    OT Start Time  0941    OT Stop Time  1028    OT Time Calculation (min)  47 min    Activity Tolerance  Patient tolerated treatment well    Behavior During Therapy  [facility] for tasks assessed/performed       Past Medical History:  Diagnosis Date  . Arthritis   . Cervical myelopathy with cervical radiculopathy   . Coronary artery disease    5%blockage on left anterior descending Dr. Sallyanne Kuster   . Diverticulitis    hx of  . Enlarged prostate    takes flomax  . GERD (gastroesophageal reflux disease)   . History of blood clots    november, 5 1995  . Hyperlipidemia    primary Dr. Selena Batten  . Kidney stone    hx of sees Dr. Chauncey Cruel. [place] [facility]  . Pneumonia    as a child  . PTSD (post-traumatic stress disorder)   . Seasonal allergies   . Urinary frequency    hx of     Past Surgical History:  Procedure Laterality Date  . ANTERIOR CERVICAL DECOMP/DISCECTOMY FUSION  01/28/2012   Procedure: ANTERIOR CERVICAL DECOMPRESSION/DISCECTOMY FUSION 2 LEVELS;  Surgeon: Kristeen Miss, MD;  Location: [facility];  Service: Neurosurgery;  Laterality: N/A;  Cervical six-seven, cervical seven-thoracic one Anterior cervical decompression/diskectomy/fusion  . ANTERIOR CERVICAL DECOMP/DISCECTOMY FUSION N/A 12/24/2017   Procedure: ANTERIOR CERVICAL DECOMPRESSION/DISCECTOMY FUSION CERVICAL FOUR-FIVE/CERVICAL FIVE-SIX TWO LEVELS;  Surgeon: Kristeen Miss, MD;   Location: [facility];  Service: Neurosurgery;  Laterality: N/A;  ANTERIOR CERVICAL DECOMPRESSION/DISCECTOMY FUSION CERVICAL FOUR-FIVE/CERVICAL FIVE-SIX TWO LEVELS  . CARDIAC CATHETERIZATION     09-06-11   . CARDIOVASCULAR STRESS TEST     2006 and 2008 at [facility] heart and vascular, 2011 myoview and echo  . COLONOSCOPY    . COLONOSCOPY N/A 08/30/2015   Procedure: COLONOSCOPY;  Surgeon: Aviva Signs, MD;  [facility];  Service: Gastroenterology;  Laterality: N/A;  . EYE SURGERY     Laser for torn retina  . HEMORRHOID SURGERY     sept 8, 2008  . KIDNEY STONE SURGERY     lithotripsy 1980's  . KNEE SURGERY     left 04-11-94 ( has had two surgeries to left knee)  . NASAL SINUS SURGERY     Jun 30 1998  . NM MYOVIEW LTD  07/20/2009   no ischemia  . PILONIDAL CYST / SINUS EXCISION     surgery 01-08-74  . SHOULDER SURGERY     right 12-20-2003  . TRIGGER FINGER RELEASE     03-21-98 right hand ring finger  . US ECHOCARDIOGRAPHY  07/20/2009   LA & RA mildly dilated,trace MR,TR.    There were no vitals filed for this visit.  Subjective Assessment - 02/18/18 0941    Subjective   S: I've been rolling a pen all weekend.  Currently in Pain?  No/denies         [facility] OT Assessment - 02/18/18 0940      Assessment   Medical Diagnosis  LUE weakness      Precautions   Precautions  Cervical    Precaution Comments  Anterior Cervical Decompression of C4-5, C5-6 on 12/24/17                OT Treatments/Exercises (OP) - 02/18/18 0941      Exercises   Exercises  Hand;Wrist;Elbow      Elbow Exercises   Forearm Supination  Theraband;15 reps    Theraband Level (Supination)  Level 3 (Green)    Forearm Pronation  Theraband;15 reps    Theraband Level (Pronation)  Level 3 (Green)      Additional Elbow Exercises   Hand Gripper with Large Beads  all beads gripper at 7#-attempted with 11#, pt unable to complete    Hand Gripper with Medium Beads  all beads gripper at 7#, gripper  vertical, multiple rest breaks      Wrist Exercises   Wrist Flexion  Theraband;15 reps    Theraband Level (Wrist Flexion)  Level 3 (Green)    Wrist Extension  Theraband;15 reps    Theraband Level (Wrist Extension)  Level 3 (Green)    Wrist Radial Deviation  Theraband;15 reps    Theraband Level (Radial Deviation)  Level 3 (Green)    Wrist Ulnar Deviation  Theraband;15 reps    Theraband Level (Ulnar Deviation)  Level 3 (Green)      Hand Exercises   Other Hand Exercises  Pen roll-intrinsic muscle activation, 10X      Fine Motor Coordination (Hand/Wrist)   Fine Motor Coordination  Small Pegboard    Small Pegboard  Pt continued working on thumb flexion/palmar abuduction. Pt used tweezers for task, max difficulty to limit compensatory movements. Pt used coban around index and middle fingers for greater index finger participation. Increased time required for task. Pt removed using 2 point pinch with thumb and index finger, mod/max difficulty               OT Short Term Goals - 01/28/18 1002      OT SHORT TERM GOAL #1   Title  Pt will be provided with and educated on HEP for improved use of left hand as dominant during ADLs.     Time  4    Period  Weeks    Status  On-going      OT SHORT TERM GOAL #2   Title  Pt will improve left grip strength by 15# to improve ability to grasp and hold lightweight items in the left hand during ADLs.     Time  4    Period  Weeks    Status  On-going      OT SHORT TERM GOAL #3   Title  Pt will improve left pinch strength by 4# to improve ability to using writing utensils with with left hand.     Time  4    Period  Weeks    Status  On-going      OT SHORT TERM GOAL #4   Title  Pt will improve left wrist strength to 5/5 to improve ability to stabilize hand and any items held when working on cars.     Time  4    Period  Weeks    Status  Partially Met      OT SHORT TERM GOAL #5  Title  Patient will increase fine motor coordination as  evidenced by completing the 9 hole peg test with his left hand in '1\' 10"'  or less in order to increase use of nondominant left hand during ADLs.    Time  4    Period  Weeks    Status  Achieved        OT Long Term Goals - 01/14/18 1215      OT LONG TERM GOAL #1   Title  Pt will achieve highest level of functioning using LUE as dominant during ADL and leisure tasks.     Time  8    Period  Weeks    Status  On-going      OT LONG TERM GOAL #2   Title  Pt will improve left grip strength by 30# to improve ability to hold golf clubs.     Time  8    Period  Weeks    Status  On-going      OT LONG TERM GOAL #3   Title  Pt will improve left pinch strength by 8# to improve ability to hold small items such as keys in left hand without dropping.    Period  Weeks    Status  On-going      OT LONG TERM GOAL #4   Title  Patient will increase fine motor coordination as evidenced by completing the 9 hole peg test with his left hand in 45" or less in order to increase use of nondominant left hand during ADLs.    Time  8    Period  Weeks    Status  On-going            Plan - 02/18/18 1001    Clinical Impression Statement  A: Session continuing to work on wrist strength, grip strength, pinch strength, and intrinsic muscle activation. Pt with greater ease during hand gripper with large beads, attempt to increase resistance however pt unable to complete task; continues to require rest breaks during medium sized beads.  Continued with pinch task working to activate thumb into flexion/palmer abduction, max difficulty with task, added tweezers to provide greater surface area for thumb and index finger. Verbal cuing for decreasing compensatory muscle activation.     Plan  P: Reassess and decide if need recert or discharge. Follow up on MD appt       Patient will benefit from skilled therapeutic intervention in order to improve the following deficits and impairments:  Decreased activity tolerance,  Decreased knowledge of use of DME, Decreased strength, Pain, Decreased coordination, Impaired UE functional use  Visit Diagnosis: Other lack of coordination  Other symptoms and signs involving the musculoskeletal system    Problem List Patient Active Problem List   Diagnosis Date Noted  . Cervical spondylosis with myelopathy and radiculopathy 12/24/2017  . Coronary artery disease involving native coronary artery of native heart without angina pectoris 12/23/2017  . Hypercholesterolemia 12/23/2017  . Mild obesity 09/20/2016  . Edema of both legs 01/17/2013  . Long term current use of anticoagulant therapy 12/30/2012  . History of deep vein thrombosis (DVT) of lower extremity 12/26/2012  . Dyslipidemia 12/26/2012  . Coronary atherosclerosis without stenosis 12/26/2012   Guadelupe Sabin, OTR/L  [phone] 02/18/2018, 10:29 AM  [facility] [address] [place], [place], [address] Phone: [phone]   Fax:  [phone]  Name: Casey Castillo MRN: [id number] Date of Birth: 07-24-1945

## 2018-02-20 ENCOUNTER — Encounter (HOSPITAL_COMMUNITY): Payer: Self-pay | Admitting: Occupational Therapy

## 2018-02-20 ENCOUNTER — Ambulatory Visit (HOSPITAL_COMMUNITY): Payer: PPO | Admitting: Occupational Therapy

## 2018-02-20 DIAGNOSIS — R278 Other lack of coordination: Secondary | ICD-10-CM

## 2018-02-20 DIAGNOSIS — G540 Brachial plexus disorders: Secondary | ICD-10-CM

## 2018-02-20 DIAGNOSIS — R29898 Other symptoms and signs involving the musculoskeletal system: Secondary | ICD-10-CM

## 2018-02-20 NOTE — Therapy (Signed)
[place], [place], [address] Phone: [phone]   Fax:  [phone]  Occupational Therapy Reassessment, Treatment (Recertification)  Patient Details  Name: Casey Castillo MRN: [id number] Date of Birth: October 05, 1945 Referring Provider: Dr. Kristeen Miss   Encounter Date: 02/20/2018  OT End of Session - 02/20/18 1100    Visit Number  14    Number of Visits  22    Date for OT Re-Evaluation  03/28/18    Authorization Type  [employer]-$15 copay    Authorization Time Period  visits based on medical necessity    OT Start Time  1034    OT Stop Time  1116    OT Time Calculation (min)  42 min    Activity Tolerance  Patient tolerated treatment well    Behavior During Therapy  [facility] for tasks assessed/performed       Past Medical History:  Diagnosis Date  . Arthritis   . Cervical myelopathy with cervical radiculopathy   . Coronary artery disease    5%blockage on left anterior descending Dr. Sallyanne Kuster   . Diverticulitis    hx of  . Enlarged prostate    takes flomax  . GERD (gastroesophageal reflux disease)   . History of blood clots    november, 5 1995  . Hyperlipidemia    primary Dr. Selena Batten  . Kidney stone    hx of sees Dr. Chauncey Cruel. [place] [facility]  . Pneumonia    as a child  . PTSD (post-traumatic stress disorder)   . Seasonal allergies   . Urinary frequency    hx of     Past Surgical History:  Procedure Laterality Date  . ANTERIOR CERVICAL DECOMP/DISCECTOMY FUSION  01/28/2012   Procedure: ANTERIOR CERVICAL DECOMPRESSION/DISCECTOMY FUSION 2 LEVELS;  Surgeon: Kristeen Miss, MD;  Location: [facility];  Service: Neurosurgery;  Laterality: N/A;  Cervical six-seven, cervical seven-thoracic one Anterior cervical decompression/diskectomy/fusion  . ANTERIOR CERVICAL DECOMP/DISCECTOMY FUSION N/A 12/24/2017   Procedure: ANTERIOR CERVICAL DECOMPRESSION/DISCECTOMY FUSION CERVICAL FOUR-FIVE/CERVICAL FIVE-SIX TWO LEVELS;   Surgeon: Kristeen Miss, MD;  Location: [facility];  Service: Neurosurgery;  Laterality: N/A;  ANTERIOR CERVICAL DECOMPRESSION/DISCECTOMY FUSION CERVICAL FOUR-FIVE/CERVICAL FIVE-SIX TWO LEVELS  . CARDIAC CATHETERIZATION     09-06-11   . CARDIOVASCULAR STRESS TEST     2006 and 2008 at [facility] [place] heart and vascular, 2011 myoview and echo  . COLONOSCOPY    . COLONOSCOPY N/A 08/30/2015   Procedure: COLONOSCOPY;  Surgeon: Aviva Signs, MD;  [facility];  Service: Gastroenterology;  Laterality: N/A;  . EYE SURGERY     Laser for torn retina  . HEMORRHOID SURGERY     sept 8, 2008  . KIDNEY STONE SURGERY     lithotripsy 1980's  . KNEE SURGERY     left 04-11-94 ( has had two surgeries to left knee)  . NASAL SINUS SURGERY     Jun 30 1998  . NM MYOVIEW LTD  07/20/2009   no ischemia  . PILONIDAL CYST / SINUS EXCISION     surgery 01-08-74  . SHOULDER SURGERY     right 12-20-2003  . TRIGGER FINGER RELEASE     03-21-98 right hand ring finger  . US ECHOCARDIOGRAPHY  07/20/2009   LA & RA mildly dilated,trace MR,TR.    There were no vitals filed for this visit.  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     Currently in Pain?  No/denies         [facility] OT Assessment - 02/20/18 1032      Assessment   Medical Diagnosis  LUE weakness      Precautions   Precautions  Cervical    Precaution Comments  Anterior Cervical Decompression of C4-5, C5-6 on 12/24/17       Coordination   Left 9 Hole Peg Test  _0    _1  previous     Strength   Left Wrist Flexion  4+/5   same as previous   Left Wrist Extension  5/5   same as previous   Left Wrist Radial Deviation  4+/5   4/5 previous   Left Wrist Ulnar Deviation  5/5   4/5 previous   Left Hand Grip (lbs)  7   same as previous   Left Hand Lateral Pinch  4 lbs   same as previous   Left Hand 3 Point Pinch  3 lbs   same as previous              OT  Treatments/Exercises (OP) - 02/20/18 1050      Exercises   Exercises  Hand;Wrist      Additional Wrist Exercises   Sponges  Pt placed 5 sponges into bucket using yellow clothespin and lateral pinch. Pt then worked to [employer] 5 sponges using yellow clothespin and 3 point pinch-mod difficulty. Pt attempted red clothespin, was able to open with lateral pinch however was unable to grasp sponges.     Hand Gripper with Medium Beads  all beads gripper at 7#, gripper vertical, multiple rest breaks      Hand Exercises   Other Hand Exercises  Pen roll-intrinsic muscle activation, 10X using buddy tape for index and middle fingers               OT Short Term Goals - 02/20/18 1100      OT SHORT TERM GOAL #1   Title  Pt will be provided with and educated on HEP for improved use of left hand as dominant during ADLs.     Time  4    Period  Weeks    Status  On-going    Target Date  03/28/18      OT SHORT TERM GOAL #2   Title  Pt will improve left grip strength by 10# to improve ability to grasp and hold lightweight items in the left hand during ADLs.     Baseline  Due to nerve damage goal revised to 10# from 15#. Pt has gained 5# (now at 7# from initial 2#)    Time  4    Period  Weeks    Status  Revised      OT SHORT TERM GOAL #3   Title  Pt will improve left pinch strength by 4# to improve ability to using writing utensils with with left hand.     Time  4    Period  Weeks    Status  On-going      OT SHORT TERM GOAL #4   Title  Pt will improve left wrist strength to 5/5 to improve ability to stabilize hand and any items held when working on cars.     Baseline  Pt lacking in radial deviation and flexion.     Time  4    Period  Weeks    Status  Partially Met      OT  SHORT TERM GOAL #5   Title  Patient will increase fine motor coordination as evidenced by completing the 9 hole peg test with his left hand in _0  or less in order to increase use of nondominant left hand during ADLs.     Time  4    Period  Weeks    Status  Achieved      Additional Short Term Goals   Additional Short Term Goals  Yes      OT SHORT TERM GOAL #6   Title  Pt will be educated on and utilize AE as needed to improve ability to use left hand for handwriting tasks.     Time  4    Period  Weeks    Status  New    Target Date  03/28/18      OT SHORT TERM GOAL #7   Title  Pt will be educated on using adaptive eating utensils to improve ability to cut food and bring various textures/types of food to mouth using left hand as dominant.     Time  4    Period  Weeks    Status  New      OT SHORT TERM GOAL #8   Title  Pt will be educated on ways of adapting items such as golf grips to improve ability to play golf, as well as compensating for limited grip ability.     Time  4    Period  Weeks    Status  New        OT Long Term Goals - 02/20/18 1100      OT LONG TERM GOAL #1   Title  Pt will achieve highest level of functioning using LUE as dominant during ADL and leisure tasks.     Time  8    Period  Weeks    Status  On-going    Target Date  03/28/18      OT LONG TERM GOAL #2   Title  Pt will improve left grip strength by 30# to improve ability to hold golf clubs.     Baseline  Deferred due to nerve damage    Time  8    Period  Weeks    Status  Deferred      OT LONG TERM GOAL #3   Title  Pt will improve left pinch strength by 8# to improve ability to hold small items such as keys in left hand without dropping.    Baseline  Deferred due to nerve damage    Period  Weeks    Status  Deferred      OT LONG TERM GOAL #4   Title  Patient will increase fine motor coordination as evidenced by completing the 9 hole peg test with his left hand in 45" or less in order to increase use of nondominant left hand during ADLs.    Time  8    Period  Weeks    Status  On-going            Plan - 02/20/18 1147    Clinical Impression Statement  A: Reassessment completed this session, pt has met  1 STG and partially met an additional STG. Pt had follow up appt this week from nerve conduction study, MD reporting he does have permanent nerve damage that will limit his functional abilities. MD requesting additional visits as he does feel additional improvement can be made. Goals have been reviewed and revised, some goals deferred due  to limited progress. Additional goals created for AE and educating on compensatory strategies for using LUE as dominant with functional limitations.     Occupational performance deficits (Please refer to evaluation for details):  ADL's;IADL's;Work;Leisure    OT Treatment/Interventions  Self-care/ADL training;Therapeutic exercise;Ultrasound;Neuromuscular education;Manual Therapy;Therapeutic activities;Cryotherapy;DME and/or AE instruction;Electrical Stimulation;Moist Heat;Passive range of motion;Patient/family education    Plan  P: Continue working on grip strength and pinch tasks using buddy tape to encourage index finger muscle activation and participation. Work on trialing various handwriting tools/grips for building up handle to improve ease of grip.        Patient will benefit from skilled therapeutic intervention in order to improve the following deficits and impairments:  Decreased activity tolerance, Decreased knowledge of use of DME, Decreased strength, Pain, Decreased coordination, Impaired UE functional use  Visit Diagnosis: Other lack of coordination  Other symptoms and signs involving the musculoskeletal system  Brachial plexus disorders    Problem List Patient Active Problem List   Diagnosis Date Noted  . Cervical spondylosis with myelopathy and radiculopathy 12/24/2017  . Coronary artery disease involving native coronary artery of native heart without angina pectoris 12/23/2017  . Hypercholesterolemia 12/23/2017  . Mild obesity 09/20/2016  . Edema of both legs 01/17/2013  . Long term current use of anticoagulant therapy 12/30/2012  . History  of deep vein thrombosis (DVT) of lower extremity 12/26/2012  . Dyslipidemia 12/26/2012  . Coronary atherosclerosis without stenosis 12/26/2012   Guadelupe Sabin, OTR/L  [phone] 02/20/2018, 11:53 AM  [facility] [address] [place], [place], [address] Phone: [phone]   Fax:  [phone]  Name: DUAINE RADIN MRN: [id number] Date of Birth: 10-Oct-1945

## 2018-02-24 ENCOUNTER — Other Ambulatory Visit: Payer: Self-pay | Admitting: Neurological Surgery

## 2018-02-27 DIAGNOSIS — E7849 Other hyperlipidemia: Secondary | ICD-10-CM | POA: Diagnosis not present

## 2018-02-27 DIAGNOSIS — F4312 Post-traumatic stress disorder, chronic: Secondary | ICD-10-CM | POA: Diagnosis not present

## 2018-02-27 DIAGNOSIS — R7309 Other abnormal glucose: Secondary | ICD-10-CM | POA: Diagnosis not present

## 2018-03-05 ENCOUNTER — Ambulatory Visit (HOSPITAL_COMMUNITY): Payer: PPO

## 2018-03-05 ENCOUNTER — Encounter (HOSPITAL_COMMUNITY): Payer: Self-pay

## 2018-03-05 DIAGNOSIS — R29898 Other symptoms and signs involving the musculoskeletal system: Secondary | ICD-10-CM

## 2018-03-05 DIAGNOSIS — R278 Other lack of coordination: Secondary | ICD-10-CM | POA: Diagnosis not present

## 2018-03-05 NOTE — Therapy (Signed)
[facility] [address] [place], [place], [address] Phone: [phone]   Fax:  [phone]  Occupational Therapy Treatment  Patient Details  Name: Casey Castillo MRN: [id number] Date of Birth: 1946/04/03 Referring Provider: Dr. Kristeen Miss   Encounter Date: 03/05/2018  OT End of Session - 03/05/18 1240    Visit Number  15    Number of Visits  22    Date for OT Re-Evaluation  03/28/18    Authorization Type  [employer]-$15 copay    Authorization Time Period  visits based on medical necessity    OT Start Time  1035    OT Stop Time  1115    OT Time Calculation (min)  40 min    Activity Tolerance  Patient tolerated treatment well    Behavior During Therapy  [facility] for tasks assessed/performed       Past Medical History:  Diagnosis Date  . Arthritis   . Cervical myelopathy with cervical radiculopathy   . Coronary artery disease    5%blockage on left anterior descending Dr. Sallyanne Kuster   . Diverticulitis    hx of  . Enlarged prostate    takes flomax  . GERD (gastroesophageal reflux disease)   . History of blood clots    november, 5 1995  . Hyperlipidemia    primary Dr. Selena Batten  . Kidney stone    hx of sees Dr. Chauncey Cruel. [place] [facility]  . Pneumonia    as a child  . PTSD (post-traumatic stress disorder)   . Seasonal allergies   . Urinary frequency    hx of     Past Surgical History:  Procedure Laterality Date  . ANTERIOR CERVICAL DECOMP/DISCECTOMY FUSION  01/28/2012   Procedure: ANTERIOR CERVICAL DECOMPRESSION/DISCECTOMY FUSION 2 LEVELS;  Surgeon: Kristeen Miss, MD;  Location: [facility];  Service: Neurosurgery;  Laterality: N/A;  Cervical six-seven, cervical seven-thoracic one Anterior cervical decompression/diskectomy/fusion  . ANTERIOR CERVICAL DECOMP/DISCECTOMY FUSION N/A 12/24/2017   Procedure: ANTERIOR CERVICAL DECOMPRESSION/DISCECTOMY FUSION CERVICAL FOUR-FIVE/CERVICAL FIVE-SIX TWO LEVELS;  Surgeon: Kristeen Miss, MD;   Location: [facility];  Service: Neurosurgery;  Laterality: N/A;  ANTERIOR CERVICAL DECOMPRESSION/DISCECTOMY FUSION CERVICAL FOUR-FIVE/CERVICAL FIVE-SIX TWO LEVELS  . CARDIAC CATHETERIZATION     09-06-11   . CARDIOVASCULAR STRESS TEST     2006 and 2008 at [facility] heart and vascular, 2011 myoview and echo  . COLONOSCOPY    . COLONOSCOPY N/A 08/30/2015   Procedure: COLONOSCOPY;  Surgeon: Aviva Signs, MD;  [facility];  Service: Gastroenterology;  Laterality: N/A;  . EYE SURGERY     Laser for torn retina  . HEMORRHOID SURGERY     sept 8, 2008  . KIDNEY STONE SURGERY     lithotripsy 1980's  . KNEE SURGERY     left 04-11-94 ( has had two surgeries to left knee)  . NASAL SINUS SURGERY     Jun 30 1998  . NM MYOVIEW LTD  07/20/2009   no ischemia  . PILONIDAL CYST / SINUS EXCISION     surgery 01-08-74  . SHOULDER SURGERY     right 12-20-2003  . TRIGGER FINGER RELEASE     03-21-98 right hand ring finger  . US ECHOCARDIOGRAPHY  07/20/2009   LA & RA mildly dilated,trace MR,TR.    There were no vitals filed for this visit.  Subjective Assessment - 03/05/18 1046    Subjective   S: My pointer finger is loving a lot more with my other  fingers than it used to.     Currently in Pain?  No/denies         [facility] OT Assessment - 03/05/18 1048      Assessment   Medical Diagnosis  LUE weakness      Precautions   Precautions  Cervical    Precaution Comments  Anterior Cervical Decompression of C4-5, C5-6 on 12/24/17                OT Treatments/Exercises (OP) - 03/05/18 1052      ADLs   Writing  5 various writing utensil grip were used during session to assess need for writing. All grips presented with the same legibility and none posed to be helpful. After further assessment it was noted that patient does not have the isolated finger movement needed to successfully complete handwriting without relying on primarily wrist movements. Lack of thumb mobility in left hand causes  increased difficulty.       Exercises   Exercises  Hand;Wrist      Additional Wrist Exercises   Sponges  Pt utilized yellow resistive clothespin to pick up 30 sponges while using a 3 point pinch with buddy tape    Hand Gripper with Medium Beads  all beads with gripper set at 7#   buddy tape   Hand Gripper with Small Beads  8 beads picked up with a pronated position of forearm with gripper held horizontal. Buddy tape used.              OT Education - 03/05/18 1239    Education Details  Patient was encouraged to practice isolated finger movements needed for writing by drawing circles and filling them in only using isolated finger movements.     Person(s) Educated  Patient    Methods  Explanation;Demonstration;Verbal cues    Comprehension  Returned demonstration;Verbalized understanding       OT Short Term Goals - 03/05/18 1246      OT SHORT TERM GOAL #1   Title  Pt will be provided with and educated on HEP for improved use of left hand as dominant during ADLs.     Time  4    Period  Weeks    Status  On-going      OT SHORT TERM GOAL #2   Title  Pt will improve left grip strength by 10# to improve ability to grasp and hold lightweight items in the left hand during ADLs.     Baseline  Due to nerve damage goal revised to 10# from 15#. Pt has gained 5# (now at 7# from initial 2#)    Time  4    Period  Weeks    Status  On-going      OT SHORT TERM GOAL #3   Title  Pt will improve left pinch strength by 4# to improve ability to using writing utensils with with left hand.     Time  4    Period  Weeks    Status  On-going      OT SHORT TERM GOAL #4   Title  Pt will improve left wrist strength to 5/5 to improve ability to stabilize hand and any items held when working on cars.     Baseline  Pt lacking in radial deviation and flexion.     Time  4    Period  Weeks    Status  Partially Met      OT SHORT TERM GOAL #5   Title  Patient will increase fine motor coordination as  evidenced by completing the 9 hole peg test with his left hand in '1\' 10"'  or less in order to increase use of nondominant left hand during ADLs.    Time  4    Period  Weeks      OT SHORT TERM GOAL #6   Title  Pt will be educated on and utilize AE as needed to improve ability to use left hand for handwriting tasks.     Time  4    Period  Weeks    Status  On-going      OT SHORT TERM GOAL #7   Title  Pt will be educated on using adaptive eating utensils to improve ability to cut food and bring various textures/types of food to mouth using left hand as dominant.     Time  4    Period  Weeks    Status  On-going      OT SHORT TERM GOAL #8   Title  Pt will be educated on ways of adapting items such as golf grips to improve ability to play golf, as well as compensating for limited grip ability.     Time  4    Period  Weeks    Status  On-going        OT Long Term Goals - 03/05/18 1246      OT LONG TERM GOAL #1   Title  Pt will achieve highest level of functioning using LUE as dominant during ADL and leisure tasks.     Time  8    Period  Weeks    Status  On-going      OT LONG TERM GOAL #2   Title  Pt will improve left grip strength by 30# to improve ability to hold golf clubs.     Baseline  Deferred due to nerve damage    Time  8    Period  Weeks    Status  Deferred      OT LONG TERM GOAL #3   Title  Pt will improve left pinch strength by 8# to improve ability to hold small items such as keys in left hand without dropping.    Baseline  Deferred due to nerve damage    Period  Weeks    Status  Deferred      OT LONG TERM GOAL #4   Title  Patient will increase fine motor coordination as evidenced by completing the 9 hole peg test with his left hand in 45" or less in order to increase use of nondominant left hand during ADLs.    Time  8    Period  Weeks    Status  On-going            Plan - 03/05/18 1242    Clinical Impression Statement  A: Several writing utensil grip  were assessed this date although it was determined that due to lack of isolated finger movements, patient will not benefit from a grip at this time. Patient was shown circle drawing/fill in and encouraged to work on isolated finger movements to address at this time. patient was able to pick up small beads with handgripper while holding it horizontally with increased time and cues for technique.     Plan  P: Follow up on circle drawing. Attempt to work on isolated finger movements to increase handwriting skills. Continue with grip and pinch strengthening. Due to lack of joint mobility in left hand  patient may need to use primarily lateral pinch    Consulted and Agree with Plan of Care  Patient       Patient will benefit from skilled therapeutic intervention in order to improve the following deficits and impairments:  Decreased activity tolerance, Decreased knowledge of use of DME, Decreased strength, Pain, Decreased coordination, Impaired UE functional use  Visit Diagnosis: Other lack of coordination  Other symptoms and signs involving the musculoskeletal system    Problem List Patient Active Problem List   Diagnosis Date Noted  . Cervical spondylosis with myelopathy and radiculopathy 12/24/2017  . Coronary artery disease involving native coronary artery of native heart without angina pectoris 12/23/2017  . Hypercholesterolemia 12/23/2017  . Mild obesity 09/20/2016  . Edema of both legs 01/17/2013  . Long term current use of anticoagulant therapy 12/30/2012  . History of deep vein thrombosis (DVT) of lower extremity 12/26/2012  . Dyslipidemia 12/26/2012  . Coronary atherosclerosis without stenosis 12/26/2012   Ailene Ravel, OTR/L,CBIS  [phone]  03/05/2018, 12:47 PM  [facility] [address] [place], [place], [address] Phone: [phone]   Fax:  [phone]  Name: Casey Castillo MRN: [id number] Date of Birth: Oct 10, 1945

## 2018-03-06 ENCOUNTER — Encounter (HOSPITAL_COMMUNITY): Payer: Self-pay | Admitting: Occupational Therapy

## 2018-03-06 ENCOUNTER — Ambulatory Visit (HOSPITAL_COMMUNITY): Payer: PPO | Admitting: Occupational Therapy

## 2018-03-06 DIAGNOSIS — R278 Other lack of coordination: Secondary | ICD-10-CM

## 2018-03-06 DIAGNOSIS — R29898 Other symptoms and signs involving the musculoskeletal system: Secondary | ICD-10-CM

## 2018-03-06 NOTE — Therapy (Addendum)
[facility] [address] [place], [place], [address] Phone: [phone]   Fax:  [phone]  Occupational Therapy Treatment  Patient Details  Name: Casey Castillo MRN: [id number] Date of Birth: 22-May-1946 No data recorded   Encounter Date: 03/06/2018  OT End of Session - 03/06/18 1035    Visit Number  16    Number of Visits  22    Date for OT Re-Evaluation  03/28/18    Authorization Type  [employer]-$15 copay    Authorization Time Period  visits based on medical necessity    OT Start Time  0947    OT Stop Time  1028    OT Time Calculation (min)  41 min    Activity Tolerance  Patient tolerated treatment well    Behavior During Therapy  [facility] for tasks assessed/performed       Past Medical History:  Diagnosis Date  . Arthritis   . Cervical myelopathy with cervical radiculopathy   . Coronary artery disease    5%blockage on left anterior descending Dr. Sallyanne Kuster   . Diverticulitis    hx of  . Enlarged prostate    takes flomax  . GERD (gastroesophageal reflux disease)   . History of blood clots    november, 5 1995  . Hyperlipidemia    primary Dr. Selena Batten  . Kidney stone    hx of sees Dr. Chauncey Cruel. [place] [facility]  . Pneumonia    as a child  . PTSD (post-traumatic stress disorder)   . Seasonal allergies   . Urinary frequency    hx of     Past Surgical History:  Procedure Laterality Date  . ANTERIOR CERVICAL DECOMP/DISCECTOMY FUSION  01/28/2012   Procedure: ANTERIOR CERVICAL DECOMPRESSION/DISCECTOMY FUSION 2 LEVELS;  Surgeon: Kristeen Miss, MD;  Location: [facility];  Service: Neurosurgery;  Laterality: N/A;  Cervical six-seven, cervical seven-thoracic one Anterior cervical decompression/diskectomy/fusion  . ANTERIOR CERVICAL DECOMP/DISCECTOMY FUSION N/A 12/24/2017   Procedure: ANTERIOR CERVICAL DECOMPRESSION/DISCECTOMY FUSION CERVICAL FOUR-FIVE/CERVICAL FIVE-SIX TWO LEVELS;  Surgeon: Kristeen Miss, MD;  Location: [facility];   Service: Neurosurgery;  Laterality: N/A;  ANTERIOR CERVICAL DECOMPRESSION/DISCECTOMY FUSION CERVICAL FOUR-FIVE/CERVICAL FIVE-SIX TWO LEVELS  . CARDIAC CATHETERIZATION     09-06-11   . CARDIOVASCULAR STRESS TEST     2006 and 2008 at [facility] heart and vascular, 2011 myoview and echo  . COLONOSCOPY    . COLONOSCOPY N/A 08/30/2015   Procedure: COLONOSCOPY;  Surgeon: Aviva Signs, MD;  [facility];  Service: Gastroenterology;  Laterality: N/A;  . EYE SURGERY     Laser for torn retina  . HEMORRHOID SURGERY     sept 8, 2008  . KIDNEY STONE SURGERY     lithotripsy 1980's  . KNEE SURGERY     left 04-11-94 ( has had two surgeries to left knee)  . NASAL SINUS SURGERY     Jun 30 1998  . NM MYOVIEW LTD  07/20/2009   no ischemia  . PILONIDAL CYST / SINUS EXCISION     surgery 01-08-74  . SHOULDER SURGERY     right 12-20-2003  . TRIGGER FINGER RELEASE     03-21-98 right hand ring finger  . US ECHOCARDIOGRAPHY  07/20/2009   LA & RA mildly dilated,trace MR,TR.    There were no vitals filed for this visit.  Subjective Assessment - 03/06/18 0942    Subjective   S: I never have any pain.     Currently in Pain?  No/denies    Multiple Pain Sites  No         [facility] OT Assessment - 03/06/18 0001      Assessment   Medical Diagnosis  LUE weakness      Precautions   Precautions  Cervical    Precaution Comments  Anterior Cervical Decompression of C4-5, C5-6 on 12/24/17                OT Treatments/Exercises (OP) - 03/06/18 0941      ADLs   Eating  Pt trialing use of adaptive utensils with large grip to better. He simulated functional cutting task with large-handled knife and yellow theraputty. Pt simulated scooping task by scooping beads with large-handled spoon.      Exercises   Exercises  Hand;Wrist      Additional Wrist Exercises   Sponges  Pt utilized yellow resistive clothespin to pick up 4 sponges and then progressed to picking up 10 sponges with the red pin while  using a 3 point pinch    Theraputty - Flatten  yellow in seated    Theraputty - Roll  yellow in seated    Theraputty - Grip  yellow in seated; worked on grip strength by gathering rolled line of putty with fingers and squeezing putty to flatten ball shape in palm with mod difficulty and verbal cuing/demonstration by OT.        Hand Exercises   Other Hand Exercises  Pt used yellow clothespins and lateral pinch to place pins on top bar. He then placed red clothespins on middle bar with min/mod difficulty. He then placed green pins on lowest and also placed a few green pins on higher bars with increased time and short rest break required. He then removed all clothespins using 3 point pinch.     Other Hand Exercises  Pt continued to work on controlled fine motor skills during coloring activity. Pt completed task with mod difficulty. OT provided verbal and tactile cuing for proper form and to maintain small movements.       Fine Motor Coordination (Hand/Wrist)   Fine Motor Coordination  --               OT Short Term Goals - 03/05/18 1246      OT SHORT TERM GOAL #1   Title  Pt will be provided with and educated on HEP for improved use of left hand as dominant during ADLs.     Time  4    Period  Weeks    Status  On-going      OT SHORT TERM GOAL #2   Title  Pt will improve left grip strength by 10# to improve ability to grasp and hold lightweight items in the left hand during ADLs.     Baseline  Due to nerve damage goal revised to 10# from 15#. Pt has gained 5# (now at 7# from initial 2#)    Time  4    Period  Weeks    Status  On-going      OT SHORT TERM GOAL #3   Title  Pt will improve left pinch strength by 4# to improve ability to using writing utensils with with left hand.     Time  4    Period  Weeks    Status  On-going      OT SHORT TERM GOAL #4   Title  Pt will improve left wrist strength to 5/5 to improve ability to stabilize hand and  any items held when working on cars.      Baseline  Pt lacking in radial deviation and flexion.     Time  4    Period  Weeks    Status  Partially Met      OT SHORT TERM GOAL #5   Title  Patient will increase fine motor coordination as evidenced by completing the 9 hole peg test with his left hand in _0  or less in order to increase use of nondominant left hand during ADLs.    Time  4    Period  Weeks      OT SHORT TERM GOAL #6   Title  Pt will be educated on and utilize AE as needed to improve ability to use left hand for handwriting tasks.     Time  4    Period  Weeks    Status  On-going      OT SHORT TERM GOAL #7   Title  Pt will be educated on using adaptive eating utensils to improve ability to cut food and bring various textures/types of food to mouth using left hand as dominant.     Time  4    Period  Weeks    Status  On-going      OT SHORT TERM GOAL #8   Title  Pt will be educated on ways of adapting items such as golf grips to improve ability to play golf, as well as compensating for limited grip ability.     Time  4    Period  Weeks    Status  On-going        OT Long Term Goals - 03/05/18 1246      OT LONG TERM GOAL #1   Title  Pt will achieve highest level of functioning using LUE as dominant during ADL and leisure tasks.     Time  8    Period  Weeks    Status  On-going      OT LONG TERM GOAL #2   Title  Pt will improve left grip strength by 30# to improve ability to hold golf clubs.     Baseline  Deferred due to nerve damage    Time  8    Period  Weeks    Status  Deferred      OT LONG TERM GOAL #3   Title  Pt will improve left pinch strength by 8# to improve ability to hold small items such as keys in left hand without dropping.    Baseline  Deferred due to nerve damage    Period  Weeks    Status  Deferred      OT LONG TERM GOAL #4   Title  Patient will increase fine motor coordination as evidenced by completing the 9 hole peg test with his left hand in 45" or less in order to increase  use of nondominant left hand during ADLs.    Time  8    Period  Weeks    Status  On-going            Plan - 03/06/18 1036    Clinical Impression Statement  A: Patient practiced drawing to work on isolated finger movements, following curved lines of coloring sheet provided. He required verbal cuing to isolate movements rather than compensate with wrist. Pt attempted to use large-handled AE to simulate feeding taks with significant difficulty cutting yellow theraputty, but able to scoop beads with ease. Pt reports that  AE did not make tasks easier and he has no interest in using these types of tools at home with feeding tasks. Cues provided for proper technique.     Plan  P: Continue grip and pinch strength-building tasks. Resume gripper with beads activity. Work on isolated finger movements to increase handwriting skills with line-tracing activity.     Consulted and Agree with Plan of Care  Patient       Patient will benefit from skilled therapeutic intervention in order to improve the following deficits and impairments:  Decreased activity tolerance, Decreased knowledge of use of DME, Decreased strength, Pain, Decreased coordination, Impaired UE functional use  Visit Diagnosis: Other symptoms and signs involving the musculoskeletal system  Other lack of coordination  Muscle weakness (generalized)    Problem List Patient Active Problem List   Diagnosis Date Noted  . Cervical spondylosis with myelopathy and radiculopathy 12/24/2017  . Coronary artery disease involving native coronary artery of native heart without angina pectoris 12/23/2017  . Hypercholesterolemia 12/23/2017  . Mild obesity 09/20/2016  . Edema of both legs 01/17/2013  . Long term current use of anticoagulant therapy 12/30/2012  . History of deep vein thrombosis (DVT) of lower extremity 12/26/2012  . Dyslipidemia 12/26/2012  . Coronary atherosclerosis without stenosis 12/26/2012    Toney Rakes, OT  student 03/06/2018, 1:18 PM  [facility] [address] [place], [place], [address] Phone: [phone]   Fax:  [phone]  Name: Casey Castillo MRN: [id number] Date of Birth: January 08, 1946

## 2018-03-07 ENCOUNTER — Other Ambulatory Visit: Payer: Self-pay | Admitting: Neurological Surgery

## 2018-03-07 DIAGNOSIS — G540 Brachial plexus disorders: Secondary | ICD-10-CM

## 2018-03-11 ENCOUNTER — Encounter (HOSPITAL_COMMUNITY): Payer: Self-pay

## 2018-03-11 ENCOUNTER — Ambulatory Visit (HOSPITAL_COMMUNITY): Payer: PPO | Attending: Neurological Surgery

## 2018-03-11 DIAGNOSIS — M6281 Muscle weakness (generalized): Secondary | ICD-10-CM | POA: Insufficient documentation

## 2018-03-11 DIAGNOSIS — R278 Other lack of coordination: Secondary | ICD-10-CM | POA: Diagnosis not present

## 2018-03-11 DIAGNOSIS — R29898 Other symptoms and signs involving the musculoskeletal system: Secondary | ICD-10-CM | POA: Diagnosis not present

## 2018-03-11 NOTE — Therapy (Signed)
[facility] [address] [place], [place], [address] Phone: [phone]   Fax:  [phone]  Occupational Therapy Treatment  Patient Details  Name: Casey Castillo MRN: [id number] Date of Birth: [birth year] No data recorded  Encounter Date: 03/11/2018  OT End of Session - 03/11/18 1459    Visit Number  17    Number of Visits  22    Date for OT Re-Evaluation  03/28/18    Authorization Type  [employer]-$15 copay    Authorization Time Period  visits based on medical necessity    OT Start Time  0815    OT Stop Time  0900    OT Time Calculation (min)  45 min    Activity Tolerance  Patient tolerated treatment well    Behavior During Therapy  [facility] for tasks assessed/performed       Past Medical History:  Diagnosis Date  . Arthritis   . Cervical myelopathy with cervical radiculopathy   . Coronary artery disease    5%blockage on left anterior descending Dr. Sallyanne Kuster   . Diverticulitis    hx of  . Enlarged prostate    takes flomax  . GERD (gastroesophageal reflux disease)   . History of blood clots    november, 5 1995  . Hyperlipidemia    primary Dr. Selena Batten  . Kidney stone    hx of sees Dr. Chauncey Cruel. [place] [facility]  . Pneumonia    as a child  . PTSD (post-traumatic stress disorder)   . Seasonal allergies   . Urinary frequency    hx of     Past Surgical History:  Procedure Laterality Date  . ANTERIOR CERVICAL DECOMP/DISCECTOMY FUSION  01/28/2012   Procedure: ANTERIOR CERVICAL DECOMPRESSION/DISCECTOMY FUSION 2 LEVELS;  Surgeon: Kristeen Miss, MD;  Location: [facility];  Service: Neurosurgery;  Laterality: N/A;  Cervical six-seven, cervical seven-thoracic one Anterior cervical decompression/diskectomy/fusion  . ANTERIOR CERVICAL DECOMP/DISCECTOMY FUSION N/A 12/24/2017   Procedure: ANTERIOR CERVICAL DECOMPRESSION/DISCECTOMY FUSION CERVICAL FOUR-FIVE/CERVICAL FIVE-SIX TWO LEVELS;  Surgeon: Kristeen Miss, MD;  Location: [facility];  Service:  Neurosurgery;  Laterality: N/A;  ANTERIOR CERVICAL DECOMPRESSION/DISCECTOMY FUSION CERVICAL FOUR-FIVE/CERVICAL FIVE-SIX TWO LEVELS  . CARDIAC CATHETERIZATION     09-06-11   . CARDIOVASCULAR STRESS TEST     2006 and 2008 at [facility] heart and vascular, 2011 myoview and echo  . COLONOSCOPY    . COLONOSCOPY N/A 08/30/2015   Procedure: COLONOSCOPY;  Surgeon: Aviva Signs, MD;  [facility];  Service: Gastroenterology;  Laterality: N/A;  . EYE SURGERY     Laser for torn retina  . HEMORRHOID SURGERY     sept 8, 2008  . KIDNEY STONE SURGERY     lithotripsy 1980's  . KNEE SURGERY     left 04-11-94 ( has had two surgeries to left knee)  . NASAL SINUS SURGERY     Jun 30 1998  . NM MYOVIEW LTD  07/20/2009   no ischemia  . PILONIDAL CYST / SINUS EXCISION     surgery 01-08-74  . SHOULDER SURGERY     right 12-20-2003  . TRIGGER FINGER RELEASE     03-21-98 right hand ring finger  . US ECHOCARDIOGRAPHY  07/20/2009   LA & RA mildly dilated,trace MR,TR.    There were no vitals filed for this visit.  Subjective Assessment - 03/11/18 0825    Subjective   S; I feel like it's slowly making progress.     Currently in Pain?  No/denies         [facility] OT Assessment - 03/11/18 0826      Assessment   Medical Diagnosis  LUE weakness      Precautions   Precautions  Cervical    Precaution Comments  Anterior Cervical Decompression of C4-5, C5-6 on 12/24/17                OT Treatments/Exercises (OP) - 03/11/18 0826      Exercises   Exercises  Hand;Wrist;Theraputty      Additional Wrist Exercises   Hand Gripper with Medium Beads  all beads with gripper set at 7#    Hand Gripper with Small Beads  all beads with gripper set at 7#. Gripper horizontal with hand pronated followed by patient's supinated hand to release beads in container.      Hand Exercises   Other Hand Exercises  Focused on lateral pinch with buddy tape on pointer and middle finger. patient placed yellow and green  clothespins placed and removed with rest break for hand fatigue.               OT Short Term Goals - 03/05/18 1246      OT SHORT TERM GOAL #1   Title  Pt will be provided with and educated on HEP for improved use of left hand as dominant during ADLs.     Time  4    Period  Weeks    Status  On-going      OT SHORT TERM GOAL #2   Title  Pt will improve left grip strength by 10# to improve ability to grasp and hold lightweight items in the left hand during ADLs.     Baseline  Due to nerve damage goal revised to 10# from 15#. Pt has gained 5# (now at 7# from initial 2#)    Time  4    Period  Weeks    Status  On-going      OT SHORT TERM GOAL #3   Title  Pt will improve left pinch strength by 4# to improve ability to using writing utensils with with left hand.     Time  4    Period  Weeks    Status  On-going      OT SHORT TERM GOAL #4   Title  Pt will improve left wrist strength to 5/5 to improve ability to stabilize hand and any items held when working on cars.     Baseline  Pt lacking in radial deviation and flexion.     Time  4    Period  Weeks    Status  Partially Met      OT SHORT TERM GOAL #5   Title  Patient will increase fine motor coordination as evidenced by completing the 9 hole peg test with his left hand in '1\' 10"'  or less in order to increase use of nondominant left hand during ADLs.    Time  4    Period  Weeks      OT SHORT TERM GOAL #6   Title  Pt will be educated on and utilize AE as needed to improve ability to use left hand for handwriting tasks.     Time  4    Period  Weeks    Status  On-going      OT SHORT TERM GOAL #7   Title  Pt will be educated on using adaptive eating utensils to improve ability to cut food and bring  various textures/types of food to mouth using left hand as dominant.     Time  4    Period  Weeks    Status  On-going      OT SHORT TERM GOAL #8   Title  Pt will be educated on ways of adapting items such as golf grips to improve  ability to play golf, as well as compensating for limited grip ability.     Time  4    Period  Weeks    Status  On-going        OT Long Term Goals - 03/05/18 1246      OT LONG TERM GOAL #1   Title  Pt will achieve highest level of functioning using LUE as dominant during ADL and leisure tasks.     Time  8    Period  Weeks    Status  On-going      OT LONG TERM GOAL #2   Title  Pt will improve left grip strength by 30# to improve ability to hold golf clubs.     Baseline  Deferred due to nerve damage    Time  8    Period  Weeks    Status  Deferred      OT LONG TERM GOAL #3   Title  Pt will improve left pinch strength by 8# to improve ability to hold small items such as keys in left hand without dropping.    Baseline  Deferred due to nerve damage    Period  Weeks    Status  Deferred      OT LONG TERM GOAL #4   Title  Patient will increase fine motor coordination as evidenced by completing the 9 hole peg test with his left hand in 45" or less in order to increase use of nondominant left hand during ADLs.    Time  8    Period  Weeks    Status  On-going            Plan - 03/11/18 1500    Clinical Impression Statement  A: Focused on grip and pinch strength. Attempted to complete handgripper task with 11# for medium beads although, patient was unable to complete. Able to complete all small beads using modified method and rest breaks when needed.     Plan  P: continue to work on grip and pinch. Progress to 11# for medium when able to complete. continue to focus on strength of lateral pinch in order to increase handwriting skills       Patient will benefit from skilled therapeutic intervention in order to improve the following deficits and impairments:  Decreased activity tolerance, Decreased knowledge of use of DME, Decreased strength, Pain, Decreased coordination, Impaired UE functional use  Visit Diagnosis: Other symptoms and signs involving the musculoskeletal  system  Other lack of coordination    Problem List Patient Active Problem List   Diagnosis Date Noted  . Cervical spondylosis with myelopathy and radiculopathy 12/24/2017  . Coronary artery disease involving native coronary artery of native heart without angina pectoris 12/23/2017  . Hypercholesterolemia 12/23/2017  . Mild obesity 09/20/2016  . Edema of both legs 01/17/2013  . Long term current use of anticoagulant therapy 12/30/2012  . History of deep vein thrombosis (DVT) of lower extremity 12/26/2012  . Dyslipidemia 12/26/2012  . Coronary atherosclerosis without stenosis 12/26/2012   Ailene Ravel, OTR/L,CBIS  [phone]  03/11/2018, 3:02 PM  [facility] [address]  [place], [place], [address] Phone: [phone]   Fax:  [phone]  Name: Casey Castillo MRN: [id number] Date of Birth: May 04, 1946

## 2018-03-13 ENCOUNTER — Encounter (HOSPITAL_COMMUNITY): Payer: Self-pay

## 2018-03-13 ENCOUNTER — Ambulatory Visit (HOSPITAL_COMMUNITY): Payer: PPO

## 2018-03-13 DIAGNOSIS — R29898 Other symptoms and signs involving the musculoskeletal system: Secondary | ICD-10-CM | POA: Diagnosis not present

## 2018-03-13 DIAGNOSIS — R278 Other lack of coordination: Secondary | ICD-10-CM

## 2018-03-13 NOTE — Therapy (Signed)
[facility] [address] [place], [place], [address] Phone: [phone]   Fax:  [phone]  Occupational Therapy Treatment  Patient Details  Name: Casey Castillo MRN: [id number] Date of Birth: 09/28/1945 Referring Provider (OT): Kristeen Miss MD   Encounter Date: 03/13/2018  OT End of Session - 03/13/18 0919    Visit Number  18    Number of Visits  22    Date for OT Re-Evaluation  03/28/18    Authorization Type  [employer]-$15 copay    Authorization Time Period  visits based on medical necessity    OT Start Time  0910    OT Stop Time  0945    OT Time Calculation (min)  35 min    Activity Tolerance  Patient tolerated treatment well    Behavior During Therapy  [facility] for tasks assessed/performed       Past Medical History:  Diagnosis Date  . Arthritis   . Cervical myelopathy with cervical radiculopathy   . Coronary artery disease    5%blockage on left anterior descending Dr. Sallyanne Kuster   . Diverticulitis    hx of  . Enlarged prostate    takes flomax  . GERD (gastroesophageal reflux disease)   . History of blood clots    november, 5 1995  . Hyperlipidemia    primary Dr. Selena Batten  . Kidney stone    hx of sees Dr. Chauncey Cruel. [place] [facility]  . Pneumonia    as a child  . PTSD (post-traumatic stress disorder)   . Seasonal allergies   . Urinary frequency    hx of     Past Surgical History:  Procedure Laterality Date  . ANTERIOR CERVICAL DECOMP/DISCECTOMY FUSION  01/28/2012   Procedure: ANTERIOR CERVICAL DECOMPRESSION/DISCECTOMY FUSION 2 LEVELS;  Surgeon: Kristeen Miss, MD;  Location: [facility];  Service: Neurosurgery;  Laterality: N/A;  Cervical six-seven, cervical seven-thoracic one Anterior cervical decompression/diskectomy/fusion  . ANTERIOR CERVICAL DECOMP/DISCECTOMY FUSION N/A 12/24/2017   Procedure: ANTERIOR CERVICAL DECOMPRESSION/DISCECTOMY FUSION CERVICAL FOUR-FIVE/CERVICAL FIVE-SIX TWO LEVELS;  Surgeon: Kristeen Miss, MD;   Location: [facility];  Service: Neurosurgery;  Laterality: N/A;  ANTERIOR CERVICAL DECOMPRESSION/DISCECTOMY FUSION CERVICAL FOUR-FIVE/CERVICAL FIVE-SIX TWO LEVELS  . CARDIAC CATHETERIZATION     09-06-11   . CARDIOVASCULAR STRESS TEST     2006 and [facility] at [facility] vascular, 2011 myoview and echo  . COLONOSCOPY    . COLONOSCOPY N/A 08/30/2015   Procedure: COLONOSCOPY;  Surgeon: Aviva Signs, MD;  [facility];  Service: Gastroenterology;  Laterality: N/A;  . EYE SURGERY     Laser for torn retina  . HEMORRHOID SURGERY     sept 8, 2008  . KIDNEY STONE SURGERY     lithotripsy 1980's  . KNEE SURGERY     left 04-11-94 ( has had two surgeries to left knee)  . NASAL SINUS SURGERY     Jun 30 1998  . NM MYOVIEW LTD  07/20/2009   no ischemia  . PILONIDAL CYST / SINUS EXCISION     surgery 01-08-74  . SHOULDER SURGERY     right 12-20-2003  . TRIGGER FINGER RELEASE     03-21-98 right hand ring finger  . US ECHOCARDIOGRAPHY  07/20/2009   LA & RA mildly dilated,trace MR,TR.    There were no vitals filed for this visit.  Subjective Assessment - 03/13/18 0916    Subjective   S: I've been working with my clothespins at home while I'm  watching tv.    Currently in Pain?  No/denies         [facility] OT Assessment - 03/13/18 0917      Assessment   Medical Diagnosis  LUE weakness    Referring Provider (OT)  Kristeen Miss MD      Precautions   Precautions  Cervical    Precaution Comments  Anterior Cervical Decompression of C4-5, C5-6 on 12/24/17                OT Treatments/Exercises (OP) - 03/13/18 0918      Exercises   Exercises  Hand;Wrist;Theraputty      Hand Exercises   Other Hand Exercises  Focusing on lateral pinch while using yellow resistive clothespin to pick up 30 sponges.     Other Hand Exercises  Focusing on lateral pinch patient placed yellow, red, and green resistive clothespin; several rest breaks needed due to hand fatigue. Max difficulty with  maintaining lateral pinch.       Neurological Re-education Exercises   Thumb Opposition  Orange hand eggsercizer; 2 sets 10X; lateral pinch      Fine Motor Coordination (Hand/Wrist)   Fine Motor Coordination  Small Pegboard    Small Pegboard  Colored pegboard utilized with tweezers while using a lateral pinch to place and remove pegs from board.               OT Short Term Goals - 03/05/18 1246      OT SHORT TERM GOAL #1   Title  Pt will be provided with and educated on HEP for improved use of left hand as dominant during ADLs.     Time  4    Period  Weeks    Status  On-going      OT SHORT TERM GOAL #2   Title  Pt will improve left grip strength by 10# to improve ability to grasp and hold lightweight items in the left hand during ADLs.     Baseline  Due to nerve damage goal revised to 10# from 15#. Pt has gained 5# (now at 7# from initial 2#)    Time  4    Period  Weeks    Status  On-going      OT SHORT TERM GOAL #3   Title  Pt will improve left pinch strength by 4# to improve ability to using writing utensils with with left hand.     Time  4    Period  Weeks    Status  On-going      OT SHORT TERM GOAL #4   Title  Pt will improve left wrist strength to 5/5 to improve ability to stabilize hand and any items held when working on cars.     Baseline  Pt lacking in radial deviation and flexion.     Time  4    Period  Weeks    Status  Partially Met      OT SHORT TERM GOAL #5   Title  Patient will increase fine motor coordination as evidenced by completing the 9 hole peg test with his left hand in '1\' 10"'  or less in order to increase use of nondominant left hand during ADLs.    Time  4    Period  Weeks      OT SHORT TERM GOAL #6   Title  Pt will be educated on and utilize AE as needed to improve ability to use left hand for handwriting tasks.  Time  4    Period  Weeks    Status  On-going      OT SHORT TERM GOAL #7   Title  Pt will be educated on using adaptive  eating utensils to improve ability to cut food and bring various textures/types of food to mouth using left hand as dominant.     Time  4    Period  Weeks    Status  On-going      OT SHORT TERM GOAL #8   Title  Pt will be educated on ways of adapting items such as golf grips to improve ability to play golf, as well as compensating for limited grip ability.     Time  4    Period  Weeks    Status  On-going        OT Long Term Goals - 03/05/18 1246      OT LONG TERM GOAL #1   Title  Pt will achieve highest level of functioning using LUE as dominant during ADL and leisure tasks.     Time  8    Period  Weeks    Status  On-going      OT LONG TERM GOAL #2   Title  Pt will improve left grip strength by 30# to improve ability to hold golf clubs.     Baseline  Deferred due to nerve damage    Time  8    Period  Weeks    Status  Deferred      OT LONG TERM GOAL #3   Title  Pt will improve left pinch strength by 8# to improve ability to hold small items such as keys in left hand without dropping.    Baseline  Deferred due to nerve damage    Period  Weeks    Status  Deferred      OT LONG TERM GOAL #4   Title  Patient will increase fine motor coordination as evidenced by completing the 9 hole peg test with his left hand in 45" or less in order to increase use of nondominant left hand during ADLs.    Time  8    Period  Weeks    Status  On-going            Plan - 03/13/18 1131    Clinical Impression Statement  A: Attempt 11# resistance on handgripper once more and patient was unable to squeeze the handles together. Sesson focused on increasing strength and use of lateral pinch to increase functional pinch needed for daily tasks and holding writing utensil. Patient required a moderate amount of verbal cueing to sustain the lateral pinch as he would revert back to a finger pad tip pinch.     Plan  P: Continue to work on grip and pinch. Use lateral pinch to help develop a moderate grip  on writing utensil for better control.     Consulted and Agree with Plan of Care  Patient       Patient will benefit from skilled therapeutic intervention in order to improve the following deficits and impairments:  Decreased activity tolerance, Decreased knowledge of use of DME, Decreased strength, Pain, Decreased coordination, Impaired UE functional use  Visit Diagnosis: Other symptoms and signs involving the musculoskeletal system  Other lack of coordination    Problem List Patient Active Problem List   Diagnosis Date Noted  . Cervical spondylosis with myelopathy and radiculopathy 12/24/2017  . Coronary artery disease involving native coronary artery  of native heart without angina pectoris 12/23/2017  . Hypercholesterolemia 12/23/2017  . Mild obesity 09/20/2016  . Edema of both legs 01/17/2013  . Long term current use of anticoagulant therapy 12/30/2012  . History of deep vein thrombosis (DVT) of lower extremity 12/26/2012  . Dyslipidemia 12/26/2012  . Coronary atherosclerosis without stenosis 12/26/2012   Ailene Ravel, OTR/L,CBIS  [phone]  03/13/2018, 11:34 AM  [facility] [address] [place], [place], [address] Phone: [phone]   Fax:  [phone]  Name: NKOSI CORTRIGHT MRN: [id number] Date of Birth: [birth year]

## 2018-03-16 ENCOUNTER — Ambulatory Visit
Admission: RE | Admit: 2018-03-16 | Discharge: 2018-03-16 | Disposition: A | Discharge status: Home / Self Care | Payer: PPO | Source: Ambulatory Visit | Attending: Neurological Surgery | Admitting: Neurological Surgery

## 2018-03-16 DIAGNOSIS — G540 Brachial plexus disorders: Secondary | ICD-10-CM

## 2018-03-18 ENCOUNTER — Encounter (HOSPITAL_COMMUNITY): Payer: Self-pay | Admitting: Occupational Therapy

## 2018-03-18 ENCOUNTER — Ambulatory Visit (HOSPITAL_COMMUNITY): Payer: PPO | Admitting: Occupational Therapy

## 2018-03-18 ENCOUNTER — Telehealth (HOSPITAL_COMMUNITY): Payer: Self-pay | Admitting: Occupational Therapy

## 2018-03-18 DIAGNOSIS — R29898 Other symptoms and signs involving the musculoskeletal system: Secondary | ICD-10-CM

## 2018-03-18 DIAGNOSIS — R278 Other lack of coordination: Secondary | ICD-10-CM

## 2018-03-18 NOTE — Telephone Encounter (Signed)
Patient changed his appt to Friday

## 2018-03-18 NOTE — Therapy (Addendum)
[facility] [address] [place], [place], [address] Phone: [phone]   Fax:  [phone]  Occupational Therapy Treatment  Patient Details  Name: Casey Castillo MRN: [id number] Date of Birth: Sep 27, 1945 Referring Provider (OT): Kristeen Miss MD   Encounter Date: 03/18/2018  OT End of Session - 03/18/18 1212    Visit Number  19    Number of Visits  22    Date for OT Re-Evaluation  03/28/18    Authorization Type  [employer]15 copay    Authorization Time Period  visits based on medical necessity    OT Start Time  1120    OT Stop Time  1206    OT Time Calculation (min)  46 min    Activity Tolerance  Patient tolerated treatment well    Behavior During Therapy  [facility] for tasks assessed/performed       Past Medical History:  Diagnosis Date  . Arthritis   . Cervical myelopathy with cervical radiculopathy   . Coronary artery disease    5%blockage on left anterior descending Dr. Sallyanne Kuster   . Diverticulitis    hx of  . Enlarged prostate    takes flomax  . GERD (gastroesophageal reflux disease)   . History of blood clots    november, 5 1995  . Hyperlipidemia    primary Dr. Selena Batten  . Kidney stone    hx of sees Dr. Chauncey Cruel. [place] [facility]  . Pneumonia    as a child  . PTSD (post-traumatic stress disorder)   . Seasonal allergies   . Urinary frequency    hx of     Past Surgical History:  Procedure Laterality Date  . ANTERIOR CERVICAL DECOMP/DISCECTOMY FUSION  01/28/2012   Procedure: ANTERIOR CERVICAL DECOMPRESSION/DISCECTOMY FUSION 2 LEVELS;  Surgeon: Kristeen Miss, MD;  Location: [facility];  Service: Neurosurgery;  Laterality: N/A;  Cervical six-seven, cervical seven-thoracic one Anterior cervical decompression/diskectomy/fusion  . ANTERIOR CERVICAL DECOMP/DISCECTOMY FUSION N/A 12/24/2017   Procedure: ANTERIOR CERVICAL DECOMPRESSION/DISCECTOMY FUSION CERVICAL FOUR-FIVE/CERVICAL FIVE-SIX TWO LEVELS;  Surgeon: Kristeen Miss, MD;   Location: [facility];  Service: Neurosurgery;  Laterality: N/A;  ANTERIOR CERVICAL DECOMPRESSION/DISCECTOMY FUSION CERVICAL FOUR-FIVE/CERVICAL FIVE-SIX TWO LEVELS  . CARDIAC CATHETERIZATION     09-06-11   . CARDIOVASCULAR STRESS TEST     2006 and 2008 at [facility] heart and vascular, 2011 myoview and echo  . COLONOSCOPY    . COLONOSCOPY N/A 08/30/2015   Procedure: COLONOSCOPY;  Surgeon: Aviva Signs, MD;  [facility];  Service: Gastroenterology;  Laterality: N/A;  . EYE SURGERY     Laser for torn retina  . HEMORRHOID SURGERY     sept 8, 2008  . KIDNEY STONE SURGERY     lithotripsy 1980's  . KNEE SURGERY     left 04-11-94 ( has had two surgeries to left knee)  . NASAL SINUS SURGERY     Jun 30 1998  . NM MYOVIEW LTD  07/20/2009   no ischemia  . PILONIDAL CYST / SINUS EXCISION     surgery 01-08-74  . SHOULDER SURGERY     right 12-20-2003  . TRIGGER FINGER RELEASE     03-21-98 right hand ring finger  . US ECHOCARDIOGRAPHY  07/20/2009   LA & RA mildly dilated,trace MR,TR.    There were no vitals filed for this visit.  Subjective Assessment - 03/18/18 1148    Subjective   S: I'd like to be able to golf again.  Currently in Pain?  No/denies    Multiple Pain Sites  No         [facility] OT Assessment - 03/18/18 1118      Assessment   Medical Diagnosis  LUE weakness      Precautions   Precautions  Cervical    Precaution Comments  Anterior Cervical Decompression of C4-5, C5-6 on 12/24/17                OT Treatments/Exercises (OP) - 03/18/18 1119      ADLs   Writing  Pt practiced tracing lines of picture, using dynamic tripod grasp, with max difficulty completing task; then continued to use small isolated movements to practice small circle drawing    ADL Comments  Provided pt with education and websites available for adapted golf clubs. Pt verbalized understanding of education provided.       Exercises   Exercises  Hand;Wrist;Theraputty      Additional Wrist  Exercises   Sponges  Focusing on lateral pinch while using yellow resistive clothespin to pick up 30 sponges with no rest breaks needed.       Hand Exercises   Other Hand Exercises  Focusing on lateral pinch patient placed yellow, red, and green resistive clothespin with several rest breaks needed due to hand fatigue. All       Theraputty   Theraputty - Pinch  Red putty in seated using lateral pinch 10X with mod difficulty due to fatigue      Neurological Re-education Exercises   Thumb Opposition  Green hand eggsercizer using lateral pinch; 2 sets 10X      Fine Motor Coordination (Hand/Wrist)   Fine Motor Coordination  Small Pegboard    Small Pegboard  Colored pegboard utilized with tweezers while using a lateral pinch to place and remove pegs from board.               OT Short Term Goals - 03/05/18 1246      OT SHORT TERM GOAL #1   Title  Pt will be provided with and educated on HEP for improved use of left hand as dominant during ADLs.     Time  4    Period  Weeks    Status  On-going      OT SHORT TERM GOAL #2   Title  Pt will improve left grip strength by 10# to improve ability to grasp and hold lightweight items in the left hand during ADLs.     Baseline  Due to nerve damage goal revised to 10# from 15#. Pt has gained 5# (now at 7# from initial 2#)    Time  4    Period  Weeks    Status  On-going      OT SHORT TERM GOAL #3   Title  Pt will improve left pinch strength by 4# to improve ability to using writing utensils with with left hand.     Time  4    Period  Weeks    Status  On-going      OT SHORT TERM GOAL #4   Title  Pt will improve left wrist strength to 5/5 to improve ability to stabilize hand and any items held when working on cars.     Baseline  Pt lacking in radial deviation and flexion.     Time  4    Period  Weeks    Status  Partially Met      OT SHORT TERM GOAL #5  Title  Patient will increase fine motor coordination as evidenced by completing  the 9 hole peg test with his left hand in '1\' 10"'  or less in order to increase use of nondominant left hand during ADLs.    Time  4    Period  Weeks      OT SHORT TERM GOAL #6   Title  Pt will be educated on and utilize AE as needed to improve ability to use left hand for handwriting tasks.     Time  4    Period  Weeks    Status  On-going      OT SHORT TERM GOAL #7   Title  Pt will be educated on using adaptive eating utensils to improve ability to cut food and bring various textures/types of food to mouth using left hand as dominant.     Time  4    Period  Weeks    Status  On-going      OT SHORT TERM GOAL #8   Title  Pt will be educated on ways of adapting items such as golf grips to improve ability to play golf, as well as compensating for limited grip ability.     Time  4    Period  Weeks    Status  On-going        OT Long Term Goals - 03/05/18 1246      OT LONG TERM GOAL #1   Title  Pt will achieve highest level of functioning using LUE as dominant during ADL and leisure tasks.     Time  8    Period  Weeks    Status  On-going      OT LONG TERM GOAL #2   Title  Pt will improve left grip strength by 30# to improve ability to hold golf clubs.     Baseline  Deferred due to nerve damage    Time  8    Period  Weeks    Status  Deferred      OT LONG TERM GOAL #3   Title  Pt will improve left pinch strength by 8# to improve ability to hold small items such as keys in left hand without dropping.    Baseline  Deferred due to nerve damage    Period  Weeks    Status  Deferred      OT LONG TERM GOAL #4   Title  Patient will increase fine motor coordination as evidenced by completing the 9 hole peg test with his left hand in 45" or less in order to increase use of nondominant left hand during ADLs.    Time  8    Period  Weeks    Status  On-going            Plan - 03/18/18 1150    Clinical Impression Statement  A: Focused on increasing hand strength and using lateral  pinch this session. Added lateral pinch activity with therputty, mod difficulty noted, to increase functional pinch needed for writing tasks using dominant left hand. Verbal cuing for form and technique.     Plan  P: Continue grip and pinch strengthening. Continue practicing lateral pinch tasks. Introduce new red rubber band exercises next session.    Consulted and Agree with Plan of Care  Patient       Patient will benefit from skilled therapeutic intervention in order to improve the following deficits and impairments:  Decreased activity tolerance, Decreased knowledge of use of  DME, Decreased strength, Pain, Decreased coordination, Impaired UE functional use  Visit Diagnosis: Other lack of coordination  Muscle weakness (generalized)  Other symptoms and signs involving the musculoskeletal system    Problem List Patient Active Problem List   Diagnosis Date Noted  . Cervical spondylosis with myelopathy and radiculopathy 12/24/2017  . Coronary artery disease involving native coronary artery of native heart without angina pectoris 12/23/2017  . Hypercholesterolemia 12/23/2017  . Mild obesity 09/20/2016  . Edema of both legs 01/17/2013  . Long term current use of anticoagulant therapy 12/30/2012  . History of deep vein thrombosis (DVT) of lower extremity 12/26/2012  . Dyslipidemia 12/26/2012  . Coronary atherosclerosis without stenosis 12/26/2012    Toney Rakes, OT student 03/18/2018, 1:42 PM  [facility] [address] [place], [place], [address] Phone: [phone]   Fax:  [phone]  Name: Casey Castillo MRN: [id number] Date of Birth: September 29, 1945

## 2018-03-20 ENCOUNTER — Encounter (HOSPITAL_COMMUNITY): Payer: Self-pay | Admitting: Occupational Therapy

## 2018-03-21 ENCOUNTER — Encounter (HOSPITAL_COMMUNITY): Payer: Self-pay | Admitting: Occupational Therapy

## 2018-03-21 ENCOUNTER — Ambulatory Visit (HOSPITAL_COMMUNITY): Payer: PPO | Admitting: Occupational Therapy

## 2018-03-21 DIAGNOSIS — R278 Other lack of coordination: Secondary | ICD-10-CM

## 2018-03-21 DIAGNOSIS — R29898 Other symptoms and signs involving the musculoskeletal system: Secondary | ICD-10-CM

## 2018-03-21 NOTE — Therapy (Addendum)
[facility] [address] [place], [place], [address] Phone: [phone]   Fax:  [phone]  Occupational Therapy Treatment  Patient Details  Name: Casey Castillo MRN: [id number] Date of Birth: [birth year] Referring Provider (OT): Kristeen Miss MD   Encounter Date: 03/21/2018  OT End of Session - 03/21/18 1125    Visit Number  20    Number of Visits  22    Date for OT Re-Evaluation  03/28/18    Authorization Type  [employer]-$15 copay    Authorization Time Period  visits based on medical necessity    OT Start Time  0940    OT Stop Time  1025    OT Time Calculation (min)  45 min    Activity Tolerance  Patient tolerated treatment well    Behavior During Therapy  [facility] for tasks assessed/performed       Past Medical History:  Diagnosis Date  . Arthritis   . Cervical myelopathy with cervical radiculopathy   . Coronary artery disease    5%blockage on left anterior descending Dr. Sallyanne Kuster   . Diverticulitis    hx of  . Enlarged prostate    takes flomax  . GERD (gastroesophageal reflux disease)   . History of blood clots    november, 5 1995  . Hyperlipidemia    primary Dr. Selena Batten  . Kidney stone    hx of sees Dr. Chauncey Cruel. [place] [facility]  . Pneumonia    as a child  . PTSD (post-traumatic stress disorder)   . Seasonal allergies   . Urinary frequency    hx of     Past Surgical History:  Procedure Laterality Date  . ANTERIOR CERVICAL DECOMP/DISCECTOMY FUSION  01/28/2012   Procedure: ANTERIOR CERVICAL DECOMPRESSION/DISCECTOMY FUSION 2 LEVELS;  Surgeon: Kristeen Miss, MD;  Location: [facility];  Service: Neurosurgery;  Laterality: N/A;  Cervical six-seven, cervical seven-thoracic one Anterior cervical decompression/diskectomy/fusion  . ANTERIOR CERVICAL DECOMP/DISCECTOMY FUSION N/A 12/24/2017   Procedure: ANTERIOR CERVICAL DECOMPRESSION/DISCECTOMY FUSION CERVICAL FOUR-FIVE/CERVICAL FIVE-SIX TWO LEVELS;  Surgeon: Kristeen Miss, MD;   Location: [facility];  Service: Neurosurgery;  Laterality: N/A;  ANTERIOR CERVICAL DECOMPRESSION/DISCECTOMY FUSION CERVICAL FOUR-FIVE/CERVICAL FIVE-SIX TWO LEVELS  . CARDIAC CATHETERIZATION     09-06-11   . CARDIOVASCULAR STRESS TEST     2006 and 2008 at [facility] heart and vascular, 2011 myoview and echo  . COLONOSCOPY    . COLONOSCOPY N/A 08/30/2015   Procedure: COLONOSCOPY;  Surgeon: Aviva Signs, MD;  [facility];  Service: Gastroenterology;  Laterality: N/A;  . EYE SURGERY     Laser for torn retina  . HEMORRHOID SURGERY     sept 8, 2008  . KIDNEY STONE SURGERY     lithotripsy 1980's  . KNEE SURGERY     left 04-11-94 ( has had two surgeries to left knee)  . NASAL SINUS SURGERY     Jun 30 1998  . NM MYOVIEW LTD  07/20/2009   no ischemia  . PILONIDAL CYST / SINUS EXCISION     surgery 01-08-74  . SHOULDER SURGERY     right 12-20-2003  . TRIGGER FINGER RELEASE     03-21-98 right hand ring finger  . US ECHOCARDIOGRAPHY  07/20/2009   LA & RA mildly dilated,trace MR,TR.    There were no vitals filed for this visit.  Subjective Assessment - 03/21/18 0943    Subjective   S: I've been trying to rake, but it's hard to grip  it.     Currently in Pain?  No/denies    Multiple Pain Sites  No         [facility] OT Assessment - 03/21/18 0937      Assessment   Medical Diagnosis  LUE weakness      Precautions   Precautions  Cervical    Precaution Comments  Anterior Cervical Decompression of C4-5, C5-6 on 12/24/17                OT Treatments/Exercises (OP) - 03/21/18 0937      Exercises   Exercises  Hand;Wrist;Theraputty      Additional Wrist Exercises   Sponges  Focusing on lateral pinch while using resistive clothespin to pick up 30 sponges. Pt able to pick up 5 sponges with yellow pin and then progressed to using red pin for remaining 25 sponges with min difficulty due to fatigue.       Hand Exercises   Other Hand Exercises  Focusing on lateral pinch patient placed  yellow, red, green, and added blue resistive clothespin with several rest breaks needed using blue pins to hand fatigue and max difficulty.       Theraputty   Theraputty Hand- Locate Pegs  Located 5 pegs using red putty with mod fatigue noted.      Neurological Re-education Exercises   Thumb Opposition  Hand eggsercizer using lateral pinch; orange and green eggs: 2 sets of 10X each      Fine Motor Coordination (Hand/Wrist)   Fine Motor Coordination  Small Pegboard    Small Pegboard  Colored pegboard utilized with tweezers while using a lateral pinch to place and remove pegs from board.               OT Short Term Goals - 03/05/18 1246      OT SHORT TERM GOAL #1   Title  Pt will be provided with and educated on HEP for improved use of left hand as dominant during ADLs.     Time  4    Period  Weeks    Status  On-going      OT SHORT TERM GOAL #2   Title  Pt will improve left grip strength by 10# to improve ability to grasp and hold lightweight items in the left hand during ADLs.     Baseline  Due to nerve damage goal revised to 10# from 15#. Pt has gained 5# (now at 7# from initial 2#)    Time  4    Period  Weeks    Status  On-going      OT SHORT TERM GOAL #3   Title  Pt will improve left pinch strength by 4# to improve ability to using writing utensils with with left hand.     Time  4    Period  Weeks    Status  On-going      OT SHORT TERM GOAL #4   Title  Pt will improve left wrist strength to 5/5 to improve ability to stabilize hand and any items held when working on cars.     Baseline  Pt lacking in radial deviation and flexion.     Time  4    Period  Weeks    Status  Partially Met      OT SHORT TERM GOAL #5   Title  Patient will increase fine motor coordination as evidenced by completing the 9 hole peg test with his left hand in '1\' 10"'$  or  less in order to increase use of nondominant left hand during ADLs.    Time  4    Period  Weeks      OT SHORT TERM GOAL #6    Title  Pt will be educated on and utilize AE as needed to improve ability to use left hand for handwriting tasks.     Time  4    Period  Weeks    Status  On-going      OT SHORT TERM GOAL #7   Title  Pt will be educated on using adaptive eating utensils to improve ability to cut food and bring various textures/types of food to mouth using left hand as dominant.     Time  4    Period  Weeks    Status  On-going      OT SHORT TERM GOAL #8   Title  Pt will be educated on ways of adapting items such as golf grips to improve ability to play golf, as well as compensating for limited grip ability.     Time  4    Period  Weeks    Status  On-going        OT Long Term Goals - 03/05/18 1246      OT LONG TERM GOAL #1   Title  Pt will achieve highest level of functioning using LUE as dominant during ADL and leisure tasks.     Time  8    Period  Weeks    Status  On-going      OT LONG TERM GOAL #2   Title  Pt will improve left grip strength by 30# to improve ability to hold golf clubs.     Baseline  Deferred due to nerve damage    Time  8    Period  Weeks    Status  Deferred      OT LONG TERM GOAL #3   Title  Pt will improve left pinch strength by 8# to improve ability to hold small items such as keys in left hand without dropping.    Baseline  Deferred due to nerve damage    Period  Weeks    Status  Deferred      OT LONG TERM GOAL #4   Title  Patient will increase fine motor coordination as evidenced by completing the 9 hole peg test with his left hand in 45" or less in order to increase use of nondominant left hand during ADLs.    Time  8    Period  Weeks    Status  On-going            Plan - 03/21/18 0945    Clinical Impression Statement  A: Continued with focus on increasing hand strength and lateral pinch today. Added blue clothespins today with lateral pinch and pt required several rest breaks due to hand fatigue. Added peg finding activity with red putty, mod  difficulty, no rest breaks needed. Followed up with pt and he has ordered adapted equipment for golf that was discussed last session. Verbal cuing for form and technique.     Plan  P: Continue with grip and lateral pinch strengthening activities next session, progressing as tolerated. Add card sliding activity using lateral pinch.     Consulted and Agree with Plan of Care  Patient       Patient will benefit from skilled therapeutic intervention in order to improve the following deficits and impairments:  Decreased activity tolerance, Decreased  knowledge of use of DME, Decreased strength, Pain, Decreased coordination, Impaired UE functional use  Visit Diagnosis: Other lack of coordination  Muscle weakness (generalized)  Other symptoms and signs involving the musculoskeletal system    Problem List Patient Active Problem List   Diagnosis Date Noted  . Cervical spondylosis with myelopathy and radiculopathy 12/24/2017  . Coronary artery disease involving native coronary artery of native heart without angina pectoris 12/23/2017  . Hypercholesterolemia 12/23/2017  . Mild obesity 09/20/2016  . Edema of both legs 01/17/2013  . Long term current use of anticoagulant therapy 12/30/2012  . History of deep vein thrombosis (DVT) of lower extremity 12/26/2012  . Dyslipidemia 12/26/2012  . Coronary atherosclerosis without stenosis 12/26/2012    Casey Castillo, OT student  03/21/2018, 5:00 PM  [facility] [address] [place], [place], [address] Phone: [phone]   Fax:  [phone]  Name: Casey Castillo MRN: [id number] Date of Birth: 01/06/1946

## 2018-03-24 ENCOUNTER — Other Ambulatory Visit: Payer: Self-pay | Admitting: Neurological Surgery

## 2018-03-25 ENCOUNTER — Ambulatory Visit (HOSPITAL_COMMUNITY): Payer: PPO | Admitting: Occupational Therapy

## 2018-03-25 ENCOUNTER — Encounter (HOSPITAL_COMMUNITY): Payer: Self-pay | Admitting: Occupational Therapy

## 2018-03-25 ENCOUNTER — Other Ambulatory Visit: Payer: Self-pay | Admitting: Neurological Surgery

## 2018-03-25 DIAGNOSIS — R29898 Other symptoms and signs involving the musculoskeletal system: Secondary | ICD-10-CM | POA: Diagnosis not present

## 2018-03-25 DIAGNOSIS — G540 Brachial plexus disorders: Secondary | ICD-10-CM

## 2018-03-25 DIAGNOSIS — R278 Other lack of coordination: Secondary | ICD-10-CM

## 2018-03-25 NOTE — Therapy (Addendum)
[facility] [address] [place], [place], [address] Phone: [phone]   Fax:  [phone]  Occupational Therapy Treatment  Patient Details  Name: Casey Castillo MRN: [id number] Date of Birth: June 03, 1946 Referring Provider (OT): Kristeen Miss MD   Encounter Date: 03/25/2018  OT End of Session - 03/25/18 1108    Visit Number  21    Number of Visits  22    Date for OT Re-Evaluation  03/28/18    Authorization Type  [employer]-$15 copay    Authorization Time Period  visits based on medical necessity    OT Start Time  1033    OT Stop Time  1115    OT Time Calculation (min)  42 min    Activity Tolerance  Patient tolerated treatment well    Behavior During Therapy  [facility] for tasks assessed/performed       Past Medical History:  Diagnosis Date  . Arthritis   . Cervical myelopathy with cervical radiculopathy   . Coronary artery disease    5%blockage on left anterior descending Dr. Sallyanne Kuster   . Diverticulitis    hx of  . Enlarged prostate    takes flomax  . GERD (gastroesophageal reflux disease)   . History of blood clots    november, 5 1995  . Hyperlipidemia    primary Dr. Selena Batten  . Kidney stone    hx of sees Dr. Chauncey Cruel. [place]  . Pneumonia    as a child  . PTSD (post-traumatic stress disorder)   . Seasonal allergies   . Urinary frequency    hx of     Past Surgical History:  Procedure Laterality Date  . ANTERIOR CERVICAL DECOMP/DISCECTOMY FUSION  01/28/2012   Procedure: ANTERIOR CERVICAL DECOMPRESSION/DISCECTOMY FUSION 2 LEVELS;  Surgeon: Kristeen Miss, MD;  Location: [facility];  Service: Neurosurgery;  Laterality: N/A;  Cervical six-seven, cervical seven-thoracic one Anterior cervical decompression/diskectomy/fusion  . ANTERIOR CERVICAL DECOMP/DISCECTOMY FUSION N/A 12/24/2017   Procedure: ANTERIOR CERVICAL DECOMPRESSION/DISCECTOMY FUSION CERVICAL FOUR-FIVE/CERVICAL FIVE-SIX TWO LEVELS;  Surgeon: Kristeen Miss, MD;   Location: [facility];  Service: Neurosurgery;  Laterality: N/A;  ANTERIOR CERVICAL DECOMPRESSION/DISCECTOMY FUSION CERVICAL FOUR-FIVE/CERVICAL FIVE-SIX TWO LEVELS  . CARDIAC CATHETERIZATION     09-06-11   . CARDIOVASCULAR STRESS TEST     2006 and 2008 at [facility] heart [facility] vascular, 2011 myoview and echo  . COLONOSCOPY    . COLONOSCOPY N/A 08/30/2015   Procedure: COLONOSCOPY;  Surgeon: Aviva Signs, MD;  [facility];  Service: Gastroenterology;  Laterality: N/A;  . EYE SURGERY     Laser for torn retina  . HEMORRHOID SURGERY     sept 8, 2008  . KIDNEY STONE SURGERY     lithotripsy 1980's  . KNEE SURGERY     left 04-11-94 ( has had two surgeries to left knee)  . NASAL SINUS SURGERY     Jun 30 1998  . NM MYOVIEW LTD  07/20/2009   no ischemia  . PILONIDAL CYST / SINUS EXCISION     surgery 01-08-74  . SHOULDER SURGERY     right 12-20-2003  . TRIGGER FINGER RELEASE     03-21-98 right hand ring finger  . US ECHOCARDIOGRAPHY  07/20/2009   LA & RA mildly dilated,trace MR,TR.    There were no vitals filed for this visit.  Subjective Assessment - 03/25/18 1051    Subjective   S: I've been staying busy working a lot today.  Currently in Pain?  No/denies    Multiple Pain Sites  No         [facility] OT Assessment - 03/25/18 1041      Assessment   Medical Diagnosis  LUE weakness      Precautions   Precautions  Cervical    Precaution Comments  Anterior Cervical Decompression of C4-5, C5-6 on 12/24/17                OT Treatments/Exercises (OP) - 03/25/18 1041      Exercises   Exercises  Hand;Wrist;Theraputty      Additional Wrist Exercises   Sponges  Focusing on lateral pinch while using red resistive clothespin to pick up 30 sponges with min difficulty due to fatigue.       Hand Exercises   Other Hand Exercises  Grasp and release green eggsercise egg 15X; 2 reps; mod fatigue noted    Other Hand Exercises  Focusing on lateral pinch patient placed green and blue  resistive clothespin with several rest breaks needed due to hand fatigue and max difficulty.       Theraputty   Theraputty - Flatten  red in seated    Theraputty - Roll  red in seated    Theraputty - Grip  red in seated    Theraputty - Pinch  Red putty in seated using lateral pinch 10X with mod difficulty due to fatigue      Fine Motor Coordination (Hand/Wrist)   Fine Motor Coordination  Dealing card with thumb   and sponge stacking activity; tolerated well               OT Short Term Goals - 03/05/18 1246      OT SHORT TERM GOAL #1   Title  Pt will be provided with and educated on HEP for improved use of left hand as dominant during ADLs.     Time  4    Period  Weeks    Status  On-going      OT SHORT TERM GOAL #2   Title  Pt will improve left grip strength by 10# to improve ability to grasp and hold lightweight items in the left hand during ADLs.     Baseline  Due to nerve damage goal revised to 10# from 15#. Pt has gained 5# (now at 7# from initial 2#)    Time  4    Period  Weeks    Status  On-going      OT SHORT TERM GOAL #3   Title  Pt will improve left pinch strength by 4# to improve ability to using writing utensils with with left hand.     Time  4    Period  Weeks    Status  On-going      OT SHORT TERM GOAL #4   Title  Pt will improve left wrist strength to 5/5 to improve ability to stabilize hand and any items held when working on cars.     Baseline  Pt lacking in radial deviation and flexion.     Time  4    Period  Weeks    Status  Partially Met      OT SHORT TERM GOAL #5   Title  Patient will increase fine motor coordination as evidenced by completing the 9 hole peg test with his left hand in '1\' 10"'  or less in order to increase use of nondominant left hand during ADLs.    Time  4  Period  Weeks      OT SHORT TERM GOAL #6   Title  Pt will be educated on and utilize AE as needed to improve ability to use left hand for handwriting tasks.     Time  4     Period  Weeks    Status  On-going      OT SHORT TERM GOAL #7   Title  Pt will be educated on using adaptive eating utensils to improve ability to cut food and bring various textures/types of food to mouth using left hand as dominant.     Time  4    Period  Weeks    Status  On-going      OT SHORT TERM GOAL #8   Title  Pt will be educated on ways of adapting items such as golf grips to improve ability to play golf, as well as compensating for limited grip ability.     Time  4    Period  Weeks    Status  On-going        OT Long Term Goals - 03/05/18 1246      OT LONG TERM GOAL #1   Title  Pt will achieve highest level of functioning using LUE as dominant during ADL and leisure tasks.     Time  8    Period  Weeks    Status  On-going      OT LONG TERM GOAL #2   Title  Pt will improve left grip strength by 30# to improve ability to hold golf clubs.     Baseline  Deferred due to nerve damage    Time  8    Period  Weeks    Status  Deferred      OT LONG TERM GOAL #3   Title  Pt will improve left pinch strength by 8# to improve ability to hold small items such as keys in left hand without dropping.    Baseline  Deferred due to nerve damage    Period  Weeks    Status  Deferred      OT LONG TERM GOAL #4   Title  Patient will increase fine motor coordination as evidenced by completing the 9 hole peg test with his left hand in 45" or less in order to increase use of nondominant left hand during ADLs.    Time  8    Period  Weeks    Status  On-going            Plan - 03/25/18 1052    Clinical Impression Statement  A: Focusing on increasing grip strength and lateral pinch strength this session. Progressed to using only blue and green clothespins today and discontinued yellow and red pins; pt required several rest breaks due to hand fatigue. Resumed theraputty exercises today. Added card sliding activity to work on coordination and pt tolerated well. Pt brought in adaptive glove  for golfing that he purchsed, but he reports that he will return it since the glove is not a tight enough fit. Verbal cuing for form and technique.     Plan  P: Complete reassessment next session and discuss progress as well as discharge with pt. Provide HEP at discharge.     Consulted and Agree with Plan of Care  Patient       Patient will benefit from skilled therapeutic intervention in order to improve the following deficits and impairments:  Decreased activity tolerance, Decreased knowledge of use of  DME, Decreased strength, Pain, Decreased coordination, Impaired UE functional use  Visit Diagnosis: Other lack of coordination  Muscle weakness (generalized)  Other symptoms and signs involving the musculoskeletal system    Problem List Patient Active Problem List   Diagnosis Date Noted  . Cervical spondylosis with myelopathy and radiculopathy 12/24/2017  . Coronary artery disease involving native coronary artery of native heart without angina pectoris 12/23/2017  . Hypercholesterolemia 12/23/2017  . Mild obesity 09/20/2016  . Edema of both legs 01/17/2013  . Long term current use of anticoagulant therapy 12/30/2012  . History of deep vein thrombosis (DVT) of lower extremity 12/26/2012  . Dyslipidemia 12/26/2012  . Coronary atherosclerosis without stenosis 12/26/2012    Toney Rakes, OT student 03/25/2018, 3:22 PM  [facility] [address] [place], [place], [address] Phone: [phone]   Fax:  [phone]  Name: LYSLE YERO MRN: [id number] Date of Birth: December 15, 1945

## 2018-03-26 ENCOUNTER — Other Ambulatory Visit: Payer: PPO

## 2018-03-26 ENCOUNTER — Ambulatory Visit
Admission: RE | Admit: 2018-03-26 | Discharge: 2018-03-26 | Disposition: A | Discharge status: Home / Self Care | Payer: PPO | Source: Ambulatory Visit | Attending: Neurological Surgery | Admitting: Neurological Surgery

## 2018-03-26 DIAGNOSIS — G540 Brachial plexus disorders: Secondary | ICD-10-CM

## 2018-03-27 ENCOUNTER — Encounter (HOSPITAL_COMMUNITY): Payer: Self-pay | Admitting: Occupational Therapy

## 2018-03-27 ENCOUNTER — Other Ambulatory Visit: Payer: Self-pay

## 2018-03-27 ENCOUNTER — Ambulatory Visit (HOSPITAL_COMMUNITY): Payer: PPO | Admitting: Occupational Therapy

## 2018-03-27 DIAGNOSIS — R29898 Other symptoms and signs involving the musculoskeletal system: Secondary | ICD-10-CM | POA: Diagnosis not present

## 2018-03-27 DIAGNOSIS — R278 Other lack of coordination: Secondary | ICD-10-CM

## 2018-03-27 NOTE — Patient Outreach (Signed)
[facility]) Care Management  03/27/2018  LEELAN RAJEWSKI [birth year] [id number]   Referral Date: 03/26/18 Referral Source: Nurseline Referral Reason: Questions about medication for anxiety prior to MRI.   Outreach Attempt: Spoke with patient.  He states hat the medication was not enough for him.  He states he has PTSD from [place].  He states that he did not get through the procedure as he could not be still.  He states that it will be rescheduled.  He states that he has called his physician this morning.  He denies any other concerns.    Plan: RN CM will close case.     Jone Baseman, RN, MSN [facility] Care Management Care Management Coordinator Direct Line [phone] Toll Free: [phone]  Fax: [phone]

## 2018-03-27 NOTE — Patient Instructions (Addendum)
Fine Motor HEP SCREW AND THREAD NUT: Hold a screw with one hand and thread a nut on the screw with the target hand. CARD SLIDE: Hold a deck of cards with your unaffected hand and slide one card at a time to the side with your affected hand.   CARD FLIP: Hold a deck of cards with your unaffected hand and flip one card at a time with your affected hand.   COIN FLIP: Place various coins on a table and flip each coin with your affected hand. CLOTHES PIN PINCH: Use 2 fingers and pinch a clothes pin or chip clip.   FINGER OPPOSITION COMBO: Start with an open palm and fingers extended. Next, touch the tips of the first and second fingers. Then return to open palm. Next, touch the tips of the first and third fingers, etc. until all fingers have performed as shown. Handwriting: Patient will hand write 250 words per day   MARBLE PICK-UPS: Pick a marble up and hold it in your hand as you pick another one up. Continue to pick one marble up at a time while holding all of them in your hand. Once your hand is full and you can no longer hold any more marbles, place one marble down on the table at a time until you have put them all down on the table. Repeat. Requires 10-15 marbles.   Finger Spheres: (Touching) With your palm up, place the 2 balls in your hand. Rotate the balls around each other using your fingers and thumb. You may try to rotate the balls first towards your thumb and then away from your thumb.    LIFT SMALL OBJECT TO 90 DEGREES: Hold small object such as a 2lb free weight or a soup can. Set the weight on your lap and then lift it up to about 90 degree flexion at the shoulder. Lean forward at your hips and set it down on a surface which is at shoulder level.  Be sure to retract your shoulder blades together when elbow is bent. Do not lift over 90 degrees at the shoulder. Do not hike shoulder. Do not bend at spine. Key grip practice with key: Hold the key with a key grip (this is the position you will put  your hands to hold the zipper as well!). Once in the key grip position, practice squeezing as hard as you can. You can try to pull the key out with the opposite hand- 1 minute. Fine motor pinch pickup: Begin with many small objects on a table in front of you. using varying methods, pick up each object. vary pickup method and rate of pick up to make the exercise more or less challenging. Optional: place each object back into container using same or different methods. Fine motor flicking: Begin with multiple soft objects on a towel in front of you. flick each object a pre-determined distance across a table or surface. make the exercise more difficult by adding rules such as: each object must travel less distance than the last, to challenge fine motor control.

## 2018-03-27 NOTE — Therapy (Addendum)
This qualified practitioner was present in the room guiding the student in service delivery. Therapy student was participating in the provision of services, and the practitioner was not engaged in treating another patient or doing other tasks at the same time.  Guadelupe Sabin, OTR/L  [phone] 03/27/2018   [facility] [address] [place], [place], [address] Phone: [phone]   Fax:  [phone]  Occupational Therapy Reassessment, Treatment Discharge Summary  Patient Details  Name: Casey Castillo MRN: [id number] Date of Birth: 08-07-1945 Referring Provider (OT): Kristeen Miss MD   Encounter Date: 03/27/2018  OT End of Session - 03/27/18 1322    Visit Number  22    Number of Visits  22    Date for OT Re-Evaluation  03/28/18    Authorization Type  [employer]-$15 copay    Authorization Time Period  visits based on medical necessity    OT Start Time  1117    OT Stop Time  1200    OT Time Calculation (min)  43 min    Activity Tolerance  Patient tolerated treatment well    Behavior During Therapy  [facility] for tasks assessed/performed       Past Medical History:  Diagnosis Date  . Arthritis   . Cervical myelopathy with cervical radiculopathy   . Coronary artery disease    5%blockage on left anterior descending Dr. Sallyanne Kuster   . Diverticulitis    hx of  . Enlarged prostate    takes flomax  . GERD (gastroesophageal reflux disease)   . History of blood clots    november, 5 1995  . Hyperlipidemia    primary Dr. Selena Batten  . Kidney stone    hx of sees Dr. Chauncey Cruel. [place] [facility]  . Pneumonia    as a child  . PTSD (post-traumatic stress disorder)   . Seasonal allergies   . Urinary frequency    hx of     Past Surgical History:  Procedure Laterality Date  . ANTERIOR CERVICAL DECOMP/DISCECTOMY FUSION  01/28/2012   Procedure: ANTERIOR CERVICAL DECOMPRESSION/DISCECTOMY FUSION 2 LEVELS;  Surgeon: Kristeen Miss, MD;  Location:  [facility];  Service: Neurosurgery;  Laterality: N/A;  Cervical six-seven, cervical seven-thoracic one Anterior cervical decompression/diskectomy/fusion  . ANTERIOR CERVICAL DECOMP/DISCECTOMY FUSION N/A 12/24/2017   Procedure: ANTERIOR CERVICAL DECOMPRESSION/DISCECTOMY FUSION CERVICAL FOUR-FIVE/CERVICAL FIVE-SIX TWO LEVELS;  Surgeon: Kristeen Miss, MD;  Location: [facility];  Service: Neurosurgery;  Laterality: N/A;  ANTERIOR CERVICAL DECOMPRESSION/DISCECTOMY FUSION CERVICAL FOUR-FIVE/CERVICAL FIVE-SIX TWO LEVELS  . CARDIAC CATHETERIZATION     09-06-11   . CARDIOVASCULAR STRESS TEST     2006 and 2008 at [facility] heart [facility] vascular, 2011 myoview and echo  . COLONOSCOPY    . COLONOSCOPY N/A 08/30/2015   Procedure: COLONOSCOPY;  Surgeon: Aviva Signs, MD;  [facility];  Service: Gastroenterology;  Laterality: N/A;  . EYE SURGERY     Laser for torn retina  . HEMORRHOID SURGERY     sept 8, 2008  . KIDNEY STONE SURGERY     lithotripsy 1980's  . KNEE SURGERY     left 04-11-94 ( has had two surgeries to left knee)  . NASAL SINUS SURGERY     Jun 30 1998  . NM MYOVIEW LTD  07/20/2009   no ischemia  . PILONIDAL CYST / SINUS EXCISION     surgery 01-08-74  . SHOULDER SURGERY     right 12-20-2003  . TRIGGER FINGER RELEASE  03-21-98 right hand ring finger  . US ECHOCARDIOGRAPHY  07/20/2009   LA & RA mildly dilated,trace MR,TR.    There were no vitals filed for this visit.  Subjective Assessment - 03/27/18 1239    Subjective   S: I feel a lot stronger now.    Currently in Pain?  No/denies    Multiple Pain Sites  No         [facility] OT Assessment - 03/27/18 1123      Assessment   Medical Diagnosis  LUE weakness      Precautions   Precautions  Cervical    Precaution Comments  Anterior Cervical Decompression of C4-5, C5-6 on 12/24/17       Coordination   Left 9 Hole Peg Test  56"   Previous 1'04"     ROM / Strength   AROM / PROM / Strength  Strength      Strength   Strength  Assessment Site  Hand;Wrist    Right/Left Wrist  Left    Left Wrist Flexion  5/5   previous 4+/5   Left Wrist Extension  5/5   previous 5/5   Left Wrist Radial Deviation  5/5   previous 4+/5   Left Wrist Ulnar Deviation  5/5   previous 5/5   Left Hand Grip (lbs)  16   previous 7   Left Hand Lateral Pinch  6 lbs   previous 4   Left Hand 3 Point Pinch  4 lbs   previous 3                      OT Education - 03/27/18 1213    Education Details  Fine Motor Activities HEP    Person(s) Educated  Patient    Methods  Explanation;Handout    Comprehension  Verbalized understanding       OT Short Term Goals - 03/27/18 1145      OT SHORT TERM GOAL #1   Title  Pt will be provided with and educated on HEP for improved use of left hand as dominant during ADLs.     Time  4    Period  Weeks    Status  Achieved      OT SHORT TERM GOAL #2   Title  Pt will improve left grip strength by 10# to improve ability to grasp and hold lightweight items in the left hand during ADLs.     Baseline  Due to nerve damage goal revised to 10# from 15#. Pt has gained 5# (now at 7# from initial 2#)    Time  4    Period  Weeks    Status  Achieved      OT SHORT TERM GOAL #3   Title  Pt will improve left pinch strength by 4# to improve ability to using writing utensils with with left hand.     Time  4    Period  Weeks    Status  Partially Met      OT SHORT TERM GOAL #4   Title  Pt will improve left wrist strength to 5/5 to improve ability to stabilize hand and any items held when working on cars.     Baseline  Pt lacking in radial deviation and flexion.     Time  4    Period  Weeks    Status  Achieved      OT SHORT TERM GOAL #5   Title  Patient will increase  fine motor coordination as evidenced by completing the 9 hole peg test with his left hand in _0  or less in order to increase use of nondominant left hand during ADLs.    Time  4    Period  Weeks    Status  Achieved      OT  SHORT TERM GOAL #6   Title  Pt will be educated on and utilize AE as needed to improve ability to use left hand for handwriting tasks.     Time  4    Period  Weeks    Status  Achieved      OT SHORT TERM GOAL #7   Title  Pt will be educated on using adaptive eating utensils to improve ability to cut food and bring various textures/types of food to mouth using left hand as dominant.     Time  4    Period  Weeks    Status  Achieved      OT SHORT TERM GOAL #8   Title  Pt will be educated on ways of adapting items such as golf grips to improve ability to play golf, as well as compensating for limited grip ability.     Time  4    Period  Weeks    Status  Achieved        OT Long Term Goals - 03/27/18 1143      OT LONG TERM GOAL #1   Title  Pt will achieve highest level of functioning using LUE as dominant during ADL and leisure tasks.     Time  8    Period  Weeks    Status  Partially Met      OT LONG TERM GOAL #2   Title  Pt will improve left grip strength by 30# to improve ability to hold golf clubs.     Baseline  Deferred due to nerve damage    Time  8    Period  Weeks    Status  Deferred      OT LONG TERM GOAL #3   Title  Pt will improve left pinch strength by 8# to improve ability to hold small items such as keys in left hand without dropping.    Baseline  Deferred due to nerve damage    Period  Weeks    Status  Deferred      OT LONG TERM GOAL #4   Title  Patient will increase fine motor coordination as evidenced by completing the 9 hole peg test with his left hand in 45" or less in order to increase use of nondominant left hand during ADLs.    Time  8    Period  Weeks    Status  Not Met            Plan - 03/27/18 1214    Clinical Impression Statement  A: Reassessment completed this session. Patient has met 7/8 short term goals. The remaining unmet goal was partially met as patient's lateral pinch strength has improved by 4# while his 3-point pinch has improved by  1 pound. While none of the long-term therapy goals have been met at this time, discharge is recommended as patient is able to continue to work on remaining deficits independently at home. HEP was updated to focus on fine motor development activities and patient verbalized understanding of education provided. Discussed progress and goal attainment with patient and he is in agreement with recommendations.      Plan  P: Discharge from OT services with HEP.      OT Home Exercise Plan  Fine Motor HEP    Consulted and Agree with Plan of Care  Patient       Patient will benefit from skilled therapeutic intervention in order to improve the following deficits and impairments:  Decreased activity tolerance, Decreased knowledge of use of DME, Decreased strength, Pain, Decreased coordination, Impaired UE functional use  Visit Diagnosis: Other symptoms and signs involving the musculoskeletal system  Muscle weakness (generalized)  Other lack of coordination    Problem List Patient Active Problem List   Diagnosis Date Noted  . Cervical spondylosis with myelopathy and radiculopathy 12/24/2017  . Coronary artery disease involving native coronary artery of native heart without angina pectoris 12/23/2017  . Hypercholesterolemia 12/23/2017  . Mild obesity 09/20/2016  . Edema of both legs 01/17/2013  . Long term current use of anticoagulant therapy 12/30/2012  . History of deep vein thrombosis (DVT) of lower extremity 12/26/2012  . Dyslipidemia 12/26/2012  . Coronary atherosclerosis without stenosis 12/26/2012    Toney Rakes, OT student  03/27/2018, 1:23 PM  [facility] [address] [place], [place], [address] Phone: [phone]   Fax:  [phone]  Name: Casey Castillo MRN: [id number] Date of Birth: 1946-01-01    OCCUPATIONAL THERAPY DISCHARGE SUMMARY  Visits from Start of Care: 22  Current functional level related to goals / functional  outcomes: See above. Pt is now able to use left hand for functional tasks with greater success during grasping, in-hand manipulation, and pinch tasks. Pt demonstrates improvement in hand strength and coordination given severity of nerve damage sustained.    Remaining deficits: Continues to have difficulty with strength and coordination of left hand and use as dominant. Left thumb is unable to complete flexion/palmar abduction.    Education / Equipment: HEP for hand strengthening and fine motor Teacher, music. Also educated on AE for assist with gripping utensils and golf clubs.  Plan: Patient agrees to discharge.  Patient goals were partially met. Patient is being discharged due to lack of progress.  ?????

## 2018-04-17 DIAGNOSIS — F4312 Post-traumatic stress disorder, chronic: Secondary | ICD-10-CM | POA: Diagnosis not present

## 2018-05-04 ENCOUNTER — Ambulatory Visit: Admission: RE | Admit: 2018-05-04 | Payer: PPO | Source: Ambulatory Visit

## 2018-05-12 DIAGNOSIS — G542 Cervical root disorders, not elsewhere classified: Secondary | ICD-10-CM | POA: Diagnosis not present

## 2018-05-12 DIAGNOSIS — M47812 Spondylosis without myelopathy or radiculopathy, cervical region: Secondary | ICD-10-CM | POA: Diagnosis not present

## 2018-05-12 DIAGNOSIS — N400 Enlarged prostate without lower urinary tract symptoms: Secondary | ICD-10-CM | POA: Diagnosis not present

## 2018-05-12 DIAGNOSIS — J329 Chronic sinusitis, unspecified: Secondary | ICD-10-CM | POA: Diagnosis not present

## 2018-05-12 DIAGNOSIS — Z1389 Encounter for screening for other disorder: Secondary | ICD-10-CM | POA: Diagnosis not present

## 2018-05-12 DIAGNOSIS — Z6831 Body mass index (BMI) 31.0-31.9, adult: Secondary | ICD-10-CM | POA: Diagnosis not present

## 2018-05-12 DIAGNOSIS — E669 Obesity, unspecified: Secondary | ICD-10-CM | POA: Diagnosis not present

## 2018-05-13 ENCOUNTER — Ambulatory Visit (INDEPENDENT_AMBULATORY_CARE_PROVIDER_SITE_OTHER): Payer: PPO | Admitting: Urology

## 2018-05-13 DIAGNOSIS — R3915 Urgency of urination: Secondary | ICD-10-CM

## 2018-05-13 DIAGNOSIS — N401 Enlarged prostate with lower urinary tract symptoms: Secondary | ICD-10-CM

## 2018-05-13 DIAGNOSIS — N5201 Erectile dysfunction due to arterial insufficiency: Secondary | ICD-10-CM | POA: Diagnosis not present

## 2018-05-27 ENCOUNTER — Other Ambulatory Visit (HOSPITAL_COMMUNITY): Payer: Self-pay | Admitting: Neurological Surgery

## 2018-05-27 DIAGNOSIS — G540 Brachial plexus disorders: Secondary | ICD-10-CM

## 2018-06-02 DIAGNOSIS — F4312 Post-traumatic stress disorder, chronic: Secondary | ICD-10-CM | POA: Diagnosis not present

## 2018-06-10 ENCOUNTER — Encounter (HOSPITAL_COMMUNITY): Payer: Self-pay | Admitting: *Deleted

## 2018-06-10 ENCOUNTER — Other Ambulatory Visit: Payer: Self-pay

## 2018-06-10 NOTE — Progress Notes (Signed)
Mr Dull denies chest pain or shortness of breath.  Patient has a history of DVT x 2 and a family history of heart diease and early death due to this.  Patient sees Dr. Sallyanne Kuster.  PCP is Dr Gerarda Fraction at [facility].

## 2018-06-11 NOTE — Anesthesia Preprocedure Evaluation (Addendum)
Anesthesia Evaluation  Patient identified by MRN, date of birth, ID band Patient awake    Reviewed: Allergy & Precautions, NPO status , Patient's Chart, lab work & pertinent test results  History of Anesthesia Complications Negative for: history of anesthetic complications  Airway Mallampati: II  TM Distance: >3 FB Neck ROM: Full    Dental  (+) Teeth Intact, Dental Advisory Given   Pulmonary former smoker,    Pulmonary exam normal breath sounds clear to auscultation       Cardiovascular + CAD  Normal cardiovascular exam Rhythm:Regular Rate:Normal  Equivocal stress echo 2018   Neuro/Psych Anxiety Cervical myelopathy s/p ACDF 12/2017    GI/Hepatic Neg liver ROS, GERD  Medicated and Controlled,  Endo/Other  negative endocrine ROS  Renal/GU negative Renal ROS     Musculoskeletal  (+) Arthritis ,   Abdominal   Peds  Hematology negative hematology ROS (+)   Anesthesia Other Findings Day of surgery medications reviewed with the patient.  Reproductive/Obstetrics                           Anesthesia Physical Anesthesia Plan  ASA: II  Anesthesia Plan: General   Post-op Pain Management:    Induction: Intravenous  PONV Risk Score and Plan: 2 and Treatment may vary due to age or medical condition, Ondansetron, Dexamethasone and Midazolam  Airway Management Planned: Oral ETT and Video Laryngoscope Planned  Additional Equipment:   Intra-op Plan:   Post-operative Plan: Extubation in OR  Informed Consent: I have reviewed the patients History and Physical, chart, labs and discussed the procedure including the risks, benefits and alternatives for the proposed anesthesia with the patient or authorized representative who has indicated his/her understanding and acceptance.   Dental advisory given  Plan Discussed with: CRNA  Anesthesia Plan Comments:        Anesthesia Quick Evaluation

## 2018-06-12 ENCOUNTER — Ambulatory Visit (HOSPITAL_COMMUNITY)
Admission: RE | Admit: 2018-06-12 | Discharge: 2018-06-12 | Disposition: A | Discharge status: Home / Self Care | Payer: PPO | Source: Ambulatory Visit | Attending: Neurological Surgery | Admitting: Neurological Surgery

## 2018-06-12 ENCOUNTER — Encounter (HOSPITAL_COMMUNITY): Payer: Self-pay | Admitting: *Deleted

## 2018-06-12 ENCOUNTER — Other Ambulatory Visit: Payer: Self-pay

## 2018-06-12 ENCOUNTER — Encounter (HOSPITAL_COMMUNITY)
Admission: RE | Disposition: A | Discharge status: Home / Self Care | Payer: Self-pay | Source: Ambulatory Visit | Attending: Neurological Surgery

## 2018-06-12 ENCOUNTER — Ambulatory Visit (HOSPITAL_COMMUNITY): Payer: PPO | Admitting: Anesthesiology

## 2018-06-12 DIAGNOSIS — G540 Brachial plexus disorders: Secondary | ICD-10-CM | POA: Insufficient documentation

## 2018-06-12 DIAGNOSIS — Z87891 Personal history of nicotine dependence: Secondary | ICD-10-CM | POA: Insufficient documentation

## 2018-06-12 DIAGNOSIS — Z981 Arthrodesis status: Secondary | ICD-10-CM | POA: Insufficient documentation

## 2018-06-12 DIAGNOSIS — R350 Frequency of micturition: Secondary | ICD-10-CM | POA: Insufficient documentation

## 2018-06-12 DIAGNOSIS — E785 Hyperlipidemia, unspecified: Secondary | ICD-10-CM | POA: Diagnosis not present

## 2018-06-12 DIAGNOSIS — E7800 Pure hypercholesterolemia, unspecified: Secondary | ICD-10-CM | POA: Diagnosis not present

## 2018-06-12 DIAGNOSIS — J9811 Atelectasis: Secondary | ICD-10-CM | POA: Diagnosis not present

## 2018-06-12 DIAGNOSIS — I10 Essential (primary) hypertension: Secondary | ICD-10-CM | POA: Diagnosis not present

## 2018-06-12 DIAGNOSIS — Z79899 Other long term (current) drug therapy: Secondary | ICD-10-CM | POA: Insufficient documentation

## 2018-06-12 DIAGNOSIS — I2510 Atherosclerotic heart disease of native coronary artery without angina pectoris: Secondary | ICD-10-CM | POA: Diagnosis not present

## 2018-06-12 DIAGNOSIS — K219 Gastro-esophageal reflux disease without esophagitis: Secondary | ICD-10-CM | POA: Diagnosis not present

## 2018-06-12 HISTORY — DX: Claustrophobia: F40.240

## 2018-06-12 HISTORY — PX: RADIOLOGY WITH ANESTHESIA: SHX6223

## 2018-06-12 LAB — BASIC METABOLIC PANEL
Anion gap: 9 (ref 5–15)
BUN: 13 mg/dL (ref 8–23)
CALCIUM: 8.6 mg/dL — AB (ref 8.9–10.3)
CO2: 25 mmol/L (ref 22–32)
Chloride: 107 mmol/L (ref 98–111)
Creatinine, Ser: 1.1 mg/dL (ref 0.61–1.24)
GFR calc Af Amer: >60 mL/min (ref >60)
GFR calc non Af Amer: >60 mL/min (ref >60)
Glucose, Bld: 110 mg/dL — ABNORMAL HIGH (ref 70–99)
Potassium: 3.9 mmol/L (ref 3.5–5.1)
SODIUM: 141 mmol/L (ref 135–145)

## 2018-06-12 SURGERY — MRI WITH ANESTHESIA
Anesthesia: General

## 2018-06-12 MED ORDER — PROMETHAZINE HCL 25 MG/ML IJ SOLN: 6.2500 mg | INTRAMUSCULAR | Status: DC | PRN

## 2018-06-12 MED ORDER — GADOBUTROL 1 MMOL/ML IV SOLN
10.0000 mL | Freq: Once | INTRAVENOUS | Status: AC | PRN
  Administered 2018-06-12: 10 mL via INTRAVENOUS

## 2018-06-12 MED ORDER — LIDOCAINE 2% (20 MG/ML) 5 ML SYRINGE
INTRAMUSCULAR | Status: DC | PRN
  Administered 2018-06-12: 100 mg via INTRAVENOUS

## 2018-06-12 MED ORDER — MIDAZOLAM HCL 2 MG/2ML IJ SOLN
INTRAMUSCULAR | Status: DC | PRN
  Administered 2018-06-12: 2 mg via INTRAVENOUS

## 2018-06-12 MED ORDER — PROPOFOL 10 MG/ML IV BOLUS
INTRAVENOUS | Status: DC | PRN
  Administered 2018-06-12: 200 mg via INTRAVENOUS

## 2018-06-12 MED ORDER — SODIUM CHLORIDE 0.9 % IV SOLN
INTRAVENOUS | Status: DC
  Administered 2018-06-12: 09:00:00 via INTRAVENOUS

## 2018-06-12 NOTE — Transfer of Care (Signed)
Immediate Anesthesia Transfer of Care Note  Patient: Casey Castillo  Procedure(s) Performed: MRI OF CHEST WITH AND WITHOUT CONTRAST (N/A )  Patient Location: PACU  Anesthesia Type:General  Level of Consciousness: awake, alert , oriented and patient cooperative  Airway & Oxygen Therapy: Patient Spontanous Breathing and Patient connected to face mask oxygen  Post-op Assessment: Report given to RN and Post -op Vital signs reviewed and stable  Post vital signs: Reviewed and stable  Last Vitals:  Vitals Value Taken Time  BP 123/82 06/12/2018  1:48 PM  Temp    Pulse    Resp 11 06/12/2018  1:49 PM  SpO2    Vitals shown include unvalidated device data.  Last Pain:  Vitals:   06/12/18 1349  TempSrc:   PainSc: (P) 0-No pain      Patients Stated Pain Goal: 2 (62/37/62 8315)  Complications: No apparent anesthesia complications

## 2018-06-12 NOTE — Anesthesia Procedure Notes (Signed)
Procedure Name: Intubation Date/Time: 06/12/2018 11:30 AM Performed by: Kathryne Hitch, CRNA Pre-anesthesia Checklist: Emergency Drugs available, Patient identified, Suction available, Timeout performed and Patient being monitored Patient Re-evaluated:Patient Re-evaluated prior to induction Oxygen Delivery Method: Circle system utilized Preoxygenation: Pre-oxygenation with 100% oxygen Induction Type: IV induction Ventilation: Mask ventilation without difficulty Laryngoscope Size: McGraph Grade View: Grade I Tube type: Oral Tube size: 7.5 mm Number of attempts: 1 Airway Equipment and Method: Stylet Placement Confirmation: ETT inserted through vocal cords under direct vision,  breath sounds checked- equal and bilateral and positive ETCO2 Secured at: 23 cm Tube secured with: Tape Dental Injury: Teeth and Oropharynx as per pre-operative assessment

## 2018-06-12 NOTE — Anesthesia Postprocedure Evaluation (Signed)
Anesthesia Post Note  Patient: Casey Castillo  Procedure(s) Performed: MRI OF CHEST WITH AND WITHOUT CONTRAST (N/A )     Patient location during evaluation: PACU Anesthesia Type: General Level of consciousness: awake and alert Pain management: pain level controlled Vital Signs Assessment: post-procedure vital signs reviewed and stable Respiratory status: spontaneous breathing, nonlabored ventilation and respiratory function stable Cardiovascular status: blood pressure returned to baseline and stable Postop Assessment: no apparent nausea or vomiting Anesthetic complications: no    Last Vitals:  Vitals:   06/12/18 1400 06/12/18 1407  BP:  120/83  Pulse: 81 75  Resp: 16 16  Temp:  36.5 C  SpO2: 95% 98%    Last Pain:  Vitals:   06/12/18 1349  TempSrc:   PainSc: 0-No pain                 Brennan Bailey

## 2018-06-13 ENCOUNTER — Encounter (HOSPITAL_COMMUNITY): Payer: Self-pay | Admitting: Radiology

## 2018-06-26 DIAGNOSIS — G540 Brachial plexus disorders: Secondary | ICD-10-CM | POA: Diagnosis not present

## 2018-06-26 DIAGNOSIS — R030 Elevated blood-pressure reading, without diagnosis of hypertension: Secondary | ICD-10-CM | POA: Diagnosis not present

## 2018-06-26 DIAGNOSIS — Z6831 Body mass index (BMI) 31.0-31.9, adult: Secondary | ICD-10-CM | POA: Diagnosis not present

## 2018-07-10 DIAGNOSIS — E6609 Other obesity due to excess calories: Secondary | ICD-10-CM | POA: Diagnosis not present

## 2018-07-10 DIAGNOSIS — N401 Enlarged prostate with lower urinary tract symptoms: Secondary | ICD-10-CM | POA: Diagnosis not present

## 2018-07-10 DIAGNOSIS — Z0001 Encounter for general adult medical examination with abnormal findings: Secondary | ICD-10-CM | POA: Diagnosis not present

## 2018-07-10 DIAGNOSIS — I10 Essential (primary) hypertension: Secondary | ICD-10-CM | POA: Diagnosis not present

## 2018-07-10 DIAGNOSIS — Z1389 Encounter for screening for other disorder: Secondary | ICD-10-CM | POA: Diagnosis not present

## 2018-07-10 DIAGNOSIS — Z6832 Body mass index (BMI) 32.0-32.9, adult: Secondary | ICD-10-CM | POA: Diagnosis not present

## 2018-07-15 DIAGNOSIS — F4312 Post-traumatic stress disorder, chronic: Secondary | ICD-10-CM | POA: Diagnosis not present

## 2018-08-12 DIAGNOSIS — S0502XA Injury of conjunctiva and corneal abrasion without foreign body, left eye, initial encounter: Secondary | ICD-10-CM | POA: Diagnosis not present

## 2018-08-27 DIAGNOSIS — F4312 Post-traumatic stress disorder, chronic: Secondary | ICD-10-CM | POA: Diagnosis not present

## 2018-10-01 DIAGNOSIS — F4312 Post-traumatic stress disorder, chronic: Secondary | ICD-10-CM | POA: Diagnosis not present

## 2018-10-06 IMAGING — MR MR CERVICAL SPINE W/O CM
4 of 5 series · 30 of 48 positions shown · non-contrast
Comparison: MRI 01/23/2017

CLINICAL DATA: Neck pain and bilateral arm pain and numbness.
History of cervical fusion in 8132.

EXAM:
MRI CERVICAL SPINE WITHOUT CONTRAST
TECHNIQUE: Multiplanar, multisequence MR imaging of the cervical spine was
performed. No intravenous contrast was administered.

[Series 2: (id) tse sag · sagittal · 3.0mm · 0.41mm/px · 5 of 13 slices shown]
[im 1/13]
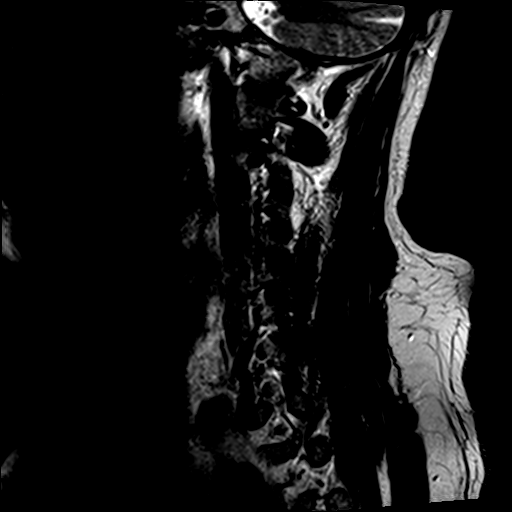
[im 3/13]
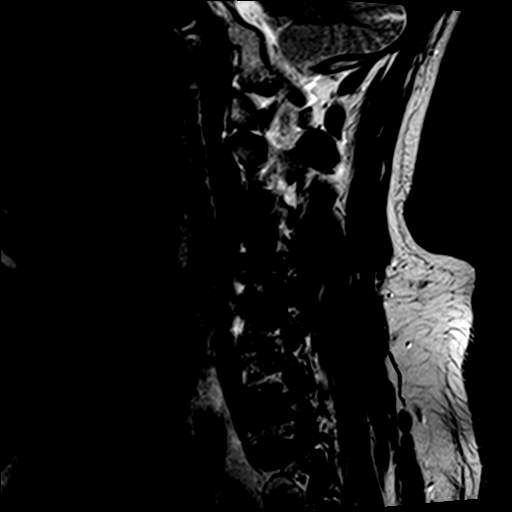
[im 5/13]
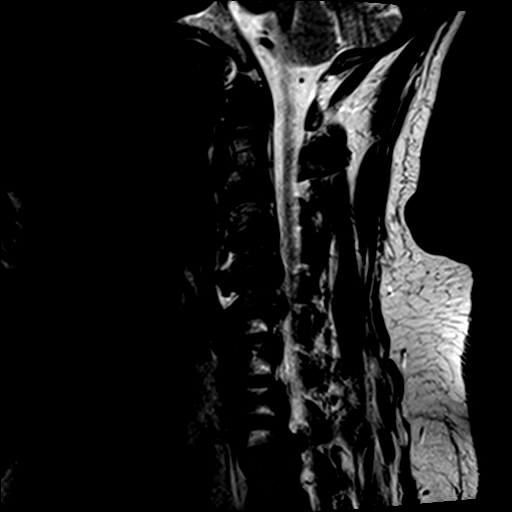
[im 8/13]
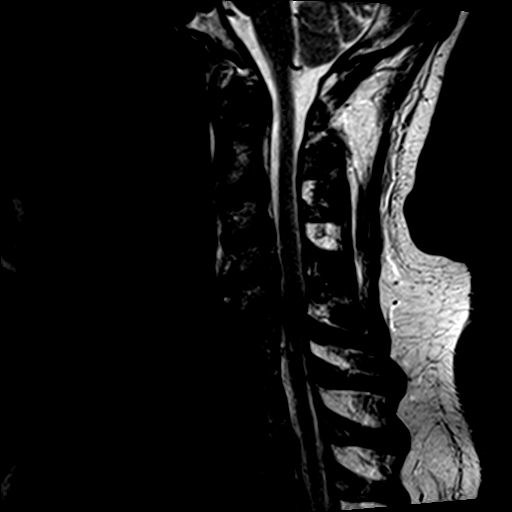
[im 13/13]
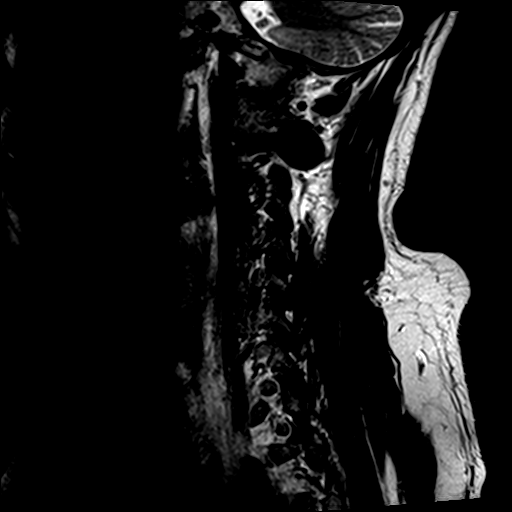

[Series 4: STIR · sagittal · 3.0mm · 0.82mm/px · 5 of 13 slices shown]
[im 1/13]
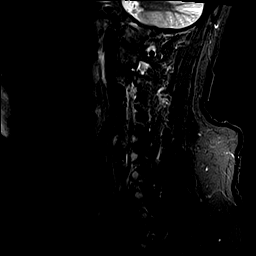
[im 4/13]
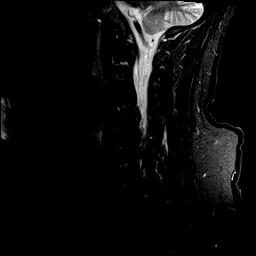
[im 7/13]
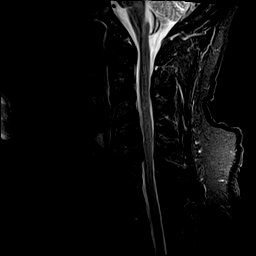
[im 10/13]
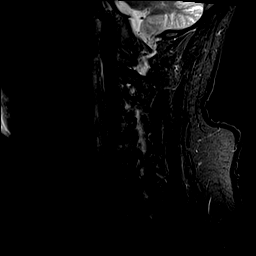
[im 13/13]
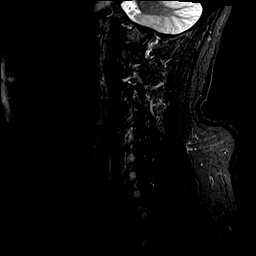

[Series 5: T2 · axial · 3.0mm · 0.39mm/px · z∈[-101,+18]mm · 10 of 38 slices shown (1 of 2)]
[im 3/38]
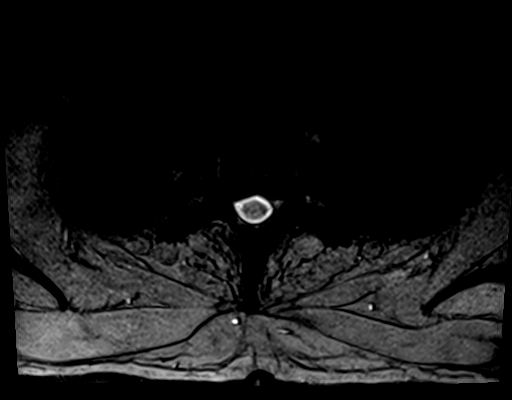
[im 5/38]
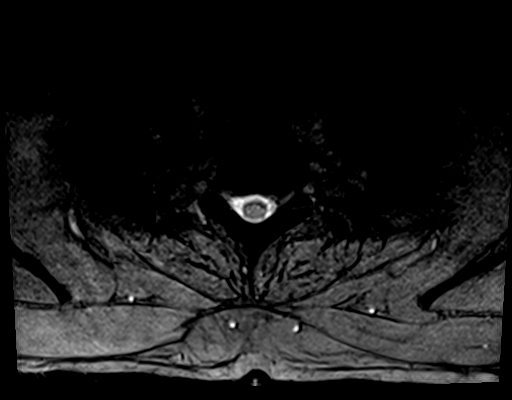
[im 8/38]
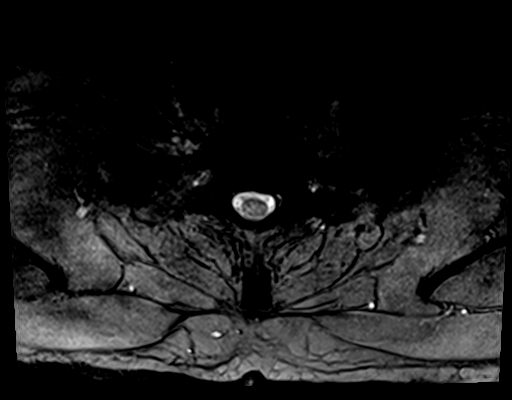
[im 13/38]
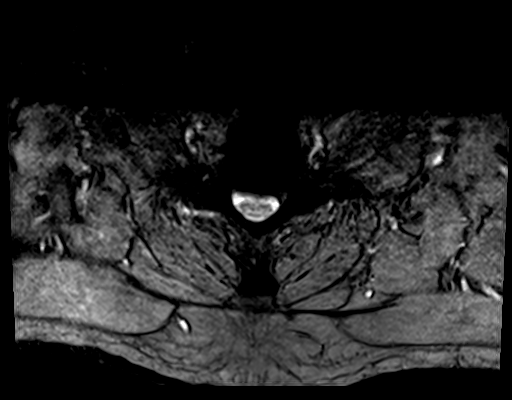
[im 18/38]
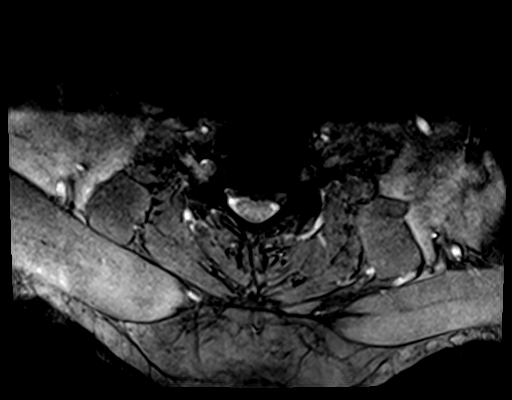
[im 20/38]
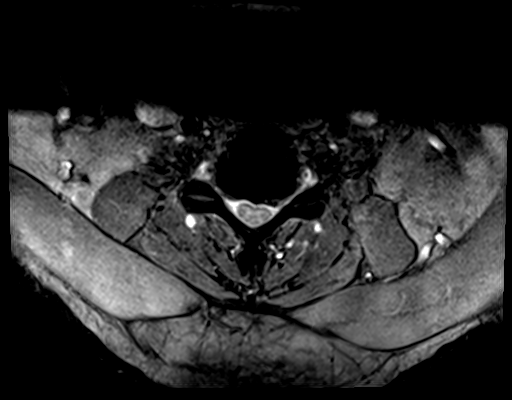
[im 23/38]
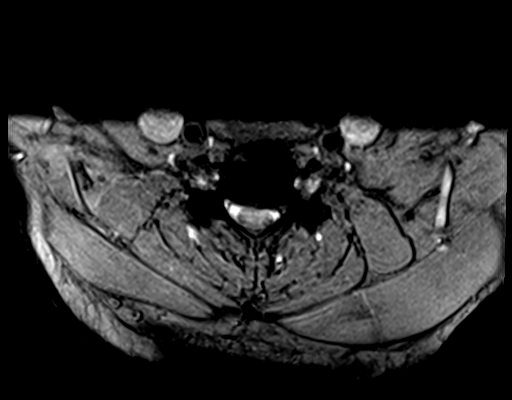
[im 28/38]
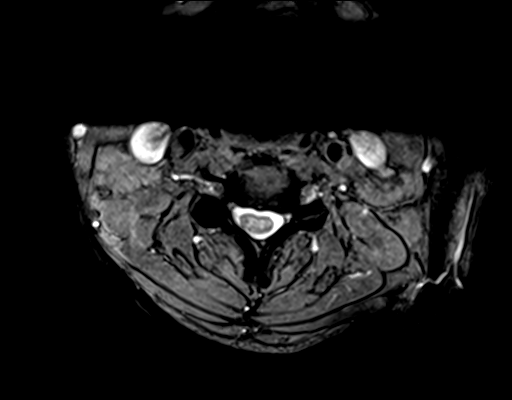
[im 33/38]
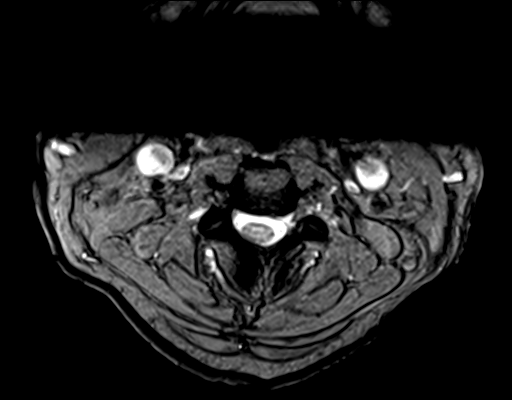
[im 38/38]
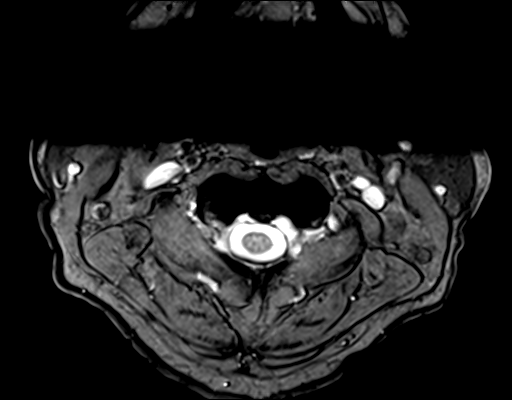

[Series 6: T2 · axial · 3.0mm · 0.62mm/px · z∈[-108,+11]mm · 10 of 38 slices shown (2 of 2)]
[im 3/38]
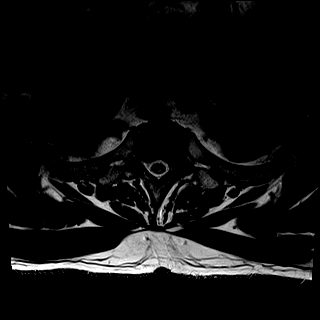
[im 5/38]
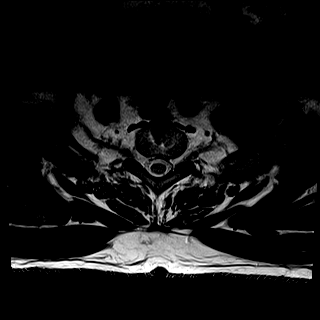
[im 8/38]
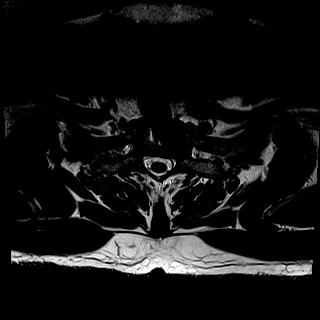
[im 13/38]
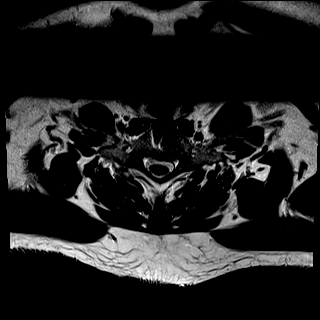
[im 18/38]
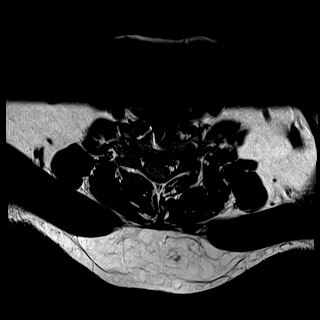
[im 20/38]
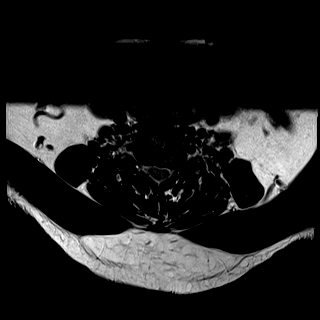
[im 23/38]
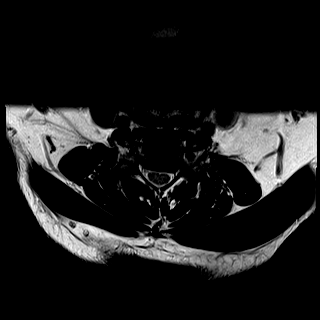
[im 28/38]
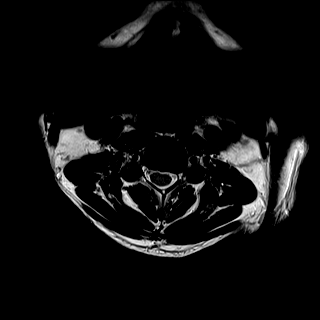
[im 33/38]
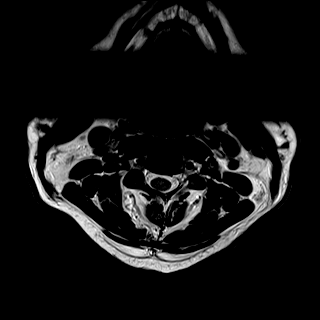
[im 38/38]
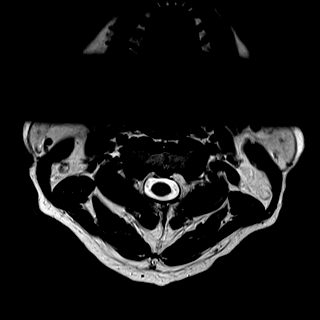

[30 of 48 positions shown; findings below may reference images not displayed]

FINDINGS: Alignment: Mild straightening of the normal cervical lordosis is a
stable finding. Mild degenerative cervical spondylosis with mild
degenerative posterior subluxation of C4 and C5 which also appears
stable.

Vertebrae: Normal marrow signal. Moderate artifact related to the
cervical fusion hardware from C6-T1.

Cord: Stable small focus of cord signal abnormality at the C6-7
level consistent with small focus of myelomalacia. No new lesions
are identified.

Posterior Fossa, vertebral arteries, paraspinal tissues: No
significant findings.

Disc levels:

C2-3: No significant findings.

C3-4: Stable mild to moderate left foraminal stenosis due to a
shallow disc osteophyte complex and mild facet disease. No spinal
stenosis.

C4-5: Moderate right and moderately severe left foraminal stenosis
due to disc osteophyte complexes appears stable. No significant
spinal stenosis.

C5-6: Broad-based bulging degenerated annulus, osteophytic ridging
and uncinate spurring. There is flattening of the ventral thecal sac
and obliteration of the ventral CSF space. This appears slightly
progressive. There is also severe bilateral foraminal stenosis, left
greater than right which appears stable.

C6-7: Anterior and interbody fusion changes. No spinal or foraminal
stenosis.

C7-T1: Anterior inj body fusion changes. No spinal or foraminal
stenosis.
IMPRESSION: 1. Stable surgical changes with anterior and interbody fusion at
C6-7 and C7-T1. No complicating features. No spinal or foraminal
stenosis.
2. Multifactorial spinal and bilateral foraminal stenosis at C5-6.
The spinal stenosis appears slightly progressive. The severe
bilateral foraminal stenosis, left greater than right appears
stable.
3. Stable mild to moderate left foraminal stenosis at C3-4.
4. Stable moderate right and moderately severe left foraminal
stenosis at C4-5.

## 2018-11-12 DIAGNOSIS — F4312 Post-traumatic stress disorder, chronic: Secondary | ICD-10-CM | POA: Diagnosis not present

## 2018-12-09 DIAGNOSIS — H524 Presbyopia: Secondary | ICD-10-CM | POA: Diagnosis not present

## 2018-12-17 DIAGNOSIS — F4312 Post-traumatic stress disorder, chronic: Secondary | ICD-10-CM | POA: Diagnosis not present

## 2018-12-23 ENCOUNTER — Telehealth: Payer: Self-pay | Admitting: *Deleted

## 2018-12-23 NOTE — Telephone Encounter (Signed)
A message was left, re: follow up visit. 

## 2018-12-24 NOTE — Telephone Encounter (Signed)
I called pt to confirm his appt with Almyra Deforest on 12-25-18.        COVID-19 Pre-Screening Questions:   In the past 7 to 10 days have you had a cough,  shortness of breath, headache, congestion, fever (100 or greater) body aches, chills, sore throat, or sudden loss of taste or sense of smell? no  Have you been around anyone with known Covid 19.  Have you been around anyone who is awaiting Covid 19 test results in the past 7 to 10 days? no  Have you been around anyone who has been exposed to Covid 19, or has mentioned symptoms of Covid 19 within the past 7 to 10 days? no  If you have any concerns/questions about symptoms patients report during screening (either on the phone or at threshold). Contact the provider seeing the patient or DOD for further guidance.  If neither are available contact a member of the leadership team.

## 2018-12-25 ENCOUNTER — Ambulatory Visit (INDEPENDENT_AMBULATORY_CARE_PROVIDER_SITE_OTHER): Payer: PPO | Admitting: Physician Assistant

## 2018-12-25 ENCOUNTER — Other Ambulatory Visit: Payer: Self-pay

## 2018-12-25 ENCOUNTER — Ambulatory Visit: Payer: PPO | Admitting: Cardiovascular Disease

## 2018-12-25 ENCOUNTER — Encounter: Payer: Self-pay | Admitting: Physician Assistant

## 2018-12-25 VITALS — BP 120/68 | HR 65 | Temp 97.3°F | Ht 70.0 in | Wt 218.0 lb

## 2018-12-25 DIAGNOSIS — E7800 Pure hypercholesterolemia, unspecified: Secondary | ICD-10-CM

## 2018-12-25 DIAGNOSIS — I2510 Atherosclerotic heart disease of native coronary artery without angina pectoris: Secondary | ICD-10-CM | POA: Diagnosis not present

## 2018-12-25 NOTE — Patient Instructions (Signed)
Medication Instructions:  Your physician recommends that you continue on your current medications as directed. Please refer to the Current Medication list given to you today.  If you need a refill on your cardiac medications before your next appointment, please call your pharmacy.   Lab work: Please have your PCP forward a copy of your next labs to our office: Fax number [phone] Attn: Almyra Deforest, PA-C If you have labs (blood work) drawn today and your tests are completely normal, you will receive your results only by: Marland Kitchen MyChart Message (if you have MyChart) OR . A paper copy in the mail If you have any lab test that is abnormal or we need to change your treatment, we will call you to review the results.  Testing/Procedures: NONE ordered at this time of appointment   Follow-Up: At [facility], you and your health needs are our priority.  As part of our continuing mission to provide you with exceptional heart care, we have created designated Provider Care Teams.  These Care Teams include your primary Cardiologist (physician) and Advanced Practice Providers (APPs -  Physician Assistants and Nurse Practitioners) who all work together to provide you with the care you need, when you need it. You will need a follow up appointment in 12 months (July 2021).  Please call our office 2 months in advance to schedule this appointment.  You may see Sanda Klein, MD or one of the following Advanced Practice Providers on your designated Care Team: [place], [place] . Fabian Sharp, PA-C  Any Other Special Instructions Will Be Listed Below (If Applicable).

## 2018-12-25 NOTE — Progress Notes (Signed)
Cardiology Office Note    Date:  12/27/2018   ID:  Casey Castillo, DOB 1946/01/26, MRN [id number]  PCP:  Redmond School, MD  Cardiologist:  Dr. Sallyanne Kuster  Chief Complaint  Patient presents with  . 1 year Follow-up    seen for Dr. Sallyanne Kuster    History of Present Illness:  Casey Castillo is a 73 y.o. male with PMH of remote DVT of lower extremity (off anticoagulation), coronary atherosclerosis without severe stenosis and hyperlipidemia.  He apparently had a remote coronary angiography years ago, however I do not have the report of this.  He had a previous treadmill stress test in during in March 2018 and was able to achieve 10 METS and a maximum heart rate of 107% of predicted for age. The result was deemed equivocal due to upsloping ST segment depression changes however on further review by Dr. Sallyanne Kuster, it was felt that this is a normal test.  He was last seen by Dr. Sallyanne Kuster on 12/23/2017 as part of a preoperative clearance prior to his surgery, he was cleared without further work-up.  He underwent anterior cervical decompression surgery by Dr. Ellene Route in July 2019.  Patient presents today for cardiology office visit.  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  He also presented to car shows as well.  Unfortunately since the neck surgery last year, he has not been able to approximate his left thumb with his second finger.  He says he likely suffered a nerve injury after IV placement that are too deep and injured his nerve in the arm.  Today's EKG does show possible Q waves in V1-V2 that was not present from previous EKG.  However patient denies ever having any chest pain.  He says he has been active since the surgery and has not noticed any exertional symptoms.  He does not have any lower extremity edema, orthopnea or PND.  Given lack of symptom, I did not recommend any further work-up.  He can follow-up with Dr. Sallyanne Kuster in 1 year.   He has not gotten repeat fasting lipid panel and LFTs this year, the next time he see his PCP he will need to forward [place] a copy of his lab work.   Past Medical History:  Diagnosis Date  . Arthritis   . Cervical myelopathy with cervical radiculopathy   . Claustrophobia   . Coronary artery disease    5%blockage on left anterior descending Dr. Sallyanne Kuster   . Diverticulitis    hx of  . Enlarged prostate    takes flomax  . GERD (gastroesophageal reflux disease)   . History of blood clots    november, 5 1995- left leg; 2014 - left calf  . Hyperlipidemia    primary Dr. Selena Batten  . Kidney stone    hx of sees Dr. Chauncey Cruel. [place]  . Pneumonia    as a child  . PTSD (post-traumatic stress disorder)   . PTSD (post-traumatic stress disorder)   . Seasonal allergies   . Urinary frequency    hx of     Past Surgical History:  Procedure Laterality Date  . ANTERIOR CERVICAL DECOMP/DISCECTOMY FUSION  01/28/2012   Procedure: ANTERIOR CERVICAL DECOMPRESSION/DISCECTOMY FUSION 2 LEVELS;  Surgeon: Kristeen Miss, MD;  Location: [place];  Service: Neurosurgery;  Laterality: N/A;  Cervical six-seven, cervical seven-thoracic one Anterior cervical decompression/diskectomy/fusion  . ANTERIOR CERVICAL DECOMP/DISCECTOMY FUSION N/A 12/24/2017  Procedure: ANTERIOR CERVICAL DECOMPRESSION/DISCECTOMY FUSION CERVICAL FOUR-FIVE/CERVICAL FIVE-SIX TWO LEVELS;  Surgeon: Kristeen Miss, MD;  Location: [facility];  Service: Neurosurgery;  Laterality: N/A;  ANTERIOR CERVICAL DECOMPRESSION/DISCECTOMY FUSION CERVICAL FOUR-FIVE/CERVICAL FIVE-SIX TWO LEVELS  . CARDIAC CATHETERIZATION     09-06-11   . CARDIOVASCULAR STRESS TEST     2006 and 2008 at [facility] heart [facility] vascular, 2011 myoview and echo  . COLONOSCOPY    . COLONOSCOPY N/A 08/30/2015   Procedure: COLONOSCOPY;  Surgeon: Aviva Signs, MD;  [facility];  Service: Gastroenterology;  Laterality: N/A;  . engrown  toenail Bilateral   . EYE SURGERY     Laser for  torn retina  . HEMORRHOID SURGERY     sept 8, 2008  . KIDNEY STONE SURGERY     lithotripsy 1980's  . KNEE SURGERY     left 04-11-94 ( has had two surgeries to left knee)  . NASAL SINUS SURGERY     Jun 30 1998  . NM MYOVIEW LTD  07/20/2009   no ischemia  . PILONIDAL CYST / SINUS EXCISION     surgery 01-08-74  . RADIOLOGY WITH ANESTHESIA N/A 06/12/2018   Procedure: MRI OF CHEST WITH AND WITHOUT CONTRAST;  Surgeon: Radiologist, Medication, MD;  Location: [facility];  Service: Radiology;  Laterality: N/A;  . SHOULDER SURGERY     right 12-20-2003  . TRIGGER FINGER RELEASE     03-21-98 right hand ring finger  . US ECHOCARDIOGRAPHY  07/20/2009   LA & RA mildly dilated,trace MR,TR.    Current Medications: Outpatient Medications Prior to Visit  Medication Sig Dispense Refill  . acetaminophen (TYLENOL) 650 MG CR tablet Take 1,300 mg by mouth daily as needed for pain.    Marland Kitchen aspirin EC 81 MG tablet Take 81 mg by mouth daily.    . Biotin 5000 MCG TABS Take 5,000 mcg by mouth at bedtime.    . calcium elemental as carbonate (TUMS ULTRA 1000) 400 MG chewable tablet Chew 2,000-3,000 mg by mouth daily as needed for heartburn.    . Cholecalciferol (VITAMIN D) 2000 UNITS CAPS Take 2,000 Units by mouth every evening.     . diphenhydrAMINE (BENADRYL) 50 MG tablet Take 50 mg by mouth at bedtime as needed for sleep.    Marland Kitchen Emollient (AQUAPHOR EX) Apply 1 application topically at bedtime as needed (severe dry skin.).    Marland Kitchen loratadine (CLARITIN) 10 MG tablet Take 10 mg by mouth daily.    Marland Kitchen Lysine HCl 500 MG TABS Take 500 mg by mouth at bedtime.     . Melatonin 5 MG TABS Take 5 mg by mouth at bedtime as needed (sleep).    . Omega-3 Fatty Acids (FISH OIL) 1200 MG CAPS Take 1,200 mg by mouth at bedtime.    Marland Kitchen oxybutynin (DITROPAN) 5 MG tablet Take 2.5 mg by mouth daily.     . pantoprazole (PROTONIX) 40 MG tablet Take 40 mg by mouth daily.   2  . simvastatin (ZOCOR) 40 MG tablet Take 1 tablet (40 mg total) by mouth daily. 90  tablet 3  . tadalafil (CIALIS) 5 MG tablet Take 5 mg by mouth every evening.     . Tamsulosin HCl (FLOMAX) 0.4 MG CAPS Take 0.4 mg by mouth 2 (two) times daily.     . calcium carbonate (OSCAL) 1500 (600 Ca) MG TABS tablet Take 600 mg of elemental calcium by mouth daily. Magnesium- 400 Calcium 1000 Zinc 15    . clobetasol ointment (TEMOVATE) 0.05 %  Apply 1 application topically 2 (two) times daily as needed (for hand dryness/irritation.).    Marland Kitchen naproxen sodium (ALEVE) 220 MG tablet Take 220 mg by mouth daily as needed (pain).     No facility-administered medications prior to visit.      Allergies:   Adhesive [tape]   Social History   Socioeconomic History  . Marital status: Widowed    Spouse name: Not on file  . Number of children: Not on file  . Years of education: Not on file  . Highest education level: Not on file  Occupational History  . Not on file  Social Needs  . Financial resource strain: Not on file  . Food insecurity    Worry: Not on file    Inability: Not on file  . Transportation needs    Medical: Not on file    Non-medical: Not on file  Tobacco Use  . Smoking status: Former Smoker    Packs/day: 1.00    Years: 20.00    Pack years: 20.00    Types: Cigarettes    Quit date: 1972    Years since quitting: 48.5  . Smokeless tobacco: Never Used  . Tobacco comment: quit smokin cigarettes at age 73  Substance and Sexual Activity  . Alcohol use: No  . Drug use: No  . Sexual activity: Not on file  Lifestyle  . Physical activity    Days per week: Not on file    Minutes per session: Not on file  . Stress: Not on file  Relationships  . Social Herbalist on phone: Not on file    Gets together: Not on file    Attends religious service: Not on file    Active member of club or organization: Not on file    Attends meetings of clubs or organizations: Not on file    Relationship status: Not on file  Other Topics Concern  . Not on file  Social History  Narrative  . Not on file     Family History:  The patient's family history includes Dementia in his mother; Heart attack in his father; Heart disease in an other family member.   ROS:   Please see the history of present illness.    ROS All other systems reviewed and are negative.   PHYSICAL EXAM:   VS:  BP 120/68   Pulse 65   Temp (!) 97.3 F (36.3 C)   Ht 5\' 10"  (1.778 m)   Wt 218 lb (98.9 kg)   SpO2 97%   BMI 31.28 kg/m    GEN: Well nourished, well developed, in no acute distress  HEENT: normal  Neck: no JVD, carotid bruits, or masses Cardiac: RRR; no murmurs, rubs, or gallops,no edema  Respiratory:  clear to auscultation bilaterally, normal work of breathing GI: soft, nontender, nondistended, + BS MS: no deformity or atrophy  Skin: warm and dry, no rash Neuro:  Alert and Oriented x 3, Strength and sensation are intact Psych: euthymic mood, full affect  Wt Readings from Last 3 Encounters:  12/25/18 218 lb (98.9 kg)  06/12/18 212 lb (96.2 kg)  12/24/17 218 lb (98.9 kg)      Studies/Labs Reviewed:   EKG:  EKG is ordered today.  The ekg ordered today demonstrates normal sinus rhythm with Q waves in V1 and V2.  Otherwise no significant ST-T wave changes  Recent Labs: 06/12/2018: BUN 13; Creatinine, Ser 1.10; Potassium 3.9; Sodium 141   Lipid Panel No  results found for: CHOL, TRIG, HDL, CHOLHDL, VLDL, LDLCALC, LDLDIRECT  Additional studies/ records that were reviewed today include:   Low risk stress test on 08/24/2016    ASSESSMENT:    1. Coronary artery disease involving native coronary artery of native heart without angina pectoris   2. Hypercholesterolemia      PLAN:  In order of problems listed above:  1. CAD: Low risk stress test in March 2018.  He denies any recent chest discomfort.  He is quite active and lives [place].  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  He does so without any exertional  chest discomfort or shortness of breath.  EKG today does show Q waves in V1-V2 that was not present on the previous EKG, however given lack of symptom, I did not push for ischemic work-up.  2. Hyperlipidemia: Continue Zocor 40 mg daily.  Will defer annual lipid panel to primary care provider, he will need to send [place] a copy of his next fasting lipid panel and LFT as well.    Medication Adjustments/Labs and Tests Ordered: Current medicines are reviewed at length with the patient today.  Concerns regarding medicines are outlined above.  Medication changes, Labs and Tests ordered today are listed in the Patient Instructions below. Patient Instructions  Medication Instructions:  Your physician recommends that you continue on your current medications as directed. Please refer to the Current Medication list given to you today.  If you need a refill on your cardiac medications before your next appointment, please call your pharmacy.   Lab work: Please have your PCP forward a copy of your next labs to our office: Fax number [phone] Attn: Almyra Deforest, PA-C If you have labs (blood work) drawn today and your tests are completely normal, you will receive your results only by: Marland Kitchen MyChart Message (if you have MyChart) OR . A paper copy in the mail If you have any lab test that is abnormal or we need to change your treatment, we will call you to review the results.  Testing/Procedures: NONE ordered at this time of appointment   Follow-Up: At [facility], you and your health needs are our priority.  As part of our continuing mission to provide you with exceptional heart care, we have created designated Provider Care Teams.  These Care Teams include your primary Cardiologist (physician) and Advanced Practice Providers (APPs -  Physician Assistants and Nurse Practitioners) who all work together to provide you with the care you need, when you need it. You will need a follow up appointment in 12 months  (July 2021).  Please call our office 2 months in advance to schedule this appointment.  You may see Sanda Klein, MD or one of the following Advanced Practice Providers on your designated Care Team: [place], [place] . Fabian Sharp, PA-C  Any Other Special Instructions Will Be Listed Below (If Applicable).       Hilbert Corrigan, [place]  12/27/2018 11:06 PM    [facility] [place], [place], [place]  [address] Phone: [phone]; Fax: [phone]

## 2018-12-27 ENCOUNTER — Encounter: Payer: Self-pay | Admitting: Physician Assistant

## 2019-02-05 DIAGNOSIS — F4312 Post-traumatic stress disorder, chronic: Secondary | ICD-10-CM | POA: Diagnosis not present

## 2019-02-09 ENCOUNTER — Other Ambulatory Visit: Payer: Self-pay

## 2019-02-09 MED ORDER — SIMVASTATIN 40 MG PO TABS: 40.0000 mg | ORAL_TABLET | Freq: Every day | ORAL | 3 refills | Status: DC

## 2019-03-09 DIAGNOSIS — R7309 Other abnormal glucose: Secondary | ICD-10-CM | POA: Diagnosis not present

## 2019-03-09 DIAGNOSIS — E7849 Other hyperlipidemia: Secondary | ICD-10-CM | POA: Diagnosis not present

## 2019-03-18 DIAGNOSIS — F4312 Post-traumatic stress disorder, chronic: Secondary | ICD-10-CM | POA: Diagnosis not present

## 2019-04-07 ENCOUNTER — Encounter: Payer: Self-pay | Admitting: *Deleted

## 2019-04-27 ENCOUNTER — Other Ambulatory Visit: Payer: Self-pay

## 2019-04-27 ENCOUNTER — Ambulatory Visit (INDEPENDENT_AMBULATORY_CARE_PROVIDER_SITE_OTHER): Payer: Self-pay | Admitting: *Deleted

## 2019-04-27 DIAGNOSIS — Z86010 Personal history of colonic polyps: Secondary | ICD-10-CM

## 2019-04-27 MED ORDER — PEG 3350-KCL-NA BICARB-NACL 420 G PO SOLR: 4000.0000 mL | Freq: Once | ORAL | 0 refills | Status: AC

## 2019-04-27 NOTE — Progress Notes (Signed)
Gastroenterology Pre-Procedure Review  Request Date: 04/27/2019 Requesting Physician: Rowan Blase, PA @ [facility], Last TCS 08/30/2015 done by Dr. Arnoldo Morale, tubular adenoma  PATIENT REVIEW QUESTIONS: The patient responded to the following health history questions as indicated:    1. Diabetes Melitis: no 2. Joint replacements in the past 12 months: no 3. Major health problems in the past 3 months: no 4. Has an artificial valve or MVP: no 5. Has a defibrillator: no 6. Has been advised in past to take antibiotics in advance of a procedure like teeth cleaning: no 7. Family history of colon cancer:  no 8. Alcohol Use: no 9. Illicit drug Use: no 10. History of sleep apnea:  no 11. History of coronary artery or other vascular stents placed within the last 12 months: no 12. History of any prior anesthesia complications: no 13. There is no height or weight on file to calculate BMI.ht: 5'10 wt: 215 lbs    MEDICATIONS & ALLERGIES:    Patient reports the following regarding taking any blood thinners:   Plavix? no Aspirin? yes Coumadin? no Brilinta? no Xarelto? no Eliquis? no Pradaxa? no Savaysa? no Effient? no  Patient confirms/reports the following medications:  Current Outpatient Medications  Medication Sig Dispense Refill  . acetaminophen (TYLENOL) 650 MG CR tablet Take 1,300 mg by mouth as needed for pain.     Marland Kitchen aspirin EC 81 MG tablet Take 81 mg by mouth daily.    . Biotin 5000 MCG TABS Take 5,000 mcg by mouth at bedtime.    . calcium elemental as carbonate (TUMS ULTRA 1000) 400 MG chewable tablet Chew 2,000-3,000 mg by mouth daily as needed for heartburn.    . Cholecalciferol (VITAMIN D) 2000 UNITS CAPS Take 2,000 Units by mouth every evening.     . diphenhydrAMINE (BENADRYL) 50 MG tablet Take 50 mg by mouth as needed for sleep.     Marland Kitchen Emollient (AQUAPHOR EX) Apply 1 application topically at bedtime as needed (severe dry skin.).    Marland Kitchen loratadine (CLARITIN) 10 MG tablet Take 10 mg by mouth  daily.    Marland Kitchen Lysine HCl 500 MG TABS Take 500 mg by mouth at bedtime.     . Melatonin 5 MG TABS Take 5 mg by mouth at bedtime as needed (sleep).    . Omega-3 Fatty Acids (FISH OIL) 1200 MG CAPS Take 1,200 mg by mouth at bedtime.    Marland Kitchen oxybutynin (DITROPAN) 5 MG tablet Take 2.5 mg by mouth daily.     . pantoprazole (PROTONIX) 40 MG tablet Take 40 mg by mouth 2 (two) times daily.   2  . simvastatin (ZOCOR) 40 MG tablet Take 1 tablet (40 mg total) by mouth daily. 90 tablet 3  . tadalafil (CIALIS) 5 MG tablet Take 5 mg by mouth every evening.     . Tamsulosin HCl (FLOMAX) 0.4 MG CAPS Take 0.4 mg by mouth 2 (two) times daily.     . valACYclovir (VALTREX) 1000 MG tablet Take 1,000 mg by mouth 2 (two) times daily. Takes 1 tablet daily for 3 days as needed for fever blisters.     No current facility-administered medications for this visit.     Patient confirms/reports the following allergies:  Allergies  Allergen Reactions  . Adhesive [Tape] Hives, Rash and Other (See Comments)    EKG electrodes cause whelps.    No orders of the defined types were placed in this encounter.   AUTHORIZATION INFORMATION Primary Insurance: [employer],  ID #: [id number] Pre-Cert /  Auth required: No, not required  SCHEDULE INFORMATION: Procedure has been scheduled as follows:  Date: 07/07/2019, Time: 1:30 Location: [facility] with Dr. Gala Romney  This Gastroenterology Pre-Precedure Review Form is being routed to the following provider(s): Aliene Altes, PA

## 2019-04-27 NOTE — Progress Notes (Signed)
Ok to schedule.

## 2019-04-27 NOTE — Patient Instructions (Addendum)
Patient Name:  Casey Castillo Date of procedure: 08/26/2019 Time to register at [facility] Stay: 8:00 am Provider: Dr. Gala Romney   Purchase: MIRALAX 238 gram bottle, 1 FLEET ENEMA, 1 box of DULCOLAX (All over the counter medications)    08/24/2019---- 2 Days prior to procedure: START CLEAR LIQUID DIET AFTER YOUR LUNCH MEAL--NO SOLID FOODS!   08/25/2019---- 1 Day prior to procedure:   CLEAR LIQUIDS ALL DAY--NO SOLID FOODS!   Diabetic medication adjustments for today:    At 10:00 AM, take 2 DULCOLAX 2m tablets   At 12:00 PM, Mix 5 teaspoons of Miralax in any 4-6 ounces of CLEAR LIQUIDS (Gatorade) every hour for 5 hours until passing clear, watery stools. Be sure to drink 4 ounces of clear liquid 30 minutes after each dose of Miralax.   At 3:00 PM, take 2 Dulcolax 56mtablets   If stools are not clear & watery by 6:00 PM, take 5 teaspoons of Miralax every 30 minutes until stools are clear (no color)   You must have a complete prep to ensure the most effective cleaning.   CONTINUE CLEAR LIQUIDS ONLY UNTIL MIDNIGHT. Make a conscious effort to drink as much as you can before, during & after the preparation.    NOTHING TO EAT OR DRINK AFTER MIDNIGHT except for your heart, blood pressure & breathing medications. You may take them with a sip of clear liquids.    08/26/2019---- Day of Procedure  Give yourself one Fleet enema about 1 hour prior to leaving for the hospital.   Diabetic medication adjustments for today: n/a  You may take TYLENOL products. Please continue your regular medications unless we have instructed you otherwise.    Please note, on the day of your procedure you MUST be accompanied by an adult who is willing to assume responsibility for you at time of discharge. If you do not have such person with you, your procedure will have to be rescheduled.                                                              Please leave ALL jewelry at home prior to coming to the hospital for your procedure.   *It is your responsibility to check with your insurance company for the benefits of coverage you have for this procedure. Unfortunately, not all insurance companies have benefits to cover all or part of these types of procedures. It is your responsibility to check your benefits, however we will be glad to assist you with any codes your insurance company may need.   Please note that most insurance companies will not cover a screening colonoscopy for people under the age of 5073For example, with some insurance companies you may have benefits for a screening colonoscopy, but if polyps are found the diagnosis will change and then you may have a deductible that will need to be met. Please make sure you check your benefits for screening colonoscopy as well as a diagnostic colonoscopy.    CLEAR LIQUIDS: (NO RED)  Jello Apple Juice White Grape Juice Water  Banana popsicles Kool-Aid Coffee(No cream or milk)  Tea (No cream or milk) Soft drinks Broth (fat free beef/chicken/vegetable)   Clear liquids allow you to see your fingers on the other side of the glass. Be  sure they are NOT RED in color, cloudy, but CLEAR.   Do Not Eat:  Dairy products of any kind Cranberry juice  Tomato or V8 Juice Orange Juice  Grapefruit Juice Red Grape Juice  Solid foods like cereal, oatmeal, yogurt, fruits, vegetables, creamed soups, eggs, bread, etc   HELPFUL HINTS TO MAKE DRINKING EASIER:  -Make sure prep is extremely COLD. Refrigerate the night before. You may also put in freezer.  -You may try mixing Crystal Light or Country Time Lemonade if you prefer. MIx in small amounts. Add more if necessary.  -Trying drinking through a straw.  -Rinse mouth with water or mouthwash between glasses to remove aftertaste.  -Try sipping on a cold beverage/ice popsicles between glasses of prep.  -Place a piece of sugar-free hard candy in  mouth between glasses.  -If you become nauseated, try consuming smaller amounts or stretch out the time between glasses. Stop for 30 minutes to an hour & slowly start back drinking.   Call our office with any questions or concerns at [phone].   Thank You,   Christ Kick, [place]

## 2019-04-28 NOTE — Addendum Note (Signed)
Addended by: Metro Kung on: 04/28/2019 07:39 AM   Modules accepted: Orders, SmartSet

## 2019-04-29 DIAGNOSIS — F4312 Post-traumatic stress disorder, chronic: Secondary | ICD-10-CM | POA: Diagnosis not present

## 2019-05-06 ENCOUNTER — Encounter: Payer: Self-pay | Admitting: Gastroenterology

## 2019-05-06 ENCOUNTER — Other Ambulatory Visit: Payer: Self-pay

## 2019-05-06 ENCOUNTER — Ambulatory Visit: Payer: PPO | Admitting: Gastroenterology

## 2019-05-06 DIAGNOSIS — K642 Third degree hemorrhoids: Secondary | ICD-10-CM | POA: Diagnosis not present

## 2019-05-06 DIAGNOSIS — K649 Unspecified hemorrhoids: Secondary | ICD-10-CM | POA: Insufficient documentation

## 2019-05-06 DIAGNOSIS — Z86010 Personal history of colonic polyps: Secondary | ICD-10-CM | POA: Insufficient documentation

## 2019-05-06 DIAGNOSIS — K648 Other hemorrhoids: Secondary | ICD-10-CM | POA: Insufficient documentation

## 2019-05-06 MED ORDER — HYDROCORTISONE (PERIANAL) 2.5 % EX CREA: 1.0000 "application " | TOPICAL_CREAM | Freq: Two times a day (BID) | CUTANEOUS | 1 refills | Status: DC

## 2019-05-06 NOTE — Progress Notes (Signed)
Primary Care Physician:  Redmond School, MD Primary Gastroenterologist:  Dr. Gala Romney  Chief Complaint  Patient presents with   Hemorrhoids    itching, burning, no bleeding but feels raw    HPI:   Casey Castillo is a 73 y.o. male presenting today due to symptomatic hemorrhoids. He has already been triaged for a colonoscopy, which is upcoming Jan 2021. Personal history of polyps on several prior colonoscopies, done in [place] and most recently in 2017 by Dr. Arnoldo Morale: one 20 mm polyp in proximal ascending colon that was removed piecemeal fashion. 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   Feels like hemorrhoids have been exacerbated for about a month. Feels something on the right side of rectum at times. Spot of blood rarely. Using Tucks pads, preparation H. Has to use baby wipes. Has itching and burning. No pain with BM. Prolonged toilet time. No straining. BMs first thing each morning. Sometimes twice a day.    Past Medical History:  Diagnosis Date   Arthritis    Cervical myelopathy with cervical radiculopathy    Claustrophobia    Coronary artery disease    5%blockage on left anterior descending Dr. Sallyanne Kuster    Diverticulitis    hx of   Enlarged prostate    takes flomax   GERD (gastroesophageal reflux disease)    History of blood clots    november, 5 1995- left leg; 2014 - left calf   Hyperlipidemia    primary Dr. Selena Batten   Kidney stone    hx of sees Dr. Chauncey Cruel. [place] [facility]   Pneumonia    as a child   PTSD (post-traumatic stress disorder)    PTSD (post-traumatic stress disorder)    Seasonal allergies    Urinary frequency    hx of     Past Surgical History:  Procedure Laterality Date   ANTERIOR CERVICAL DECOMP/DISCECTOMY FUSION  01/28/2012   Procedure: ANTERIOR CERVICAL DECOMPRESSION/DISCECTOMY FUSION 2 LEVELS;  Surgeon: Kristeen Miss, MD;  Location: [facility];  Service:  Neurosurgery;  Laterality: N/A;  Cervical six-seven, cervical seven-thoracic one Anterior cervical decompression/diskectomy/fusion   ANTERIOR CERVICAL DECOMP/DISCECTOMY FUSION N/A 12/24/2017   Procedure: ANTERIOR CERVICAL DECOMPRESSION/DISCECTOMY FUSION CERVICAL FOUR-FIVE/CERVICAL FIVE-SIX TWO LEVELS;  Surgeon: Kristeen Miss, MD;  Location: [facility];  Service: Neurosurgery;  Laterality: N/A;  ANTERIOR CERVICAL DECOMPRESSION/DISCECTOMY FUSION CERVICAL FOUR-FIVE/CERVICAL FIVE-SIX TWO LEVELS   CARDIAC CATHETERIZATION     09-06-11    CARDIOVASCULAR STRESS TEST     2006 and 2008 at [facility] heart [facility] vascular, 2011 myoview and echo   COLONOSCOPY     COLONOSCOPY N/A 08/30/2015   Dr. Arnoldo Morale: one 20 mm polyp in proximal ascending colon, removed piecemeal using hot snare. Resected and retrieved. Diverticulosis in sigmoid colon. Path with tubular adenoma, no high grade dysplasia. 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 edge   engrown  toenail Bilateral    EYE SURGERY     Laser for torn retina   HEMORRHOID SURGERY     sept 8, 2008   [place]     lithotripsy 1980's   KNEE SURGERY     left 04-11-94 ( has had two surgeries to left knee)   NASAL SINUS SURGERY     Jun 30 1998   [facility] MYOVIEW LTD  07/20/2009   no ischemia   PILONIDAL CYST / SINUS EXCISION     surgery 01-08-74   RADIOLOGY WITH ANESTHESIA N/A  06/12/2018   Procedure: MRI OF CHEST WITH AND WITHOUT CONTRAST;  Surgeon: Radiologist, Medication, MD;  Location: [facility];  Service: Radiology;  Laterality: N/A;   SHOULDER SURGERY     right 12-20-2003   TRIGGER FINGER RELEASE     03-21-98 right hand ring finger   US ECHOCARDIOGRAPHY  07/20/2009   LA & RA mildly dilated,trace MR,TR.    Current Outpatient Medications  Medication Sig Dispense Refill   acetaminophen (TYLENOL) 650 MG CR tablet Take 1,300 mg by mouth as needed for pain.      aspirin EC 81 MG tablet Take 81 mg by mouth daily.     Biotin 5000  MCG TABS Take 5,000 mcg by mouth at bedtime.     calcium elemental as carbonate (TUMS ULTRA 1000) 400 MG chewable tablet Chew 2,000-3,000 mg by mouth daily as needed for heartburn.     Cholecalciferol (VITAMIN D) 2000 UNITS CAPS Take 2,000 Units by mouth every evening.      diphenhydrAMINE (BENADRYL) 50 MG tablet Take 50 mg by mouth as needed for sleep.      Emollient (AQUAPHOR EX) Apply 1 application topically at bedtime as needed (severe dry skin.).     loratadine (CLARITIN) 10 MG tablet Take 10 mg by mouth daily.     Lysine HCl 500 MG TABS Take 500 mg by mouth at bedtime.      Melatonin 5 MG TABS Take 5 mg by mouth at bedtime as needed (sleep).     Omega-3 Fatty Acids (FISH OIL) 1200 MG CAPS Take 1,200 mg by mouth at bedtime.     oxybutynin (DITROPAN) 5 MG tablet Take 2.5 mg by mouth daily.      pantoprazole (PROTONIX) 40 MG tablet Take 40 mg by mouth 2 (two) times daily.   2   simvastatin (ZOCOR) 40 MG tablet Take 1 tablet (40 mg total) by mouth daily. 90 tablet 3   tadalafil (CIALIS) 5 MG tablet Take 5 mg by mouth every evening.      Tamsulosin HCl (FLOMAX) 0.4 MG CAPS Take 0.4 mg by mouth 2 (two) times daily.      valACYclovir (VALTREX) 1000 MG tablet Take 1,000 mg by mouth 2 (two) times daily. Takes 1 tablet daily for 3 days as needed for fever blisters.     hydrocortisone (ANUSOL-HC) 2.5 % rectal cream Place 1 application rectally 2 (two) times daily. 30 g 1   No current facility-administered medications for this visit.     Allergies as of 05/06/2019 - Review Complete 05/06/2019  Allergen Reaction Noted   Adhesive [tape] Hives, Rash, and Other (See Comments) 05/26/2013    Family History  Problem Relation Age of Onset   Heart attack Father    Dementia Mother    Heart disease Other    Colon cancer Neg Hx     Social History   Socioeconomic History   Marital status: Widowed    Spouse name: Not on file   Number of children: Not on file   Years of  education: Not on file   Highest education level: Not on file  Occupational History   Not on file  Social Needs   Financial resource strain: Not on file   Food insecurity    Worry: Not on file    Inability: Not on file   Transportation needs    Medical: Not on file    Non-medical: Not on file  Tobacco Use   Smoking status: Former Smoker  Packs/day: 1.00    Years: 20.00    Pack years: 20.00    Types: Cigarettes    Quit date: 60    Years since quitting: 48.9   Smokeless tobacco: Never Used   Tobacco comment: quit smokin cigarettes at age 30  Substance and Sexual Activity   Alcohol use: No   Drug use: No   Sexual activity: Not on file  Lifestyle   Physical activity    Days per week: Not on file    Minutes per session: Not on file   Stress: Not on file  Relationships   Social connections    Talks on phone: Not on file    Gets together: Not on file    Attends religious service: Not on file    Active member of club or organization: Not on file    Attends meetings of clubs or organizations: Not on file    Relationship status: Not on file   Intimate partner violence    Fear of current or ex partner: Not on file    Emotionally abused: Not on file    Physically abused: Not on file    Forced sexual activity: Not on file  Other Topics Concern   Not on file  Social History Narrative   Not on file    Review of Systems: Gen: Denies any fever, chills, fatigue, weight loss, lack of appetite.  CV: Denies chest pain, heart palpitations, peripheral edema, syncope.  Resp: Denies shortness of breath at rest or with exertion. Denies wheezing or cough.  GI: see HPI GU : Denies urinary burning, urinary frequency, urinary hesitancy MS: Denies joint pain, muscle weakness, cramps, or limitation of movement.  Derm: Denies rash, itching, dry skin Psych: Denies depression, anxiety, memory loss, and confusion Heme: Denies bruising, bleeding, and enlarged lymph  nodes.  Physical Exam: BP (!) 144/72    Pulse 83    Temp (!) 97.1 F (36.2 C) (Oral)    Ht 5\' 10"  (1.778 m)    Wt 226 lb (102.5 kg)    BMI 32.43 kg/m  General:   Alert and oriented. Pleasant and cooperative. Well-nourished and well-developed.  Head:  Normocephalic and atraumatic. Eyes:  Without icterus, sclera clear and conjunctiva pink.  Ears:  Normal auditory acuity. Lungs:  Clear to auscultation bilaterally. No wheezes, rales, or rhonchi. No distress.  Heart:  S1, S2 present without murmurs appreciated.  Abdomen:  +BS, soft, non-tender and non-distended. No HSM noted. No guarding or rebound. Small umbilical hernia.  Rectal:  No external abnormalities. No fissure. Query internal hemorrhoids  Msk:  Symmetrical without gross deformities. Normal posture. Extremities:  Without edema. Neurologic:  Alert and  oriented x4 Psych:  Alert and cooperative. Normal mood and affect.  ASSESSMENT: Casey Castillo is a 73 y.o. male presenting today with symptomatic hemorrhoids that by description are likely Grade 3; he is already scheduled for surveillance colonoscopy, with personal history of polyps and a large polyp removed piecemeal in 2017 by Dr. Arnoldo Morale. Discussed supportive measures and behavior modification. Rectal exam without concerning findings. Will provide supportive care. If symptoms worsen in interim, could consider banding while awaiting colonoscopy later in Jan 2021.    PLAN: Anusol BID per rectum Avoid prolonged toilet time, no straining, continue to avoid constipation Proceed with TCS with Dr. Gala Romney in near future for surveillance purposes: the risks, benefits, and alternatives have been discussed with the patient in detail. The patient states understanding and desires to proceed. Return in Feb 2021  for likely banding.  Annitta Needs, PhD, ANP-BC [facility] Gastroenter[facility]

## 2019-05-06 NOTE — Patient Instructions (Addendum)
I have sent a hemorrhoid cream to your local pharmacy. Use this twice a day per rectum for 7 days, and you can repeat if needed.  If symptoms do not improve, please call me. We can do hemorrhoid banding before the colonoscopy if it becomes aggravating; otherwise, we will see you in February 2021 to likely do hemorrhoid banding.  Keep upcoming appointment for colonoscopy.  Please follow the instructions for helping to avoid hemorrhoid exacerbations. It is important to do this so they don't worsen.  It was a pleasure to see you today. I want to create trusting relationships with patients to provide genuine, compassionate, and quality care. I value your feedback. If you receive a survey regarding your visit,  I greatly appreciate you taking time to fill this out.   Annitta Needs, PhD, ANP-BC [facility]astroenter[facility]    Hemorrhoids Hemorrhoids are swollen veins in and around the rectum or anus. There are two types of hemorrhoids:  Internal hemorrhoids. These occur in the veins that are just inside the rectum. They may poke through to the outside and become irritated and painful.  External hemorrhoids. These occur in the veins that are outside the anus and can be felt as a painful swelling or hard lump near the anus. Most hemorrhoids do not cause serious problems, and they can be managed with home treatments such as diet and lifestyle changes. If home treatments do not help the symptoms, procedures can be done to shrink or remove the hemorrhoids. What are the causes? This condition is caused by increased pressure in the anal area. This pressure may result from various things, including:  Constipation.  Straining to have a bowel movement.  Diarrhea.  Pregnancy.  Obesity.  Sitting for long periods of time.  Heavy lifting or other activity that causes you to strain.  Anal sex.  Riding a bike for a long period of time. What are the signs or symptoms? Symptoms of this condition  include:  Pain.  Anal itching or irritation.  Rectal bleeding.  Leakage of stool (feces).  Anal swelling.  One or more lumps around the anus. How is this diagnosed? This condition can often be diagnosed through a visual exam. Other exams or tests may also be done, such as:  An exam that involves feeling the rectal area with a gloved hand (digital rectal exam).  An exam of the anal canal that is done using a small tube (anoscope).  A blood test, if you have lost a significant amount of blood.  A test to look inside the colon using a flexible tube with a camera on the end (sigmoidoscopy or colonoscopy). How is this treated? This condition can usually be treated at home. However, various procedures may be done if dietary changes, lifestyle changes, and other home treatments do not help your symptoms. These procedures can help make the hemorrhoids smaller or remove them completely. Some of these procedures involve surgery, and others do not. Common procedures include:  Rubber band ligation. Rubber bands are placed at the base of the hemorrhoids to cut off their blood supply.  Sclerotherapy. Medicine is injected into the hemorrhoids to shrink them.  Infrared coagulation. A type of light energy is used to get rid of the hemorrhoids.  Hemorrhoidectomy surgery. The hemorrhoids are surgically removed, and the veins that supply them are tied off.  Stapled hemorrhoidopexy surgery. The surgeon staples the base of the hemorrhoid to the rectal wall. Follow these instructions at home: Eating and drinking   Eat  foods that have a lot of fiber in them, such as whole grains, beans, nuts, fruits, and vegetables.  Ask your health care provider about taking products that have added fiber (fiber supplements).  Reduce the amount of fat in your diet. You can do this by eating low-fat dairy products, eating less red meat, and avoiding processed foods.  Drink enough fluid to keep your urine pale  yellow. Managing pain and swelling   Take warm sitz baths for 20 minutes, 3-4 times a day to ease pain and discomfort. You may do this in a bathtub or using a portable sitz bath that fits over the toilet.  If directed, apply ice to the affected area. Using ice packs between sitz baths may be helpful. ? Put ice in a plastic bag. ? Place a towel between your skin and the bag. ? Leave the ice on for 20 minutes, 2-3 times a day. General instructions  Take over-the-counter and prescription medicines only as told by your health care provider.  Use medicated creams or suppositories as told.  Get regular exercise. Ask your health care provider how much and what kind of exercise is best for you. In general, you should do moderate exercise for at least 30 minutes on most days of the week (150 minutes each week). This can include activities such as walking, biking, or yoga.  Go to the bathroom when you have the urge to have a bowel movement. Do not wait.  Avoid straining to have bowel movements.  Keep the anal area dry and clean. Use wet toilet paper or moist towelettes after a bowel movement.  Do not sit on the toilet for long periods of time. This increases blood pooling and pain.  Keep all follow-up visits as told by your health care provider. This is important. Contact a health care provider if you have:  Increasing pain and swelling that are not controlled by treatment or medicine.  Difficulty having a bowel movement, or you are unable to have a bowel movement.  Pain or inflammation outside the area of the hemorrhoids. Get help right away if you have:  Uncontrolled bleeding from your rectum. Summary  Hemorrhoids are swollen veins in and around the rectum or anus.  Most hemorrhoids can be managed with home treatments such as diet and lifestyle changes.  Taking warm sitz baths can help ease pain and discomfort.  In severe cases, procedures or surgery can be done to shrink or  remove the hemorrhoids. This information is not intended to replace advice given to you by your health care provider. Make sure you discuss any questions you have with your health care provider. Document Released: 05/25/2000 Document Revised: 06/05/2018 Document Reviewed: 10/17/2017 Elsevier Patient Education  2020 [employer].

## 2019-05-11 ENCOUNTER — Encounter: Payer: Self-pay | Admitting: Gastroenterology

## 2019-05-19 ENCOUNTER — Ambulatory Visit (INDEPENDENT_AMBULATORY_CARE_PROVIDER_SITE_OTHER): Payer: PPO | Admitting: Urology

## 2019-05-19 DIAGNOSIS — R3915 Urgency of urination: Secondary | ICD-10-CM

## 2019-05-19 DIAGNOSIS — N5201 Erectile dysfunction due to arterial insufficiency: Secondary | ICD-10-CM

## 2019-05-19 DIAGNOSIS — N401 Enlarged prostate with lower urinary tract symptoms: Secondary | ICD-10-CM | POA: Diagnosis not present

## 2019-06-11 ENCOUNTER — Other Ambulatory Visit: Payer: Self-pay

## 2019-06-15 DIAGNOSIS — F4312 Post-traumatic stress disorder, chronic: Secondary | ICD-10-CM | POA: Diagnosis not present

## 2019-06-15 DIAGNOSIS — L308 Other specified dermatitis: Secondary | ICD-10-CM | POA: Diagnosis not present

## 2019-06-15 DIAGNOSIS — L821 Other seborrheic keratosis: Secondary | ICD-10-CM | POA: Diagnosis not present

## 2019-06-18 ENCOUNTER — Telehealth: Payer: Self-pay | Admitting: Internal Medicine

## 2019-06-18 NOTE — Telephone Encounter (Signed)
Called pt back and informed him that he could do a Miralax prep.  Pt will come by today to get new prep instructions and to go over them with me.

## 2019-06-18 NOTE — Telephone Encounter (Signed)
Pt is scheduled colonoscopy with [facility] on 1/26 and his prep is too expensive and the generic is on back order. He uses [facility] in [place], Please advise if there's anything else we could call in. [phone] or [phone]

## 2019-06-22 ENCOUNTER — Telehealth: Payer: Self-pay | Admitting: *Deleted

## 2019-06-22 NOTE — Telephone Encounter (Signed)
Pt had a procedure scheduled for 07/07/2019 and is aware that it must be cancelled due to Covid surge.  He is aware that we will call him once we get clearance to start rescheduling procedures.  He has a hemorrhoid banding scheduled for 07/29/2019.  Does this need to be rescheduled?

## 2019-06-22 NOTE — Telephone Encounter (Signed)
He can keep this appt for banding. Just need to ensure he definitely has a colonoscopy once able this year.

## 2019-06-22 NOTE — Telephone Encounter (Signed)
Called pt and informed him that we would need to cancel his Covid screening and procedure due to Covid surge.  Pt is aware that we will call him back once we are cleared to reschedule procedures.  Pt voiced understanding.  Endo notified.

## 2019-06-22 NOTE — Telephone Encounter (Signed)
Called pt and informed him that he could keep his hemorrhoid banding appt.  Pt requested to move his appt up since he isn't having his colonoscopy right now.  Rescheduled hemorrhoid banding to 07/03/2019 at 10:00.

## 2019-07-03 ENCOUNTER — Other Ambulatory Visit (HOSPITAL_COMMUNITY): Payer: PPO

## 2019-07-03 ENCOUNTER — Other Ambulatory Visit: Payer: Self-pay

## 2019-07-03 ENCOUNTER — Encounter: Payer: Self-pay | Admitting: Gastroenterology

## 2019-07-03 ENCOUNTER — Ambulatory Visit (INDEPENDENT_AMBULATORY_CARE_PROVIDER_SITE_OTHER): Payer: PPO | Admitting: Gastroenterology

## 2019-07-03 VITALS — BP 150/82 | HR 68 | Temp 97.3°F | Ht 70.0 in | Wt 226.2 lb

## 2019-07-03 DIAGNOSIS — K641 Second degree hemorrhoids: Secondary | ICD-10-CM

## 2019-07-03 NOTE — Progress Notes (Signed)
[facility] Banding Note:   Pleasant 74 year old male presenting with Grade 2-3 symptomatic hemorrhoids. History of polyps with large polyp removed via piecemeal fashion in 2017 by Dr. Arnoldo Morale. Due to COVID restrictions, this is on hold until a more elective setting. He desires to have hemorrhoid banding in the interim due to bothersome symptoms and is fully aware of need for colonoscopy later this year.    The patient presents with symptomatic grade 2-3 hemorrhoids, unresponsive to maximal medical therapy, requesting rubber band ligation of his hemorrhoidal disease. Continues with paper hematochezia, itching, burning. No pain. No anticoagulation. All risks, benefits, and alternative forms of therapy were described and informed consent was obtained.  In the left lateral decubitus position  anoscopic examination revealed grade 2 hemorrhoids most predominantly in the right anterior and left lateral position (s).  The decision was made to band the right anterior internal hemorrhoid, and the [place] was used to perform band ligation without complication. Digital anorectal examination was then performed to assure proper positioning of the band, and no adjustment of the banded tissue was required. The patient was discharged home without pain or other issues. Dietary and behavioral recommendations were given and (if necessary prescriptions were given), along with follow-up instructions. The patient will return in 2-3 weeks for followup and possible additional banding as required. Will focus on left lateral at next appointment.   No complications were encountered and the patient tolerated the procedure well.  Annitta Needs, PhD, ANP-BC [facility]

## 2019-07-03 NOTE — Patient Instructions (Signed)
Avoid straining. Limit toilet time to 2-3 minutes.   Continue to avoid constipation.  We will see you in 2-3 weeks for repeat banding!  I enjoyed seeing you again today! As you know, I value our relationship and want to provide genuine, compassionate, and quality care. I welcome your feedback. If you receive a survey regarding your visit,  I greatly appreciate you taking time to fill this out. See you next time!  Annitta Needs, PhD, ANP-BC [facility]

## 2019-07-07 ENCOUNTER — Encounter (HOSPITAL_COMMUNITY): Payer: Self-pay

## 2019-07-07 ENCOUNTER — Ambulatory Visit (HOSPITAL_COMMUNITY): Admit: 2019-07-07 | Payer: PPO | Admitting: Internal Medicine

## 2019-07-07 SURGERY — COLONOSCOPY
Anesthesia: Moderate Sedation

## 2019-07-09 ENCOUNTER — Telehealth: Payer: Self-pay | Admitting: *Deleted

## 2019-07-09 NOTE — Telephone Encounter (Signed)
Pt rescheduled his procedure to 08/26/2019 and is aware that I will mail out prep instructions and Covid screening information.  Pt voiced understanding.

## 2019-07-15 NOTE — Progress Notes (Signed)
[facility] Banding Note:    Pleasant 74 year old male presenting with Grade 2-3 symptomatic hemorrhoids. History of polyps with large polyp removed via piecemeal fashion in 2017 by Dr. Arnoldo Morale. Due to COVID restrictions, this was on hold until a more elective setting. He desires to have hemorrhoid banding in the interim due to bothersome symptoms. Colonoscopy on schedule for March 2021. Right anterior banded at last appointment. He has noted improvement since first banding. Still with low volume bleeding, itching, discomfort and burning at times.   The patient presents with symptomatic grade 2 hemorrhoids, unresponsive to maximal medical therapy, requesting rubber band ligation of his hemorrhoidal disease. All risks, benefits, and alternative forms of therapy were described and informed consent was obtained.  The decision was made to band the left lateral internal hemorrhoid, and the [place] was used to perform band ligation without complication. Digital anorectal examination was then performed to assure proper positioning of the band, and to adjust the banded tissue as required. The patient was discharged home without pain or other issues. Dietary and behavioral recommendations were given, along with follow-up instructions. The patient will return in 2 weeks for followup and possible additional banding as required.  No complications were encountered and the patient tolerated the procedure well.   Annitta Needs, PhD, ANP-BC [facility]

## 2019-07-17 ENCOUNTER — Encounter: Payer: Self-pay | Admitting: Gastroenterology

## 2019-07-17 ENCOUNTER — Ambulatory Visit (INDEPENDENT_AMBULATORY_CARE_PROVIDER_SITE_OTHER): Payer: PPO | Admitting: Gastroenterology

## 2019-07-17 ENCOUNTER — Other Ambulatory Visit: Payer: Self-pay

## 2019-07-17 VITALS — BP 135/71 | HR 71 | Temp 96.9°F | Ht 70.0 in | Wt 229.4 lb

## 2019-07-17 DIAGNOSIS — K641 Second degree hemorrhoids: Secondary | ICD-10-CM

## 2019-07-17 NOTE — Patient Instructions (Signed)
Continue to avoid straining and limit toilet time.   Add Benefiber 1-2 teaspoons daily to help with productive stools and avoiding constipation.  We will see you in 2 weeks for repeat banding!  I enjoyed seeing you again today! As you know, I value our relationship and want to provide genuine, compassionate, and quality care. I welcome your feedback. If you receive a survey regarding your visit,  I greatly appreciate you taking time to fill this out. See you next time!  Annitta Needs, PhD, ANP-BC [facility]

## 2019-07-29 ENCOUNTER — Ambulatory Visit: Payer: PPO | Admitting: Gastroenterology

## 2019-07-31 ENCOUNTER — Other Ambulatory Visit: Payer: Self-pay

## 2019-07-31 ENCOUNTER — Ambulatory Visit: Payer: PPO | Admitting: Gastroenterology

## 2019-07-31 DIAGNOSIS — Z1389 Encounter for screening for other disorder: Secondary | ICD-10-CM | POA: Diagnosis not present

## 2019-07-31 DIAGNOSIS — Z0001 Encounter for general adult medical examination with abnormal findings: Secondary | ICD-10-CM | POA: Diagnosis not present

## 2019-07-31 DIAGNOSIS — E6609 Other obesity due to excess calories: Secondary | ICD-10-CM | POA: Diagnosis not present

## 2019-07-31 DIAGNOSIS — N3281 Overactive bladder: Secondary | ICD-10-CM

## 2019-07-31 DIAGNOSIS — J309 Allergic rhinitis, unspecified: Secondary | ICD-10-CM | POA: Diagnosis not present

## 2019-07-31 DIAGNOSIS — I10 Essential (primary) hypertension: Secondary | ICD-10-CM | POA: Diagnosis not present

## 2019-07-31 DIAGNOSIS — E7849 Other hyperlipidemia: Secondary | ICD-10-CM | POA: Diagnosis not present

## 2019-07-31 DIAGNOSIS — Z6833 Body mass index (BMI) 33.0-33.9, adult: Secondary | ICD-10-CM | POA: Diagnosis not present

## 2019-07-31 MED ORDER — OXYBUTYNIN CHLORIDE 5 MG PO TABS: 5.0000 mg | ORAL_TABLET | Freq: Every day | ORAL | 11 refills | Status: DC

## 2019-08-03 DIAGNOSIS — F4312 Post-traumatic stress disorder, chronic: Secondary | ICD-10-CM | POA: Diagnosis not present

## 2019-08-06 ENCOUNTER — Other Ambulatory Visit: Payer: Self-pay

## 2019-08-06 ENCOUNTER — Encounter: Payer: Self-pay | Admitting: Gastroenterology

## 2019-08-06 ENCOUNTER — Ambulatory Visit (INDEPENDENT_AMBULATORY_CARE_PROVIDER_SITE_OTHER): Payer: PPO | Admitting: Gastroenterology

## 2019-08-06 VITALS — BP 149/90 | HR 73 | Temp 97.3°F | Ht 70.0 in | Wt 230.4 lb

## 2019-08-06 DIAGNOSIS — K641 Second degree hemorrhoids: Secondary | ICD-10-CM

## 2019-08-06 NOTE — Progress Notes (Signed)
[facility] Banding Note:  Pleasant 74 year old male presenting with Grade2-3 symptomatic hemorrhoids. History of polyps with large polyp removed via piecemeal fashion in 2017 by Dr. Arnoldo Morale. Due to COVID restrictions, this had been on hold until a more elective setting. Due to symptomatic hemorrhoids, hemorrhoid banding was pursued in the interim due to bothersome symptoms. Colonoscopy on schedule for March 2021. Previously banding of right anterior and most recently left lateral. Will pursue banding of right posterior. He has noted stepwise improvement with each banding. Now with limited low volume hematochezia. Improved burning and itching from last time. Still with minor symptoms.   The patient presents with symptomatic grade 2 hemorrhoids, unresponsive to maximal medical therapy, requesting rubber band ligation of his hemorrhoidal disease. All risks, benefits, and alternative forms of therapy were described and informed consent was obtained.  The decision was made to band the right posterior internal hemorrhoid, and the [place] was used to perform band ligation without complication. Digital anorectal examination was then performed to assure proper positioning of the band, and to adjust the banded tissue as required. The patient was discharged home without pain or other issues. Dietary and behavioral recommendations were given, along with follow-up instructions. The patient will return after colonoscopy for followup. I did discuss with him that neutral position banding could be undertaken if he has any lingering symptoms. Keep plans for surveillance colonoscopy upcoming.   No complications were encountered and the patient tolerated the procedure well.  Annitta Needs, PhD, ANP-BC [facility]

## 2019-08-06 NOTE — Patient Instructions (Signed)
I'm glad you are doing better!  We banded the third column today. You may need a 4th banding if you still have some lingering symptoms, but you may not!   I have set up an appointment for after the colonoscopy, but you can cancel this if you feel you don't need further banding.  Continue with limited toilet time, no straining, and taking fiber daily.  I enjoyed seeing you again today! As you know, I value our relationship and want to provide genuine, compassionate, and quality care. I welcome your feedback. If you receive a survey regarding your visit,  I greatly appreciate you taking time to fill this out. See you next time!  Annitta Needs, PhD, ANP-BC [facility]

## 2019-08-10 ENCOUNTER — Telehealth: Payer: Self-pay | Admitting: Cardiovascular Disease

## 2019-08-10 ENCOUNTER — Telehealth: Payer: Self-pay

## 2019-08-10 DIAGNOSIS — M25562 Pain in left knee: Secondary | ICD-10-CM | POA: Diagnosis not present

## 2019-08-10 NOTE — Telephone Encounter (Signed)
Our office will keep an eye out for the clearance form to come Raliegh Ip for upcoming surgery set for 09/15/19.

## 2019-08-10 NOTE — Telephone Encounter (Signed)
Patient is calling to inform Dr. Sallyanne Kuster that [facility] will be contacting the office for a surgical clearance for a procedure scheduled for 09/15/19.

## 2019-08-10 NOTE — Telephone Encounter (Signed)
Request for surgical clearance:  1. What type of surgery is being performed? LEFT TKR   2. When is this surgery scheduled? TBD   3. What type of clearance is required (medical clearance vs. Pharmacy clearance to hold med vs. Both)? MEDICAL  4. Are there any medications that need to be held prior to surgery and how long? NONE LISTED   5. Practice name and name of physician performing surgery? [facility] Charise Carwin, MD   ATTN:KELLY  6. What is your office phone number  [phone]    7.   What is your office fax number  [phone]  8.   Anesthesia type (None, local, MAC, general) ? NOT LISTED
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
## 2019-08-13 ENCOUNTER — Other Ambulatory Visit: Payer: Self-pay

## 2019-08-13 ENCOUNTER — Telehealth: Payer: Self-pay | Admitting: Urology

## 2019-08-13 DIAGNOSIS — N3281 Overactive bladder: Secondary | ICD-10-CM

## 2019-08-13 MED ORDER — OXYBUTYNIN CHLORIDE 5 MG PO TABS: 5.0000 mg | ORAL_TABLET | Freq: Every day | ORAL | 11 refills | Status: DC

## 2019-08-13 NOTE — Telephone Encounter (Signed)
Pt called and said he needed a refill on his medication

## 2019-08-13 NOTE — Progress Notes (Signed)
Pt called about rx not at pharmacy. Initial Rx was sent to wrong pharmacy. rx resubmitted to new fax of [phone] per pt request

## 2019-08-13 NOTE — Telephone Encounter (Signed)
Okay, please ask them which medication when they call for refills.

## 2019-08-24 ENCOUNTER — Other Ambulatory Visit (HOSPITAL_COMMUNITY)
Admission: RE | Admit: 2019-08-24 | Discharge: 2019-08-24 | Disposition: A | Discharge status: Home / Self Care | Payer: PPO | Source: Ambulatory Visit | Attending: Internal Medicine | Admitting: Internal Medicine

## 2019-08-24 ENCOUNTER — Other Ambulatory Visit: Payer: Self-pay

## 2019-08-24 DIAGNOSIS — Z01812 Encounter for preprocedural laboratory examination: Secondary | ICD-10-CM | POA: Insufficient documentation

## 2019-08-24 DIAGNOSIS — Z20822 Contact with and (suspected) exposure to covid-19: Secondary | ICD-10-CM | POA: Diagnosis not present

## 2019-08-24 LAB — SARS CORONAVIRUS 2 (TAT 6-24 HRS): SARS Coronavirus 2: NEGATIVE

## 2019-08-26 ENCOUNTER — Encounter (HOSPITAL_COMMUNITY): Payer: Self-pay | Admitting: Internal Medicine

## 2019-08-26 ENCOUNTER — Ambulatory Visit (HOSPITAL_COMMUNITY)
Admission: RE | Admit: 2019-08-26 | Discharge: 2019-08-26 | Disposition: A | Discharge status: Home / Self Care | Payer: PPO | Attending: Internal Medicine | Admitting: Internal Medicine

## 2019-08-26 ENCOUNTER — Other Ambulatory Visit: Payer: Self-pay

## 2019-08-26 ENCOUNTER — Encounter (HOSPITAL_COMMUNITY)
Admission: RE | Disposition: A | Discharge status: Home / Self Care | Payer: Self-pay | Source: Home / Self Care | Attending: Internal Medicine

## 2019-08-26 DIAGNOSIS — E785 Hyperlipidemia, unspecified: Secondary | ICD-10-CM | POA: Insufficient documentation

## 2019-08-26 DIAGNOSIS — N400 Enlarged prostate without lower urinary tract symptoms: Secondary | ICD-10-CM | POA: Diagnosis not present

## 2019-08-26 DIAGNOSIS — Z79899 Other long term (current) drug therapy: Secondary | ICD-10-CM | POA: Diagnosis not present

## 2019-08-26 DIAGNOSIS — Z09 Encounter for follow-up examination after completed treatment for conditions other than malignant neoplasm: Secondary | ICD-10-CM | POA: Insufficient documentation

## 2019-08-26 DIAGNOSIS — K5730 Diverticulosis of large intestine without perforation or abscess without bleeding: Secondary | ICD-10-CM | POA: Diagnosis not present

## 2019-08-26 DIAGNOSIS — Z7982 Long term (current) use of aspirin: Secondary | ICD-10-CM | POA: Insufficient documentation

## 2019-08-26 DIAGNOSIS — I2510 Atherosclerotic heart disease of native coronary artery without angina pectoris: Secondary | ICD-10-CM | POA: Diagnosis not present

## 2019-08-26 DIAGNOSIS — K219 Gastro-esophageal reflux disease without esophagitis: Secondary | ICD-10-CM | POA: Diagnosis not present

## 2019-08-26 DIAGNOSIS — D125 Benign neoplasm of sigmoid colon: Secondary | ICD-10-CM | POA: Insufficient documentation

## 2019-08-26 DIAGNOSIS — D120 Benign neoplasm of cecum: Secondary | ICD-10-CM | POA: Insufficient documentation

## 2019-08-26 DIAGNOSIS — K635 Polyp of colon: Secondary | ICD-10-CM

## 2019-08-26 DIAGNOSIS — Z86010 Personal history of colonic polyps: Secondary | ICD-10-CM | POA: Insufficient documentation

## 2019-08-26 DIAGNOSIS — Z87891 Personal history of nicotine dependence: Secondary | ICD-10-CM | POA: Insufficient documentation

## 2019-08-26 DIAGNOSIS — D122 Benign neoplasm of ascending colon: Secondary | ICD-10-CM | POA: Insufficient documentation

## 2019-08-26 DIAGNOSIS — K626 Ulcer of anus and rectum: Secondary | ICD-10-CM | POA: Diagnosis not present

## 2019-08-26 HISTORY — PX: COLONOSCOPY: SHX5424

## 2019-08-26 HISTORY — PX: POLYPECTOMY: SHX5525

## 2019-08-26 SURGERY — COLONOSCOPY
Anesthesia: Moderate Sedation

## 2019-08-26 MED ORDER — MEPERIDINE HCL 50 MG/ML IJ SOLN
INTRAMUSCULAR | Status: AC
  Filled 2019-08-26: qty 1

## 2019-08-26 MED ORDER — ONDANSETRON HCL 4 MG/2ML IJ SOLN
INTRAMUSCULAR | Status: DC | PRN
  Administered 2019-08-26: 4 mg via INTRAVENOUS

## 2019-08-26 MED ORDER — MIDAZOLAM HCL 5 MG/5ML IJ SOLN
INTRAMUSCULAR | Status: AC
  Filled 2019-08-26: qty 5

## 2019-08-26 MED ORDER — MIDAZOLAM HCL 5 MG/5ML IJ SOLN
INTRAMUSCULAR | Status: DC | PRN
  Administered 2019-08-26 (×4): 1 mg via INTRAVENOUS
  Administered 2019-08-26: 2 mg via INTRAVENOUS
  Administered 2019-08-26 (×3): 1 mg via INTRAVENOUS
  Administered 2019-08-26: 2 mg via INTRAVENOUS

## 2019-08-26 MED ORDER — MIDAZOLAM HCL 5 MG/5ML IJ SOLN
INTRAMUSCULAR | Status: AC
  Filled 2019-08-26: qty 10

## 2019-08-26 MED ORDER — STERILE WATER FOR IRRIGATION IR SOLN
Status: DC | PRN
  Administered 2019-08-26: 100 mL

## 2019-08-26 MED ORDER — ONDANSETRON HCL 4 MG/2ML IJ SOLN
INTRAMUSCULAR | Status: AC
  Filled 2019-08-26: qty 2

## 2019-08-26 MED ORDER — MEPERIDINE HCL 100 MG/ML IJ SOLN
INTRAMUSCULAR | Status: DC | PRN
  Administered 2019-08-26: 15 mg via INTRAVENOUS
  Administered 2019-08-26: 25 mg via INTRAVENOUS
  Administered 2019-08-26: 10 mg via INTRAVENOUS

## 2019-08-26 MED ORDER — SODIUM CHLORIDE 0.9 % IV SOLN: INTRAVENOUS | Status: DC

## 2019-08-26 NOTE — Op Note (Signed)
[facility] Patient Name: Casey Castillo Procedure Date: 08/26/2019 8:46 AM MRN: [id number] Date of Birth: [birth year] Attending MD: Norvel Richards , MD CSN: [id number] Age: 74 Admit Type: Outpatient Procedure:                Colonoscopy Indications:              High risk colon cancer surveillance: Personal                            history of colonic polyps Providers:                Norvel Richards, MD, Rosina Lowenstein, RN, Randa Spike, Technician Referring MD:              Medicines:                Meperidine 50 mg IV, Midazolam 11 mg IV,                            Ondansetron 4 mg IV Complications:            No immediate complications. Estimated Blood Loss:     Estimated blood loss was minimal. Procedure:                Pre-Anesthesia Assessment:                           - Prior to the procedure, a History and Physical                            was performed, and patient medications and                            allergies were reviewed. The patient's tolerance of                            previous anesthesia was also reviewed. The risks                            and benefits of the procedure and the sedation                            options and risks were discussed with the patient.                            All questions were answered, and informed consent                            was obtained. Prior Anticoagulants: The patient has                            taken no previous anticoagulant or antiplatelet  agents. ASA Grade Assessment: II - A patient with                            mild systemic disease. After reviewing the risks                            and benefits, the patient was deemed in                            satisfactory condition to undergo the procedure.                           After obtaining informed consent, the colonoscope                            was passed under direct  vision. Throughout the                            procedure, the patient's blood pressure, pulse, and                            oxygen saturations were monitored continuously. The                            [id number] scope was introduced through                            the anus and advanced to the the cecum, identified                            by appendiceal orifice and ileocecal valve. The                            colonoscopy was performed without difficulty. The                            patient tolerated the procedure well. The quality                            of the bowel preparation was adequate. Scope In: 9:05:33 AM Scope Out: 9:53:27 AM Scope Withdrawal Time: 0 hours 44 minutes 46 seconds  Total Procedure Duration: 0 hours 47 minutes 54 seconds  Findings:      The perianal and digital rectal examinations were normal.      Scattered medium-mouthed diverticula were found in the sigmoid colon and       descending colon.      Seven semi-pedunculated polyps were found in the cecum. The polyps were       3 to 7 mm in size. These polyps were removed with a cold snare.       Resection and retrieval were complete. Estimated blood loss was minimal.      Four sessile polyps were found in the ascending colon. The polyps were 4       to 6 mm in size. These polyps were removed with a cold  snare. Resection       and retrieval were complete. Estimated blood loss was minimal. Estimated       blood loss was minimal.      Two sessile polyps were found in the sigmoid colon and splenic flexure.       The polyps were 4 to 6 mm in size. These polyps were removed with a cold       snare. Resection and retrieval were complete. Estimated blood loss was       minimal. Estimated blood loss was minimal.      A 25 x 7 mmmm polyp was found in the ascending colon (5 cm distal to the       ileocecal valve). The polyp was sessile. It lifted nicely with 7 cc of       [place] injected  submucosally. The polyp was removed with a hot snare.       Piecemeal fashion. Resection and retrieval were complete. Estimated       blood loss was minimal. Defect closed with 2 clips.      Single hemorrhoid band remains in place in the rectum with adjacent       areas of ulceration. The exam was otherwise without abnormality on       direct and retroflexion views. Impression:               - Diverticulosis in the sigmoid colon and in the                            descending colon.                           - Seven 3 to 7 mm polyps in the cecum, removed with                            a cold snare. Resected and retrieved.                           - Four 4 to 6 mm polyps in the ascending colon,                            removed with a cold snare. Resected and retrieved.                           - Two 4 to 6 mm polyps in the sigmoid colon and at                            the splenic flexure, removed with a cold snare.                            Resected and retrieved.                           - One 25 mm polyp in the ascending colon, removed                            with a hot snare. Resected and retrieved. Clips  placed                           - The examination was otherwise normal on direct                            and retroflexion views. Persisting hemorrhoidal band Moderate Sedation:      Moderate (conscious) sedation was administered by the endoscopy nurse       and supervised by the endoscopist. The following parameters were       monitored: oxygen saturation, heart rate, blood pressure, respiratory       rate, EKG, adequacy of pulmonary ventilation, and response to care.       Total physician intraservice time was 57 minutes. Recommendation:           - Patient has a contact number available for                            emergencies. The signs and symptoms of potential                            delayed complications were discussed with the                             patient. Return to normal activities tomorrow.                            Written discharge instructions were provided to the                            patient.                           - Advance diet as tolerated. Follow-up on                            pathology. Anticipate repeat colonoscopy in 6                            months assuming no overt neoplasm in histology. No                            clip till MRI gone. Return for touchup hemorrhoid                            banding per original plan. Procedure Code(s):        --- Professional ---                           [phone], Colonoscopy, flexible; with removal of                            tumor(s), polyp(s), or other lesion(s) by snare                            technique  [id number], Moderate sedation; each additional 15                            minutes intraservice time                           99153, Moderate sedation; each additional 15                            minutes intraservice time                           99153, Moderate sedation; each additional 15                            minutes intraservice time                           G0500, Moderate sedation services provided by the                            same physician or other qualified health care                            professional performing a gastrointestinal                            endoscopic service that sedation supports,                            requiring the presence of an independent trained                            observer to assist in the monitoring of the                            patient's level of consciousness and physiological                            status; initial 15 minutes of intra-service time;                            patient age 60 years or older (additional time may                            be reported with [phone], as appropriate) Diagnosis Code(s):        --- Professional  ---                           K63.5, Polyp of colon                           Z86.010, Personal history of colonic polyps                           K57.30, Diverticulosis of large  intestine without                            perforation or abscess without bleeding CPT copyright 2019 [employer]. All rights reserved. The codes documented in this report are preliminary and upon coder review may  be revised to meet current compliance requirements. [name]. Rahi Chandonnet, MD Norvel Richards, MD 08/26/2019 10:09:02 AM This report has been signed electronically. Number of Addenda: 0

## 2019-08-26 NOTE — Progress Notes (Signed)
EKG placement lead sites on patient's skin wiped with alcohol upon removal of leads due to patient history of allergy to tape.

## 2019-08-26 NOTE — H&P (Signed)
'@LOGO' @   Primary Care Physician:  Redmond School, MD Primary Gastroenterologist:  Dr. Gala Romney  Pre-Procedure History & Physical: HPI:  Casey Castillo is a 74 y.o. male here for surveillance colonoscopy.  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  Status post hemorrhoid banding recently.  Marked improvement.  Scant episodes of bleeding and protrusion.  Patient states overall much improved.  Past Medical History:  Diagnosis Date  . Arthritis   . Cervical myelopathy with cervical radiculopathy   . Claustrophobia   . Coronary artery disease    5%blockage on left anterior descending Dr. Sallyanne Kuster   . Diverticulitis    hx of  . Enlarged prostate    takes flomax  . GERD (gastroesophageal reflux disease)   . History of blood clots    november, 5 1995- left leg; 2014 - left calf  . Hyperlipidemia    primary Dr. Selena Batten  . Kidney stone    hx of sees Dr. Chauncey Cruel. [place] [facility]  . Pneumonia    as a child  . PTSD (post-traumatic stress disorder)   . PTSD (post-traumatic stress disorder)   . Seasonal allergies   . Urinary frequency    hx of     Past Surgical History:  Procedure Laterality Date  . ANTERIOR CERVICAL DECOMP/DISCECTOMY FUSION  01/28/2012   Procedure: ANTERIOR CERVICAL DECOMPRESSION/DISCECTOMY FUSION 2 LEVELS;  Surgeon: Kristeen Miss, MD;  Location: [facility];  Service: Neurosurgery;  Laterality: N/A;  Cervical six-seven, cervical seven-thoracic one Anterior cervical decompression/diskectomy/fusion  . ANTERIOR CERVICAL DECOMP/DISCECTOMY FUSION N/A 12/24/2017   Procedure: ANTERIOR CERVICAL DECOMPRESSION/DISCECTOMY FUSION CERVICAL FOUR-FIVE/CERVICAL FIVE-SIX TWO LEVELS;  Surgeon: Kristeen Miss, MD;  Location: [facility];  Service: Neurosurgery;  Laterality: N/A;  ANTERIOR CERVICAL DECOMPRESSION/DISCECTOMY FUSION CERVICAL FOUR-FIVE/CERVICAL FIVE-SIX TWO LEVELS  . CARDIAC CATHETERIZATION     09-06-11   . CARDIOVASCULAR STRESS TEST     2006 and 2008 at [facility]  heart and vascular, 2011 myoview and echo  . COLONOSCOPY    . COLONOSCOPY N/A 08/30/2015   Dr. Arnoldo Morale: one 20 mm polyp in proximal ascending colon, removed piecemeal using hot snare. Resected and retrieved. Diverticulosis in sigmoid colon. Path with tubular adenoma, no high grade dysplasia. 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 engrown  toenail Bilateral   . EYE SURGERY     Laser for torn retina  . HEMORRHOID SURGERY     sept 8, 2008  . KIDNEY STONE SURGERY     lithotripsy 1980's  . KNEE SURGERY     left 04-11-94 ( has had two surgeries to left knee)  . NASAL SINUS SURGERY     Jun 30 1998  . NM MYOVIEW LTD  07/20/2009   no ischemia  . PILONIDAL CYST / SINUS EXCISION     surgery 01-08-74  . RADIOLOGY WITH ANESTHESIA N/A 06/12/2018   Procedure: MRI OF CHEST WITH AND WITHOUT CONTRAST;  Surgeon: Radiologist, Medication, MD;  Location: [place];  Service: Radiology;  Laterality: N/A;  . SHOULDER SURGERY     right 12-20-2003  . TRIGGER FINGER RELEASE     03-21-98 right hand ring finger  . US ECHOCARDIOGRAPHY  07/20/2009   LA & RA mildly dilated,trace MR,TR.    Prior to Admission medications   Medication Sig Start Date End Date Taking? Authorizing Provider  acetaminophen (TYLENOL) 650 MG CR tablet Take 650-1,300 mg by mouth every 8 (eight) hours as needed for pain.    Yes [provider]  aspirin EC 81 MG tablet Take 81 mg by mouth daily.   Yes [provider]  Biotin 5000 MCG TABS Take 5,000 mcg by mouth daily.    Yes [provider]  calcium elemental as carbonate (TUMS ULTRA 1000) 400 MG chewable tablet Chew 2,000-3,000 mg by mouth daily as needed for heartburn.   Yes [provider]  Cholecalciferol (VITAMIN D) 2000 UNITS CAPS Take 2,000 Units by mouth at bedtime.    Yes [provider]  clobetasol cream (TEMOVATE) 1.02 % Apply 1 application topically 2 (two) times daily as needed (dry skin).   Yes [provider]  diphenhydrAMINE (BENADRYL) 50 MG tablet Take 50 mg by mouth at bedtime as needed for sleep (sleep.).    Yes [provider]  Emollient (AQUAPHOR EX) Apply 1 application topically 3 (three) times daily as needed (severe dry skin.).    Yes [provider]  loratadine (CLARITIN) 10 MG tablet Take 10 mg by mouth at bedtime.    Yes [provider]  Lysine HCl 500 MG TABS Take 500 mg by mouth at bedtime.    Yes [provider]  Melatonin 5 MG TABS Take 5 mg by mouth at bedtime as needed (sleep).   Yes [provider]  Omega-3 Fatty Acids (FISH OIL) 1200 MG CAPS Take 1,200 mg by mouth at bedtime.   Yes [provider]  oxybutynin (DITROPAN) 5 MG tablet Take 1 tablet (5 mg total) by mouth daily. Patient taking differently: Take 2.5 mg by mouth daily.  08/13/19  Yes Dahlstedt, Annie Main, MD  pantoprazole (PROTONIX) 40 MG tablet Take 40 mg by mouth 2 (two) times daily.  03/24/14  Yes [provider]  simvastatin (ZOCOR) 40 MG tablet Take 1 tablet (40 mg total) by mouth daily. 02/09/19  Yes Croitoru, Mihai, MD  tadalafil (CIALIS) 5 MG tablet Take 5 mg by mouth every evening.    Yes [provider]  Tamsulosin HCl (FLOMAX) 0.4 MG CAPS Take 0.4 mg by mouth 2 (two) times daily.    Yes [provider]  hydrocortisone (ANUSOL-HC) 2.5 % rectal cream Place 1 application rectally 2 (two) times daily. Patient not taking: Reported on 08/21/2019 05/06/19   Annitta Needs, NP  SUPREP BOWEL PREP KIT 17.5-3.13-1.6 GM/177ML SOLN Take 354 mLs by mouth once. 06/16/19   [provider]  valACYclovir (VALTREX) 1000 MG tablet Take 500 mg by mouth daily as needed (fever blisters.).     [provider]    Allergies as of 07/09/2019 - Review Complete 07/03/2019  Allergen Reaction Noted  . Adhesive [tape] Hives, Rash, and Other (See Comments) 05/26/2013    Family History  Problem Relation Age of Onset  . Heart attack Father   .  Dementia Mother   . Heart disease Other   . Colon cancer Neg Hx     Social History   Socioeconomic History  . Marital status: Widowed    Spouse name: Not on file  . Number of children: Not on file  . Years of education: Not on file  . Highest education level: Not on file  Occupational History  . Not on file  Tobacco Use  . Smoking status: Former Smoker    Packs/day: 1.00    Years: 20.00    Pack years: 20.00    Types: Cigarettes    Quit date: 1972    Years since quitting: 49.2  . Smokeless tobacco: Never Used  . Tobacco comment: quit smokin  cigarettes at age 89  Substance and Sexual Activity  . Alcohol use: No  . Drug use: No  . Sexual activity: Not on file  Other Topics Concern  . Not on file  Social History Narrative  . Not on file   Social Determinants of Health   Financial Resource Strain:   . Difficulty of Paying Living Expenses:   Food Insecurity:   . Worried About Charity fundraiser in the Last Year:   . Arboriculturist in the Last Year:   Transportation Needs:   . Film/video editor (Medical):   Marland Kitchen Lack of Transportation (Non-Medical):   Physical Activity:   . Days of Exercise per Week:   . Minutes of Exercise per Session:   Stress:   . Feeling of Stress :   Social Connections:   . Frequency of Communication with Friends and Family:   . Frequency of Social Gatherings with Friends and Family:   . Attends Religious Services:   . Active Member of Clubs or Organizations:   . Attends Archivist Meetings:   Marland Kitchen Marital Status:   Intimate Partner Violence:   . Fear of Current or Ex-Partner:   . Emotionally Abused:   Marland Kitchen Physically Abused:   . Sexually Abused:     Review of Systems: See HPI, otherwise negative ROS  Physical Exam: BP 127/86   Pulse 72   Temp 98.5 F (36.9 C) (Oral)   Resp 20   Ht '5\' 10"'  (1.778 m)   Wt 98.4 kg   SpO2 97%   BMI 31.14 kg/m  General:   Alert,  Well-developed, well-nourished, pleasant and cooperative in  NAD s. Neck:  Supple; no masses or thyromegaly. No significant cervical adenopathy. Lungs:  Clear throughout to auscultation.   No wheezes, crackles, or rhonchi. No acute distress. Heart:  Regular rate and rhythm; no murmurs, clicks, rubs,  or gallops. Abdomen: Non-distended, normal bowel sounds.  Soft and nontender without appreciable mass or hepatosplenomegaly.  Pulses:  Normal pulses noted. Extremities:  Without clubbing or edema.  Impression/Plan: 74 year old gentleman with a large ascending colon polyp piecemeal removed previously.  Here for surveillance colonoscopy.  Recent hemorrhoid banding-successful marked improvement.  May need 1 more session. The risks, benefits, limitations, alternatives and imponderables have been reviewed with the patient. Questions have been answered. All parties are agreeable.      Notice: This dictation was prepared with Dragon dictation along with smaller phrase technology. Any transcriptional errors that result from this process are unintentional and may not be corrected upon review.

## 2019-08-26 NOTE — Discharge Instructions (Signed)
Colonoscopy Discharge Instructions  Read the instructions outlined below and refer to this sheet in the next few weeks. These discharge instructions provide you with general information on caring for yourself after you leave the hospital. Your doctor may also give you specific instructions. While your treatment has been planned according to the most current medical practices available, unavoidable complications occasionally occur. If you have any problems or questions after discharge, call Dr. Gala Romney at [phone]. ACTIVITY  You may resume your regular activity, but move at a slower pace for the next 24 hours.   Take frequent rest periods for the next 24 hours.   Walking will help get rid of the air and reduce the bloated feeling in your belly (abdomen).   No driving for 24 hours (because of the medicine (anesthesia) used during the test).    Do not sign any important legal documents or operate any machinery for 24 hours (because of the anesthesia used during the test).  NUTRITION  Drink plenty of fluids.   You may resume your normal diet as instructed by your doctor.   Begin with a light meal and progress to your normal diet. Heavy or fried foods are harder to digest and may make you feel sick to your stomach (nauseated).   Avoid alcoholic beverages for 24 hours or as instructed.  MEDICATIONS  You may resume your normal medications unless your doctor tells you otherwise.  WHAT YOU CAN EXPECT TODAY  Some feelings of bloating in the abdomen.   Passage of more gas than usual.   Spotting of blood in your stool or on the toilet paper.  IF YOU HAD POLYPS REMOVED DURING THE COLONOSCOPY:  No aspirin products for 7 days or as instructed.   No alcohol for 7 days or as instructed.   Eat a soft diet for the next 24 hours.  FINDING OUT THE RESULTS OF YOUR TEST Not all test results are available during your visit. If your test results are not back during the visit, make an appointment  with your caregiver to find out the results. Do not assume everything is normal if you have not heard from your caregiver or the medical facility. It is important for you to follow up on all of your test results.  SEEK IMMEDIATE MEDICAL ATTENTION IF:  You have more than a spotting of blood in your stool.   Your belly is swollen (abdominal distention).   You are nauseated or vomiting.   You have a temperature over 101.   You have abdominal pain or discomfort that is severe or gets worse throughout the day.   Colon polyp, diverticulosis and hemorrhoid information provided  No MRI until clips gone  You will need a repeat colonoscopy in 6 months.  Multiple polyps removed today.  Keep upcoming follow-up with [facility] as scheduled  Further recommendations to follow pending review of pathology report  At patient request I called Elenore Rota, brother, [phone]--[phone] -reviewed results    Colon Polyps  Polyps are tissue growths inside the body. Polyps can grow in many places, including the large intestine (colon). A polyp may be a round bump or a mushroom-shaped growth. You could have one polyp or several. Most colon polyps are noncancerous (benign). However, some colon polyps can become cancerous over time. Finding and removing the polyps early can help prevent this. What are the causes? The exact cause of colon polyps is not known. What increases the risk? You are more likely to develop this condition if you:  Have a family history of colon cancer or colon polyps.  Are older than 46 or older than 45 if you are African American.  Have inflammatory bowel disease, such as ulcerative colitis or Crohn's disease.  Have certain hereditary conditions, such as: ? Familial adenomatous polyposis. ? Lynch syndrome. ? Turcot syndrome. ? Peutz-Jeghers syndrome.  Are overweight.  Smoke cigarettes.  Do not get enough exercise.  Drink too much alcohol.  Eat a diet that is high in fat and red  meat and low in fiber.  Had childhood cancer that was treated with abdominal radiation. What are the signs or symptoms? Most polyps do not cause symptoms. If you have symptoms, they may include:  Blood coming from your rectum when having a bowel movement.  Blood in your stool. The stool may look dark red or black.  Abdominal pain.  A change in bowel habits, such as constipation or diarrhea. How is this diagnosed? This condition is diagnosed with a colonoscopy. This is a procedure in which a lighted, flexible scope is inserted into the anus and then passed into the colon to examine the area. Polyps are sometimes found when a colonoscopy is done as part of routine cancer screening tests. How is this treated? Treatment for this condition involves removing any polyps that are found. Most polyps can be removed during a colonoscopy. Those polyps will then be tested for cancer. Additional treatment may be needed depending on the results of testing. Follow these instructions at home: Lifestyle  Maintain a healthy weight, or lose weight if recommended by your health care provider.  Exercise every day or as told by your health care provider.  Do not use any products that contain nicotine or tobacco, such as cigarettes and e-cigarettes. If you need help quitting, ask your health care provider.  If you drink alcohol, limit how much you have: ? 0-1 drink a day for women. ? 0-2 drinks a day for men.  Be aware of how much alcohol is in your drink. In the [place] one drink equals one 12 oz bottle of beer (355 mL), one 5 oz glass of wine (148 mL), or one 1 oz shot of hard liquor (44 mL). Eating and drinking   Eat foods that are high in fiber, such as fruits, vegetables, and whole grains.  Eat foods that are high in calcium and vitamin D, such as milk, cheese, yogurt, eggs, liver, fish, and broccoli.  Limit foods that are high in fat, such as fried foods and desserts.  Limit the amount of red  meat and processed meat you eat, such as hot dogs, sausage, bacon, and lunch meats. General instructions  Keep all follow-up visits as told by your health care provider. This is important. ? This includes having regularly scheduled colonoscopies. ? Talk to your health care provider about when you need a colonoscopy. Contact a health care provider if:  You have new or worsening bleeding during a bowel movement.  You have new or increased blood in your stool.  You have a change in bowel habits.  You lose weight for no known reason. Summary  Polyps are tissue growths inside the body. Polyps can grow in many places, including the colon.  Most colon polyps are noncancerous (benign), but some can become cancerous over time.  This condition is diagnosed with a colonoscopy.  Treatment for this condition involves removing any polyps that are found. Most polyps can be removed during a colonoscopy. This information is not intended to replace  advice given to you by your health care provider. Make sure you discuss any questions you have with your health care provider. Document Revised: 09/12/2017 Document Reviewed: 09/12/2017 Elsevier Patient Education  [place].    Diverticulosis  Diverticulosis is a condition that develops when small pouches (diverticula) form in the wall of the large intestine (colon). The colon is where water is absorbed and stool (feces) is formed. The pouches form when the inside layer of the colon pushes through weak spots in the outer layers of the colon. You may have a few pouches or many of them. The pouches usually do not cause problems unless they become inflamed or infected. When this happens, the condition is called diverticulitis. What are the causes? The cause of this condition is not known. What increases the risk? The following factors may make you more likely to develop this condition:  Being older than age 84. Your risk for this condition  increases with age. Diverticulosis is rare among people younger than age 95. By age 75, many people have it.  Eating a low-fiber diet.  Having frequent constipation.  Being overweight.  Not getting enough exercise.  Smoking.  Taking over-the-counter pain medicines, like aspirin and ibuprofen.  Having a family history of diverticulosis. What are the signs or symptoms? In most people, there are no symptoms of this condition. If you do have symptoms, they may include:  Bloating.  Cramps in the abdomen.  Constipation or diarrhea.  Pain in the lower left side of the abdomen. How is this diagnosed? Because diverticulosis usually has no symptoms, it is most often diagnosed during an exam for other colon problems. The condition may be diagnosed by:  Using a flexible scope to examine the colon (colonoscopy).  Taking an X-ray of the colon after dye has been put into the colon (barium enema).  Having a CT scan. How is this treated? You may not need treatment for this condition. Your health care provider may recommend treatment to prevent problems. You may need treatment if you have symptoms or if you previously had diverticulitis. Treatment may include:  Eating a high-fiber diet.  Taking a fiber supplement.  Taking a live bacteria supplement (probiotic).  Taking medicine to relax your colon. Follow these instructions at home: Medicines  Take over-the-counter and prescription medicines only as told by your health care provider.  If told by your health care provider, take a fiber supplement or probiotic. Constipation prevention Your condition may cause constipation. To prevent or treat constipation, you may need to:  Drink enough fluid to keep your urine pale yellow.  Take over-the-counter or prescription medicines.  Eat foods that are high in fiber, such as beans, whole grains, and fresh fruits and vegetables.  Limit foods that are high in fat and processed sugars, such  as fried or sweet foods.  General instructions  Try not to strain when you have a bowel movement.  Keep all follow-up visits as told by your health care provider. This is important. Contact a health care provider if you:  Have pain in your abdomen.  Have bloating.  Have cramps.  Have not had a bowel movement in 3 days. Get help right away if:  Your pain gets worse.  Your bloating becomes very bad.  You have a fever or chills, and your symptoms suddenly get worse.  You vomit.  You have bowel movements that are bloody or black.  You have bleeding from your rectum. Summary  Diverticulosis is a condition  that develops when small pouches (diverticula) form in the wall of the large intestine (colon).  You may have a few pouches or many of them.  This condition is most often diagnosed during an exam for other colon problems.  Treatment may include increasing the fiber in your diet, taking supplements, or taking medicines. This information is not intended to replace advice given to you by your health care provider. Make sure you discuss any questions you have with your health care provider. Document Revised: 12/25/2018 Document Reviewed: 12/25/2018 Elsevier Patient Education  [place].    Hemorrhoids Hemorrhoids are swollen veins in and around the rectum or anus. There are two types of hemorrhoids:  Internal hemorrhoids. These occur in the veins that are just inside the rectum. They may poke through to the outside and become irritated and painful.  External hemorrhoids. These occur in the veins that are outside the anus and can be felt as a painful swelling or hard lump near the anus. Most hemorrhoids do not cause serious problems, and they can be managed with home treatments such as diet and lifestyle changes. If home treatments do not help the symptoms, procedures can be done to shrink or remove the hemorrhoids. What are the causes? This condition is caused by  increased pressure in the anal area. This pressure may result from various things, including:  Constipation.  Straining to have a bowel movement.  Diarrhea.  Pregnancy.  Obesity.  Sitting for long periods of time.  Heavy lifting or other activity that causes you to strain.  Anal sex.  Riding a bike for a long period of time. What are the signs or symptoms? Symptoms of this condition include:  Pain.  Anal itching or irritation.  Rectal bleeding.  Leakage of stool (feces).  Anal swelling.  One or more lumps around the anus. How is this diagnosed? This condition can often be diagnosed through a visual exam. Other exams or tests may also be done, such as:  An exam that involves feeling the rectal area with a gloved hand (digital rectal exam).  An exam of the anal canal that is done using a small tube (anoscope).  A blood test, if you have lost a significant amount of blood.  A test to look inside the colon using a flexible tube with a camera on the end (sigmoidoscopy or colonoscopy). How is this treated? This condition can usually be treated at home. However, various procedures may be done if dietary changes, lifestyle changes, and other home treatments do not help your symptoms. These procedures can help make the hemorrhoids smaller or remove them completely. Some of these procedures involve surgery, and others do not. Common procedures include:  Rubber band ligation. Rubber bands are placed at the base of the hemorrhoids to cut off their blood supply.  Sclerotherapy. Medicine is injected into the hemorrhoids to shrink them.  Infrared coagulation. A type of light energy is used to get rid of the hemorrhoids.  Hemorrhoidectomy surgery. The hemorrhoids are surgically removed, and the veins that supply them are tied off.  Stapled hemorrhoidopexy surgery. The surgeon staples the base of the hemorrhoid to the rectal wall. Follow these instructions at home: Eating and  drinking   Eat foods that have a lot of fiber in them, such as whole grains, beans, nuts, fruits, and vegetables.  Ask your health care provider about taking products that have added fiber (fiber supplements).  Reduce the amount of fat in your diet. You can do  this by eating low-fat dairy products, eating less red meat, and avoiding processed foods.  Drink enough fluid to keep your urine pale yellow. Managing pain and swelling   Take warm sitz baths for 20 minutes, 3-4 times a day to ease pain and discomfort. You may do this in a bathtub or using a portable sitz bath that fits over the toilet.  If directed, apply ice to the affected area. Using ice packs between sitz baths may be helpful. ? Put ice in a plastic bag. ? Place a towel between your skin and the bag. ? Leave the ice on for 20 minutes, 2-3 times a day. General instructions  Take over-the-counter and prescription medicines only as told by your health care provider.  Use medicated creams or suppositories as told.  Get regular exercise. Ask your health care provider how much and what kind of exercise is best for you. In general, you should do moderate exercise for at least 30 minutes on most days of the week (150 minutes each week). This can include activities such as walking, biking, or yoga.  Go to the bathroom when you have the urge to have a bowel movement. Do not wait.  Avoid straining to have bowel movements.  Keep the anal area dry and clean. Use wet toilet paper or moist towelettes after a bowel movement.  Do not sit on the toilet for long periods of time. This increases blood pooling and pain.  Keep all follow-up visits as told by your health care provider. This is important. Contact a health care provider if you have:  Increasing pain and swelling that are not controlled by treatment or medicine.  Difficulty having a bowel movement, or you are unable to have a bowel movement.  Pain or inflammation outside  the area of the hemorrhoids. Get help right away if you have:  Uncontrolled bleeding from your rectum. Summary  Hemorrhoids are swollen veins in and around the rectum or anus.  Most hemorrhoids can be managed with home treatments such as diet and lifestyle changes.  Taking warm sitz baths can help ease pain and discomfort.  In severe cases, procedures or surgery can be done to shrink or remove the hemorrhoids. This information is not intended to replace advice given to you by your health care provider. Make sure you discuss any questions you have with your health care provider. Document Revised: 10/24/2018 Document Reviewed: 10/17/2017 Elsevier Patient Education  [place].

## 2019-08-27 ENCOUNTER — Encounter: Payer: Self-pay | Admitting: Internal Medicine

## 2019-08-27 ENCOUNTER — Telehealth: Payer: Self-pay | Admitting: Internal Medicine

## 2019-08-27 LAB — SURGICAL PATHOLOGY

## 2019-08-27 NOTE — Telephone Encounter (Signed)
RMR, pt is calling back and had some concerns with the clips that were placed during his procedure. Pt has an upcoming surgery on 09/15/2019 on his knee. Pt isn't sure if he will have an upcoming MRI but wants to know how soon will the clips come out?

## 2019-08-27 NOTE — Telephone Encounter (Signed)
Clips can stay in weeks to months.  Would need a plain x-ray of the abdomen prior to contemplating an MRI.

## 2019-08-27 NOTE — Telephone Encounter (Signed)
I would get any over-the-counter topical preparation antiitch containing Benadryl and/or hydrocortisone use as needed until irritation settles down

## 2019-08-27 NOTE — Telephone Encounter (Signed)
Tried calling pt. VM is full. Will call pt back. Routing to RMR.

## 2019-08-27 NOTE — Telephone Encounter (Signed)
Noted. Spoke with pt and he is aware.

## 2019-08-27 NOTE — Telephone Encounter (Signed)
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 He said this morning he has 3 places on his chest where the tabs were and was good size, burning, itching and very uncomfortable. Pt is wanting RMR to recommend something for the allegic reaction and to call him on his cell phone. [phone]

## 2019-08-27 NOTE — Telephone Encounter (Signed)
FYI, I spoke with pt. Pt was notified of RMR's recommendations. Pt isn't happy and states when [facility] calls to ask how he did during his procedure, he's going to let them have it. Pt says he spoke with the nurses and they are aware of his allergic reactions. Pt is having some itching, burning and states he isn't happy with the nurses not using alcohol on the EKG pads

## 2019-08-28 NOTE — Telephone Encounter (Signed)
Reviewed

## 2019-08-31 DIAGNOSIS — M1712 Unilateral primary osteoarthritis, left knee: Secondary | ICD-10-CM | POA: Diagnosis not present

## 2019-09-01 DIAGNOSIS — F4310 Post-traumatic stress disorder, unspecified: Secondary | ICD-10-CM | POA: Diagnosis present

## 2019-09-01 DIAGNOSIS — M1712 Unilateral primary osteoarthritis, left knee: Secondary | ICD-10-CM | POA: Diagnosis present

## 2019-09-01 DIAGNOSIS — K219 Gastro-esophageal reflux disease without esophagitis: Secondary | ICD-10-CM | POA: Diagnosis present

## 2019-09-01 NOTE — H&P (Signed)
KNEE ARTHROPLASTY ADMISSION H&P  Patient ID: Casey Castillo MRN: [id number] DOB/AGE: Mar 08, 1946 74 y.o.  Chief Complaint: left knee pain.  Planned Procedure Date: 09/15/19 Medical Clearance by Dr. Gerarda Fraction Cardiac Clearance by Dr. Sallyanne Kuster   HPI: Casey Castillo is a 74 y.o. male with a history of PTSD, GERD, HLD, and history of DVT (postoperative and spontaneous) who presents for evaluation of OSTEOARTHRITIS  LEFT KNEE. The patient has a history of pain and functional disability in the left knee due to arthritis and has failed non-surgical conservative treatments for greater than 12 weeks to include NSAID's and/or analgesics, corticosteriod injections and activity modification.  Onset of symptoms was gradual, starting >10 years ago with gradually worsening course since that time.  He has had left knee arthroscopy x2 remotely.  Patient currently rates pain at 8 out of 10 with activity. Patient has night pain, worsening of pain with activity and weight bearing and pain that interferes with activities of daily living.  Patient has evidence of periarticular osteophytes and joint space narrowing by imaging studies.  Prior arthroscopy revealed full-thickness chondral loss patellofemoral compartment.  There is no active infection.  Past Medical History:  Diagnosis Date  . Arthritis   . Cervical myelopathy with cervical radiculopathy   . Claustrophobia   . Coronary artery disease    5%blockage on left anterior descending Dr. Sallyanne Kuster   . Diverticulitis    hx of  . Enlarged prostate    takes flomax  . GERD (gastroesophageal reflux disease)   . History of blood clots    november, 5 1995- left leg; 2014 - left calf  . Hyperlipidemia    primary Dr. Selena Batten  . Kidney stone    hx of sees Dr. Chauncey Cruel. [place] [facility]  . Pneumonia    as a child  . PTSD (post-traumatic stress disorder)   . PTSD (post-traumatic stress disorder)   . Seasonal allergies   . Urinary frequency    hx of    Past Surgical History:   Procedure Laterality Date  . ANTERIOR CERVICAL DECOMP/DISCECTOMY FUSION  01/28/2012   Procedure: ANTERIOR CERVICAL DECOMPRESSION/DISCECTOMY FUSION 2 LEVELS;  Surgeon: Kristeen Miss, MD;  Location: [facility];  Service: Neurosurgery;  Laterality: N/A;  Cervical six-seven, cervical seven-thoracic one Anterior cervical decompression/diskectomy/fusion  . ANTERIOR CERVICAL DECOMP/DISCECTOMY FUSION N/A 12/24/2017   Procedure: ANTERIOR CERVICAL DECOMPRESSION/DISCECTOMY FUSION CERVICAL FOUR-FIVE/CERVICAL FIVE-SIX TWO LEVELS;  Surgeon: Kristeen Miss, MD;  Location: [place];  Service: Neurosurgery;  Laterality: N/A;  ANTERIOR CERVICAL DECOMPRESSION/DISCECTOMY FUSION CERVICAL FOUR-FIVE/CERVICAL FIVE-SIX TWO LEVELS  . CARDIAC CATHETERIZATION     09-06-11   . CARDIOVASCULAR STRESS TEST     2006 and 2008 at [facility], 2011 myoview and echo  . COLONOSCOPY    . COLONOSCOPY N/A 08/30/2015   Dr. Arnoldo Morale: one 20 mm polyp in proximal ascending colon, removed piecemeal using hot snare. Resected and retrieved. Diverticulosis in sigmoid colon. Path with tubular adenoma, no high grade dysplasia. 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 engrown  toenail Bilateral   . EYE SURGERY     Laser for torn retina  . HEMORRHOID SURGERY     sept 8, 2008  . KIDNEY STONE SURGERY     lithotripsy 1980's  . KNEE SURGERY     left 04-11-94 ( has had two surgeries to left knee)  . NASAL SINUS SURGERY     Jun 30 1998  . NM MYOVIEW LTD  07/20/2009   no  ischemia  . PILONIDAL CYST / SINUS EXCISION     surgery 01[birth year]08[birth year]74  . RADIOLOGY WITH ANESTHESIA N/A 06/12/2018   Procedure: MRI OF CHEST WITH AND WITHOUT CONTRAST;  Surgeon: Radiologist, Medication, MD;  Location: [place];  Service: Radiology;  Laterality: N/A;  . SHOULDER SURGERY     right 12-20-2003  . TRIGGER FINGER RELEASE     03-21-98 right hand ring finger  . US ECHOCARDIOGRAPHY  07/20/2009   LA & RA mildly dilated,trace MR,TR.   Allergies   Allergen Reactions  . Adhesive [Tape] Hives, Rash and Other (See Comments)    EKG electrodes cause whelps.   Prior to Admission medications   Medication Sig Start Date End Date Taking? Authorizing Provider  acetaminophen (TYLENOL) 650 MG CR tablet Take 650-1,300 mg by mouth every 8 (eight) hours as needed for pain.     [provider]  aspirin EC 81 MG tablet Take 81 mg by mouth daily.    [provider]  Biotin 5000 MCG TABS Take 5,000 mcg by mouth daily.     [provider]  calcium elemental as carbonate (TUMS ULTRA 1000) 400 MG chewable tablet Chew 2,000-3,000 mg by mouth daily as needed for heartburn.    [provider]  Cholecalciferol (VITAMIN D) 2000 UNITS CAPS Take 2,000 Units by mouth at bedtime.     [provider]  clobetasol cream (TEMOVATE) [id number] % Apply 1 application topically 2 (two) times daily as needed (dry skin).    [provider]  diphenhydrAMINE (BENADRYL) 50 MG tablet Take 50 mg by mouth at bedtime as needed for sleep (sleep.).     [provider]  Emollient (AQUAPHOR EX) Apply 1 application topically 3 (three) times daily as needed (severe dry skin.).     [provider]  loratadine (CLARITIN) 10 MG tablet Take 10 mg by mouth at bedtime.     [provider]  Lysine HCl 500 MG TABS Take 500 mg by mouth at bedtime.     [provider]  Melatonin 5 MG TABS Take 5 mg by mouth at bedtime as needed (sleep).    [provider]  Omega-3 Fatty Acids (FISH OIL) 1200 MG CAPS Take 1,200 mg by mouth at bedtime.    [provider]  oxybutynin (DITROPAN) 5 MG tablet Take 1 tablet (5 mg total) by mouth daily. Patient taking differently: Take 2.5 mg by mouth daily.  08/13/19   Franchot Gallo, MD  pantoprazole (PROTONIX) 40 MG tablet Take 40 mg by mouth 2 (two) times daily.  03/24/14   [provider]  simvastatin (ZOCOR) 40 MG tablet Take 1 tablet (40 mg total) by mouth  daily. 02/09/19   Croitoru, Mihai, MD  tadalafil (CIALIS) 5 MG tablet Take 5 mg by mouth every evening.     [provider]  Tamsulosin HCl (FLOMAX) 0.4 MG CAPS Take 0.4 mg by mouth 2 (two) times daily.     [provider]  valACYclovir (VALTREX) 1000 MG tablet Take 500 mg by mouth daily as needed (fever blisters.).     [provider]   Social History   Socioeconomic History  . Marital status: Widowed    Spouse name: Not on file  . Number of children: Not on file  . Years of education: Not on file  . Highest education level: Not on file  Occupational History  . Not on file  Tobacco Use  . Smoking status: Former Smoker    Packs/day:  1.00    Years: 20.00    Pack years: 20.00    Types: Cigarettes    Quit date: 26    Years since quitting: 49.2  . Smokeless tobacco: Never Used  . Tobacco comment: quit smokin cigarettes at age 63  Substance and Sexual Activity  . Alcohol use: No  . Drug use: No  . Sexual activity: Not on file  Other Topics Concern  . Not on file  Social History Narrative  . Not on file   Social Determinants of Health   Financial Resource Strain:   . Difficulty of Paying Living Expenses:   Food Insecurity:   . Worried About Charity fundraiser in the Last Year:   . Arboriculturist in the Last Year:   Transportation Needs:   . Film/video editor (Medical):   Marland Kitchen Lack of Transportation (Non-Medical):   Physical Activity:   . Days of Exercise per Week:   . Minutes of Exercise per Session:   Stress:   . Feeling of Stress :   Social Connections:   . Frequency of Communication with Friends and Family:   . Frequency of Social Gatherings with Friends and Family:   . Attends Religious Services:   . Active Member of Clubs or Organizations:   . Attends Archivist Meetings:   Marland Kitchen Marital Status:    Family History  Problem Relation Age of Onset  . Heart attack Father   . Dementia Mother   . Heart disease Other   . Colon  cancer Neg Hx     ROS: Currently denies lightheadedness, dizziness, Fever, chills, CP, SOB.   No personal history of PE, MI, or CVA. No loose teeth or dentures.  Occasional ankle swelling.  History of kidney stones.  Hemorrhoids status post banding. All other systems have been reviewed and were otherwise currently negative with the exception of those mentioned in the HPI and as above.  Objective: Vitals: Ht: 5 feet 10 inches wt: 226 lbs Temp: 98.3 BP: 130/79 pulse: 71 O2 94% on room air.   Physical Exam: General: Alert, NAD.  Antalgic Gait unaided. HEENT: EOMI, Good Neck Extension  Pulm: No increased work of breathing.  Clear B/L A/P w/o crackle or wheeze. CV: RRR, No m/g/r appreciated  GI: soft, NT, ND Neuro: Neuro without gross focal deficit.  Sensation intact distally Skin: No lesions in the area of chief complaint MSK/Surgical Site: Left knee w/o redness or effusion.  Mostly medial JLT with patellar crepitus. ROM 0-120.  5/5 strength in extension and flexion.  +EHL/FHL.  NVI.  Stable varus and valgus stress.    Imaging Review Plain radiographs demonstrate severe degenerative joint disease of the left knee.  Intraoperative pictures from remote arthroscopy reveal full-thickness chondral lesions patellofemoral compartment.  Preoperative templating of the joint replacement has been completed, documented, and submitted to the Operating Room personnel in order to optimize intra-operative equipment management.  Assessment: OSTEOARTHRITIS  LEFT KNEE Principal Problem:   Primary osteoarthritis of left knee Active Problems:   History of deep vein thrombosis (DVT) of lower extremity   Hypercholesterolemia   Hemorrhoids   PTSD (post-traumatic stress disorder)   GERD (gastroesophageal reflux disease)   Plan: Plan for Procedure(s): TOTAL KNEE ARTHROPLASTY  The patient history, physical exam, clinical judgement of the provider and imaging are consistent with end stage degenerative joint  disease and total joint arthroplasty is deemed medically necessary. The treatment options including medical management, injection therapy, and arthroplasty were discussed  at length. The risks and benefits of Procedure(s): TOTAL KNEE ARTHROPLASTY were presented and reviewed.  The risks of nonoperative treatment, versus surgical intervention including but not limited to continued pain, aseptic loosening, stiffness, dislocation/subluxation, infection, bleeding, nerve injury, blood clots, cardiopulmonary complications, morbidity, mortality, among others were discussed. The patient verbalizes understanding and wishes to proceed with the plan.  Patient is being admitted for inpatient treatment for surgery, pain control, PT, prophylactic antibiotics, VTE prophylaxis, progressive ambulation, ADL's and discharge planning.   Dental prophylaxis discussed and recommended for 2 years postoperatively.   The patient does not meet the criteria for TXA dt h/o DVT.  Eliquis 2.5 mg twice daily will be used postoperatively for DVT prophylaxis due to history of DVTs in addition to SCDs, and early ambulation.  Plan for Tylenol, 100 mg Celebrex, oxycodone for pain.  Baclofen for spasm.  Continue pantoprazole for gastric protection.  Please thoroughly wipe areas of skin that have had adhesive especially cardiac monitoring leads with alcohol after removal due to history of significant skin irritation from same.  The patient is planning to be discharged home with HHPT ([facility]) in care of his brother and friend.   Patient's anticipated LOS is less than 2 midnights, meeting these requirements: - Lives within 1 hour of care - Has a competent adult at home to recover with post-op recover - NO history of  - Chronic pain requiring opiods  - Diabetes  - Coronary Artery Disease  - Heart failure  - Heart attack  - Stroke  - Cardiac arrhythmia  - Respiratory Failure/COPD  - Renal failure  - Anemia  - Advanced Liver  disease   Prudencio Burly III, PA-C 09/01/2019 10:37 AM

## 2019-09-02 ENCOUNTER — Encounter (HOSPITAL_COMMUNITY): Payer: Self-pay

## 2019-09-02 NOTE — Patient Instructions (Addendum)
DUE TO COVID-19 ONLY ONE VISITOR IS ALLOWED TO COME WITH YOU AND STAY IN THE WAITING ROOM ONLY DURING PRE OP AND PROCEDURE DAY OF SURGERY. THE 1 VISITOR MAY VISIT WITH YOU AFTER SURGERY IN YOUR PRIVATE ROOM DURING VISITING HOURS ONLY!  10 am -8pm  YOU NEED TO HAVE A COVID 19 TEST ON_4-2 -21______ @__3 :00_____, THIS TEST MUST BE DONE BEFORE SURGERY, COME   To Casey Castillo  ONCE YOUR COVID TEST IS COMPLETED, PLEASE BEGIN THE QUARANTINE INSTRUCTIONS AS OUTLINED IN YOUR HANDOUT.                Casey Castillo  09/02/2019   Your procedure is scheduled on: 09-15-19   Report to [facility] Main  Entrance   Report to admitting at      0730 AM     Call this number if you have problems the morning of surgery [phone]    Remember: NO SOLID FOOD AFTER MIDNIGHT THE NIGHT PRIOR TO SURGERY. NOTHING BY MOUTH EXCEPT CLEAR LIQUIDS UNTIL     0700 am  . PLEASE FINISH ENSURE DRINK PER SURGEON ORDER  WHICH NEEDS TO BE COMPLETED AT     0700 am then nothing by mouth .    CLEAR LIQUID DIET   Nothing by mouth after 0700 am    Foods Allowed                                                                                                          Foods Excluded  Coffee and tea, regular and decaf    No creamer                                                 liquids that you cannot  Plain Jell-O any favor except red or purple                                                          see through such as: Fruit ices (not with fruit pulp)                                                                             milk, soups, orange juice  Iced Popsicles  All solid food Carbonated beverages, regular and diet                                    Cranberry, grape and apple juices Sports drinks like Gatorade Lightly seasoned clear broth or consume(fat free) Sugar, honey  syrup  _____________________________________________________________________    BRUSH YOUR TEETH MORNING OF SURGERY AND RINSE YOUR MOUTH OUT, NO CHEWING GUM CANDY OR MINTS.     Take these medicines the morning of surgery with A SIP OF WATER: flomax, zocor, protonix, oxybutin                                 You may not have any metal on your body including hair pins and              piercings  Do not wear jewelry, , lotions, powders or perfumes, deodorant                        Men may shave face and neck.   Do not bring valuables to the hospital. [place].  Contacts, dentures or bridgework may not be worn into surgery.     Special Instructions: N/A              Please read over the following fact sheets you were given: _____________________________________________________________________           [facility] - Preparing for Surgery Before surgery, you can play an important role.  Because skin is not sterile, your skin needs to be as free of germs as possible.  You can reduce the number of germs on your skin by washing with CHG (chlorahexidine gluconate) soap before surgery.  CHG is an antiseptic cleaner which kills germs and bonds with the skin to continue killing germs even after washing. Please DO NOT use if you have an allergy to CHG or antibacterial soaps.  If your skin becomes reddened/irritated stop using the CHG and inform your nurse when you arrive at Short Stay. Do not shave (including legs and underarms) for at least 48 hours prior to the first CHG shower.  You may shave your face/neck. Please follow these instructions carefully:  1.  Shower with CHG Soap the night before surgery and the  morning of Surgery.  2.  If you choose to wash your hair, wash your hair first as usual with your  normal  shampoo.  3.  After you shampoo, rinse your hair and body thoroughly to remove the  shampoo.                           4.  Use CHG  as you would any other liquid soap.  You can apply chg directly  to the skin and wash                       Gently with a scrungie or clean washcloth.  5.  Apply the CHG Soap to your body ONLY FROM THE NECK DOWN.   Do not use on face/ open  Wound or open sores. Avoid contact with eyes, ears mouth and genitals (private parts).                       Wash face,  Genitals (private parts) with your normal soap.             6.  Wash thoroughly, paying special attention to the area where your surgery  will be performed.  7.  Thoroughly rinse your body with warm water from the neck down.  8.  DO NOT shower/wash with your normal soap after using and rinsing off  the CHG Soap.                9.  Pat yourself dry with a clean towel.            10.  Wear clean pajamas.            11.  Place clean sheets on your bed the night of your first shower and do not  sleep with pets. Day of Surgery : Do not apply any lotions/deodorants the morning of surgery.  Please wear clean clothes to the hospital/surgery center.  FAILURE TO FOLLOW THESE INSTRUCTIONS MAY RESULT IN THE CANCELLATION OF YOUR SURGERY PATIENT SIGNATURE_________________________________  NURSE SIGNATURE__________________________________  ________________________________________________________________________   Casey Castillo  An incentive spirometer is a tool that can help keep your lungs clear and active. This tool measures how well you are filling your lungs with each breath. Taking long deep breaths may help reverse or decrease the chance of developing breathing (pulmonary) problems (especially infection) following:  A long period of time when you are unable to move or be active. BEFORE THE PROCEDURE   If the spirometer includes an indicator to show your best effort, your nurse or respiratory therapist will set it to a desired goal.  If possible, sit up straight or lean slightly forward. Try not to  slouch.  Hold the incentive spirometer in an upright position. INSTRUCTIONS FOR USE  1. Sit on the edge of your bed if possible, or sit up as far as you can in bed or on a chair. 2. Hold the incentive spirometer in an upright position. 3. Breathe out normally. 4. Place the mouthpiece in your mouth and seal your lips tightly around it. 5. Breathe in slowly and as deeply as possible, raising the piston or the ball toward the top of the column. 6. Hold your breath for 3-5 seconds or for as long as possible. Allow the piston or ball to fall to the bottom of the column. 7. Remove the mouthpiece from your mouth and breathe out normally. 8. Rest for a few seconds and repeat Steps 1 through 7 at least 10 times every 1-2 hours when you are awake. Take your time and take a few normal breaths between deep breaths. 9. The spirometer may include an indicator to show your best effort. Use the indicator as a goal to work toward during each repetition. 10. After each set of 10 deep breaths, practice coughing to be sure your lungs are clear. If you have an incision (the cut made at the time of surgery), support your incision when coughing by placing a pillow or rolled up towels firmly against it. Once you are able to get out of bed, walk around indoors and cough well. You may stop using the incentive spirometer when instructed by your caregiver.  RISKS AND COMPLICATIONS  Take your time so you do not get  dizzy or light-headed.  If you are in pain, you may need to take or ask for pain medication before doing incentive spirometry. It is harder to take a deep breath if you are having pain. AFTER USE  Rest and breathe slowly and easily.  It can be helpful to keep track of a log of your progress. Your caregiver can provide you with a simple table to help with this. If you are using the spirometer at home, follow these instructions: [place] IF:   You are having difficultly using the spirometer.  You  have trouble using the spirometer as often as instructed.  Your pain medication is not giving enough relief while using the spirometer.  You develop fever of 100.5 F (38.1 C) or higher. SEEK IMMEDIATE MEDICAL CARE IF:   You cough up bloody sputum that had not been present before.  You develop fever of 102 F (38.9 C) or greater.  You develop worsening pain at or near the incision site. MAKE SURE YOU:   Understand these instructions.  Will watch your condition.  Will get help right away if you are not doing well or get worse. Document Released: 10/08/2006 Document Revised: 08/20/2011 Document Reviewed: 12/09/2006 [employer] Patient Information 2014 [place], [place].   ________________________________________________________________________

## 2019-09-04 ENCOUNTER — Encounter (HOSPITAL_COMMUNITY)
Admission: RE | Admit: 2019-09-04 | Discharge: 2019-09-04 | Disposition: A | Discharge status: Home / Self Care | Payer: PPO | Source: Ambulatory Visit | Attending: Orthopedic Surgery | Admitting: Orthopedic Surgery

## 2019-09-04 ENCOUNTER — Encounter (HOSPITAL_COMMUNITY): Payer: Self-pay

## 2019-09-04 ENCOUNTER — Other Ambulatory Visit: Payer: Self-pay

## 2019-09-04 DIAGNOSIS — Z01812 Encounter for preprocedural laboratory examination: Secondary | ICD-10-CM | POA: Diagnosis not present

## 2019-09-04 HISTORY — DX: Personal history of urinary calculi: Z87.442

## 2019-09-04 LAB — CBC
HCT: 42.8 % (ref 39.0–52.0)
Hemoglobin: 14.3 g/dL (ref 13.0–17.0)
MCH: 29 pg (ref 26.0–34.0)
MCHC: 33.4 g/dL (ref 30.0–36.0)
MCV: 86.8 fL (ref 80.0–100.0)
Platelets: 188 10*3/uL (ref 150–400)
RBC: 4.93 MIL/uL (ref 4.22–5.81)
RDW: 12.9 % (ref 11.5–15.5)
WBC: 8.6 10*3/uL (ref 4.0–10.5)
nRBC: 0 % (ref 0.0–0.2)

## 2019-09-04 LAB — BASIC METABOLIC PANEL
Anion gap: 8 (ref 5–15)
BUN: 18 mg/dL (ref 8–23)
CO2: 28 mmol/L (ref 22–32)
Calcium: 9 mg/dL (ref 8.9–10.3)
Chloride: 106 mmol/L (ref 98–111)
Creatinine, Ser: 1.08 mg/dL (ref 0.61–1.24)
GFR calc Af Amer: >60 mL/min (ref >60)
GFR calc non Af Amer: >60 mL/min (ref >60)
Glucose, Bld: 122 mg/dL — ABNORMAL HIGH (ref 70–99)
Potassium: 4.2 mmol/L (ref 3.5–5.1)
Sodium: 142 mmol/L (ref 135–145)

## 2019-09-04 LAB — SURGICAL PCR SCREEN
MRSA, PCR: NEGATIVE
Staphylococcus aureus: NEGATIVE

## 2019-09-04 NOTE — Progress Notes (Signed)
PCP - fusco, lawrence Cardiologist - Croitoru  Chest x-ray -  EKG -  Stress Test -  ECHO -  Cardiac Cath -   Sleep Study -  CPAP -   Fasting Blood Sugar -  Checks Blood Sugar _____ times a day  Blood Thinner Instructions: Aspirin Instructions: Last Dose:  Anesthesia review:   Patient denies shortness of breath, fever, cough and chest pain at PAT appointment   none   Patient verbalized understanding of instructions that were given to them at the PAT appointment. Patient was also instructed that they will need to review over the PAT instructions again at home before surgery.

## 2019-09-11 ENCOUNTER — Other Ambulatory Visit (HOSPITAL_COMMUNITY)
Admission: RE | Admit: 2019-09-11 | Discharge: 2019-09-11 | Disposition: A | Discharge status: Home / Self Care | Payer: PPO | Source: Ambulatory Visit | Attending: Orthopedic Surgery | Admitting: Orthopedic Surgery

## 2019-09-11 DIAGNOSIS — Z20822 Contact with and (suspected) exposure to covid-19: Secondary | ICD-10-CM | POA: Insufficient documentation

## 2019-09-11 DIAGNOSIS — Z01812 Encounter for preprocedural laboratory examination: Secondary | ICD-10-CM | POA: Insufficient documentation

## 2019-09-12 LAB — SARS CORONAVIRUS 2 (TAT 6-24 HRS): SARS Coronavirus 2: NEGATIVE

## 2019-09-14 MED ORDER — BUPIVACAINE LIPOSOME 1.3 % IJ SUSP
20.0000 mL | Freq: Once | INTRAMUSCULAR | Status: DC
  Filled 2019-09-14: qty 20

## 2019-09-15 ENCOUNTER — Encounter (HOSPITAL_COMMUNITY): Payer: Self-pay | Admitting: Orthopedic Surgery

## 2019-09-15 ENCOUNTER — Ambulatory Visit (HOSPITAL_COMMUNITY): Payer: PPO | Admitting: Certified Registered Nurse Anesthetist

## 2019-09-15 ENCOUNTER — Encounter (HOSPITAL_COMMUNITY)
Admission: RE | Disposition: A | Discharge status: Home / Self Care | Payer: Self-pay | Source: Home / Self Care | Attending: Orthopedic Surgery

## 2019-09-15 ENCOUNTER — Other Ambulatory Visit: Payer: Self-pay

## 2019-09-15 ENCOUNTER — Observation Stay (HOSPITAL_COMMUNITY)
Admission: RE | Admit: 2019-09-15 | Discharge: 2019-09-16 | Disposition: A | Discharge status: Home / Self Care | Payer: PPO | Attending: Orthopedic Surgery | Admitting: Orthopedic Surgery

## 2019-09-15 ENCOUNTER — Ambulatory Visit (HOSPITAL_COMMUNITY): Payer: PPO

## 2019-09-15 DIAGNOSIS — Z7901 Long term (current) use of anticoagulants: Secondary | ICD-10-CM | POA: Insufficient documentation

## 2019-09-15 DIAGNOSIS — Z96659 Presence of unspecified artificial knee joint: Secondary | ICD-10-CM

## 2019-09-15 DIAGNOSIS — F4310 Post-traumatic stress disorder, unspecified: Secondary | ICD-10-CM | POA: Diagnosis not present

## 2019-09-15 DIAGNOSIS — Z7982 Long term (current) use of aspirin: Secondary | ICD-10-CM | POA: Insufficient documentation

## 2019-09-15 DIAGNOSIS — I2510 Atherosclerotic heart disease of native coronary artery without angina pectoris: Secondary | ICD-10-CM | POA: Insufficient documentation

## 2019-09-15 DIAGNOSIS — Z791 Long term (current) use of non-steroidal anti-inflammatories (NSAID): Secondary | ICD-10-CM | POA: Diagnosis not present

## 2019-09-15 DIAGNOSIS — Z87442 Personal history of urinary calculi: Secondary | ICD-10-CM | POA: Diagnosis not present

## 2019-09-15 DIAGNOSIS — K219 Gastro-esophageal reflux disease without esophagitis: Secondary | ICD-10-CM | POA: Diagnosis not present

## 2019-09-15 DIAGNOSIS — Z86718 Personal history of other venous thrombosis and embolism: Secondary | ICD-10-CM | POA: Diagnosis not present

## 2019-09-15 DIAGNOSIS — Z981 Arthrodesis status: Secondary | ICD-10-CM | POA: Insufficient documentation

## 2019-09-15 DIAGNOSIS — M1712 Unilateral primary osteoarthritis, left knee: Principal | ICD-10-CM | POA: Insufficient documentation

## 2019-09-15 DIAGNOSIS — Z87891 Personal history of nicotine dependence: Secondary | ICD-10-CM | POA: Insufficient documentation

## 2019-09-15 DIAGNOSIS — Z91048 Other nonmedicinal substance allergy status: Secondary | ICD-10-CM | POA: Diagnosis not present

## 2019-09-15 DIAGNOSIS — F40240 Claustrophobia: Secondary | ICD-10-CM | POA: Diagnosis not present

## 2019-09-15 DIAGNOSIS — N400 Enlarged prostate without lower urinary tract symptoms: Secondary | ICD-10-CM | POA: Insufficient documentation

## 2019-09-15 DIAGNOSIS — Z86010 Personal history of colonic polyps: Secondary | ICD-10-CM | POA: Diagnosis not present

## 2019-09-15 DIAGNOSIS — K648 Other hemorrhoids: Secondary | ICD-10-CM | POA: Diagnosis present

## 2019-09-15 DIAGNOSIS — K649 Unspecified hemorrhoids: Secondary | ICD-10-CM | POA: Diagnosis present

## 2019-09-15 DIAGNOSIS — E785 Hyperlipidemia, unspecified: Secondary | ICD-10-CM | POA: Insufficient documentation

## 2019-09-15 DIAGNOSIS — E7800 Pure hypercholesterolemia, unspecified: Secondary | ICD-10-CM | POA: Diagnosis not present

## 2019-09-15 DIAGNOSIS — Z96652 Presence of left artificial knee joint: Secondary | ICD-10-CM | POA: Diagnosis not present

## 2019-09-15 DIAGNOSIS — Z79899 Other long term (current) drug therapy: Secondary | ICD-10-CM | POA: Diagnosis not present

## 2019-09-15 DIAGNOSIS — G8918 Other acute postprocedural pain: Secondary | ICD-10-CM | POA: Diagnosis not present

## 2019-09-15 DIAGNOSIS — Z471 Aftercare following joint replacement surgery: Secondary | ICD-10-CM | POA: Diagnosis not present

## 2019-09-15 HISTORY — PX: TOTAL KNEE ARTHROPLASTY: SHX125

## 2019-09-15 SURGERY — ARTHROPLASTY, KNEE, TOTAL
Anesthesia: Spinal | Site: Knee | Laterality: Left

## 2019-09-15 MED ORDER — ONDANSETRON HCL 4 MG PO TABS: 4.0000 mg | ORAL_TABLET | Freq: Three times a day (TID) | ORAL | 0 refills | Status: DC | PRN

## 2019-09-15 MED ORDER — FENTANYL CITRATE (PF) 100 MCG/2ML IJ SOLN
50.0000 ug | INTRAMUSCULAR | Status: DC
  Administered 2019-09-15: 09:00:00 100 ug via INTRAVENOUS
  Filled 2019-09-15: qty 2

## 2019-09-15 MED ORDER — POVIDONE-IODINE 10 % EX SWAB: 2.0000 "application " | Freq: Once | CUTANEOUS | Status: AC

## 2019-09-15 MED ORDER — GABAPENTIN 300 MG PO CAPS
300.0000 mg | ORAL_CAPSULE | Freq: Three times a day (TID) | ORAL | Status: DC
  Administered 2019-09-15 – 2019-09-16 (×3): 300 mg via ORAL
  Filled 2019-09-15 (×3): qty 1

## 2019-09-15 MED ORDER — TRANEXAMIC ACID-NACL 1000-0.7 MG/100ML-% IV SOLN
INTRAVENOUS | Status: AC
  Filled 2019-09-15: qty 100

## 2019-09-15 MED ORDER — CELECOXIB 200 MG PO CAPS
200.0000 mg | ORAL_CAPSULE | Freq: Two times a day (BID) | ORAL | Status: DC
  Administered 2019-09-15 – 2019-09-16 (×2): 200 mg via ORAL
  Filled 2019-09-15 (×2): qty 1

## 2019-09-15 MED ORDER — TRANEXAMIC ACID 1000 MG/10ML IV SOLN
2000.0000 mg | Freq: Once | INTRAVENOUS | Status: DC
  Filled 2019-09-15: qty 20

## 2019-09-15 MED ORDER — LACTATED RINGERS IV SOLN: INTRAVENOUS | Status: DC

## 2019-09-15 MED ORDER — EPHEDRINE SULFATE-NACL 50-0.9 MG/10ML-% IV SOSY
PREFILLED_SYRINGE | INTRAVENOUS | Status: DC | PRN
  Administered 2019-09-15 (×2): 10 mg via INTRAVENOUS

## 2019-09-15 MED ORDER — SORBITOL 70 % SOLN
30.0000 mL | Freq: Every day | Status: DC | PRN
  Filled 2019-09-15: qty 30

## 2019-09-15 MED ORDER — DEXAMETHASONE SODIUM PHOSPHATE 10 MG/ML IJ SOLN
10.0000 mg | Freq: Once | INTRAMUSCULAR | Status: AC
  Administered 2019-09-16: 06:00:00 10 mg via INTRAVENOUS
  Filled 2019-09-15: qty 1

## 2019-09-15 MED ORDER — ACETAMINOPHEN 325 MG PO TABS: 325.0000 mg | ORAL_TABLET | Freq: Four times a day (QID) | ORAL | Status: DC | PRN

## 2019-09-15 MED ORDER — PROPOFOL 500 MG/50ML IV EMUL
INTRAVENOUS | Status: DC | PRN
  Administered 2019-09-15: 50 ug/kg/min via INTRAVENOUS

## 2019-09-15 MED ORDER — SODIUM CHLORIDE 0.9% FLUSH
INTRAVENOUS | Status: DC | PRN
  Administered 2019-09-15: 30 mL via INTRAVENOUS

## 2019-09-15 MED ORDER — BACLOFEN 10 MG PO TABS: 10.0000 mg | ORAL_TABLET | Freq: Three times a day (TID) | ORAL | 0 refills | Status: DC | PRN

## 2019-09-15 MED ORDER — METOCLOPRAMIDE HCL 5 MG/ML IJ SOLN: 5.0000 mg | Freq: Three times a day (TID) | INTRAMUSCULAR | Status: DC | PRN

## 2019-09-15 MED ORDER — PHENOL 1.4 % MT LIQD: 1.0000 | OROMUCOSAL | Status: DC | PRN

## 2019-09-15 MED ORDER — DOCUSATE SODIUM 100 MG PO CAPS
100.0000 mg | ORAL_CAPSULE | Freq: Two times a day (BID) | ORAL | Status: DC
  Administered 2019-09-15 – 2019-09-16 (×2): 100 mg via ORAL
  Filled 2019-09-15 (×2): qty 1

## 2019-09-15 MED ORDER — PROPOFOL 1000 MG/100ML IV EMUL
INTRAVENOUS | Status: AC
  Filled 2019-09-15: qty 100

## 2019-09-15 MED ORDER — HYDROMORPHONE HCL 1 MG/ML IJ SOLN: 0.2500 mg | INTRAMUSCULAR | Status: DC | PRN

## 2019-09-15 MED ORDER — PANTOPRAZOLE SODIUM 40 MG PO TBEC
40.0000 mg | DELAYED_RELEASE_TABLET | Freq: Two times a day (BID) | ORAL | Status: DC
  Administered 2019-09-15 – 2019-09-16 (×3): 40 mg via ORAL
  Filled 2019-09-15 (×3): qty 1

## 2019-09-15 MED ORDER — METOCLOPRAMIDE HCL 5 MG PO TABS: 5.0000 mg | ORAL_TABLET | Freq: Three times a day (TID) | ORAL | Status: DC | PRN

## 2019-09-15 MED ORDER — BUPIVACAINE HCL (PF) 0.5 % IJ SOLN
INTRAMUSCULAR | Status: DC | PRN
  Administered 2019-09-15: 20 mL via PERINEURAL

## 2019-09-15 MED ORDER — ACETAMINOPHEN 500 MG PO TABS
1000.0000 mg | ORAL_TABLET | Freq: Once | ORAL | Status: AC
  Administered 2019-09-15: 1000 mg via ORAL
  Filled 2019-09-15: qty 2

## 2019-09-15 MED ORDER — METHOCARBAMOL 1000 MG/10ML IJ SOLN
500.0000 mg | Freq: Four times a day (QID) | INTRAVENOUS | Status: DC | PRN
  Filled 2019-09-15: qty 5

## 2019-09-15 MED ORDER — OXYCODONE HCL 5 MG PO TABS: 5.0000 mg | ORAL_TABLET | ORAL | 0 refills | Status: AC | PRN

## 2019-09-15 MED ORDER — APIXABAN 2.5 MG PO TABS
2.5000 mg | ORAL_TABLET | Freq: Two times a day (BID) | ORAL | Status: DC
  Administered 2019-09-16: 08:00:00 2.5 mg via ORAL
  Filled 2019-09-15: qty 1

## 2019-09-15 MED ORDER — MIDAZOLAM HCL 2 MG/2ML IJ SOLN
1.0000 mg | INTRAMUSCULAR | Status: DC
  Administered 2019-09-15: 09:00:00 1 mg via INTRAVENOUS
  Filled 2019-09-15: qty 2

## 2019-09-15 MED ORDER — CHLORHEXIDINE GLUCONATE 4 % EX LIQD: 60.0000 mL | Freq: Once | CUTANEOUS | Status: DC

## 2019-09-15 MED ORDER — PHENYLEPHRINE HCL (PRESSORS) 10 MG/ML IV SOLN
INTRAVENOUS | Status: AC
  Filled 2019-09-15: qty 2

## 2019-09-15 MED ORDER — BUPIVACAINE LIPOSOME 1.3 % IJ SUSP
INTRAMUSCULAR | Status: DC | PRN
  Administered 2019-09-15: 20 mL

## 2019-09-15 MED ORDER — POVIDONE-IODINE 10 % EX SWAB
2.0000 "application " | Freq: Once | CUTANEOUS | Status: AC
  Administered 2019-09-15: 2 via TOPICAL

## 2019-09-15 MED ORDER — 0.9 % SODIUM CHLORIDE (POUR BTL) OPTIME
TOPICAL | Status: DC | PRN
  Administered 2019-09-15: 1000 mL

## 2019-09-15 MED ORDER — SODIUM CHLORIDE (PF) 0.9 % IJ SOLN
INTRAMUSCULAR | Status: AC
  Filled 2019-09-15: qty 50

## 2019-09-15 MED ORDER — LORATADINE 10 MG PO TABS
10.0000 mg | ORAL_TABLET | Freq: Every day | ORAL | Status: DC
  Administered 2019-09-15: 20:00:00 10 mg via ORAL
  Filled 2019-09-15: qty 1

## 2019-09-15 MED ORDER — TAMSULOSIN HCL 0.4 MG PO CAPS
0.4000 mg | ORAL_CAPSULE | Freq: Two times a day (BID) | ORAL | Status: DC
  Administered 2019-09-15 – 2019-09-16 (×3): 0.4 mg via ORAL
  Filled 2019-09-15 (×3): qty 1

## 2019-09-15 MED ORDER — CEFAZOLIN SODIUM-DEXTROSE 1-4 GM/50ML-% IV SOLN
1.0000 g | Freq: Four times a day (QID) | INTRAVENOUS | Status: AC
  Administered 2019-09-15 (×2): 1 g via INTRAVENOUS
  Filled 2019-09-15 (×2): qty 50

## 2019-09-15 MED ORDER — HYDROCORTISONE 1 % EX OINT
TOPICAL_OINTMENT | Freq: Two times a day (BID) | CUTANEOUS | Status: DC
  Administered 2019-09-16: 1 via TOPICAL
  Filled 2019-09-15 (×2): qty 28.35

## 2019-09-15 MED ORDER — OXYCODONE HCL 5 MG PO TABS
5.0000 mg | ORAL_TABLET | ORAL | Status: DC | PRN
  Administered 2019-09-15: 20:00:00 10 mg via ORAL
  Administered 2019-09-16 (×2): 5 mg via ORAL
  Filled 2019-09-15 (×2): qty 1
  Filled 2019-09-15: qty 2

## 2019-09-15 MED ORDER — DEXAMETHASONE SODIUM PHOSPHATE 10 MG/ML IJ SOLN
INTRAMUSCULAR | Status: DC | PRN
  Administered 2019-09-15: 7 mg via INTRAVENOUS

## 2019-09-15 MED ORDER — ACETAMINOPHEN 500 MG PO TABS
1000.0000 mg | ORAL_TABLET | Freq: Three times a day (TID) | ORAL | Status: DC
  Administered 2019-09-15 – 2019-09-16 (×3): 1000 mg via ORAL
  Filled 2019-09-15 (×3): qty 2

## 2019-09-15 MED ORDER — METHOCARBAMOL 500 MG PO TABS
500.0000 mg | ORAL_TABLET | Freq: Four times a day (QID) | ORAL | Status: DC | PRN
  Administered 2019-09-15 – 2019-09-16 (×3): 500 mg via ORAL
  Filled 2019-09-15 (×3): qty 1

## 2019-09-15 MED ORDER — ONDANSETRON HCL 4 MG/2ML IJ SOLN: 4.0000 mg | Freq: Four times a day (QID) | INTRAMUSCULAR | Status: DC | PRN

## 2019-09-15 MED ORDER — CEFAZOLIN SODIUM-DEXTROSE 2-4 GM/100ML-% IV SOLN
2.0000 g | INTRAVENOUS | Status: AC
  Administered 2019-09-15: 2 g via INTRAVENOUS
  Filled 2019-09-15: qty 100

## 2019-09-15 MED ORDER — APIXABAN 2.5 MG PO TABS: 2.5000 mg | ORAL_TABLET | Freq: Two times a day (BID) | ORAL | 0 refills | Status: DC

## 2019-09-15 MED ORDER — ONDANSETRON HCL 4 MG PO TABS: 4.0000 mg | ORAL_TABLET | Freq: Four times a day (QID) | ORAL | Status: DC | PRN

## 2019-09-15 MED ORDER — STERILE WATER FOR IRRIGATION IR SOLN
Status: DC | PRN
  Administered 2019-09-15: 1000 mL

## 2019-09-15 MED ORDER — PROMETHAZINE HCL 25 MG/ML IJ SOLN: 6.2500 mg | INTRAMUSCULAR | Status: DC | PRN

## 2019-09-15 MED ORDER — HYDROMORPHONE HCL 1 MG/ML IJ SOLN: 0.5000 mg | INTRAMUSCULAR | Status: DC | PRN

## 2019-09-15 MED ORDER — POLYETHYLENE GLYCOL 3350 17 G PO PACK: 17.0000 g | PACK | Freq: Every day | ORAL | Status: DC | PRN

## 2019-09-15 MED ORDER — CELECOXIB 100 MG PO CAPS: 100.0000 mg | ORAL_CAPSULE | Freq: Two times a day (BID) | ORAL | 0 refills | Status: AC

## 2019-09-15 MED ORDER — PROPOFOL 10 MG/ML IV BOLUS
INTRAVENOUS | Status: DC | PRN
  Administered 2019-09-15 (×2): 20 mg via INTRAVENOUS
  Administered 2019-09-15: 30 mg via INTRAVENOUS

## 2019-09-15 MED ORDER — ONDANSETRON HCL 4 MG/2ML IJ SOLN
INTRAMUSCULAR | Status: DC | PRN
  Administered 2019-09-15: 4 mg via INTRAVENOUS

## 2019-09-15 MED ORDER — MAGNESIUM CITRATE PO SOLN: 1.0000 | Freq: Once | ORAL | Status: DC | PRN

## 2019-09-15 MED ORDER — EPHEDRINE 5 MG/ML INJ
INTRAVENOUS | Status: AC
  Filled 2019-09-15: qty 10

## 2019-09-15 MED ORDER — BUPIVACAINE IN DEXTROSE 0.75-8.25 % IT SOLN
INTRATHECAL | Status: DC | PRN
  Administered 2019-09-15: 1.6 mL via INTRATHECAL

## 2019-09-15 MED ORDER — SIMVASTATIN 40 MG PO TABS
40.0000 mg | ORAL_TABLET | Freq: Every day | ORAL | Status: DC
  Administered 2019-09-15 – 2019-09-16 (×2): 40 mg via ORAL
  Filled 2019-09-15 (×2): qty 1

## 2019-09-15 MED ORDER — DIPHENHYDRAMINE HCL 12.5 MG/5ML PO ELIX
12.5000 mg | ORAL_SOLUTION | ORAL | Status: DC | PRN
  Administered 2019-09-15: 19:00:00 25 mg via ORAL
  Administered 2019-09-16: 01:00:00 12.5 mg via ORAL
  Filled 2019-09-15: qty 5
  Filled 2019-09-15: qty 10

## 2019-09-15 MED ORDER — MELATONIN 5 MG PO TABS: 5.0000 mg | ORAL_TABLET | Freq: Every evening | ORAL | Status: DC | PRN

## 2019-09-15 MED ORDER — MENTHOL 3 MG MT LOZG: 1.0000 | LOZENGE | OROMUCOSAL | Status: DC | PRN

## 2019-09-15 SURGICAL SUPPLY — 49 items
BLADE HEX COATED 2.75 (ELECTRODE) ×2 IMPLANT
BLADE SAG 18X100X1.27 (BLADE) ×2 IMPLANT
BLADE SAGITTAL 25.0X1.37X90 (BLADE) ×2 IMPLANT
BLADE SURG 15 STRL LF DISP TIS (BLADE) ×1 IMPLANT
BLADE SURG 15 STRL SS (BLADE) ×2
BLADE SURG SZ10 CARB STEEL (BLADE) ×4 IMPLANT
BNDG CMPR MED 10X6 ELC LF (GAUZE/BANDAGES/DRESSINGS) ×1
BNDG ELASTIC 6X10 VLCR STRL LF (GAUZE/BANDAGES/DRESSINGS) ×2 IMPLANT
BOWL SMART MIX CTS (DISPOSABLE) IMPLANT
BSPLAT TIB 6 KN TRITANIUM (Knees) ×1 IMPLANT
CLSR STERI-STRIP ANTIMIC 1/2X4 (GAUZE/BANDAGES/DRESSINGS) ×2 IMPLANT
COVER SURGICAL LIGHT HANDLE (MISCELLANEOUS) ×2 IMPLANT
COVER WAND RF STERILE (DRAPES) IMPLANT
CUFF TOURN SGL QUICK 34 (TOURNIQUET CUFF) ×2
CUFF TRNQT CYL 34X4.125X (TOURNIQUET CUFF) ×1 IMPLANT
DECANTER SPIKE VIAL GLASS SM (MISCELLANEOUS) ×2 IMPLANT
DRAPE U-SHAPE 47X51 STRL (DRAPES) ×2 IMPLANT
DRSG MEPILEX BORDER 4X12 (GAUZE/BANDAGES/DRESSINGS) ×2 IMPLANT
DURAPREP 26ML APPLICATOR (WOUND CARE) ×4 IMPLANT
FEMORAL POSTERIOR SZ5 LFT (Femur) IMPLANT
GLOVE BIO SURGEON STRL SZ7.5 (GLOVE) ×4 IMPLANT
GLOVE BIOGEL PI IND STRL 8 (GLOVE) ×2 IMPLANT
GLOVE BIOGEL PI INDICATOR 8 (GLOVE) ×2
GOWN STRL REUS W/ TWL LRG LVL3 (GOWN DISPOSABLE) ×2 IMPLANT
GOWN STRL REUS W/TWL LRG LVL3 (GOWN DISPOSABLE) ×4
HANDPIECE INTERPULSE COAX TIP (DISPOSABLE) ×2
HOLDER FOLEY CATH W/STRAP (MISCELLANEOUS) IMPLANT
IMMOBILIZER KNEE 22 UNIV (SOFTGOODS) ×2 IMPLANT
INSERT TIB BEARING SZ6 9 (Miscellaneous) ×1 IMPLANT
KIT TURNOVER KIT A (KITS) IMPLANT
KNEE PATELLA ASYMMETRIC 10X35 (Knees) ×1 IMPLANT
KNEE TIBIAL COMPONENT SZ6 (Knees) ×1 IMPLANT
MANIFOLD NEPTUNE II (INSTRUMENTS) ×2 IMPLANT
NS IRRIG 1000ML POUR BTL (IV SOLUTION) ×2 IMPLANT
PACK ICE MAXI GEL EZY WRAP (MISCELLANEOUS) ×2 IMPLANT
PACK TOTAL KNEE CUSTOM (KITS) ×2 IMPLANT
PENCIL SMOKE EVACUATOR (MISCELLANEOUS) IMPLANT
PIN FLUTED HEDLESS FIX 3.5X1/8 (PIN) ×1 IMPLANT
POSTERIOR FEMORAL SZ5 LFT (Femur) ×2 IMPLANT
PROTECTOR NERVE ULNAR (MISCELLANEOUS) ×2 IMPLANT
SET HNDPC FAN SPRY TIP SCT (DISPOSABLE) ×1 IMPLANT
SUT MNCRL AB 4-0 PS2 18 (SUTURE) ×2 IMPLANT
SUT VIC AB 0 CT1 36 (SUTURE) ×2 IMPLANT
SUT VIC AB 1 CT1 36 (SUTURE) ×5 IMPLANT
SUT VIC AB 2-0 CT1 27 (SUTURE) ×2
SUT VIC AB 2-0 CT1 TAPERPNT 27 (SUTURE) ×1 IMPLANT
TRAY FOLEY MTR SLVR 14FR STAT (SET/KITS/TRAYS/PACK) IMPLANT
TRAY FOLEY MTR SLVR 16FR STAT (SET/KITS/TRAYS/PACK) IMPLANT
YANKAUER SUCT BULB TIP 10FT TU (MISCELLANEOUS) ×2 IMPLANT

## 2019-09-15 NOTE — Op Note (Signed)
DATE OF SURGERY:  09/15/2019 TIME: 11:27 AM  PATIENT NAME:  Casey Castillo   AGE: 74 y.o.    PRE-OPERATIVE DIAGNOSIS:  OSTEOARTHRITIS  LEFT KNEE  POST-OPERATIVE DIAGNOSIS:  Same  PROCEDURE:  Procedure(s): TOTAL KNEE ARTHROPLASTY   SURGEON:  Renette Butters, MD   ASSISTANT:  Roxan Hockey, PA-C, he was present and scrubbed throughout the case, critical for completion in a timely fashion, and for retraction, instrumentation, and closure.    OPERATIVE IMPLANTS: [employer] Triathlon Posterior Stabilized. Press fit knee  Femur size 5, Tibia size 6, Patella size 35 3-peg oval button, with a 9 mm polyethylene insert.   PREOPERATIVE INDICATIONS:  Casey Castillo is a 74 y.o. year old male with end stage bone on bone degenerative arthritis of the knee who failed conservative treatment, including injections, antiinflammatories, activity modification, and assistive devices, and had significant impairment of their activities of daily living, and elected for Total Knee Arthroplasty.   The risks, benefits, and alternatives were discussed at length including but not limited to the risks of infection, bleeding, nerve injury, stiffness, blood clots, the need for revision surgery, cardiopulmonary complications, among others, and they were willing to proceed.   OPERATIVE DESCRIPTION:  The patient was brought to the operative room and placed in a supine position.  General anesthesia was administered.  IV antibiotics were given.  The lower extremity was prepped and draped in the usual sterile fashion.  Time out was performed.  The leg was elevated and exsanguinated and the tourniquet was inflated.  Anterior approach was performed.  The patella was everted and osteophytes were removed.  The anterior horn of the medial and lateral meniscus was removed.   The distal femur was opened with the drill and the intramedullary distal femoral cutting jig was utilized, set at 5 degrees resecting 8 mm off the  distal femur.  Care was taken to protect the collateral ligaments.  The distal femoral sizing jig was applied, taking care to avoid notching.  Then the 4-in-1 cutting jig was applied and the anterior and posterior femur was cut, along with the chamfer cuts.  All posterior osteophytes were removed.  The flexion gap was then measured and was symmetric with the extension gap.  Then the extramedullary tibial cutting jig was utilized making the appropriate cut using the anterior tibial crest as a reference building in appropriate posterior slope.  Care was taken during the cut to protect the medial and collateral ligaments.  The proximal tibia was removed along with the posterior horns of the menisci.  The PCL was sacrificed.    The extensor gap was measured and was approximately 52mm.    I completed the distal femoral preparation using the appropriate jig to prepare the box.  The patella was then measured, and cut with the saw.    The proximal tibia sized and prepared accordingly with the reamer and the punch, and then all components were trialed with the above sized poly insert.  The knee was found to have excellent balance and full motion.    The above named components were then impacted into place and Poly tibial piece and patella were inserted.  I was very happy with his stability and ROM  I performed a periarticular injection with marcaine and toradol  The knee was easily taken through a range of motion and the patella tracked well and the knee irrigated copiously and the parapatellar and subcutaneous tissue closed with vicryl, and monocryl with steri strips for the skin.  The incision was dressed with sterile gauze and the tourniquet released and the patient was awakened and returned to the PACU in stable and satisfactory condition.  There were no complications.  Total tourniquet time was roughly 75 minutes.   POSTOPERATIVE PLAN: post op Abx, DVT px: SCD's, TED's, Early ambulation and  chemical px

## 2019-09-15 NOTE — Evaluation (Signed)
Physical Therapy Evaluation Patient Details Name: Casey Castillo MRN: [id number] DOB: [birth year] Today's Date: 09/15/2019   History of Present Illness  Patient is 74 y.o. 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     Clinical Impression  Casey Castillo is a 74 y.o. male POD 0 s/p LT TKA. Patient reports indpendence with mobility at baseline. Patient is now limited by functional impairments (see PT problem list below) and requires min assist for transfers and gait with RW. Patient was able to ambulate ~80 feet with RW and min assist. Patient instructed in exercise to facilitate ROM and circulation. Patient will benefit from continued skilled PT interventions to address impairments and progress towards PLOF. Acute PT will follow to progress mobility and stair training in preparation for safe discharge home.     Follow Up Recommendations Follow surgeon's recommendation for DC plan and follow-up therapies    Equipment Recommendations  None recommended by PT(pt  has DME)    Recommendations for Other Services       Precautions / Restrictions Precautions Precautions: Fall Restrictions Weight Bearing Restrictions: No Other Position/Activity Restrictions: WBAT      Mobility  Bed Mobility Overal bed mobility: Needs Assistance Bed Mobility: Supine to Sit     Supine to sit: Supervision;HOB elevated     General bed mobility comments: cues to use bed rail, no assist needed, some extra time needed.  Transfers Overall transfer level: Needs assistance Equipment used: Rolling walker (2 wheeled) Transfers: Sit to/from Stand Sit to Stand: Min assist         General transfer comment: cues for safe hand placement and safety to not move too quickly. min assist to steady with rising.   Ambulation/Gait Ambulation/Gait assistance: Min assist;Min guard Gait Distance (Feet): 80  Feet Assistive device: Rolling walker (2 wheeled) Gait Pattern/deviations: Step-to pattern;Step-through pattern;Decreased weight shift to left Gait velocity: fair   General Gait Details: cues for safe step pattern and safe proximity to RW. pt progressing gait to step through pattern throughout ambulation, no overt LOB noted. no buckling at Lt knee observed.  Stairs     Wheelchair Mobility    Modified Rankin (Stroke Patients Only)       Balance Overall balance assessment: Needs assistance Sitting-balance support: Feet supported Sitting balance-Leahy Scale: Good     Standing balance support: During functional activity;Bilateral upper extremity supported Standing balance-Leahy Scale: Fair              Pertinent Vitals/Pain Pain Assessment: 0-10 Pain Score: 3  Pain Location: Lt knee Pain Descriptors / Indicators: Aching;Discomfort Pain Intervention(s): Limited activity within patient's tolerance;Monitored during session;Repositioned    Home Living Family/patient expects to be discharged to:: Private residence Living Arrangements: Alone Available Help at Discharge: Family;Friend(s)(available for 2-4 days) Type of Home: House Home Access: Stairs to enter Entrance Stairs-Rails: None;Right(Rt rail at back) Technical brewer of Steps: 2+2 or 4 at the back Home Layout: One level Home Equipment: Environmental consultant - 2 wheels;Cane - quad;Grab bars - tub/shower;Bedside commode Additional Comments: pt's brother is staying with him his first night home and then his uncle. then his friend wanda will stay with him.     Prior Function Level of Independence: Independent               Hand Dominance   Dominant Hand: Right    Extremity/Trunk Assessment   Upper Extremity Assessment Upper  Extremity Assessment: Overall WFL for tasks assessed    Lower Extremity Assessment Lower Extremity Assessment: Overall WFL for tasks assessed;LLE deficits/detail LLE Deficits / Details: pt  with good quad activation, no extensor lag with SLR, 4/5 for strength with quad test. LLE Sensation: WNL LLE Coordination: WNL    Cervical / Trunk Assessment Cervical / Trunk Assessment: Normal  Communication   Communication: No difficulties  Cognition Arousal/Alertness: Awake/alert Behavior During Therapy: WFL for tasks assessed/performed Overall Cognitive Status: Within Functional Limits for tasks assessed           General Comments      Exercises Total Joint Exercises Ankle Circles/Pumps: AROM;Both;15 reps;Seated Quad Sets: AROM;Left;5 reps;Seated Heel Slides: AROM;Left;5 reps;Seated   Assessment/Plan    PT Assessment Patient needs continued PT services  PT Problem List Decreased strength;Decreased range of motion;Decreased activity tolerance;Decreased balance;Decreased mobility;Decreased knowledge of use of DME;Decreased knowledge of precautions       PT Treatment Interventions DME instruction;Gait training;Stair training;Functional mobility training;Therapeutic activities;Therapeutic exercise;Balance training;Patient/family education    PT Goals (Current goals can be found in the Care Plan section)  Acute Rehab PT Goals Patient Stated Goal: to get back to outdoor activity PT Goal Formulation: With patient Time For Goal Achievement: 09/22/19 Potential to Achieve Goals: Good    Frequency 7X/week    AM-PAC PT "6 Clicks" Mobility  Outcome Measure Help needed turning from your back to your side while in a flat bed without using bedrails?: None Help needed moving from lying on your back to sitting on the side of a flat bed without using bedrails?: None Help needed moving to and from a bed to a chair (including a wheelchair)?: A Little Help needed standing up from a chair using your arms (e.g., wheelchair or bedside chair)?: A Little Help needed to walk in hospital room?: A Little Help needed climbing 3-5 steps with a railing? : A Little 6 Click Score: 20    End  of Session Equipment Utilized During Treatment: Gait belt Activity Tolerance: Patient tolerated treatment well Patient left: in chair;with call bell/phone within reach;with chair alarm set;with family/visitor present Nurse Communication: Mobility status PT Visit Diagnosis: Muscle weakness (generalized) (M62.81);Difficulty in walking, not elsewhere classified (R26.2)    Time: [id number] PT Time Calculation (min) (ACUTE ONLY): 31 min   Charges:   PT Evaluation $PT Eval Low Complexity: 1 Low PT Treatments $Gait Training: 8-22 mins        Casey Castillo, DPT Physical Therapist with [facility] [phone]  09/15/2019 4:32 PM

## 2019-09-15 NOTE — Progress Notes (Signed)
Assisted Dr. Rose with left, ultrasound guided, adductor canal block. Side rails up, monitors on throughout procedure. See vital signs in flow sheet. Tolerated Procedure well.  

## 2019-09-15 NOTE — Progress Notes (Signed)
Orthopedic Tech Progress Note Patient Details:  Casey Castillo 11-27-1945 [id number]  CPM Left Knee CPM Left Knee: On Left Knee Flexion (Degrees): 90 Left Knee Extension (Degrees): 0  Post Interventions Patient Tolerated: Well Instructions Provided: Care of device Ortho Devices Type of Ortho Device: CPM padding Ortho Device/Splint Location: LLE Ortho Device/Splint Interventions: Ordered, Application   Post Interventions Patient Tolerated: Well Instructions Provided: Care of device   Staci Righter 09/15/2019, 1:39 PM

## 2019-09-15 NOTE — Transfer of Care (Signed)
Immediate Anesthesia Transfer of Care Note  Patient: Casey Castillo  Procedure(s) Performed: TOTAL KNEE ARTHROPLASTY (Left Knee)  Patient Location: PACU  Anesthesia Type:Spinal and MAC combined with regional for post-op pain  Level of Consciousness: awake, alert , oriented and patient cooperative  Airway & Oxygen Therapy: Patient Spontanous Breathing and Patient connected to face mask oxygen  Post-op Assessment: Report given to RN and Post -op Vital signs reviewed and stable  Post vital signs: Reviewed and stable  Last Vitals:  Vitals Value Taken Time  BP 123/70 09/15/19 1221  Temp    Pulse 69 09/15/19 1222  Resp 15 09/15/19 1222  SpO2 97 % 09/15/19 1222  Vitals shown include unvalidated device data.  Last Pain:  Vitals:   09/15/19 0753  TempSrc:   PainSc: 0-No pain      Patients Stated Pain Goal: 4 (83/35/82 5189)  Complications: No apparent anesthesia complications

## 2019-09-15 NOTE — Interval H&P Note (Signed)
I participated in the care of this patient and agree with the above history, physical and evaluation. I performed a review of the history and a physical exam as detailed   Arijana Narayan Daniel Irving Bloor MD  

## 2019-09-15 NOTE — Anesthesia Procedure Notes (Signed)
Anesthesia Regional Block: Adductor canal block   Pre-Anesthetic Checklist: ,, timeout performed, Correct Patient, Correct Site, Correct Laterality, Correct Procedure, Correct Position, site marked, Risks and benefits discussed,  Surgical consent,  Pre-op evaluation,  At surgeon's request and post-op pain management  Laterality: Left  Prep: chloraprep       Needles:  Injection technique: Single-shot  Needle Type: Echogenic Needle     Needle Length: 9cm      Additional Needles:   Procedures:,,,, ultrasound used (permanent image in chart),,,,  Narrative:  Start time: 09/15/2019 8:59 AM End time: 09/15/2019 9:06 AM Injection made incrementally with aspirations every 5 mL.  Performed by: Personally  Anesthesiologist: Myrtie Soman, MD  Additional Notes: Patient tolerated the procedure well without complications

## 2019-09-15 NOTE — Anesthesia Procedure Notes (Signed)
Spinal  Patient location during procedure: OR Start time: 09/15/2019 10:16 AM End time: 09/15/2019 10:19 AM Reason for block: at surgeon's request Staffing Performed: resident/CRNA  Anesthesiologist: Myrtie Soman, MD Resident/CRNA: West Pugh, CRNA Preanesthetic Checklist Completed: patient identified, IV checked, site marked, risks and benefits discussed, surgical consent, monitors and equipment checked, pre-op evaluation and timeout performed Spinal Block Patient position: sitting Prep: DuraPrep Patient monitoring: heart rate, continuous pulse ox and blood pressure Approach: midline Location: L3-4 Injection technique: single-shot Needle Needle type: Pencan  Needle gauge: 24 G Needle length: 9 cm Assessment Sensory level: T6 Additional Notes Expiration of kit checked and confirmed. Patient tolerated procedure well,without complications x 1 attempt with noted clear CSF. Loss of motor and sensory on exam post injection. Dr. Kalman Shan present for procedure.

## 2019-09-15 NOTE — Progress Notes (Signed)
cpm removed for pt

## 2019-09-15 NOTE — Anesthesia Postprocedure Evaluation (Signed)
Anesthesia Post Note  Patient: Casey Castillo  Procedure(s) Performed: TOTAL KNEE ARTHROPLASTY (Left Knee)     Patient location during evaluation: PACU Anesthesia Type: Spinal Level of consciousness: oriented and awake and alert Pain management: pain level controlled Vital Signs Assessment: post-procedure vital signs reviewed and stable Respiratory status: spontaneous breathing, respiratory function stable and patient connected to nasal cannula oxygen Cardiovascular status: blood pressure returned to baseline and stable Postop Assessment: no headache, no backache and no apparent nausea or vomiting Anesthetic complications: no    Last Vitals:  Vitals:   09/15/19 1330 09/15/19 1400  BP: 133/78 132/74  Pulse: (!) 53 66  Resp: 12 16  Temp: (!) 36.4 C 36.6 C  SpO2: 100% 100%    Last Pain:  Vitals:   09/15/19 1400  TempSrc: Oral  PainSc: 0-No pain                 Thetis Schwimmer S

## 2019-09-15 NOTE — Plan of Care (Signed)
  Problem: Activity: Goal: Ability to avoid complications of mobility impairment will improve Outcome: Progressing   Problem: Activity: Goal: Range of joint motion will improve Outcome: Progressing   Problem: Pain Management: Goal: Pain level will decrease with appropriate interventions Outcome: Progressing   Problem: Clinical Measurements: Goal: Respiratory complications will improve Outcome: Progressing   Problem: Clinical Measurements: Goal: Cardiovascular complication will be avoided Outcome: Progressing   Problem: Coping: Goal: Level of anxiety will decrease Outcome: Progressing   Problem: Pain Managment: Goal: General experience of comfort will improve Outcome: Progressing

## 2019-09-15 NOTE — Discharge Instructions (Addendum)
You may bear weight as tolerated. Keep your dressing on and dry until follow up. Take medicine to prevent blood clots as directed. Take pain medicine as needed with the goal of transitioning to over the counter medicines.  If needed, you may increase breakthrough pain medication (oxycodone) for the first few days post op - up to 2 tablets every 4 hours.  Stop this medication as soon as you are able.  INSTRUCTIONS AFTER JOINT REPLACEMENT   o Remove items at home which could result in a fall. This includes throw rugs or furniture in walking pathways o ICE to the affected joint every three hours while awake for 30 minutes at a time, for at least the first 3-5 days, and then as needed for pain and swelling.  Continue to use ice for pain and swelling. You may notice swelling that will progress down to the foot and ankle.  This is normal after surgery.  Elevate your leg when you are not up walking on it.   o Continue to use the breathing machine you got in the hospital (incentive spirometer) which will help keep your temperature down.  It is common for your temperature to cycle up and down following surgery, especially at night when you are not up moving around and exerting yourself.  The breathing machine keeps your lungs expanded and your temperature down.   DIET:  As you were doing prior to hospitalization, we recommend a well-balanced diet.  DRESSING / WOUND CARE / SHOWERING  You may shower 3 days after surgery, but keep the wounds dry during showering.  You may use an occlusive plastic wrap (Press'n Seal for example) with blue painter's tape at edges, NO SOAKING/SUBMERGING IN THE BATHTUB.  If the bandage gets wet, change with a clean dry gauze.  If the incision gets wet, pat the wound dry with a clean towel.  ACTIVITY  o Increase activity slowly as tolerated, but follow the weight bearing instructions below.   o No driving for 6 weeks or until further direction given by your physician.  You  cannot drive while taking narcotics.  o No lifting or carrying greater than 10 lbs. until further directed by your surgeon. o Avoid periods of inactivity such as sitting longer than an hour when not asleep. This helps prevent blood clots.  o You may return to work once you are authorized by your doctor.     WEIGHT BEARING   Weight bearing as tolerated with assist device (walker, cane, etc) as directed, use it as long as suggested by your surgeon or therapist, typically at least 4-6 weeks.   EXERCISES  Results after joint replacement surgery are often greatly improved when you follow the exercise, range of motion and muscle strengthening exercises prescribed by your doctor. Safety measures are also important to protect the joint from further injury. Any time any of these exercises cause you to have increased pain or swelling, decrease what you are doing until you are comfortable again and then slowly increase them. If you have problems or questions, call your caregiver or physical therapist for advice.   Rehabilitation is important following a joint replacement. After just a few days of immobilization, the muscles of the leg can become weakened and shrink (atrophy).  These exercises are designed to build up the tone and strength of the thigh and leg muscles and to improve motion. Often times heat used for twenty to thirty minutes before working out will loosen up your tissues and help with  improving the range of motion but do not use heat for the first two weeks following surgery (sometimes heat can increase post-operative swelling).   These exercises can be done on a training (exercise) mat, on the floor, on a table or on a bed. Use whatever works the best and is most comfortable for you.    Use music or television while you are exercising so that the exercises are a pleasant break in your day. This will make your life better with the exercises acting as a break in your routine that you can look  forward to.   Perform all exercises about fifteen times, three times per day or as directed.  You should exercise both the operative leg and the other leg as well.  Exercises include:    Quad Sets - Tighten up the muscle on the front of the thigh (Quad) and hold for 5-10 seconds.    Straight Leg Raises - With your knee straight (if you were given a brace, keep it on), lift the leg to 60 degrees, hold for 3 seconds, and slowly lower the leg.  Perform this exercise against resistance later as your leg gets stronger.   Leg Slides: Lying on your back, slowly slide your foot toward your buttocks, bending your knee up off the floor (only go as far as is comfortable). Then slowly slide your foot back down until your leg is flat on the floor again.   Angel Wings: Lying on your back spread your legs to the side as far apart as you can without causing discomfort.   Hamstring Strength:  Lying on your back, push your heel against the floor with your leg straight by tightening up the muscles of your buttocks.  Repeat, but this time bend your knee to a comfortable angle, and push your heel against the floor.  You may put a pillow under the heel to make it more comfortable if necessary.   A rehabilitation program following joint replacement surgery can speed recovery and prevent re-injury in the future due to weakened muscles. Contact your doctor or a physical therapist for more information on knee rehabilitation.    CONSTIPATION  Constipation is defined medically as fewer than three stools per week and severe constipation as less than one stool per week.  Even if you have a regular bowel pattern at home, your normal regimen is likely to be disrupted due to multiple reasons following surgery.  Combination of anesthesia, postoperative narcotics, change in appetite and fluid intake all can affect your bowels.   YOU MUST use at least one of the following options; they are listed in order of increasing strength  to get the job done.  They are all available over the counter, and you may need to use some, POSSIBLY even all of these options:    Drink plenty of fluids (prune juice may be helpful) and high fiber foods Colace 100 mg by mouth twice a day  Senokot for constipation as directed and as needed Dulcolax (bisacodyl), take with full glass of water  Miralax (polyethylene glycol) once or twice a day as needed.  If you have tried all these things and are unable to have a bowel movement in the first 3-4 days after surgery call either your surgeon or your primary doctor.    If you experience loose stools or diarrhea, hold the medications until you stool forms back up.  If your symptoms do not get better within 1 week or if they get worse,  check with your doctor.  If you experience "the worst abdominal pain ever" or develop nausea or vomiting, please contact the office immediately for further recommendations for treatment.   ITCHING:  If you experience itching with your medications, try taking only a single pain pill, or even half a pain pill at a time.  You can also use Benadryl over the counter for itching or also to help with sleep.   TED HOSE STOCKINGS:  Use stockings on both legs until for at least 2 weeks or as directed by physician office. They may be removed at night for sleeping.  MEDICATIONS:  See your medication summary on the After Visit Summary that nursing will review with you.  You may have some home medications which will be placed on hold until you complete the course of blood thinner medication.  It is important for you to complete the blood thinner medication as prescribed.  PRECAUTIONS:  If you experience chest pain or shortness of breath - call 911 immediately for transfer to the hospital emergency department.   If you develop a fever greater that 101 F, purulent drainage from wound, increased redness or drainage from wound, foul odor from the wound/dressing, or calf pain - CONTACT  YOUR SURGEON.                                                   FOLLOW-UP APPOINTMENTS:  If you do not already have a post-op appointment, please call the office for an appointment to be seen by your surgeon.  Guidelines for how soon to be seen are listed in your After Visit Summary, but are typically between 1-4 weeks after surgery.  OTHER INSTRUCTIONS:     MAKE SURE YOU:   Understand these instructions.   Get help right away if you are not doing well or get worse.    Thank you for letting us be a part of your medical care team.  It is a privilege we respect greatly.  We hope these instructions will help you stay on track for a fast and full recovery!    Information on my medicine - ELIQUIS (apixaban)  Why was Eliquis prescribed for you? Eliquis was prescribed for you to reduce the risk of blood clots forming after orthopedic surgery.    What do You need to know about Eliquis? Take your Eliquis TWICE DAILY - one tablet in the morning and one tablet in the evening with or without food.  It would be best to take the dose about the same time each day.  If you have difficulty swallowing the tablet whole please discuss with your pharmacist how to take the medication safely.  Take Eliquis exactly as prescribed by your doctor and DO NOT stop taking Eliquis without talking to the doctor who prescribed the medication.  Stopping without other medication to take the place of Eliquis may increase your risk of developing a clot.  After discharge, you should have regular check-up appointments with your healthcare provider that is prescribing your Eliquis.  What do you do if you miss a dose? If a dose of ELIQUIS is not taken at the scheduled time, take it as soon as possible on the same day and twice-daily administration should be resumed.  The dose should not be doubled to make up for a missed dose.  Do not take  more than one tablet of ELIQUIS at the same time.  Important Safety  Information A possible side effect of Eliquis is bleeding. You should call your healthcare provider right away if you experience any of the following: ? Bleeding from an injury or your nose that does not stop. ? Unusual colored urine (red or dark brown) or unusual colored stools (red or black). ? Unusual bruising for unknown reasons. ? A serious fall or if you hit your head (even if there is no bleeding).  Some medicines may interact with Eliquis and might increase your risk of bleeding or clotting while on Eliquis. To help avoid this, consult your healthcare provider or pharmacist prior to using any new prescription or non-prescription medications, including herbals, vitamins, non-steroidal anti-inflammatory drugs (NSAIDs) and supplements.  This website has more information on Eliquis (apixaban): http://www.eliquis.com/eliquis/home

## 2019-09-15 NOTE — Anesthesia Procedure Notes (Signed)
Procedure Name: MAC Date/Time: 09/15/2019 10:13 AM Performed by: West Pugh, CRNA Pre-anesthesia Checklist: Patient identified, Emergency Drugs available, Suction available, Patient being monitored and Timeout performed Patient Re-evaluated:Patient Re-evaluated prior to induction Oxygen Delivery Method: Simple face mask Preoxygenation: Pre-oxygenation with 100% oxygen Induction Type: IV induction Placement Confirmation: positive ETCO2 Dental Injury: Teeth and Oropharynx as per pre-operative assessment

## 2019-09-15 NOTE — Anesthesia Procedure Notes (Signed)
Anesthesia Procedure Image    

## 2019-09-15 NOTE — Anesthesia Preprocedure Evaluation (Signed)
Anesthesia Evaluation  Patient identified by MRN, date of birth, ID band Patient awake    Reviewed: Allergy & Precautions, NPO status , Patient's Chart, lab work & pertinent test results  Airway Mallampati: II  TM Distance: >3 FB Neck ROM: Limited    Dental no notable dental hx.    Pulmonary neg pulmonary ROS, former smoker,    Pulmonary exam normal breath sounds clear to auscultation       Cardiovascular negative cardio ROS Normal cardiovascular exam Rhythm:Regular Rate:Normal     Neuro/Psych negative neurological ROS  negative psych ROS   GI/Hepatic negative GI ROS, Neg liver ROS,   Endo/Other  negative endocrine ROS  Renal/GU negative Renal ROS  negative genitourinary   Musculoskeletal  (+) Arthritis , Osteoarthritis,    Abdominal   Peds negative pediatric ROS (+)  Hematology negative hematology ROS (+)   Anesthesia Other Findings   Reproductive/Obstetrics negative OB ROS                             Anesthesia Physical Anesthesia Plan  ASA: II  Anesthesia Plan: Spinal   Post-op Pain Management:  Regional for Post-op pain   Induction: Intravenous  PONV Risk Score and Plan: 2 and Ondansetron, Dexamethasone and Treatment may vary due to age or medical condition  Airway Management Planned: Simple Face Mask  Additional Equipment:   Intra-op Plan:   Post-operative Plan:   Informed Consent: I have reviewed the patients History and Physical, chart, labs and discussed the procedure including the risks, benefits and alternatives for the proposed anesthesia with the patient or authorized representative who has indicated his/her understanding and acceptance.     Dental advisory given  Plan Discussed with: CRNA and Surgeon  Anesthesia Plan Comments:         Anesthesia Quick Evaluation

## 2019-09-16 ENCOUNTER — Encounter: Payer: Self-pay | Admitting: *Deleted

## 2019-09-16 DIAGNOSIS — Z96652 Presence of left artificial knee joint: Secondary | ICD-10-CM | POA: Diagnosis not present

## 2019-09-16 DIAGNOSIS — M1712 Unilateral primary osteoarthritis, left knee: Secondary | ICD-10-CM | POA: Diagnosis not present

## 2019-09-16 NOTE — TOC Transition Note (Signed)
Transition of Care [facility]) - CM/SW Discharge Note   Patient Details  Name: Casey Castillo MRN: [id number] Date of Birth: 1945/08/28  [facility]) CM/SW Contact:  Lennart Pall, LCSW Phone Number: 09/16/2019, 9:58 AM   Clinical Narrative:  Reviewed with pt the DME and [place] arrangements already in place. Pt eager for d/c home today. No further needs indentified.     Final next level of care: Home w Home Health Services Barriers to Discharge: Barriers Resolved   Patient Goals and CMS Choice Patient states their goals for this hospitalization and ongoing recovery are:: to go home   Choice offered to / list presented to : Patient  Discharge Placement                       Discharge Plan and Services                DME Arranged: N/A(has all needed DME at home already)         [place] Arranged: PT [place] Agency: [facility] (now [facility] Date [place]: 09/16/19 Time [place]: [phone] Representative spoke with at [place]: alerted Ronalee Belts of pt d/c today  Social Determinants of Health (SDOH) Interventions     Readmission Risk Interventions No flowsheet data found.

## 2019-09-16 NOTE — Progress Notes (Signed)
    Subjective: Patient reports pain as mild.  Tolerating diet.  Urinating.  No CP, SOB.  Excellent early mobilization.  Objective:   VITALS:   Vitals:   09/15/19 1615 09/15/19 1924 09/16/19 0211 09/16/19 0536  BP: 122/84 139/69 111/65 119/65  Pulse: 89 (!) 102 (!) 57 (!) 53  Resp: 16 18 16 16   Temp: 97.7 F (36.5 C) 97.6 F (36.4 C) 98.1 F (36.7 C) 97.8 F (36.6 C)  TempSrc: Oral Oral Oral Oral  SpO2: 95%  93% 96%  Weight:      Height:       CBC Latest Ref Rng & Units 09/04/2019 12/19/2017 01/22/2012  WBC 4.0 - 10.5 K/uL 8.6 7.4 7.9  Hemoglobin 13.0 - 17.0 g/dL 14.3 14.9 14.9  Hematocrit 39.0 - 52.0 % 42.8 46.6 42.9  Platelets 150 - 400 K/uL 188 180 173   BMP Latest Ref Rng & Units 09/04/2019 06/12/2018 12/19/2017  Glucose 70 - 99 mg/dL 122(H) 110(H) 124(H)  BUN 8 - 23 mg/dL 18 13 10   Creatinine 0.61 - 1.24 mg/dL 1.08 1.10 1.14  Sodium 135 - 145 mmol/L 142 141 142  Potassium 3.5 - 5.1 mmol/L 4.2 3.9 4.2  Chloride 98 - 111 mmol/L 106 107 109  CO2 22 - 32 mmol/L 28 25 23   Calcium 8.9 - 10.3 mg/dL 9.0 8.6(L) 9.2   Intake/Output      04/06 0701 - 04/07 0700 04/07 0701 - 04/08 0700   P.O. 1140    I.V. (mL/kg) 3365.4 (33.3)    IV Piggyback 150    Total Intake(mL/kg) 4655.4 (46)    Urine (mL/kg/hr) 1875    Stool 0    Blood 25    Total Output 1900    Net +2755.4         Stool Occurrence 0 x       Physical Exam: General: NAD. Upright in bed.  Calm, conversant. Resp: No increased wob Cardio: regular rate and rhythm ABD soft Neurologically intact MSK Neurovascularly intact Sensation intact distally Intact pulses distally Dorsiflexion/Plantar flexion intact Incision: dressing C/D/I   Assessment: 1 Day Post-Op  S/P Procedure(s) (LRB): TOTAL KNEE ARTHROPLASTY (Left) by Dr. Ernesta Amble. Percell Miller on 09/15/19  Principal Problem:   Primary osteoarthritis of left knee Active Problems:   History of deep vein thrombosis (DVT) of lower extremity   Hypercholesterolemia  Hemorrhoids   PTSD (post-traumatic stress disorder)   GERD (gastroesophageal reflux disease)   Primary osteoarthritis, status post Left Total knee arthroplasty Doing well postop day 1 Tolerating diet and voiding Pain controlled Excellent early mobility.  Plan: Up with therapy D/C IV fluids Incentive Spirometry Elevate and Apply ice CPM, bone foam  Weight Bearing: Weight Bearing as Tolerated (WBAT) LLE Dressings: Maintain Mepilex.   VTE prophylaxis: Eliquis, SCDs, ambulation Dispo: Home today after a.m. therapy.    Patient's anticipated LOS is less than 2 midnights, meeting these requirements:  - Lives within 1 hour of care - Has a competent adult at home to recover with post-op recover - NO history of  - Chronic pain requiring opiods  - Diabetes  - Heart failure  - Heart attack  - Stroke  - Cardiac arrhythmia  - Respiratory Failure/COPD  - Renal failure  - Anemia  - Advanced Liver disease    Prudencio Burly III, PA-C 09/16/2019, 7:49 AM

## 2019-09-16 NOTE — Discharge Summary (Signed)
Discharge Summary  Patient ID: Casey Castillo MRN: [id number] DOB/AGE: August 06, 1945 74 y.o.  Admit date: 09/15/2019 Discharge date: 09/16/2019  Admission Diagnoses:  Primary osteoarthritis of left knee  Discharge Diagnoses:  Principal Problem:   Primary osteoarthritis of left knee Active Problems:   History of deep vein thrombosis (DVT) of lower extremity   Hypercholesterolemia   Hemorrhoids   PTSD (post-traumatic stress disorder)   GERD (gastroesophageal reflux disease)   Past Medical History:  Diagnosis Date  . Arthritis   . Cervical myelopathy with cervical radiculopathy   . Claustrophobia   . Coronary artery disease    5%blockage on left anterior descending Dr. Sallyanne Kuster   . Diverticulitis    hx of  . Enlarged prostate    takes flomax  . GERD (gastroesophageal reflux disease)   . History of blood clots    november, 5 1995- left leg; 2014 - left calf  . History of kidney stones   . Hyperlipidemia    primary Dr. Selena Batten  . Pneumonia    as a child  75 YEARS OLD  . PTSD (post-traumatic stress disorder)   . PTSD (post-traumatic stress disorder)   . Seasonal allergies   . Urinary frequency    hx of     Surgeries: Procedure(s): TOTAL KNEE ARTHROPLASTY on 09/15/2019   Consultants (if any):   Discharged Condition: Improved  Hospital Course: Casey Castillo is an 74 y.o. male who was admitted 09/15/2019 with a diagnosis of Primary osteoarthritis of left knee and went to the operating room on 09/15/2019 and underwent the above named procedures.     He was given perioperative antibiotics:  Anti-infectives (From admission, onward)   Start     Dose/Rate Route Frequency Ordered Stop   09/15/19 1600  ceFAZolin (ANCEF) IVPB 1 g/50 mL premix     1 g 100 mL/hr over 30 Minutes Intravenous Every 6 hours 09/15/19 1351 09/15/19 2056   09/15/19 0745  ceFAZolin (ANCEF) IVPB 2g/100 mL premix     2 g 200 mL/hr over 30 Minutes Intravenous On call to O.R. 09/15/19 [id number] 09/15/19 1050     .  He was given sequential compression devices, early ambulation, and Eliquis for DVT prophylaxis.  He benefited maximally from the hospital stay and there were no complications.    Recent vital signs:  Vitals:   09/16/19 0211 09/16/19 0536  BP: 111/65 119/65  Pulse: (!) 57 (!) 53  Resp: 16 16  Temp: 98.1 F (36.7 C) 97.8 F (36.6 C)  SpO2: 93% 96%    Recent laboratory studies:  Lab Results  Component Value Date   HGB 14.3 09/04/2019   HGB 14.9 12/19/2017   HGB 14.9 01/22/2012   Lab Results  Component Value Date   WBC 8.6 09/04/2019   PLT 188 09/04/2019   Lab Results  Component Value Date   INR 3.4 12/31/2013   Lab Results  Component Value Date   Casey Castillo 142 09/04/2019   K 4.2 09/04/2019   CL 106 09/04/2019   CO2 28 09/04/2019   BUN 18 09/04/2019   CREATININE 1.08 09/04/2019   GLUCOSE 122 (H) 09/04/2019    Discharge Medications:   Allergies as of 09/16/2019      Reactions   Adhesive [tape] Hives, Rash, Other (See Comments)   EKG electrodes cause whelps.  Severe PLEASE WIPE OFF WITH ALCOHOL SWAB.      Medication List    STOP taking these medications   aspirin EC 81 MG tablet  TAKE these medications   acetaminophen 650 MG CR tablet Commonly known as: TYLENOL Take 650-1,300 mg by mouth every 8 (eight) hours as needed for pain.   apixaban 2.5 MG Tabs tablet Commonly known as: ELIQUIS Take 1 tablet (2.5 mg total) by mouth 2 (two) times daily. For postoperative DVT prophylaxis   AQUAPHOR EX Apply 1 application topically 3 (three) times daily as needed (severe dry skin.).   baclofen 10 MG tablet Commonly known as: LIORESAL Take 1 tablet (10 mg total) by mouth 3 (three) times daily as needed for muscle spasms.   Biotin 5000 MCG Tabs Take 5,000 mcg by mouth daily.   celecoxib 100 MG capsule Commonly known as: CeleBREX Take 1 capsule (100 mg total) by mouth 2 (two) times daily for 14 days.   clobetasol cream 0.05 % Commonly known as:  TEMOVATE Apply 1 application topically 2 (two) times daily as needed (dry skin).   diphenhydrAMINE 50 MG tablet Commonly known as: BENADRYL Take 50 mg by mouth at bedtime as needed for sleep (sleep.).   Fish Oil 1200 MG Caps Take 1,200 mg by mouth at bedtime.   loratadine 10 MG tablet Commonly known as: CLARITIN Take 10 mg by mouth at bedtime.   Lysine HCl 500 MG Tabs Take 500 mg by mouth at bedtime.   melatonin 5 MG Tabs Take 5 mg by mouth at bedtime as needed (sleep).   ondansetron 4 MG tablet Commonly known as: Zofran Take 1 tablet (4 mg total) by mouth every 8 (eight) hours as needed for nausea or vomiting.   oxybutynin 5 MG tablet Commonly known as: DITROPAN Take 1 tablet (5 mg total) by mouth daily. What changed: how much to take   oxyCODONE 5 MG immediate release tablet Commonly known as: Roxicodone Take 1 tablet (5 mg total) by mouth every 4 (four) hours as needed for up to 7 days for breakthrough pain.   pantoprazole 40 MG tablet Commonly known as: PROTONIX Take 40 mg by mouth 2 (two) times daily.   simvastatin 40 MG tablet Commonly known as: ZOCOR Take 1 tablet (40 mg total) by mouth daily.   tadalafil 5 MG tablet Commonly known as: CIALIS Take 5 mg by mouth every evening.   tamsulosin 0.4 MG Caps capsule Commonly known as: FLOMAX Take 0.4 mg by mouth 2 (two) times daily.   Tums Ultra 1000 400 MG chewable tablet Generic drug: calcium elemental as carbonate Chew 2,000-3,000 mg by mouth daily as needed for heartburn.   valACYclovir 1000 MG tablet Commonly known as: VALTREX Take 500 mg by mouth daily as needed (fever blisters.).   Vitamin D 50 MCG (2000 UT) Caps Take 2,000 Units by mouth at bedtime.       Diagnostic Studies: DG Knee Left Port  Result Date: 09/15/2019 CLINICAL DATA:  Left knee replacement. EXAM: PORTABLE LEFT KNEE - 1-2 VIEW COMPARISON:  No prior. FINDINGS: Total left knee replacement. Hardware intact. Anatomic alignment. No acute  bony abnormality identified. IMPRESSION: Total left knee replacement with anatomic alignment. Electronically Signed   By: Marcello Moores  Register   On: 09/15/2019 12:55    Disposition: Discharge disposition: 01-Home or Self Care       Discharge Instructions    Discharge patient   Complete by: As directed    After A.M. therapy.   Discharge disposition: 01-Home or Self Care   Discharge patient date: 09/16/2019      Follow-up Information    Renette Butters, MD.   Specialty: Orthopedic  Surgery Contact information: [address] [place] [address] [phone]            Signed: Prudencio Burly III PA-C 09/16/2019, 7:53 AM

## 2019-09-16 NOTE — Plan of Care (Signed)
Patient discharged home in stable condition, waiting on his ride 

## 2019-09-16 NOTE — Progress Notes (Signed)
Physical Therapy Treatment Patient Details Name: Casey Castillo MRN: [id number] DOB: 1945-10-23 Today's Date: 09/16/2019    History of Present Illness Patient is 74 y.o. 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     PT Comments    Progressing well. Reviewed/practiced exercises, gait training, and stair training. All education completed. Okay to d/c from PT standpoint.    Follow Up Recommendations  Follow surgeon's recommendation for DC plan and follow-up therapies     Equipment Recommendations  None recommended by PT    Recommendations for Other Services       Precautions / Restrictions Precautions Precautions: Fall Restrictions Weight Bearing Restrictions: No Other Position/Activity Restrictions: WBAT    Mobility  Bed Mobility               General bed mobility comments: oob in recliner  Transfers Overall transfer level: Needs assistance Equipment used: Rolling walker (2 wheeled) Transfers: Sit to/from Stand Sit to Stand: Supervision         General transfer comment: for safety.  Ambulation/Gait Ambulation/Gait assistance: Supervision Gait Distance (Feet): 200 Feet Assistive device: Rolling walker (2 wheeled) Gait Pattern/deviations: Step-through pattern;Decreased stride length     General Gait Details: for safety   Stairs Stairs: Yes Stairs assistance: Min guard Stair Management: Step to pattern;Forwards;One rail Right Number of Stairs: 2 General stair comments: close guard for safety.  vcs safety, sequence, technique.   Wheelchair Mobility    Modified Rankin (Stroke Patients Only)       Balance Overall balance assessment: Mild deficits observed, not formally tested                                          Cognition Arousal/Alertness: Awake/alert Behavior During Therapy: WFL for tasks  assessed/performed Overall Cognitive Status: Within Functional Limits for tasks assessed                                        Exercises Total Joint Exercises Ankle Circles/Pumps: AROM;15 reps Straight Leg Raises: AROM;Left;10 reps Long Arc Quad: AROM;Left;10 reps Knee Flexion: AROM;Left;10 reps;Seated Goniometric ROM: ~5-105 degrees    General Comments        Pertinent Vitals/Pain Pain Assessment: 0-10 Pain Score: 5  Pain Location: L knee with exercises Pain Descriptors / Indicators: Discomfort;Sore Pain Intervention(s): Monitored during session;Ice applied;Repositioned    Home Living                      Prior Function            PT Goals (current goals can now be found in the care plan section) Progress towards PT goals: Progressing toward goals    Frequency    7X/week      PT Plan Current plan remains appropriate    Co-evaluation              AM-PAC PT "6 Clicks" Mobility   Outcome Measure  Help needed turning from your back to your side while in a flat bed without using bedrails?: None Help needed moving from lying on your back to sitting on the side of a flat bed without using bedrails?: None Help needed moving to  and from a bed to a chair (including a wheelchair)?: A Little Help needed standing up from a chair using your arms (e.g., wheelchair or bedside chair)?: A Little Help needed to walk in hospital room?: A Little Help needed climbing 3-5 steps with a railing? : A Little 6 Click Score: 20    End of Session Equipment Utilized During Treatment: Gait belt Activity Tolerance: Patient tolerated treatment well Patient left: in chair;with call bell/phone within reach;with chair alarm set   PT Visit Diagnosis: Other abnormalities of gait and mobility (R26.89)     Time: [id number] PT Time Calculation (min) (ACUTE ONLY): 28 min  Charges:  $Gait Training: 8-22 mins $Therapeutic Exercise: 8-22 mins                          Doreatha Massed, PT Acute Rehabilitation

## 2019-09-18 DIAGNOSIS — N39498 Other specified urinary incontinence: Secondary | ICD-10-CM | POA: Diagnosis not present

## 2019-09-18 DIAGNOSIS — I2510 Atherosclerotic heart disease of native coronary artery without angina pectoris: Secondary | ICD-10-CM | POA: Diagnosis not present

## 2019-09-18 DIAGNOSIS — M4712 Other spondylosis with myelopathy, cervical region: Secondary | ICD-10-CM | POA: Diagnosis not present

## 2019-09-18 DIAGNOSIS — F4310 Post-traumatic stress disorder, unspecified: Secondary | ICD-10-CM | POA: Diagnosis not present

## 2019-09-18 DIAGNOSIS — Z7901 Long term (current) use of anticoagulants: Secondary | ICD-10-CM | POA: Diagnosis not present

## 2019-09-18 DIAGNOSIS — Z471 Aftercare following joint replacement surgery: Secondary | ICD-10-CM | POA: Diagnosis not present

## 2019-09-18 DIAGNOSIS — Z86010 Personal history of colonic polyps: Secondary | ICD-10-CM | POA: Diagnosis not present

## 2019-09-18 DIAGNOSIS — R350 Frequency of micturition: Secondary | ICD-10-CM | POA: Diagnosis not present

## 2019-09-18 DIAGNOSIS — F40240 Claustrophobia: Secondary | ICD-10-CM | POA: Diagnosis not present

## 2019-09-18 DIAGNOSIS — Z86718 Personal history of other venous thrombosis and embolism: Secondary | ICD-10-CM | POA: Diagnosis not present

## 2019-09-18 DIAGNOSIS — K219 Gastro-esophageal reflux disease without esophagitis: Secondary | ICD-10-CM | POA: Diagnosis not present

## 2019-09-18 DIAGNOSIS — Z87891 Personal history of nicotine dependence: Secondary | ICD-10-CM | POA: Diagnosis not present

## 2019-09-18 DIAGNOSIS — R3915 Urgency of urination: Secondary | ICD-10-CM | POA: Diagnosis not present

## 2019-09-18 DIAGNOSIS — E669 Obesity, unspecified: Secondary | ICD-10-CM | POA: Diagnosis not present

## 2019-09-18 DIAGNOSIS — E785 Hyperlipidemia, unspecified: Secondary | ICD-10-CM | POA: Diagnosis not present

## 2019-09-18 DIAGNOSIS — M4722 Other spondylosis with radiculopathy, cervical region: Secondary | ICD-10-CM | POA: Diagnosis not present

## 2019-09-18 DIAGNOSIS — J302 Other seasonal allergic rhinitis: Secondary | ICD-10-CM | POA: Diagnosis not present

## 2019-09-18 DIAGNOSIS — Z6832 Body mass index (BMI) 32.0-32.9, adult: Secondary | ICD-10-CM | POA: Diagnosis not present

## 2019-09-18 DIAGNOSIS — K641 Second degree hemorrhoids: Secondary | ICD-10-CM | POA: Diagnosis not present

## 2019-09-18 DIAGNOSIS — N401 Enlarged prostate with lower urinary tract symptoms: Secondary | ICD-10-CM | POA: Diagnosis not present

## 2019-09-18 DIAGNOSIS — Z96652 Presence of left artificial knee joint: Secondary | ICD-10-CM | POA: Diagnosis not present

## 2019-09-25 DIAGNOSIS — Z86718 Personal history of other venous thrombosis and embolism: Secondary | ICD-10-CM | POA: Diagnosis not present

## 2019-09-25 DIAGNOSIS — Z96652 Presence of left artificial knee joint: Secondary | ICD-10-CM | POA: Diagnosis not present

## 2019-09-25 DIAGNOSIS — M4722 Other spondylosis with radiculopathy, cervical region: Secondary | ICD-10-CM | POA: Diagnosis not present

## 2019-09-25 DIAGNOSIS — N401 Enlarged prostate with lower urinary tract symptoms: Secondary | ICD-10-CM | POA: Diagnosis not present

## 2019-09-25 DIAGNOSIS — Z86010 Personal history of colonic polyps: Secondary | ICD-10-CM | POA: Diagnosis not present

## 2019-09-25 DIAGNOSIS — N39498 Other specified urinary incontinence: Secondary | ICD-10-CM | POA: Diagnosis not present

## 2019-09-25 DIAGNOSIS — Z471 Aftercare following joint replacement surgery: Secondary | ICD-10-CM | POA: Diagnosis not present

## 2019-09-25 DIAGNOSIS — F40240 Claustrophobia: Secondary | ICD-10-CM | POA: Diagnosis not present

## 2019-09-25 DIAGNOSIS — Z87891 Personal history of nicotine dependence: Secondary | ICD-10-CM | POA: Diagnosis not present

## 2019-09-25 DIAGNOSIS — K641 Second degree hemorrhoids: Secondary | ICD-10-CM | POA: Diagnosis not present

## 2019-09-25 DIAGNOSIS — R350 Frequency of micturition: Secondary | ICD-10-CM | POA: Diagnosis not present

## 2019-09-25 DIAGNOSIS — M4712 Other spondylosis with myelopathy, cervical region: Secondary | ICD-10-CM | POA: Diagnosis not present

## 2019-09-25 DIAGNOSIS — Z7901 Long term (current) use of anticoagulants: Secondary | ICD-10-CM | POA: Diagnosis not present

## 2019-09-25 DIAGNOSIS — E785 Hyperlipidemia, unspecified: Secondary | ICD-10-CM | POA: Diagnosis not present

## 2019-09-25 DIAGNOSIS — R3915 Urgency of urination: Secondary | ICD-10-CM | POA: Diagnosis not present

## 2019-09-25 DIAGNOSIS — E669 Obesity, unspecified: Secondary | ICD-10-CM | POA: Diagnosis not present

## 2019-09-25 DIAGNOSIS — I2510 Atherosclerotic heart disease of native coronary artery without angina pectoris: Secondary | ICD-10-CM | POA: Diagnosis not present

## 2019-09-25 DIAGNOSIS — K219 Gastro-esophageal reflux disease without esophagitis: Secondary | ICD-10-CM | POA: Diagnosis not present

## 2019-09-25 DIAGNOSIS — Z6832 Body mass index (BMI) 32.0-32.9, adult: Secondary | ICD-10-CM | POA: Diagnosis not present

## 2019-09-25 DIAGNOSIS — J302 Other seasonal allergic rhinitis: Secondary | ICD-10-CM | POA: Diagnosis not present

## 2019-09-25 DIAGNOSIS — F4310 Post-traumatic stress disorder, unspecified: Secondary | ICD-10-CM | POA: Diagnosis not present

## 2019-09-30 DIAGNOSIS — M1712 Unilateral primary osteoarthritis, left knee: Secondary | ICD-10-CM | POA: Diagnosis not present

## 2019-10-05 ENCOUNTER — Ambulatory Visit (HOSPITAL_COMMUNITY): Payer: PPO | Attending: Orthopedic Surgery | Admitting: Physical Therapy

## 2019-10-05 ENCOUNTER — Other Ambulatory Visit: Payer: Self-pay

## 2019-10-05 ENCOUNTER — Encounter (HOSPITAL_COMMUNITY): Payer: Self-pay | Admitting: Physical Therapy

## 2019-10-05 DIAGNOSIS — R2689 Other abnormalities of gait and mobility: Secondary | ICD-10-CM | POA: Diagnosis not present

## 2019-10-05 DIAGNOSIS — R29898 Other symptoms and signs involving the musculoskeletal system: Secondary | ICD-10-CM | POA: Insufficient documentation

## 2019-10-05 DIAGNOSIS — M25562 Pain in left knee: Secondary | ICD-10-CM | POA: Insufficient documentation

## 2019-10-05 DIAGNOSIS — M1712 Unilateral primary osteoarthritis, left knee: Secondary | ICD-10-CM | POA: Diagnosis not present

## 2019-10-05 NOTE — Therapy (Signed)
[place], [place], [address] Phone: [phone]   Fax:  [phone]  Physical Therapy Evaluation  Patient Details  Name: Casey Castillo MRN: [id number] Date of Birth: 12-Feb-1946 Referring Provider (PT): Edmonia Lynch MD    Encounter Date: 10/05/2019  PT End of Session - 10/05/19 1727    Visit Number  1    Number of Visits  12    Date for PT Re-Evaluation  11/20/19    Authorization Type  [employer]; no auth req, no V.L.    Progress Note Due on Visit  10    PT Start Time  1645    PT Stop Time  1730    PT Time Calculation (min)  45 min    Activity Tolerance  Patient tolerated treatment well    Behavior During Therapy  WFL for tasks assessed/performed       Past Medical History:  Diagnosis Date  . Arthritis   . Cervical myelopathy with cervical radiculopathy   . Claustrophobia   . Coronary artery disease    5%blockage on left anterior descending Dr. Sallyanne Kuster   . Diverticulitis    hx of  . Enlarged prostate    takes flomax  . GERD (gastroesophageal reflux disease)   . History of blood clots    november, 5 1995- left leg; 2014 - left calf  . History of kidney stones   . Hyperlipidemia    primary Dr. Selena Batten  . Pneumonia    as a child  74 YEARS OLD  . PTSD (post-traumatic stress disorder)   . PTSD (post-traumatic stress disorder)   . Seasonal allergies   . Urinary frequency    hx of     Past Surgical History:  Procedure Laterality Date  . ANTERIOR CERVICAL DECOMP/DISCECTOMY FUSION  01/28/2012   Procedure: ANTERIOR CERVICAL DECOMPRESSION/DISCECTOMY FUSION 2 LEVELS;  Surgeon: Kristeen Miss, MD;  Location: [facility];  Service: Neurosurgery;  Laterality: N/A;  Cervical six-seven, cervical seven-thoracic one Anterior cervical decompression/diskectomy/fusion  . ANTERIOR CERVICAL DECOMP/DISCECTOMY FUSION N/A 12/24/2017   Procedure: ANTERIOR CERVICAL DECOMPRESSION/DISCECTOMY FUSION CERVICAL  FOUR-FIVE/CERVICAL FIVE-SIX TWO LEVELS;  Surgeon: Kristeen Miss, MD;  Location: [facility];  Service: Neurosurgery;  Laterality: N/A;  ANTERIOR CERVICAL DECOMPRESSION/DISCECTOMY FUSION CERVICAL FOUR-FIVE/CERVICAL FIVE-SIX TWO LEVELS  . CARDIAC CATHETERIZATION     2006  . CARDIOVASCULAR STRESS TEST     2006 and 2008 at [facility] heart and vascular, 2011 myoview and echo  . COLONOSCOPY    . COLONOSCOPY N/A 08/30/2015   Dr. Arnoldo Morale: one 20 mm polyp in proximal ascending colon, removed piecemeal using hot snare. Resected and retrieved. Diverticulosis in sigmoid colon. Path with tubular adenoma, no high grade dysplasia. 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 COLONOSCOPY N/A 08/26/2019   Procedure: COLONOSCOPY;  Surgeon: Daneil Dolin, MD;  [facility];  Service: Endoscopy;  Laterality: N/A;  9:00  . engrown  toenail Bilateral   . EYE SURGERY     Laser for torn retina  . HEMORRHOID SURGERY     sept 8, 2008  . KIDNEY STONE SURGERY     lithotripsy 1980's  . KNEE SURGERY     left 04-11-94 ( has had two surgeries to left knee)  . NASAL SINUS SURGERY     Jun 30 1998  . NM MYOVIEW LTD  07/20/2009   no ischemia  . PILONIDAL CYST / SINUS EXCISION     surgery  01-08-74  . POLYPECTOMY  08/26/2019   Procedure: POLYPECTOMY;  Surgeon: Daneil Dolin, MD;  Location: [facility];  Service: Endoscopy;;  . RADIOLOGY WITH ANESTHESIA N/A 06/12/2018   Procedure: MRI OF CHEST WITH AND WITHOUT CONTRAST;  Surgeon: Radiologist, Medication, MD;  Location: [facility];  Service: Radiology;  Laterality: N/A;  . SHOULDER SURGERY     right 12-20-2003  . TOTAL KNEE ARTHROPLASTY Left 09/15/2019   Procedure: TOTAL KNEE ARTHROPLASTY;  Surgeon: Renette Butters, MD;  Location: [facility];  Service: Orthopedics;  Laterality: Left;  . TRIGGER FINGER RELEASE     03-21-98 right hand ring finger  . US ECHOCARDIOGRAPHY  07/20/2009   LA & RA mildly dilated,trace MR,TR.    There were no vitals filed for  this visit.   Subjective Assessment - 10/05/19 1654    Subjective  Patient presents to physical therapy with complaint of LT knee pain s/p LT TKA on 09/15/19. Patient pain has been manageable, and that he is not currently taking pain meds. Patient says nights are the worst, says it is difficulty finding comfortable position to rest knee. Patient says he has been getting around well using SPC, says he usually does not need it on level ground, but does use on uneven surface and outside. Patient says he has been managing sx with regular icing, still has some swelling. Says he is no longer wearing compression stocking. Patient reports having [place] therapy 6 sessions, and notes that he is still using CPM at home. Has set ROM up to 95 degrees.    Limitations  Sitting;Lifting;Standing;Walking;House hold activities    How long can you stand comfortably?  30 min    How long can you walk comfortably?  15-30 min    Patient Stated Goals  Get back to where I'm 100 %    Currently in Pain?  Yes    Pain Score  1     Pain Location  Knee    Pain Orientation  Left    Pain Descriptors / Indicators  Sharp    Pain Type  Surgical pain    Pain Onset  1 to 4 weeks ago    Pain Frequency  Intermittent    Aggravating Factors   stanidng, bending, walking, prolonged positions    Pain Relieving Factors  rest, ice    Effect of Pain on Daily Activities  Limits         [facility] PT Assessment - 10/05/19 0001      Assessment   Medical Diagnosis  LT TKA     Referring Provider (PT)  Edmonia Lynch MD     Onset Date/Surgical Date  --   09/15/19   Next MD Visit  10/30/19    Prior Therapy  Yes in acute and [facility] therapy       Precautions   Precautions  None      Restrictions   Weight Bearing Restrictions  No      Balance Screen   Has the patient fallen in the past 6 months  No    Has the patient had a decrease in activity level because of a fear of falling?   No    Is the patient reluctant to leave their home because of a fear  of falling?   No      Home Environment   Living Environment  Private residence    Living Arrangements  Alone    Type of [place] Access  Stairs to enter    [employer] of Steps  4    Entrance Stairs-Rails  Right    Home Layout  One level      Prior Function   Level of Independence  Independent      Cognition   Overall Cognitive Status  Within Functional Limits for tasks assessed      Observation/Other Assessments   Observations  incision appears intact, steri strips in place. No signs of visible drainage    Focus on Therapeutic Outcomes (FOTO)   41% limited       Observation/Other Assessments-Edema    Edema  --   mod edema noted diffuse about LT knee joint      Sensation   Light Touch  Appears Intact      ROM / Strength   AROM / PROM / Strength  AROM;Strength      AROM   AROM Assessment Site  Knee    Right/Left Knee  Right;Left    Left Knee Extension  12    Left Knee Flexion  95      Strength   Strength Assessment Site  Hip;Ankle;Knee    Right/Left Hip  Right;Left    Right Hip Flexion  5/5    Right Hip Extension  4+/5    Right Hip ABduction  5/5    Left Hip Flexion  4+/5    Left Hip Extension  4/5    Left Hip ABduction  4+/5    Right/Left Knee  Right;Left    Right Knee Flexion  5/5    Right Knee Extension  5/5    Left Knee Flexion  4/5    Left Knee Extension  4/5    Right/Left Ankle  Right;Left    Right Ankle Dorsiflexion  5/5    Left Ankle Dorsiflexion  5/5      Palpation   Palpation comment  min/ mod tenderness to palpation diffuse about Lt knee joint and lateral thigh       Transfers   Five time sit to stand comments   14.7 sec with no UE assist      Ambulation/Gait   Ambulation/Gait  Yes    Ambulation/Gait Assistance  6: Modified independent (Device/Increase time)    Assistive device  Straight cane    Gait Pattern  Decreased stance time - left;Decreased stride length;Decreased hip/knee flexion - left    Ambulation Surface   Level;Indoor      Balance   Balance Assessed  Yes      Static Standing Balance   Static Standing Balance -  Activities   Tandam Stance - Right Leg;Tandam Stance - Left Leg    Static Standing - Comment/# of Minutes  25 sec mod sway; 12 sec mod sway                 Objective measurements completed on examination: See above findings.              PT Education - 10/05/19 1657    Education Details  On evaluation findings, POC and HEP    Person(s) Educated  Patient    Methods  Explanation    Comprehension  Verbalized understanding       PT Short Term Goals - 10/05/19 1829      PT SHORT TERM GOAL #1   Title  Patient will be independent with initial HEP to improve functional outcomes    Time  3    Period  Weeks  Status  New    Target Date  10/30/19        PT Long Term Goals - 10/05/19 1829      PT LONG TERM GOAL #1   Title  Patient will improve FOTO score to <25% to indicate improvement in functional outcomes    Time  6    Period  Weeks    Status  New    Target Date  11/20/19      PT LONG TERM GOAL #2   Title  Patient will have LT knee AROM 0-120 degrees to improve functional mobility and facilitate squatting to pick up items from floor.    Time  6    Period  Weeks    Status  New    Target Date  11/20/19      PT LONG TERM GOAL #3   Title  Patient will report at least 75% overall improvement in subjective complaint to indicate improvement in ability to perform ADLs.    Time  6    Period  Weeks    Status  New    Target Date  11/20/19      PT LONG TERM GOAL #4   Title  Patient will be able to maintain tandem stance >30 seconds on BLEs to improve stability and reduce risk for falls    Time  6    Period  Weeks    Status  New    Target Date  11/20/19      PT LONG TERM GOAL #5   Title  Patient will have equal to or > 4+/5 MMT throughout BLE to improve ability to perform functional mobility, stair ambulation and ADLs.    Time  6    Period  Weeks     Status  New    Target Date  11/20/19             Plan - 10/05/19 1729    Clinical Impression Statement  Patient is a 74 y.o. male who presents to physical therapy with complaint of LT knee pain s/p LT TKA on 09/15/19. Patient demonstrates decreased strength, ROM restriction, balance deficits and gait abnormalities which are likely contributing to symptoms of pain and are negatively impacting patient ability to perform ADLs and functional mobility tasks. Patient will benefit from skilled physical therapy services to address these deficits to reduce pain, improve level of function with ADLs, functional mobility tasks, and reduce risk for falls.    Examination-Activity Limitations  Bathing;Lift;Stand;Locomotion Level;Bend;Transfers;Carry;Sleep;Dressing;Squat;Stairs;Hygiene/Grooming;Sit    Examination-Participation Restrictions  Yard Work;Cleaning;Laundry;Volunteer;Driving;Community Activity    Stability/Clinical Decision Making  Stable/Uncomplicated    Clinical Decision Making  Low    Rehab Potential  Good    PT Frequency  2x / week    PT Duration  6 weeks    PT Treatment/Interventions  ADLs/Self Care Home Management;Aquatic Therapy;Biofeedback;Cryotherapy;Electrical Stimulation;Therapeutic exercise;Orthotic Fit/Training;Compression bandaging;Manual lymph drainage;Patient/family education;Therapeutic activities;Parrafin;Fluidtherapy;Contrast Bath;Manual techniques;Functional mobility training;Stair training;Moist Heat;Traction;Ultrasound;Iontophoresis 4mg /ml Dexamethasone;Gait training;DME Instruction;Balance training;Wheelchair mobility training;Neuromuscular re-education;Scar mobilization;Passive range of motion;Spinal Manipulations;Dry needling;Energy conservation;Splinting;Taping;Vasopneumatic Device;Joint Manipulations    PT Next Visit Plan  Review goals, and HEP. Progress table exercise (add bridge) and transition to standing as tolerated. Add heel raise, step ups, mini squats, static  balance as able.    PT Home Exercise Plan  10/05/19: quad set, SLR, heel slide    Consulted and Agree with Plan of Care  Patient       Patient will benefit from skilled therapeutic intervention in order to improve the following  deficits and impairments:  Abnormal gait, Pain, Increased fascial restricitons, Decreased mobility, Decreased scar mobility, Decreased activity tolerance, Decreased endurance, Decreased range of motion, Decreased strength, Hypomobility, Decreased balance, Difficulty walking, Increased edema, Impaired flexibility  Visit Diagnosis: Left knee pain, unspecified chronicity  Other abnormalities of gait and mobility     Problem List Patient Active Problem List   Diagnosis Date Noted  . PTSD (post-traumatic stress disorder) 09/01/2019  . GERD (gastroesophageal reflux disease) 09/01/2019  . Primary osteoarthritis of left knee 09/01/2019  . Grade II hemorrhoids 07/03/2019  . Hemorrhoids 05/06/2019  . History of colonic polyps 05/06/2019  . Cervical spondylosis with myelopathy and radiculopathy 12/24/2017  . Coronary artery disease involving native coronary artery of native heart without angina pectoris 12/23/2017  . Hypercholesterolemia 12/23/2017  . Mild obesity 09/20/2016  . Edema of both legs 01/17/2013  . Long term current use of anticoagulant therapy 12/30/2012  . History of deep vein thrombosis (DVT) of lower extremity 12/26/2012  . Dyslipidemia 12/26/2012  . Coronary atherosclerosis without stenosis 12/26/2012    6:32 PM, 10/05/19 Josue Hector PT DPT  Physical Therapist with [facility]  [phone]   [address] [facility] [address] [place], [place], [address] Phone: [phone]   Fax:  [phone]  Name: Casey Castillo MRN: [id number] Date of Birth: 1945-09-14

## 2019-10-06 DIAGNOSIS — F4312 Post-traumatic stress disorder, chronic: Secondary | ICD-10-CM | POA: Diagnosis not present

## 2019-10-07 ENCOUNTER — Encounter (HOSPITAL_COMMUNITY): Payer: Self-pay

## 2019-10-07 ENCOUNTER — Ambulatory Visit (HOSPITAL_COMMUNITY): Payer: PPO

## 2019-10-07 ENCOUNTER — Other Ambulatory Visit: Payer: Self-pay

## 2019-10-07 DIAGNOSIS — M25562 Pain in left knee: Secondary | ICD-10-CM

## 2019-10-07 DIAGNOSIS — R2689 Other abnormalities of gait and mobility: Secondary | ICD-10-CM

## 2019-10-07 NOTE — Therapy (Signed)
[facility] [place], [place], [address] Phone: [phone]   Fax:  [phone]  Physical Therapy Treatment  Patient Details  Name: Casey Castillo MRN: [id number] Date of Birth: 19-Sep-1945 Referring Provider (PT): Edmonia Lynch MD    Encounter Date: 10/07/2019  PT End of Session - 10/07/19 1139    Visit Number  2    Number of Visits  12    Date for PT Re-Evaluation  11/20/19    Authorization Type  [employer]; no auth req, no V.L.    Progress Note Due on Visit  10    PT Start Time  1135    PT Stop Time  1215    PT Time Calculation (min)  40 min    Activity Tolerance  Patient tolerated treatment well    Behavior During Therapy  WFL for tasks assessed/performed       Past Medical History:  Diagnosis Date  . Arthritis   . Cervical myelopathy with cervical radiculopathy   . Claustrophobia   . Coronary artery disease    5%blockage on left anterior descending Dr. Sallyanne Kuster   . Diverticulitis    hx of  . Enlarged prostate    takes flomax  . GERD (gastroesophageal reflux disease)   . History of blood clots    november, 5 1995- left leg; 2014 - left calf  . History of kidney stones   . Hyperlipidemia    primary Dr. Selena Batten  . Pneumonia    as a child  74 YEARS OLD  . PTSD (post-traumatic stress disorder)   . PTSD (post-traumatic stress disorder)   . Seasonal allergies   . Urinary frequency    hx of     Past Surgical History:  Procedure Laterality Date  . ANTERIOR CERVICAL DECOMP/DISCECTOMY FUSION  01/28/2012   Procedure: ANTERIOR CERVICAL DECOMPRESSION/DISCECTOMY FUSION 2 LEVELS;  Surgeon: Kristeen Miss, MD;  Location: [facility];  Service: Neurosurgery;  Laterality: N/A;  Cervical six-seven, cervical seven-thoracic one Anterior cervical decompression/diskectomy/fusion  . ANTERIOR CERVICAL DECOMP/DISCECTOMY FUSION N/A 12/24/2017   Procedure: ANTERIOR CERVICAL DECOMPRESSION/DISCECTOMY FUSION CERVICAL  FOUR-FIVE/CERVICAL FIVE-SIX TWO LEVELS;  Surgeon: Kristeen Miss, MD;  Location: [place];  Service: Neurosurgery;  Laterality: N/A;  ANTERIOR CERVICAL DECOMPRESSION/DISCECTOMY FUSION CERVICAL FOUR-FIVE/CERVICAL FIVE-SIX TWO LEVELS  . CARDIAC CATHETERIZATION     2006  . CARDIOVASCULAR STRESS TEST     2006 and 2008 at [facility] heart and vascular, 2011 myoview and echo  . COLONOSCOPY    . COLONOSCOPY N/A 08/30/2015   Dr. Arnoldo Morale: one 20 mm polyp in proximal ascending colon, removed piecemeal using hot snare. Resected and retrieved. Diverticulosis in sigmoid colon. Path with tubular adenoma, no high grade dysplasia. 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 COLONOSCOPY N/A 08/26/2019   Procedure: COLONOSCOPY;  Surgeon: Daneil Dolin, MD;  [facility];  Service: Endoscopy;  Laterality: N/A;  9:00  . engrown  toenail Bilateral   . EYE SURGERY     Laser for torn retina  . HEMORRHOID SURGERY     sept 8, 2008  . KIDNEY STONE SURGERY     lithotripsy 1980's  . KNEE SURGERY     left 04-11-94 ( has had two surgeries to left knee)  . NASAL SINUS SURGERY     Jun 30 1998  . NM MYOVIEW LTD  07/20/2009   no ischemia  . PILONIDAL CYST / SINUS EXCISION     surgery  01-08-74  . POLYPECTOMY  08/26/2019   Procedure: POLYPECTOMY;  Surgeon: Daneil Dolin, MD;  [facility];  Service: Endoscopy;;  . RADIOLOGY WITH ANESTHESIA N/A 06/12/2018   Procedure: MRI OF CHEST WITH AND WITHOUT CONTRAST;  Surgeon: Radiologist, Medication, MD;  Location: [facility];  Service: Radiology;  Laterality: N/A;  . SHOULDER SURGERY     right 12-20-2003  . TOTAL KNEE ARTHROPLASTY Left 09/15/2019   Procedure: TOTAL KNEE ARTHROPLASTY;  Surgeon: Renette Butters, MD;  [facility]: [facility];  Service: Orthopedics;  Laterality: Left;  . TRIGGER FINGER RELEASE     03-21-98 right hand ring finger  . US ECHOCARDIOGRAPHY  07/20/2009   LA & RA mildly dilated,trace MR,TR.    There were no vitals filed for  this visit.  Subjective Assessment - 10/07/19 1133    Subjective  Pt arrived with SPC, stated he uses it for uneven surfaces.  Reports some soreness medial aspect of knee and swelling present.    Limitations  Sitting;Lifting;Standing;Walking;House hold activities    Patient Stated Goals  Get back to where I'm 100 %    Currently in Pain?  Yes    Pain Location  Knee    Pain Orientation  Left    Pain Descriptors / Indicators  Sore;Tightness    Pain Type  Surgical pain    Pain Onset  1 to 4 weeks ago    Aggravating Factors   standing, bending, walking, prolonged positoins    Pain Relieving Factors  rest. ice    Effect of Pain on Daily Activities  limits                       [facility] Adult PT Treatment/Exercise - 10/07/19 0001      Exercises   Exercises  Knee/Hip      Knee/Hip Exercises: Stretches   Gastroc Stretch  2 reps;30 seconds    Gastroc Stretch Limitations  slant board      Knee/Hip Exercises: Standing   Heel Raises  10 reps    Terminal Knee Extension  10 reps;Theraband    Theraband Level (Terminal Knee Extension)  Other (comment)    Terminal Knee Extension Limitations  5" holds, purple    Gait Training  220 ft no AD, cueing for heel strike, knee flexion wiht toe push off and equal stride length      Knee/Hip Exercises: Supine   Quad Sets  Left;10 reps    Quad Sets Limitations  5" holds    Short Arc [employer]  2 sets;10 reps    Short Arc Quad Sets Limitations  3-5" holds    Heel Slides  10 reps    Knee Extension  AROM    Knee Extension Limitations  10    Knee Flexion  AROM    Knee Flexion Limitations  105             PT Education - 10/07/19 1150    Education Details  Reviewed goals, assured compliance with HEP, reviewed RICE techniques for pain and edema control    Methods  Explanation    Comprehension  Verbalized understanding       PT Short Term Goals - 10/05/19 1829      PT SHORT TERM GOAL #1   Title  Patient will be independent with  initial HEP to improve functional outcomes    Time  3    Period  Weeks    Status  New  Target Date  10/30/19        PT Long Term Goals - 10/05/19 1829      PT LONG TERM GOAL #1   Title  Patient will improve FOTO score to <25% to indicate improvement in functional outcomes    Time  6    Period  Weeks    Status  New    Target Date  11/20/19      PT LONG TERM GOAL #2   Title  Patient will have LT knee AROM 0-120 degrees to improve functional mobility and facilitate squatting to pick up items from floor.    Time  6    Period  Weeks    Status  New    Target Date  11/20/19      PT LONG TERM GOAL #3   Title  Patient will report at least 75% overall improvement in subjective complaint to indicate improvement in ability to perform ADLs.    Time  6    Period  Weeks    Status  New    Target Date  11/20/19      PT LONG TERM GOAL #4   Title  Patient will be able to maintain tandem stance >30 seconds on BLEs to improve stability and reduce risk for falls    Time  6    Period  Weeks    Status  New    Target Date  11/20/19      PT LONG TERM GOAL #5   Title  Patient will have equal to or > 4+/5 MMT throughout BLE to improve ability to perform functional mobility, stair ambulation and ADLs.    Time  6    Period  Weeks    Status  New    Target Date  11/20/19            Plan - 10/07/19 1254    Clinical Impression Statement  Reviewed goals, educated importance of HEP complaince and assure proper form.   Pt able to demonstrate and verbalize exercises without assistance.  Session focus with knee mobility.  Added TKE exercises and gluteal strengthening.  ROM is progressing well with improved 10-105 degrees (was 12-95 last session).    Examination-Activity Limitations  Bathing;Lift;Stand;Locomotion Level;Bend;Transfers;Carry;Sleep;Dressing;Squat;Stairs;Hygiene/Grooming;Sit    Examination-Participation Restrictions  Yard Work;Cleaning;Laundry;Volunteer;Driving;Community Activity     Stability/Clinical Decision Making  Stable/Uncomplicated    Clinical Decision Making  Low    Rehab Potential  Good    PT Frequency  2x / week    PT Duration  6 weeks    PT Treatment/Interventions  ADLs/Self Care Home Management;Aquatic Therapy;Biofeedback;Cryotherapy;Electrical Stimulation;Therapeutic exercise;Orthotic Fit/Training;Compression bandaging;Manual lymph drainage;Patient/family education;Therapeutic activities;Parrafin;Fluidtherapy;Contrast Bath;Manual techniques;Functional mobility training;Stair training;Moist Heat;Traction;Ultrasound;Iontophoresis 4mg /ml Dexamethasone;Gait training;DME Instruction;Balance training;Wheelchair mobility training;Neuromuscular re-education;Scar mobilization;Passive range of motion;Spinal Manipulations;Dry needling;Energy conservation;Splinting;Taping;Vasopneumatic Device;Joint Manipulations    PT Next Visit Plan  Progress table exercise (add bridge) and transition to standing as tolerated. Add heel raise, step ups, mini squats, static balance as able.    PT Home Exercise Plan  10/05/19: quad set, SLR, heel slide       Patient will benefit from skilled therapeutic intervention in order to improve the following deficits and impairments:  Abnormal gait, Pain, Increased fascial restricitons, Decreased mobility, Decreased scar mobility, Decreased activity tolerance, Decreased endurance, Decreased range of motion, Decreased strength, Hypomobility, Decreased balance, Difficulty walking, Increased edema, Impaired flexibility  Visit Diagnosis: Left knee pain, unspecified chronicity  Other abnormalities of gait and mobility     Problem List Patient Active Problem  List   Diagnosis Date Noted  . PTSD (post-traumatic stress disorder) 09/01/2019  . GERD (gastroesophageal reflux disease) 09/01/2019  . Primary osteoarthritis of left knee 09/01/2019  . Grade II hemorrhoids 07/03/2019  . Hemorrhoids 05/06/2019  . History of colonic polyps 05/06/2019  . Cervical  spondylosis with myelopathy and radiculopathy 12/24/2017  . Coronary artery disease involving native coronary artery of native heart without angina pectoris 12/23/2017  . Hypercholesterolemia 12/23/2017  . Mild obesity 09/20/2016  . Edema of both legs 01/17/2013  . Long term current use of anticoagulant therapy 12/30/2012  . History of deep vein thrombosis (DVT) of lower extremity 12/26/2012  . Dyslipidemia 12/26/2012  . Coronary atherosclerosis without stenosis 12/26/2012   Ihor Austin, LPTA/CLT; CBIS [phone]  Aldona Lento 10/07/2019, 1:03 PM  [facility] [address] [place], [place], [address] Phone: [phone]   Fax:  [phone]  Name: Casey Castillo MRN: [id number] Date of Birth: 05-24-1946

## 2019-10-09 ENCOUNTER — Other Ambulatory Visit: Payer: Self-pay

## 2019-10-09 ENCOUNTER — Encounter (HOSPITAL_COMMUNITY): Payer: Self-pay

## 2019-10-09 ENCOUNTER — Ambulatory Visit (HOSPITAL_COMMUNITY): Payer: PPO

## 2019-10-09 DIAGNOSIS — R29898 Other symptoms and signs involving the musculoskeletal system: Secondary | ICD-10-CM

## 2019-10-09 DIAGNOSIS — M25562 Pain in left knee: Secondary | ICD-10-CM | POA: Diagnosis not present

## 2019-10-09 DIAGNOSIS — R2689 Other abnormalities of gait and mobility: Secondary | ICD-10-CM

## 2019-10-09 NOTE — Therapy (Signed)
[facility] [place], [place], [address] Phone: [phone]   Fax:  [phone]  Physical Therapy Treatment  Patient Details  Name: TYWANN Castillo MRN: [id number] Date of Birth: [birth year] Referring Provider (PT): Edmonia Lynch MD    Encounter Date: 10/09/2019  PT End of Session - 10/09/19 1642    Visit Number  3    Number of Visits  12    Date for PT Re-Evaluation  11/20/19    Authorization Type  [employer]; no auth req, no V.L.    Progress Note Due on Visit  10    PT Start Time  1613   3' on bike for mobility, not included with charges   PT Stop Time  1656    PT Time Calculation (min)  43 min    Activity Tolerance  Patient tolerated treatment well    Behavior During Therapy  [facility] for tasks assessed/performed       Past Medical History:  Diagnosis Date  . Arthritis   . Cervical myelopathy with cervical radiculopathy   . Claustrophobia   . Coronary artery disease    5%blockage on left anterior descending Dr. Sallyanne Kuster   . Diverticulitis    hx of  . Enlarged prostate    takes flomax  . GERD (gastroesophageal reflux disease)   . History of blood clots    november, 5 1995- left leg; 2014 - left calf  . History of kidney stones   . Hyperlipidemia    primary Dr. Selena Batten  . Pneumonia    as a child  74 YEARS OLD  . PTSD (post-traumatic stress disorder)   . PTSD (post-traumatic stress disorder)   . Seasonal allergies   . Urinary frequency    hx of     Past Surgical History:  Procedure Laterality Date  . ANTERIOR CERVICAL DECOMP/DISCECTOMY FUSION  01/28/2012   Procedure: ANTERIOR CERVICAL DECOMPRESSION/DISCECTOMY FUSION 2 LEVELS;  Surgeon: Kristeen Miss, MD;  Location: [place] [facility];  Service: Neurosurgery;  Laterality: N/A;  Cervical six-seven, cervical seven-thoracic one Anterior cervical decompression/diskectomy/fusion  . ANTERIOR CERVICAL DECOMP/DISCECTOMY FUSION N/A 12/24/2017   Procedure: ANTERIOR  CERVICAL DECOMPRESSION/DISCECTOMY FUSION CERVICAL FOUR-FIVE/CERVICAL FIVE-SIX TWO LEVELS;  Surgeon: Kristeen Miss, MD;  Location: [place];  Service: Neurosurgery;  Laterality: N/A;  ANTERIOR CERVICAL DECOMPRESSION/DISCECTOMY FUSION CERVICAL FOUR-FIVE/CERVICAL FIVE-SIX TWO LEVELS  . CARDIAC CATHETERIZATION     2006  . CARDIOVASCULAR STRESS TEST     2006 and 2008 at [facility] heart and vascular, 2011 myoview and echo  . COLONOSCOPY    . COLONOSCOPY N/A 08/30/2015   Dr. Arnoldo Morale: one 20 mm polyp in proximal ascending colon, removed piecemeal using hot snare. Resected and retrieved. Diverticulosis in sigmoid colon. Path with tubular adenoma, no high grade dysplasia. 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 COLONOSCOPY N/A 08/26/2019   Procedure: COLONOSCOPY;  Surgeon: Daneil Dolin, MD;  [facility];  Service: Endoscopy;  Laterality: N/A;  9:00  . engrown  toenail Bilateral   . EYE SURGERY     Laser for torn retina  . HEMORRHOID SURGERY     sept 8, 2008  . KIDNEY STONE SURGERY     lithotripsy 1980's  . KNEE SURGERY     left 04-11-94 ( has had two surgeries to left knee)  . NASAL SINUS SURGERY     Jun 30 1998  . NM MYOVIEW LTD  07/20/2009   no ischemia  .  PILONIDAL CYST / SINUS EXCISION     surgery 01-08-74  . POLYPECTOMY  08/26/2019   Procedure: POLYPECTOMY;  Surgeon: Daneil Dolin, MD;  [facility];  Service: Endoscopy;;  . RADIOLOGY WITH ANESTHESIA N/A 06/12/2018   Procedure: MRI OF CHEST WITH AND WITHOUT CONTRAST;  Surgeon: Radiologist, Medication, MD;  Location: [place];  Service: Radiology;  Laterality: N/A;  . SHOULDER SURGERY     right 12-20-2003  . TOTAL KNEE ARTHROPLASTY Left 09/15/2019   Procedure: TOTAL KNEE ARTHROPLASTY;  Surgeon: Renette Butters, MD;  [facility];  Service: Orthopedics;  Laterality: Left;  . TRIGGER FINGER RELEASE     03-21-98 right hand ring finger  . US ECHOCARDIOGRAPHY  07/20/2009   LA & RA mildly  dilated,trace MR,TR.    There were no vitals filed for this visit.  Subjective Assessment - 10/09/19 1617    Subjective  Pt reports knee is stiff today.  Pain scale range from 1-3/10 dependending upon position.  Reports HEP compliance daily.    Patient Stated Goals  Get back to where I'm 100 %    Currently in Pain?  Yes    Pain Location  Knee    Pain Orientation  Left    Pain Descriptors / Indicators  Sore;Tightness    Pain Type  Surgical pain    Pain Onset  1 to 4 weeks ago    Pain Frequency  Intermittent    Aggravating Factors   standing, bending, walking, prolonged positoins    Pain Relieving Factors  rest, ice    Effect of Pain on Daily Activities  limits                       [facility] Adult PT Treatment/Exercise - 10/09/19 0001      Exercises   Exercises  Knee/Hip      Knee/Hip Exercises: Stretches   Active Hamstring Stretch  3 reps;30 seconds    Active Hamstring Stretch Limitations  supine with rope    Knee: Self-Stretch to increase Flexion  5 reps;10 seconds    Knee: Self-Stretch Limitations  knee drive on 2nd step, 10" holds    Gastroc Stretch  3 reps;30 seconds    Gastroc Stretch Limitations  slant board      Knee/Hip Exercises: Aerobic   Stationary Bike  full revolution x 3' seat 14      Knee/Hip Exercises: Standing   Heel Raises  10 reps    Terminal Knee Extension  15 reps;Theraband    Theraband Level (Terminal Knee Extension)  Other (comment)    Terminal Knee Extension Limitations  5" holds, purple    Functional Squat  5 reps    Functional Squat Limitations  minisquats    Rocker Board  2 minutes    Rocker Board Limitations  lateral    Gait Training  228ft no AD, cueingfor mechanics      Knee/Hip Exercises: Supine   Quad Sets  Left;10 reps    Quad Sets Limitations  10" holds    Short Arc [employer]  2 sets;10 reps    Short Arc Quad Sets Limitations  3-5" holds    Heel Slides  10 reps    Straight Leg Raises  10 reps    Straight Leg Raises  Limitations  quad set prior    Knee Extension  AROM    Knee Extension Limitations  8    Knee Flexion  AROM    Knee  Flexion Limitations  110               PT Short Term Goals - 10/05/19 1829      PT SHORT TERM GOAL #1   Title  Patient will be independent with initial HEP to improve functional outcomes    Time  3    Period  Weeks    Status  New    Target Date  10/30/19        PT Long Term Goals - 10/05/19 1829      PT LONG TERM GOAL #1   Title  Patient will improve FOTO score to <25% to indicate improvement in functional outcomes    Time  6    Period  Weeks    Status  New    Target Date  11/20/19      PT LONG TERM GOAL #2   Title  Patient will have LT knee AROM 0-120 degrees to improve functional mobility and facilitate squatting to pick up items from floor.    Time  6    Period  Weeks    Status  New    Target Date  11/20/19      PT LONG TERM GOAL #3   Title  Patient will report at least 75% overall improvement in subjective complaint to indicate improvement in ability to perform ADLs.    Time  6    Period  Weeks    Status  New    Target Date  11/20/19      PT LONG TERM GOAL #4   Title  Patient will be able to maintain tandem stance >30 seconds on BLEs to improve stability and reduce risk for falls    Time  6    Period  Weeks    Status  New    Target Date  11/20/19      PT LONG TERM GOAL #5   Title  Patient will have equal to or > 4+/5 MMT throughout BLE to improve ability to perform functional mobility, stair ambulation and ADLs.    Time  6    Period  Weeks    Status  New    Target Date  11/20/19            Plan - 10/09/19 1643    Clinical Impression Statement  Added rockerboard and gait training without AD, cueing to improve heel strike and equalized stride length.  Continued knee mobility exercises and additional stretches as well as quad/gluteal strengthening.  Pt able to make full revolution on bike.  AROM 8-110 degrees     Examination-Activity Limitations  Bathing;Lift;Stand;Locomotion Level;Bend;Transfers;Carry;Sleep;Dressing;Squat;Stairs;Hygiene/Grooming;Sit    Examination-Participation Restrictions  Yard Work;Cleaning;Laundry;Volunteer;Driving;Community Activity    Stability/Clinical Decision Making  Stable/Uncomplicated    Clinical Decision Making  Low    Rehab Potential  Good    PT Frequency  2x / week    PT Duration  6 weeks    PT Treatment/Interventions  ADLs/Self Care Home Management;Aquatic Therapy;Biofeedback;Cryotherapy;Electrical Stimulation;Therapeutic exercise;Orthotic Fit/Training;Compression bandaging;Manual lymph drainage;Patient/family education;Therapeutic activities;Parrafin;Fluidtherapy;Contrast Bath;Manual techniques;Functional mobility training;Stair training;Moist Heat;Traction;Ultrasound;Iontophoresis 4mg /ml Dexamethasone;Gait training;DME Instruction;Balance training;Wheelchair mobility training;Neuromuscular re-education;Scar mobilization;Passive range of motion;Spinal Manipulations;Dry needling;Energy conservation;Splinting;Taping;Vasopneumatic Device;Joint Manipulations    PT Next Visit Plan  Add TKE supine and prone, continue stretches for end range.  Continue knee mobilty exercises.  Add step ups, mini squats, static balance as able.    PT Home Exercise Plan  10/05/19: quad set, SLR, heel slide, 4/30: bridges       Patient will  benefit from skilled therapeutic intervention in order to improve the following deficits and impairments:  Abnormal gait, Pain, Increased fascial restricitons, Decreased mobility, Decreased scar mobility, Decreased activity tolerance, Decreased endurance, Decreased range of motion, Decreased strength, Hypomobility, Decreased balance, Difficulty walking, Increased edema, Impaired flexibility  Visit Diagnosis: Other symptoms and signs involving the musculoskeletal system  Other abnormalities of gait and mobility  Left knee pain, unspecified  chronicity     Problem List Patient Active Problem List   Diagnosis Date Noted  . PTSD (post-traumatic stress disorder) 09/01/2019  . GERD (gastroesophageal reflux disease) 09/01/2019  . Primary osteoarthritis of left knee 09/01/2019  . Grade II hemorrhoids 07/03/2019  . Hemorrhoids 05/06/2019  . History of colonic polyps 05/06/2019  . Cervical spondylosis with myelopathy and radiculopathy 12/24/2017  . Coronary artery disease involving native coronary artery of native heart without angina pectoris 12/23/2017  . Hypercholesterolemia 12/23/2017  . Mild obesity 09/20/2016  . Edema of both legs 01/17/2013  . Long term current use of anticoagulant therapy 12/30/2012  . History of deep vein thrombosis (DVT) of lower extremity 12/26/2012  . Dyslipidemia 12/26/2012  . Coronary atherosclerosis without stenosis 12/26/2012   Ihor Austin, LPTA/CLT; CBIS [phone]  Aldona Lento 10/09/2019, 5:55 PM  [facility] [address] [place], [place], [address] Phone: [phone]   Fax:  [phone]  Name: Casey Castillo MRN: [id number] Date of Birth: 07-28-1945

## 2019-10-09 NOTE — Patient Instructions (Signed)
Bridging    Slowly raise buttocks from floor, keeping stomach tight. Repeat 10 times per set. Do 2 sets per session.   http://orth.exer.us/1096   Copyright  VHI. All rights reserved.

## 2019-10-13 ENCOUNTER — Ambulatory Visit (HOSPITAL_COMMUNITY): Payer: PPO | Attending: Orthopedic Surgery

## 2019-10-13 ENCOUNTER — Encounter (HOSPITAL_COMMUNITY): Payer: Self-pay

## 2019-10-13 ENCOUNTER — Other Ambulatory Visit: Payer: Self-pay

## 2019-10-13 DIAGNOSIS — M25562 Pain in left knee: Secondary | ICD-10-CM | POA: Diagnosis not present

## 2019-10-13 DIAGNOSIS — R29898 Other symptoms and signs involving the musculoskeletal system: Secondary | ICD-10-CM | POA: Diagnosis not present

## 2019-10-13 DIAGNOSIS — R2689 Other abnormalities of gait and mobility: Secondary | ICD-10-CM | POA: Diagnosis not present

## 2019-10-13 NOTE — Therapy (Signed)
[place], [place], [address] Phone: [phone]   Fax:  [phone]  Physical Therapy Treatment  Patient Details  Name: Casey Castillo MRN: [id number] Date of Birth: Nov 25, 1945 Referring Provider (PT): Edmonia Lynch MD    Encounter Date: 10/13/2019  PT End of Session - 10/13/19 1404    Visit Number  4    Number of Visits  12    Date for PT Re-Evaluation  11/20/19    Authorization Type  [employer]; no auth req, no V.L.    Progress Note Due on Visit  10    PT Start Time  1358   4' on bike, not included with charges   PT Stop Time  1443    PT Time Calculation (min)  45 min    Activity Tolerance  Patient tolerated treatment well    Behavior During Therapy  [facility] for tasks assessed/performed       Past Medical History:  Diagnosis Date  . Arthritis   . Cervical myelopathy with cervical radiculopathy   . Claustrophobia   . Coronary artery disease    5%blockage on left anterior descending Dr. Sallyanne Kuster   . Diverticulitis    hx of  . Enlarged prostate    takes flomax  . GERD (gastroesophageal reflux disease)   . History of blood clots    november, 5 1995- left leg; 2014 - left calf  . History of kidney stones   . Hyperlipidemia    primary Dr. Selena Batten  . Pneumonia    as a child  41 YEARS OLD  . PTSD (post-traumatic stress disorder)   . PTSD (post-traumatic stress disorder)   . Seasonal allergies   . Urinary frequency    hx of     Past Surgical History:  Procedure Laterality Date  . ANTERIOR CERVICAL DECOMP/DISCECTOMY FUSION  01/28/2012   Procedure: ANTERIOR CERVICAL DECOMPRESSION/DISCECTOMY FUSION 2 LEVELS;  Surgeon: Kristeen Miss, MD;  Location: [place] [facility];  Service: Neurosurgery;  Laterality: N/A;  Cervical six-seven, cervical seven-thoracic one Anterior cervical decompression/diskectomy/fusion  . ANTERIOR CERVICAL DECOMP/DISCECTOMY FUSION N/A 12/24/2017   Procedure: ANTERIOR CERVICAL  DECOMPRESSION/DISCECTOMY FUSION CERVICAL FOUR-FIVE/CERVICAL FIVE-SIX TWO LEVELS;  Surgeon: Kristeen Miss, MD;  Location: [place];  Service: Neurosurgery;  Laterality: N/A;  ANTERIOR CERVICAL DECOMPRESSION/DISCECTOMY FUSION CERVICAL FOUR-FIVE/CERVICAL FIVE-SIX TWO LEVELS  . CARDIAC CATHETERIZATION     2006  . CARDIOVASCULAR STRESS TEST     2006 and 2008 at [facility] vascular, 2011 myoview and echo  . COLONOSCOPY    . COLONOSCOPY N/A 08/30/2015   Dr. Arnoldo Morale: one 20 mm polyp in proximal ascending colon, removed piecemeal using hot snare. Resected and retrieved. Diverticulosis in sigmoid colon. Path with tubular adenoma, no high grade dysplasia. 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 COLONOSCOPY N/A 08/26/2019   Procedure: COLONOSCOPY;  Surgeon: Daneil Dolin, MD;  [facility];  Service: Endoscopy;  Laterality: N/A;  9:00  . engrown  toenail Bilateral   . EYE SURGERY     Laser for torn retina  . HEMORRHOID SURGERY     sept 8, 2008  . KIDNEY STONE SURGERY     lithotripsy 1980's  . KNEE SURGERY     left 04-11-94 ( has had two surgeries to left knee)  . NASAL SINUS SURGERY     Jun 30 1998  . NM MYOVIEW LTD  07/20/2009   no ischemia  . PILONIDAL CYST /  SINUS EXCISION     surgery 01-08-74  . POLYPECTOMY  08/26/2019   Procedure: POLYPECTOMY;  Surgeon: Daneil Dolin, MD;  [facility];  Service: Endoscopy;;  . RADIOLOGY WITH ANESTHESIA N/A 06/12/2018   Procedure: MRI OF CHEST WITH AND WITHOUT CONTRAST;  Surgeon: Radiologist, Medication, MD;  Location: [place];  Service: Radiology;  Laterality: N/A;  . SHOULDER SURGERY     right 12-20-2003  . TOTAL KNEE ARTHROPLASTY Left 09/15/2019   Procedure: TOTAL KNEE ARTHROPLASTY;  Surgeon: Renette Butters, MD;  [facility];  Service: Orthopedics;  Laterality: Left;  . TRIGGER FINGER RELEASE     03-21-98 right hand ring finger  . US ECHOCARDIOGRAPHY  07/20/2009   LA & RA mildly dilated,trace  MR,TR.    There were no vitals filed for this visit.  Subjective Assessment - 10/13/19 1403    Subjective  Pt stated knee is stiff today, pain scale 1/10 constant stiffness.  Reports they will come tomorrow to pick up CPM.    Patient Stated Goals  Get back to where I'm 100 %    Currently in Pain?  Yes    Pain Score  1     Pain Location  Knee    Pain Orientation  Left    Pain Descriptors / Indicators  Tightness    Pain Type  Surgical pain    Pain Onset  1 to 4 weeks ago    Pain Frequency  Intermittent    Aggravating Factors   standing, bending, walking, prolonged positions    Pain Relieving Factors  rest, ice    Effect of Pain on Daily Activities  limits                       [facility] Adult PT Treatment/Exercise - 10/13/19 0001      Exercises   Exercises  Knee/Hip      Knee/Hip Exercises: Stretches   Active Hamstring Stretch  3 reps;30 seconds    Active Hamstring Stretch Limitations  12in step height    Quad Stretch  3 reps;30 seconds    Quad Stretch Limitations  prone with rope    Knee: Self-Stretch to increase Flexion  5 reps;10 seconds    Knee: Self-Stretch Limitations  knee drive on [id number] step height    Gastroc Stretch  3 reps;30 seconds    Gastroc Stretch Limitations  slant board      Knee/Hip Exercises: Aerobic   Stationary Bike  full revolution x 3' seat 14      Knee/Hip Exercises: Standing   Terminal Knee Extension  15 reps;Theraband    Theraband Level (Terminal Knee Extension)  Level 2 (Red)    Terminal Knee Extension Limitations  5" holds    Lateral Step Up  Left;10 reps;Hand Hold: 2;Step Height: 4"    Functional Squat  10 reps    Functional Squat Limitations  front of chair      Knee/Hip Exercises: Seated   Other Seated Knee/Hip Exercises  Heel prop wiht 5# on thigh    Sit to Sand  10 reps;without UE support   eccentric control     Knee/Hip Exercises: Supine   Terminal Knee Extension  10 reps;Left   5" holds   Knee Extension  AROM    Knee  Flexion  AROM      Knee/Hip Exercises: Prone   Other Prone Exercises  TKE 10x 5" holds  PT Short Term Goals - 10/05/19 1829      PT SHORT TERM GOAL #1   Title  Patient will be independent with initial HEP to improve functional outcomes    Time  3    Period  Weeks    Status  New    Target Date  10/30/19        PT Long Term Goals - 10/05/19 1829      PT LONG TERM GOAL #1   Title  Patient will improve FOTO score to <25% to indicate improvement in functional outcomes    Time  6    Period  Weeks    Status  New    Target Date  11/20/19      PT LONG TERM GOAL #2   Title  Patient will have LT knee AROM 0-120 degrees to improve functional mobility and facilitate squatting to pick up items from floor.    Time  6    Period  Weeks    Status  New    Target Date  11/20/19      PT LONG TERM GOAL #3   Title  Patient will report at least 75% overall improvement in subjective complaint to indicate improvement in ability to perform ADLs.    Time  6    Period  Weeks    Status  New    Target Date  11/20/19      PT LONG TERM GOAL #4   Title  Patient will be able to maintain tandem stance >30 seconds on BLEs to improve stability and reduce risk for falls    Time  6    Period  Weeks    Status  New    Target Date  11/20/19      PT LONG TERM GOAL #5   Title  Patient will have equal to or > 4+/5 MMT throughout BLE to improve ability to perform functional mobility, stair ambulation and ADLs.    Time  6    Period  Weeks    Status  New    Target Date  11/20/19            Plan - 10/13/19 1429    Clinical Impression Statement  Pt progressing well towards POC.  Pt demonstrates improved gait mechanics without AD and no cueing required.  Continued session focus wiht knee mobility and additional TKE activities and functional strenghtening exercises.  Pt able to demonstrate good form wiht additoinal lateral step up, squats and STS with good control.  AROM 6-120 degrees  (was 8-110 degress last session.)    Examination-Activity Limitations  Bathing;Lift;Stand;Locomotion Level;Bend;Transfers;Carry;Sleep;Dressing;Squat;Stairs;Hygiene/Grooming;Sit    Examination-Participation Restrictions  Yard Work;Cleaning;Laundry;Volunteer;Driving;Community Activity    Stability/Clinical Decision Making  Stable/Uncomplicated    Clinical Decision Making  Low    Rehab Potential  Good    PT Frequency  2x / week    PT Duration  6 weeks    PT Treatment/Interventions  ADLs/Self Care Home Management;Aquatic Therapy;Biofeedback;Cryotherapy;Electrical Stimulation;Therapeutic exercise;Orthotic Fit/Training;Compression bandaging;Manual lymph drainage;Patient/family education;Therapeutic activities;Parrafin;Fluidtherapy;Contrast Bath;Manual techniques;Functional mobility training;Stair training;Moist Heat;Traction;Ultrasound;Iontophoresis 4mg /ml Dexamethasone;Gait training;DME Instruction;Balance training;Wheelchair mobility training;Neuromuscular re-education;Scar mobilization;Passive range of motion;Spinal Manipulations;Dry needling;Energy conservation;Splinting;Taping;Vasopneumatic Device;Joint Manipulations    PT Next Visit Plan  Continue TKE exercises and stretch for end range.  Progress functional strenghtening wiht additional step ups, squats and static balance training next session.    PT Home Exercise Plan  10/05/19: quad set, SLR, heel slide, 4/30: bridges; 10/13/19: heel prop wiht weight on thigh  Patient will benefit from skilled therapeutic intervention in order to improve the following deficits and impairments:  Abnormal gait, Pain, Increased fascial restricitons, Decreased mobility, Decreased scar mobility, Decreased activity tolerance, Decreased endurance, Decreased range of motion, Decreased strength, Hypomobility, Decreased balance, Difficulty walking, Increased edema, Impaired flexibility  Visit Diagnosis: Other symptoms and signs involving the musculoskeletal  system  Other abnormalities of gait and mobility  Left knee pain, unspecified chronicity     Problem List Patient Active Problem List   Diagnosis Date Noted  . PTSD (post-traumatic stress disorder) 09/01/2019  . GERD (gastroesophageal reflux disease) 09/01/2019  . Primary osteoarthritis of left knee 09/01/2019  . Grade II hemorrhoids 07/03/2019  . Hemorrhoids 05/06/2019  . History of colonic polyps 05/06/2019  . Cervical spondylosis with myelopathy and radiculopathy 12/24/2017  . Coronary artery disease involving native coronary artery of native heart without angina pectoris 12/23/2017  . Hypercholesterolemia 12/23/2017  . Mild obesity 09/20/2016  . Edema of both legs 01/17/2013  . Long term current use of anticoagulant therapy 12/30/2012  . History of deep vein thrombosis (DVT) of lower extremity 12/26/2012  . Dyslipidemia 12/26/2012  . Coronary atherosclerosis without stenosis 12/26/2012   Ihor Austin, LPTA/CLT; CBIS [phone]  Aldona Lento 10/13/2019, 4:04 PM  [place] [address] [place], [place], [address] Phone: [phone]   Fax:  [phone]  Name: Casey Castillo MRN: [id number] Date of Birth: 1945-10-22

## 2019-10-15 ENCOUNTER — Encounter (HOSPITAL_COMMUNITY): Payer: Self-pay

## 2019-10-15 ENCOUNTER — Other Ambulatory Visit: Payer: Self-pay

## 2019-10-15 ENCOUNTER — Ambulatory Visit (HOSPITAL_COMMUNITY): Payer: PPO

## 2019-10-15 DIAGNOSIS — R2689 Other abnormalities of gait and mobility: Secondary | ICD-10-CM

## 2019-10-15 DIAGNOSIS — M25562 Pain in left knee: Secondary | ICD-10-CM

## 2019-10-15 DIAGNOSIS — R29898 Other symptoms and signs involving the musculoskeletal system: Secondary | ICD-10-CM

## 2019-10-15 NOTE — Therapy (Signed)
[place], [place], [address] Phone: [phone]   Fax:  [phone]  Physical Therapy Treatment  Patient Details  Name: Casey Castillo MRN: [id number] Date of Birth: 1945/11/30 Referring Provider (PT): Edmonia Lynch MD    Encounter Date: 10/15/2019  PT End of Session - 10/15/19 1403    Visit Number  5    Number of Visits  12    Date for PT Re-Evaluation  11/20/19    Authorization Type  [employer]; no auth req, no V.L.    Progress Note Due on Visit  10    PT Start Time  1400   3' on bike, not included with charges   PT Stop Time  1446    PT Time Calculation (min)  46 min    Activity Tolerance  Patient tolerated treatment well    Behavior During Therapy  [facility] for tasks assessed/performed       Past Medical History:  Diagnosis Date  . Arthritis   . Cervical myelopathy with cervical radiculopathy   . Claustrophobia   . Coronary artery disease    5%blockage on left anterior descending Dr. Sallyanne Kuster   . Diverticulitis    hx of  . Enlarged prostate    takes flomax  . GERD (gastroesophageal reflux disease)   . History of blood clots    november, 5 1995- left leg; 2014 - left calf  . History of kidney stones   . Hyperlipidemia    primary Dr. Selena Batten  . Pneumonia    as a child  16 YEARS OLD  . PTSD (post-traumatic stress disorder)   . PTSD (post-traumatic stress disorder)   . Seasonal allergies   . Urinary frequency    hx of     Past Surgical History:  Procedure Laterality Date  . ANTERIOR CERVICAL DECOMP/DISCECTOMY FUSION  01/28/2012   Procedure: ANTERIOR CERVICAL DECOMPRESSION/DISCECTOMY FUSION 2 LEVELS;  Surgeon: Kristeen Miss, MD;  Location: [place] [facility];  Service: Neurosurgery;  Laterality: N/A;  Cervical six-seven, cervical seven-thoracic one Anterior cervical decompression/diskectomy/fusion  . ANTERIOR CERVICAL DECOMP/DISCECTOMY FUSION N/A 12/24/2017   Procedure: ANTERIOR CERVICAL  DECOMPRESSION/DISCECTOMY FUSION CERVICAL FOUR-FIVE/CERVICAL FIVE-SIX TWO LEVELS;  Surgeon: Kristeen Miss, MD;  Location: [place];  Service: Neurosurgery;  Laterality: N/A;  ANTERIOR CERVICAL DECOMPRESSION/DISCECTOMY FUSION CERVICAL FOUR-FIVE/CERVICAL FIVE-SIX TWO LEVELS  . CARDIAC CATHETERIZATION     2006  . CARDIOVASCULAR STRESS TEST     2006 and 2008 at [facility] heart and vascular, 2011 myoview and echo  . COLONOSCOPY    . COLONOSCOPY N/A 08/30/2015   Dr. Arnoldo Morale: one 20 mm polyp in proximal ascending colon, removed piecemeal using hot snare. Resected and retrieved. Diverticulosis in sigmoid colon. Path with tubular adenoma, no high grade dysplasia. 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 COLONOSCOPY N/A 08/26/2019   Procedure: COLONOSCOPY;  Surgeon: Daneil Dolin, MD;  [facility];  Service: Endoscopy;  Laterality: N/A;  9:00  . engrown  toenail Bilateral   . EYE SURGERY     Laser for torn retina  . HEMORRHOID SURGERY     sept 8, 2008  . KIDNEY STONE SURGERY     lithotripsy 1980's  . KNEE SURGERY     left 04-11-94 ( has had two surgeries to left knee)  . NASAL SINUS SURGERY     Jun 30 1998  . NM MYOVIEW LTD  07/20/2009   no ischemia  . PILONIDAL CYST /  SINUS EXCISION     surgery 01-08-74  . POLYPECTOMY  08/26/2019   Procedure: POLYPECTOMY;  Surgeon: Daneil Dolin, MD;  [facility];  Service: Endoscopy;;  . RADIOLOGY WITH ANESTHESIA N/A 06/12/2018   Procedure: MRI OF CHEST WITH AND WITHOUT CONTRAST;  Surgeon: Radiologist, Medication, MD;  Location: [place];  Service: Radiology;  Laterality: N/A;  . SHOULDER SURGERY     right 12-20-2003  . TOTAL KNEE ARTHROPLASTY Left 09/15/2019   Procedure: TOTAL KNEE ARTHROPLASTY;  Surgeon: Renette Butters, MD;  [facility];  Service: Orthopedics;  Laterality: Left;  . TRIGGER FINGER RELEASE     03-21-98 right hand ring finger  . US ECHOCARDIOGRAPHY  07/20/2009   LA & RA mildly dilated,trace  MR,TR.    There were no vitals filed for this visit.  Subjective Assessment - 10/15/19 1402    Subjective  Pt stated he continues to have stiffness in knee.  Most difficulty with stiffness and sharp pain medial aspect of knee, increases at night.    Limitations  Sitting;Lifting;Standing;Walking;House hold activities    Patient Stated Goals  Get back to where I'm 100 %    Currently in Pain?  Yes    Pain Score  2     Pain Location  Knee    Pain Orientation  Left    Pain Descriptors / Indicators  Tightness;Sharp    Pain Type  Surgical pain    Pain Onset  1 to 4 weeks ago    Pain Frequency  Intermittent    Aggravating Factors   standing, bending, walking, prolonged positions    Pain Relieving Factors  rest, ice    Effect of Pain on Daily Activities  limits                       [facility] Adult PT Treatment/Exercise - 10/15/19 0001      Exercises   Exercises  Knee/Hip      Knee/Hip Exercises: Stretches   Active Hamstring Stretch  3 reps;30 seconds    Active Hamstring Stretch Limitations  12in step height    Knee: Self-Stretch to increase Flexion  5 reps;10 seconds    Knee: Self-Stretch Limitations  knee drive on [id number] step height    Gastroc Stretch  3 reps;30 seconds    Gastroc Stretch Limitations  slant board      Knee/Hip Exercises: Aerobic   Stationary Bike  full revolution x 3' seat 14      Knee/Hip Exercises: Machines for Strengthening   Cybex Leg Press  30# 2x 10      Knee/Hip Exercises: Standing   Heel Raises  15 reps    Heel Raises Limitations  toe walking 2RT down blue line    Terminal Knee Extension  15 reps;Theraband    Theraband Level (Terminal Knee Extension)  Level 2 (Red)    Terminal Knee Extension Limitations  Instructed against wall with towel at home    Lateral Step Up  Left;15 reps;Hand Hold: 2;Step Height: 4"    Forward Step Up  Left;Hand Hold: 0;Step Height: 6";15 reps    Forward Step Up Limitations  slow and controled    Functional Squat   10 reps      Knee/Hip Exercises: Supine   Terminal Knee Extension  10 reps;Left    Knee Extension  AROM    Knee Extension Limitations  53    Knee Flexion  AROM    Other Supine Knee/Hip Exercises  117               PT Short Term Goals - 10/05/19 1829      PT SHORT TERM GOAL #1   Title  Patient will be independent with initial HEP to improve functional outcomes    Time  3    Period  Weeks    Status  New    Target Date  10/30/19        PT Long Term Goals - 10/05/19 1829      PT LONG TERM GOAL #1   Title  Patient will improve FOTO score to <25% to indicate improvement in functional outcomes    Time  6    Period  Weeks    Status  New    Target Date  11/20/19      PT LONG TERM GOAL #2   Title  Patient will have LT knee AROM 0-120 degrees to improve functional mobility and facilitate squatting to pick up items from floor.    Time  6    Period  Weeks    Status  New    Target Date  11/20/19      PT LONG TERM GOAL #3   Title  Patient will report at least 75% overall improvement in subjective complaint to indicate improvement in ability to perform ADLs.    Time  6    Period  Weeks    Status  New    Target Date  11/20/19      PT LONG TERM GOAL #4   Title  Patient will be able to maintain tandem stance >30 seconds on BLEs to improve stability and reduce risk for falls    Time  6    Period  Weeks    Status  New    Target Date  11/20/19      PT LONG TERM GOAL #5   Title  Patient will have equal to or > 4+/5 MMT throughout BLE to improve ability to perform functional mobility, stair ambulation and ADLs.    Time  6    Period  Weeks    Status  New    Target Date  11/20/19            Plan - 10/15/19 1448    Clinical Impression Statement  Progressed functional strengthening and continued with knee mobility exercises.  Added steps up and leg press for LE strengthening.  Added standing TKE to HEP.  No reports of increased pain though session, was limited by  fatigue with activities.  AROM at EOS 5-117 degrees.  Noted some irritated spots on leg, pt stated knee was becoming itchy.  Strongly advised not to scratch and to apply lotion to reduce itch.    Examination-Activity Limitations  Bathing;Lift;Stand;Locomotion Level;Bend;Transfers;Carry;Sleep;Dressing;Squat;Stairs;Hygiene/Grooming;Sit    Examination-Participation Restrictions  Yard Work;Cleaning;Laundry;Volunteer;Driving;Community Activity    Clinical Decision Making  Low    Rehab Potential  Good    PT Frequency  2x / week    PT Duration  6 weeks    PT Treatment/Interventions  ADLs/Self Care Home Management;Aquatic Therapy;Biofeedback;Cryotherapy;Electrical Stimulation;Therapeutic exercise;Orthotic Fit/Training;Compression bandaging;Manual lymph drainage;Patient/family education;Therapeutic activities;Parrafin;Fluidtherapy;Contrast Bath;Manual techniques;Functional mobility training;Stair training;Moist Heat;Traction;Ultrasound;Iontophoresis 4mg /ml Dexamethasone;Gait training;DME Instruction;Balance training;Wheelchair mobility training;Neuromuscular re-education;Scar mobilization;Passive range of motion;Spinal Manipulations;Dry needling;Energy conservation;Splinting;Taping;Vasopneumatic Device;Joint Manipulations    PT Next Visit Plan  Continue TKE exercises and stretch for end range.  Progress functional strenghtening wiht additional step ups, squats and static balance training next session.    PT Home Exercise Plan  10/05/19: quad set, SLR, heel slide, 4/30: bridges; 10/13/19: heel prop wiht weight on thigh; 10/15/19: standing TKE       Patient will benefit from skilled therapeutic intervention in order to improve the following deficits and impairments:  Abnormal gait, Pain, Increased fascial restricitons, Decreased mobility, Decreased scar mobility, Decreased activity tolerance, Decreased endurance, Decreased range of motion, Decreased strength, Hypomobility, Decreased balance, Difficulty walking,  Increased edema, Impaired flexibility  Visit Diagnosis: Other symptoms and signs involving the musculoskeletal system  Other abnormalities of gait and mobility  Left knee pain, unspecified chronicity     Problem List Patient Active Problem List   Diagnosis Date Noted  . PTSD (post-traumatic stress disorder) 09/01/2019  . GERD (gastroesophageal reflux disease) 09/01/2019  . Primary osteoarthritis of left knee 09/01/2019  . Grade II hemorrhoids 07/03/2019  . Hemorrhoids 05/06/2019  . History of colonic polyps 05/06/2019  . Cervical spondylosis with myelopathy and radiculopathy 12/24/2017  . Coronary artery disease involving native coronary artery of native heart without angina pectoris 12/23/2017  . Hypercholesterolemia 12/23/2017  . Mild obesity 09/20/2016  . Edema of both legs 01/17/2013  . Long term current use of anticoagulant therapy 12/30/2012  . History of deep vein thrombosis (DVT) of lower extremity 12/26/2012  . Dyslipidemia 12/26/2012  . Coronary atherosclerosis without stenosis 12/26/2012   Casey Castillo, LPTA/CLT; CBIS [phone]  Casey Castillo 10/15/2019, 2:54 PM  [facility] [address] [place], [place], [address] Phone: [phone]   Fax:  [phone]  Name: Casey Castillo MRN: [id number] Date of Birth: Jul 11, 1945

## 2019-10-15 NOTE — Patient Instructions (Addendum)
Quad Sets    Squeeze pelvic floor and hold. Tighten top of left thigh.  Hold for 5 seconds. Repeat 10 times. Do 3 times a day.   Copyright  VHI. All rights reserved.   Heel Slides    Squeeze pelvic floor and hold. Slide left heel along bed towards bottom.  Hold for 5-10 seconds. Slide back to flat knee position. Repeat 10 times. Do 2 times a day.  Copyright  VHI. All rights reserved.   Bridging    Slowly raise buttocks from floor, keeping stomach tight. Repeat 10 times per set. Do 2 sets per session. Do 2 sessions per day.  http://orth.exer.us/1096   Copyright  VHI. All rights reserved.   Sitting on chair you may prop up ankle on support.  Place weight on thigh and hold for 5 minutes, increased time as able.    Closed Chain Knee Extension    Stand tall with you back towards the wall and Lt heel against wall.  Place towel behind knee.  Extend knee and hold for 5" holds then slightly flex for relation. Complete 10x 5" holds.  Copyright  VHI. All rights reserved.

## 2019-10-16 ENCOUNTER — Other Ambulatory Visit: Payer: Self-pay

## 2019-10-16 DIAGNOSIS — N3281 Overactive bladder: Secondary | ICD-10-CM

## 2019-10-16 MED ORDER — TAMSULOSIN HCL 0.4 MG PO CAPS: 0.4000 mg | ORAL_CAPSULE | Freq: Two times a day (BID) | ORAL | 3 refills | Status: DC

## 2019-10-19 ENCOUNTER — Other Ambulatory Visit: Payer: Self-pay

## 2019-10-19 ENCOUNTER — Ambulatory Visit (HOSPITAL_COMMUNITY): Payer: PPO | Admitting: Physical Therapy

## 2019-10-19 DIAGNOSIS — R29898 Other symptoms and signs involving the musculoskeletal system: Secondary | ICD-10-CM | POA: Diagnosis not present

## 2019-10-19 DIAGNOSIS — L820 Inflamed seborrheic keratosis: Secondary | ICD-10-CM | POA: Diagnosis not present

## 2019-10-19 DIAGNOSIS — M25562 Pain in left knee: Secondary | ICD-10-CM

## 2019-10-19 DIAGNOSIS — L258 Unspecified contact dermatitis due to other agents: Secondary | ICD-10-CM | POA: Diagnosis not present

## 2019-10-19 DIAGNOSIS — R2689 Other abnormalities of gait and mobility: Secondary | ICD-10-CM

## 2019-10-19 DIAGNOSIS — L218 Other seborrheic dermatitis: Secondary | ICD-10-CM | POA: Diagnosis not present

## 2019-10-19 NOTE — Therapy (Addendum)
[name], [place], [address] Phone: [phone]   Fax:  [phone]  Physical Therapy Treatment  Patient Details  Name: Casey Castillo MRN: [id number] Date of Birth: 03/04/1946 Referring Provider (PT): Edmonia Lynch MD    Encounter Date: 10/19/2019  PT End of Session - 10/19/19 1456    Visit Number  6    Number of Visits  12    Date for PT Re-Evaluation  11/20/19    Authorization Type  [employer]; no auth req, no V.L.    Progress Note Due on Visit  10    PT Start Time  1408    PT Stop Time  1456    PT Time Calculation (min)  48 min    Activity Tolerance  Patient tolerated treatment well    Behavior During Therapy  WFL for tasks assessed/performed       Past Medical History:  Diagnosis Date  . Arthritis   . Cervical myelopathy with cervical radiculopathy   . Claustrophobia   . Coronary artery disease    5%blockage on left anterior descending Dr. Sallyanne Kuster   . Diverticulitis    hx of  . Enlarged prostate    takes flomax  . GERD (gastroesophageal reflux disease)   . History of blood clots    november, 5 1995- left leg; 2014 - left calf  . History of kidney stones   . Hyperlipidemia    primary Dr. Selena Batten  . Pneumonia    as a child  74 YEARS OLD  . PTSD (post-traumatic stress disorder)   . PTSD (post-traumatic stress disorder)   . Seasonal allergies   . Urinary frequency    hx of     Past Surgical History:  Procedure Laterality Date  . ANTERIOR CERVICAL DECOMP/DISCECTOMY FUSION  01/28/2012   Procedure: ANTERIOR CERVICAL DECOMPRESSION/DISCECTOMY FUSION 2 LEVELS;  Surgeon: Kristeen Miss, MD;  Location: [place] [facility];  Service: Neurosurgery;  Laterality: N/A;  Cervical six-seven, cervical seven-thoracic one Anterior cervical decompression/diskectomy/fusion  . ANTERIOR CERVICAL DECOMP/DISCECTOMY FUSION N/A 12/24/2017   Procedure: ANTERIOR CERVICAL DECOMPRESSION/DISCECTOMY FUSION CERVICAL  FOUR-FIVE/CERVICAL FIVE-SIX TWO LEVELS;  Surgeon: Kristeen Miss, MD;  Location: [facility];  Service: Neurosurgery;  Laterality: N/A;  ANTERIOR CERVICAL DECOMPRESSION/DISCECTOMY FUSION CERVICAL FOUR-FIVE/CERVICAL FIVE-SIX TWO LEVELS  . CARDIAC CATHETERIZATION     2006  . CARDIOVASCULAR STRESS TEST     2006 and 2008 at [facility] heart [facility] vascular, 2011 myoview and echo  . COLONOSCOPY    . COLONOSCOPY N/A 08/30/2015   Dr. Arnoldo Morale: one 20 mm polyp in proximal ascending colon, removed piecemeal using hot snare. Resected and retrieved. Diverticulosis in sigmoid colon. Path with tubular adenoma, no high grade dysplasia. 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 COLONOSCOPY N/A 08/26/2019   Procedure: COLONOSCOPY;  Surgeon: Daneil Dolin, MD;  [facility];  Service: Endoscopy;  Laterality: N/A;  9:00  . engrown  toenail Bilateral   . EYE SURGERY     Laser for torn retina  . HEMORRHOID SURGERY     sept 8, 2008  . KIDNEY STONE SURGERY     lithotripsy 1980's  . KNEE SURGERY     left 04-11-94 ( has had two surgeries to left knee)  . NASAL SINUS SURGERY     Jun 30 1998  . NM MYOVIEW LTD  07/20/2009   no ischemia  . PILONIDAL CYST / SINUS EXCISION     surgery  01-08-74  . POLYPECTOMY  08/26/2019   Procedure: POLYPECTOMY;  Surgeon: Daneil Dolin, MD;  [facility];  Service: Endoscopy;;  . RADIOLOGY WITH ANESTHESIA N/A 06/12/2018   Procedure: MRI OF CHEST WITH AND WITHOUT CONTRAST;  Surgeon: Radiologist, Medication, MD;  Location: [facility];  Service: Radiology;  Laterality: N/A;  . SHOULDER SURGERY     right 12-20-2003  . TOTAL KNEE ARTHROPLASTY Left 09/15/2019   Procedure: TOTAL KNEE ARTHROPLASTY;  Surgeon: Renette Butters, MD;  Location: [facility];  Service: Orthopedics;  Laterality: Left;  . TRIGGER FINGER RELEASE     03-21-98 right hand ring finger  . US ECHOCARDIOGRAPHY  07/20/2009   LA & RA mildly dilated,trace MR,TR.    There were no vitals filed for  this visit.  Subjective Assessment - 10/19/19 1412    Subjective  pt states he's really having more stiffness but does have some jabbing pains at medial knee from time to time, varies 1-6/10    Currently in Pain?  Yes                       [facility] Adult PT Treatment/Exercise - 10/19/19 0001      Knee/Hip Exercises: Stretches   Active Hamstring Stretch  3 reps;30 seconds    Active Hamstring Stretch Limitations  12in step height    Knee: Self-Stretch to increase Flexion  10 seconds    Knee: Self-Stretch Limitations  knee drive on [id number] step height 10 reps    Gastroc Stretch  3 reps;30 seconds    Gastroc Stretch Limitations  slant board      Knee/Hip Exercises: Aerobic   Stationary Bike  full revolution x 3' seat 13      Knee/Hip Exercises: Machines for Strengthening   Cybex Leg Press  30# 20x      Knee/Hip Exercises: Standing   Heel Raises  15 reps    Lateral Step Up  Left;15 reps;Hand Hold: 2;Step Height: 4"    Forward Step Up  Left;Hand Hold: 0;Step Height: 6";15 reps    Forward Step Up Limitations  slow and controled    Step Down  Left;15 reps;Hand Hold: 1;Step Height: 4"    Functional Squat  15 reps    Functional Squat Limitations  with cues    SLS  bilateral max of 5" eeither without UE    Other Standing Knee Exercises  vectors 5X5'    Other Standing Knee Exercises  tandem 3x30" each      Knee/Hip Exercises: Supine   Knee Extension  AROM    Knee Extension Limitations  5    Knee Flexion  AROM    Knee Flexion Limitations  118               PT Short Term Goals - 10/05/19 1829      PT SHORT TERM GOAL #1   Title  Patient will be independent with initial HEP to improve functional outcomes    Time  3    Period  Weeks    Status  New    Target Date  10/30/19        PT Long Term Goals - 10/05/19 1829      PT LONG TERM GOAL #1   Title  Patient will improve FOTO score to <25% to indicate improvement in functional outcomes    Time  6    Period   Weeks    Status  New    Target  Date  11/20/19      PT LONG TERM GOAL #2   Title  Patient will have LT knee AROM 0-120 degrees to improve functional mobility and facilitate squatting to pick up items from floor.    Time  6    Period  Weeks    Status  New    Target Date  11/20/19      PT LONG TERM GOAL #3   Title  Patient will report at least 75% overall improvement in subjective complaint to indicate improvement in ability to perform ADLs.    Time  6    Period  Weeks    Status  New    Target Date  11/20/19      PT LONG TERM GOAL #4   Title  Patient will be able to maintain tandem stance >30 seconds on BLEs to improve stability and reduce risk for falls    Time  6    Period  Weeks    Status  New    Target Date  11/20/19      PT LONG TERM GOAL #5   Title  Patient will have equal to or > 4+/5 MMT throughout BLE to improve ability to perform functional mobility, stair ambulation and ADLs.    Time  6    Period  Weeks    Status  New    Target Date  11/20/19            Plan - 10/19/19 1509    Clinical Impression Statement  Continued wtih LE strengthening with addition of single leg balance and stability exercises.  Max stance time without UE 5 seconds on either LE.  Vectors added to help improve this.  Forward step downs added to work on eccentric strengthening.  ROM improved to 118 flexion at end of session.    Examination-Activity Limitations  Bathing;Lift;Stand;Locomotion Level;Bend;Transfers;Carry;Sleep;Dressing;Squat;Stairs;Hygiene/Grooming;Sit    Examination-Participation Restrictions  Yard Work;Cleaning;Laundry;Volunteer;Driving;Community Activity    Rehab Potential  Good    PT Frequency  2x / week    PT Duration  6 weeks    PT Treatment/Interventions  ADLs/Self Care Home Management;Aquatic Therapy;Biofeedback;Cryotherapy;Electrical Stimulation;Therapeutic exercise;Orthotic Fit/Training;Compression bandaging;Manual lymph drainage;Patient/family education;Therapeutic  activities;Parrafin;Fluidtherapy;Contrast Bath;Manual techniques;Functional mobility training;Stair training;Moist Heat;Traction;Ultrasound;Iontophoresis 4mg /ml Dexamethasone;Gait training;DME Instruction;Balance training;Wheelchair mobility training;Neuromuscular re-education;Scar mobilization;Passive range of motion;Spinal Manipulations;Dry needling;Energy conservation;Splinting;Taping;Vasopneumatic Device;Joint Manipulations    PT Next Visit Plan  Continue TKE exercises and stretch for end range.  Progress functional strenghtening and balance.    PT Home Exercise Plan  10/05/19: quad set, SLR, heel slide, 4/30: bridges; 10/13/19: heel prop wiht weight on thigh; 10/15/19: standing TKE       Patient will benefit from skilled therapeutic intervention in order to improve the following deficits and impairments:  Abnormal gait, Pain, Increased fascial restricitons, Decreased mobility, Decreased scar mobility, Decreased activity tolerance, Decreased endurance, Decreased range of motion, Decreased strength, Hypomobility, Decreased balance, Difficulty walking, Increased edema, Impaired flexibility  Visit Diagnosis: Other symptoms and signs involving the musculoskeletal system  Other abnormalities of gait and mobility  Left knee pain, unspecified chronicity     Problem List Patient Active Problem List   Diagnosis Date Noted  . PTSD (post-traumatic stress disorder) 09/01/2019  . GERD (gastroesophageal reflux disease) 09/01/2019  . Primary osteoarthritis of left knee 09/01/2019  . Grade II hemorrhoids 07/03/2019  . Hemorrhoids 05/06/2019  . History of colonic polyps 05/06/2019  . Cervical spondylosis with myelopathy and radiculopathy 12/24/2017  . Coronary artery disease involving native coronary artery of native heart without  angina pectoris 12/23/2017  . Hypercholesterolemia 12/23/2017  . Mild obesity 09/20/2016  . Edema of both legs 01/17/2013  . Long term current use of anticoagulant therapy  12/30/2012  . History of deep vein thrombosis (DVT) of lower extremity 12/26/2012  . Dyslipidemia 12/26/2012  . Coronary atherosclerosis without stenosis 12/26/2012   Teena Irani, PTA/CLT [phone]  Teena Irani 10/19/2019, 3:27 PM  [facility] [address] [place], [place], [address] Phone: [phone]   Fax:  [phone]  Name: Casey Castillo MRN: [id number] Date of Birth: [birth year]

## 2019-10-21 ENCOUNTER — Ambulatory Visit (HOSPITAL_COMMUNITY): Payer: PPO | Admitting: Physical Therapy

## 2019-10-21 ENCOUNTER — Encounter (HOSPITAL_COMMUNITY): Payer: Self-pay | Admitting: Physical Therapy

## 2019-10-21 ENCOUNTER — Other Ambulatory Visit: Payer: Self-pay

## 2019-10-21 DIAGNOSIS — M25562 Pain in left knee: Secondary | ICD-10-CM

## 2019-10-21 DIAGNOSIS — R29898 Other symptoms and signs involving the musculoskeletal system: Secondary | ICD-10-CM

## 2019-10-21 DIAGNOSIS — R2689 Other abnormalities of gait and mobility: Secondary | ICD-10-CM

## 2019-10-21 NOTE — Therapy (Signed)
[place], [place], [address] Phone: [phone]   Fax:  [phone]  Physical Therapy Treatment  Patient Details  Name: Casey Castillo MRN: [id number] Date of Birth: [birth year] Referring Provider (PT): Edmonia Lynch MD    Encounter Date: 10/21/2019  PT End of Session - 10/21/19 1346    Visit Number  7    Number of Visits  12    Date for PT Re-Evaluation  11/20/19    Authorization Type  [employer]; no auth req, no V.L.    Progress Note Due on Visit  10    PT Start Time  1347    PT Stop Time  1430    PT Time Calculation (min)  43 min    Activity Tolerance  Patient tolerated treatment well    Behavior During Therapy  WFL for tasks assessed/performed       Past Medical History:  Diagnosis Date  . Arthritis   . Cervical myelopathy with cervical radiculopathy   . Claustrophobia   . Coronary artery disease    5%blockage on left anterior descending Dr. Sallyanne Kuster   . Diverticulitis    hx of  . Enlarged prostate    takes flomax  . GERD (gastroesophageal reflux disease)   . History of blood clots    november, 5 1995- left leg; 2014 - left calf  . History of kidney stones   . Hyperlipidemia    primary Dr. Selena Batten  . Pneumonia    as a child  62 YEARS OLD  . PTSD (post-traumatic stress disorder)   . PTSD (post-traumatic stress disorder)   . Seasonal allergies   . Urinary frequency    hx of     Past Surgical History:  Procedure Laterality Date  . ANTERIOR CERVICAL DECOMP/DISCECTOMY FUSION  01/28/2012   Procedure: ANTERIOR CERVICAL DECOMPRESSION/DISCECTOMY FUSION 2 LEVELS;  Surgeon: Kristeen Miss, MD;  Location: [place] [facility];  Service: Neurosurgery;  Laterality: N/A;  Cervical six-seven, cervical seven-thoracic one Anterior cervical decompression/diskectomy/fusion  . ANTERIOR CERVICAL DECOMP/DISCECTOMY FUSION N/A 12/24/2017   Procedure: ANTERIOR CERVICAL DECOMPRESSION/DISCECTOMY FUSION CERVICAL  FOUR-FIVE/CERVICAL FIVE-SIX TWO LEVELS;  Surgeon: Kristeen Miss, MD;  Location: [place];  Service: Neurosurgery;  Laterality: N/A;  ANTERIOR CERVICAL DECOMPRESSION/DISCECTOMY FUSION CERVICAL FOUR-FIVE/CERVICAL FIVE-SIX TWO LEVELS  . CARDIAC CATHETERIZATION     2006  . CARDIOVASCULAR STRESS TEST     2006 and 2008 at [facility] heart and vascular, 2011 myoview and echo  . COLONOSCOPY    . COLONOSCOPY N/A 08/30/2015   Dr. Arnoldo Morale: one 20 mm polyp in proximal ascending colon, removed piecemeal using hot snare. Resected and retrieved. Diverticulosis in sigmoid colon. Path with tubular adenoma, no high grade dysplasia. 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 COLONOSCOPY N/A 08/26/2019   Procedure: COLONOSCOPY;  Surgeon: Daneil Dolin, MD;  [facility];  Service: Endoscopy;  Laterality: N/A;  9:00  . engrown  toenail Bilateral   . EYE SURGERY     Laser for torn retina  . HEMORRHOID SURGERY     sept 8, 2008  . KIDNEY STONE SURGERY     lithotripsy 1980's  . KNEE SURGERY     left 04-11-94 ( has had two surgeries to left knee)  . NASAL SINUS SURGERY     Jun 30 1998  . NM MYOVIEW LTD  07/20/2009   no ischemia  . PILONIDAL CYST / SINUS EXCISION     surgery  01-08-74  . POLYPECTOMY  08/26/2019   Procedure: POLYPECTOMY;  Surgeon: Daneil Dolin, MD;  [facility];  Service: Endoscopy;;  . RADIOLOGY WITH ANESTHESIA N/A 06/12/2018   Procedure: MRI OF CHEST WITH AND WITHOUT CONTRAST;  Surgeon: Radiologist, Medication, MD;  Location: [facility];  Service: Radiology;  Laterality: N/A;  . SHOULDER SURGERY     right 12-20-2003  . TOTAL KNEE ARTHROPLASTY Left 09/15/2019   Procedure: TOTAL KNEE ARTHROPLASTY;  Surgeon: Renette Butters, MD;  [facility];  Service: Orthopedics;  Laterality: Left;  . TRIGGER FINGER RELEASE     03-21-98 right hand ring finger  . US ECHOCARDIOGRAPHY  07/20/2009   LA & RA mildly dilated,trace MR,TR.    There were no vitals filed for  this visit.  Subjective Assessment - 10/21/19 1351    Subjective  Patient says his ROM is improving, says he still can't do what he wants (shag dancing). Says there is still some pain and swelling.    Limitations  Sitting;Lifting;Standing;Walking;House hold activities    Patient Stated Goals  Get back to where I'm 100 %    Currently in Pain?  Yes    Pain Score  2     Pain Location  Knee    Pain Orientation  Left;Medial    Pain Descriptors / Indicators  Aching    Pain Type  Surgical pain    Pain Onset  1 to 4 weeks ago    Pain Frequency  Intermittent                        [facility] Adult PT Treatment/Exercise - 10/21/19 0001      Knee/Hip Exercises: Stretches   Knee: Self-Stretch to increase Flexion  10 seconds    Knee: Self-Stretch Limitations  knee drive on [id number] step height 10 reps    Gastroc Stretch  3 reps;30 seconds    Gastroc Stretch Limitations  slant board      Knee/Hip Exercises: Aerobic   Stationary Bike  full revolution x 4' seat 13      Knee/Hip Exercises: Machines for Strengthening   Cybex Leg Press  40# 20x      Knee/Hip Exercises: Standing   Heel Raises  20 reps    Knee Flexion  Both;1 set;15 reps    Knee Flexion Limitations  2#    Terminal Knee Extension  15 reps;Theraband;Left    Theraband Level (Terminal Knee Extension)  Other (comment)    Terminal Knee Extension Limitations  5 sec hold, purple band     Hip Abduction  Both;1 set;15 reps    Abduction Limitations  2#    Hip Extension  Both;1 set;15 reps    Extension Limitations  2#    Functional Squat  2 sets;10 reps    Functional Squat Limitations  with chair for depth cue     Stairs  7 in 3RT reciprocal gait no hand rail     SLS  3 x 10" solid floor, intermiittent HHA      Knee/Hip Exercises: Supine   Knee Extension  AROM    Knee Extension Limitations  3    Knee Flexion  AROM    Knee Flexion Limitations  113      Knee/Hip Exercises: Prone   Prone Knee Hang  2 minutes;Weights     Prone Knee Hang Weights (lbs)  LT; 2#  PT Short Term Goals - 10/05/19 1829      PT SHORT TERM GOAL #1   Title  Patient will be independent with initial HEP to improve functional outcomes    Time  3    Period  Weeks    Status  New    Target Date  10/30/19        PT Long Term Goals - 10/05/19 1829      PT LONG TERM GOAL #1   Title  Patient will improve FOTO score to <25% to indicate improvement in functional outcomes    Time  6    Period  Weeks    Status  New    Target Date  11/20/19      PT LONG TERM GOAL #2   Title  Patient will have LT knee AROM 0-120 degrees to improve functional mobility and facilitate squatting to pick up items from floor.    Time  6    Period  Weeks    Status  New    Target Date  11/20/19      PT LONG TERM GOAL #3   Title  Patient will report at least 75% overall improvement in subjective complaint to indicate improvement in ability to perform ADLs.    Time  6    Period  Weeks    Status  New    Target Date  11/20/19      PT LONG TERM GOAL #4   Title  Patient will be able to maintain tandem stance >30 seconds on BLEs to improve stability and reduce risk for falls    Time  6    Period  Weeks    Status  New    Target Date  11/20/19      PT LONG TERM GOAL #5   Title  Patient will have equal to or > 4+/5 MMT throughout BLE to improve ability to perform functional mobility, stair ambulation and ADLs.    Time  6    Period  Weeks    Status  New    Target Date  11/20/19            Plan - 10/21/19 1433    Clinical Impression Statement  Patient was well challenged with ther ex progressions today. Patient noted increased muscle fatigue with added 2# weight to standing hip abduction, extension and HS curls. Patient educated on proper form and function of all added ther ex. Added prone hangs for increased knee extension AROM. Patient will continue to benefit from skilled therapy services to progress knee strength and mobility  to reduce pain and improve level of function with ADLs and functional mobility.    Examination-Activity Limitations  Bathing;Lift;Stand;Locomotion Level;Bend;Transfers;Carry;Sleep;Dressing;Squat;Stairs;Hygiene/Grooming;Sit    Examination-Participation Restrictions  Yard Work;Cleaning;Laundry;Volunteer;Driving;Community Activity    Rehab Potential  Good    PT Frequency  2x / week    PT Duration  6 weeks    PT Treatment/Interventions  ADLs/Self Care Home Management;Aquatic Therapy;Biofeedback;Cryotherapy;Electrical Stimulation;Therapeutic exercise;Orthotic Fit/Training;Compression bandaging;Manual lymph drainage;Patient/family education;Therapeutic activities;Parrafin;Fluidtherapy;Contrast Bath;Manual techniques;Functional mobility training;Stair training;Moist Heat;Traction;Ultrasound;Iontophoresis 4mg /ml Dexamethasone;Gait training;DME Instruction;Balance training;Wheelchair mobility training;Neuromuscular re-education;Scar mobilization;Passive range of motion;Spinal Manipulations;Dry needling;Energy conservation;Splinting;Taping;Vasopneumatic Device;Joint Manipulations    PT Next Visit Plan  Progress functional strenghtening and balance. Add foam to static balance    PT Home Exercise Plan  10/05/19: quad set, SLR, heel slide, 4/30: bridges; 10/13/19: heel prop wiht weight on thigh; 10/15/19: standing TKE    Consulted and Agree with Plan of Care  Patient  Patient will benefit from skilled therapeutic intervention in order to improve the following deficits and impairments:  Abnormal gait, Pain, Increased fascial restricitons, Decreased mobility, Decreased scar mobility, Decreased activity tolerance, Decreased endurance, Decreased range of motion, Decreased strength, Hypomobility, Decreased balance, Difficulty walking, Increased edema, Impaired flexibility  Visit Diagnosis: Other symptoms and signs involving the musculoskeletal system  Other abnormalities of gait and mobility  Left knee pain,  unspecified chronicity     Problem List Patient Active Problem List   Diagnosis Date Noted  . PTSD (post-traumatic stress disorder) 09/01/2019  . GERD (gastroesophageal reflux disease) 09/01/2019  . Primary osteoarthritis of left knee 09/01/2019  . Grade II hemorrhoids 07/03/2019  . Hemorrhoids 05/06/2019  . History of colonic polyps 05/06/2019  . Cervical spondylosis with myelopathy and radiculopathy 12/24/2017  . Coronary artery disease involving native coronary artery of native heart without angina pectoris 12/23/2017  . Hypercholesterolemia 12/23/2017  . Mild obesity 09/20/2016  . Edema of both legs 01/17/2013  . Long term current use of anticoagulant therapy 12/30/2012  . History of deep vein thrombosis (DVT) of lower extremity 12/26/2012  . Dyslipidemia 12/26/2012  . Coronary atherosclerosis without stenosis 12/26/2012    2:41 PM, 10/21/19 Josue Hector PT DPT  Physical Therapist with [facility]  [phone]   [facility] [address] [place], [place], [address] Phone: [phone]   Fax:  [phone]  Name: FLYNT SCHRECKENGOST MRN: [id number] Date of Birth: [birth year]

## 2019-10-22 ENCOUNTER — Encounter (HOSPITAL_COMMUNITY): Payer: Self-pay | Admitting: Physical Therapy

## 2019-10-22 ENCOUNTER — Ambulatory Visit (HOSPITAL_COMMUNITY): Payer: PPO | Admitting: Physical Therapy

## 2019-10-22 DIAGNOSIS — R2689 Other abnormalities of gait and mobility: Secondary | ICD-10-CM

## 2019-10-22 DIAGNOSIS — R29898 Other symptoms and signs involving the musculoskeletal system: Secondary | ICD-10-CM

## 2019-10-22 DIAGNOSIS — M25562 Pain in left knee: Secondary | ICD-10-CM

## 2019-10-22 NOTE — Therapy (Signed)
[place], [place], [address] Phone: [phone]   Fax:  [phone]  Physical Therapy Treatment  Patient Details  Name: Casey Castillo MRN: [id number] Date of Birth: May 16, 1946 Referring Provider (PT): Edmonia Lynch MD    Encounter Date: 10/22/2019  PT End of Session - 10/22/19 0811    Visit Number  8    Number of Visits  12    Date for PT Re-Evaluation  11/20/19    Authorization Type  [employer]; no auth req, no V.L.    Progress Note Due on Visit  10    PT Start Time  [id number]    PT Stop Time  0900    PT Time Calculation (min)  43 min    Activity Tolerance  Patient tolerated treatment well    Behavior During Therapy  [facility] for tasks assessed/performed       Past Medical History:  Diagnosis Date  . Arthritis   . Cervical myelopathy with cervical radiculopathy   . Claustrophobia   . Coronary artery disease    5%blockage on left anterior descending Dr. Sallyanne Kuster   . Diverticulitis    hx of  . Enlarged prostate    takes flomax  . GERD (gastroesophageal reflux disease)   . History of blood clots    november, 5 1995- left leg; 2014 - left calf  . History of kidney stones   . Hyperlipidemia    primary Dr. Selena Batten  . Pneumonia    as a child  74 YEARS OLD  . PTSD (post-traumatic stress disorder)   . PTSD (post-traumatic stress disorder)   . Seasonal allergies   . Urinary frequency    hx of     Past Surgical History:  Procedure Laterality Date  . ANTERIOR CERVICAL DECOMP/DISCECTOMY FUSION  01/28/2012   Procedure: ANTERIOR CERVICAL DECOMPRESSION/DISCECTOMY FUSION 2 LEVELS;  Surgeon: Kristeen Miss, MD;  Location: [place] [facility];  Service: Neurosurgery;  Laterality: N/A;  Cervical six-seven, cervical seven-thoracic one Anterior cervical decompression/diskectomy/fusion  . ANTERIOR CERVICAL DECOMP/DISCECTOMY FUSION N/A 12/24/2017   Procedure: ANTERIOR CERVICAL DECOMPRESSION/DISCECTOMY FUSION CERVICAL  FOUR-FIVE/CERVICAL FIVE-SIX TWO LEVELS;  Surgeon: Kristeen Miss, MD;  Location: [place];  Service: Neurosurgery;  Laterality: N/A;  ANTERIOR CERVICAL DECOMPRESSION/DISCECTOMY FUSION CERVICAL FOUR-FIVE/CERVICAL FIVE-SIX TWO LEVELS  . CARDIAC CATHETERIZATION     2006  . CARDIOVASCULAR STRESS TEST     2006 and 2008 at [facility] heart and vascular, 2011 myoview and echo  . COLONOSCOPY    . COLONOSCOPY N/A 08/30/2015   Dr. Arnoldo Morale: one 20 mm polyp in proximal ascending colon, removed piecemeal using hot snare. Resected and retrieved. Diverticulosis in sigmoid colon. Path with tubular adenoma, no high grade dysplasia. 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 COLONOSCOPY N/A 08/26/2019   Procedure: COLONOSCOPY;  Surgeon: Daneil Dolin, MD;  [facility];  Service: Endoscopy;  Laterality: N/A;  9:00  . engrown  toenail Bilateral   . EYE SURGERY     Laser for torn retina  . HEMORRHOID SURGERY     sept 8, 2008  . KIDNEY STONE SURGERY     lithotripsy 1980's  . KNEE SURGERY     left 04-11-94 ( has had two surgeries to left knee)  . NASAL SINUS SURGERY     Jun 30 1998  . NM MYOVIEW LTD  07/20/2009   no ischemia  . PILONIDAL CYST / SINUS EXCISION     surgery  01-08-74  . POLYPECTOMY  08/26/2019   Procedure: POLYPECTOMY;  Surgeon: Daneil Dolin, MD;  [facility];  Service: Endoscopy;;  . RADIOLOGY WITH ANESTHESIA N/A 06/12/2018   Procedure: MRI OF CHEST WITH AND WITHOUT CONTRAST;  Surgeon: Radiologist, Medication, MD;  Location: [place];  Service: Radiology;  Laterality: N/A;  . SHOULDER SURGERY     right 12-20-2003  . TOTAL KNEE ARTHROPLASTY Left 09/15/2019   Procedure: TOTAL KNEE ARTHROPLASTY;  Surgeon: Renette Butters, MD;  [facility]: [facility];  Service: Orthopedics;  Laterality: Left;  . TRIGGER FINGER RELEASE     03-21-98 right hand ring finger  . US ECHOCARDIOGRAPHY  07/20/2009   LA & RA mildly dilated,trace MR,TR.    There were no vitals filed for  this visit.  Subjective Assessment - 10/22/19 0821    Subjective  Patient says knee is a little sore today, not too bad. Feels it on the inside of knee like usual. Says he is a little stiff this AM.    Limitations  Sitting;Lifting;Standing;Walking;House hold activities    Patient Stated Goals  Get back to where I'm 100 %    Currently in Pain?  Yes    Pain Score  2     Pain Location  Knee    Pain Orientation  Left;Medial    Pain Descriptors / Indicators  Aching    Pain Type  Surgical pain    Pain Onset  1 to 4 weeks ago                        [facility] Adult PT Treatment/Exercise - 10/22/19 0001      Knee/Hip Exercises: Stretches   Quad Stretch  3 reps;30 seconds;Left    Quad Stretch Limitations  prone with rope    Knee: Self-Stretch to increase Flexion  10 seconds    Knee: Self-Stretch Limitations  knee drive on [id number] step height 10 reps    Gastroc Stretch  3 reps;30 seconds    Gastroc Stretch Limitations  slant board      Knee/Hip Exercises: Aerobic   Stationary Bike  full revolution x 4' seat 13 Lv 3      Knee/Hip Exercises: Machines for Strengthening   Cybex Leg Press  40# 20x      Knee/Hip Exercises: Standing   Heel Raises  20 reps    Knee Flexion  Both;1 set;15 reps    Knee Flexion Limitations  2#    Hip Abduction  Both;1 set;15 reps    Abduction Limitations  2#    Hip Extension  Both;1 set;15 reps    Extension Limitations  2#    Functional Squat  2 sets;10 reps    Functional Squat Limitations  with chair for depth cue     Stairs  7 in 3RT reciprocal gait no hand rail     SLS  3 x 10" solid floor, intermittent HHA      Knee/Hip Exercises: Supine   Knee Extension  AROM    Knee Extension Limitations  4    Knee Flexion  AROM    Knee Flexion Limitations  115      Knee/Hip Exercises: Prone   Prone Knee Hang  2 minutes;Weights    Prone Knee Hang Weights (lbs)  LT; 2#      Manual Therapy   Manual Therapy  Soft tissue mobilization;Myofascial release     Manual therapy comments  completed seperately from all other skilled interventions  Soft tissue mobilization  STM to LT adductors, medial knee joint     Myofascial Release  Scar tissue message                PT Short Term Goals - 10/05/19 1829      PT SHORT TERM GOAL #1   Title  Patient will be independent with initial HEP to improve functional outcomes    Time  3    Period  Weeks    Status  New    Target Date  10/30/19        PT Long Term Goals - 10/05/19 1829      PT LONG TERM GOAL #1   Title  Patient will improve FOTO score to <25% to indicate improvement in functional outcomes    Time  6    Period  Weeks    Status  New    Target Date  11/20/19      PT LONG TERM GOAL #2   Title  Patient will have LT knee AROM 0-120 degrees to improve functional mobility and facilitate squatting to pick up items from floor.    Time  6    Period  Weeks    Status  New    Target Date  11/20/19      PT LONG TERM GOAL #3   Title  Patient will report at least 75% overall improvement in subjective complaint to indicate improvement in ability to perform ADLs.    Time  6    Period  Weeks    Status  New    Target Date  11/20/19      PT LONG TERM GOAL #4   Title  Patient will be able to maintain tandem stance >30 seconds on BLEs to improve stability and reduce risk for falls    Time  6    Period  Weeks    Status  New    Target Date  11/20/19      PT LONG TERM GOAL #5   Title  Patient will have equal to or > 4+/5 MMT throughout BLE to improve ability to perform functional mobility, stair ambulation and ADLs.    Time  6    Period  Weeks    Status  New    Target Date  11/20/19            Plan - 10/22/19 0908    Clinical Impression Statement  Patient tolerated session well today. Patient with noted muscle fatigue with resisted hip strengthening. Patient shows good return with previously added exercise. Patient showed improved weight shift during functional squatting but  continues to be limited by mild restriction in LT knee ROM. Patient remains challenged with SLS static balance from solid floor. Will progress to compliant surface as able. Patient educated on scar tissue message for improved tissue mobility. Added manual STM and scar message to address pain and restriction in LT knee. Patient will continue to benefit from skilled therapy services to progress knee strength and ROM to reduce pain and improve LOF with ADLs and functional mobility.    Examination-Activity Limitations  Bathing;Lift;Stand;Locomotion Level;Bend;Transfers;Carry;Sleep;Dressing;Squat;Stairs;Hygiene/Grooming;Sit    Examination-Participation Restrictions  Yard Work;Cleaning;Laundry;Volunteer;Driving;Community Activity    Rehab Potential  Good    PT Frequency  2x / week    PT Duration  6 weeks    PT Treatment/Interventions  ADLs/Self Care Home Management;Aquatic Therapy;Biofeedback;Cryotherapy;Electrical Stimulation;Therapeutic exercise;Orthotic Fit/Training;Compression bandaging;Manual lymph drainage;Patient/family education;Therapeutic activities;Parrafin;Fluidtherapy;Contrast Bath;Manual techniques;Functional mobility training;Stair training;Moist Heat;Traction;Ultrasound;Iontophoresis 4mg /ml Dexamethasone;Gait training;DME Instruction;Balance training;Wheelchair  mobility training;Neuromuscular re-education;Scar mobilization;Passive range of motion;Spinal Manipulations;Dry needling;Energy conservation;Splinting;Taping;Vasopneumatic Device;Joint Manipulations    PT Next Visit Plan  Progress functional strenghtening and balance. Add foam to static balance    PT Home Exercise Plan  10/05/19: quad set, SLR, heel slide, 4/30: bridges; 10/13/19: heel prop wiht weight on thigh; 10/15/19: standing TKE    Consulted and Agree with Plan of Care  Patient       Patient will benefit from skilled therapeutic intervention in order to improve the following deficits and impairments:  Abnormal gait, Pain, Increased  fascial restricitons, Decreased mobility, Decreased scar mobility, Decreased activity tolerance, Decreased endurance, Decreased range of motion, Decreased strength, Hypomobility, Decreased balance, Difficulty walking, Increased edema, Impaired flexibility  Visit Diagnosis: Other symptoms and signs involving the musculoskeletal system  Other abnormalities of gait and mobility  Left knee pain, unspecified chronicity     Problem List Patient Active Problem List   Diagnosis Date Noted  . PTSD (post-traumatic stress disorder) 09/01/2019  . GERD (gastroesophageal reflux disease) 09/01/2019  . Primary osteoarthritis of left knee 09/01/2019  . Grade II hemorrhoids 07/03/2019  . Hemorrhoids 05/06/2019  . History of colonic polyps 05/06/2019  . Cervical spondylosis with myelopathy and radiculopathy 12/24/2017  . Coronary artery disease involving native coronary artery of native heart without angina pectoris 12/23/2017  . Hypercholesterolemia 12/23/2017  . Mild obesity 09/20/2016  . Edema of both legs 01/17/2013  . Long term current use of anticoagulant therapy 12/30/2012  . History of deep vein thrombosis (DVT) of lower extremity 12/26/2012  . Dyslipidemia 12/26/2012  . Coronary atherosclerosis without stenosis 12/26/2012   9:20 AM, 10/22/19 Josue Hector PT DPT  Physical Therapist with [facility]  [phone] [place], [place], [address] Phone: [phone]   Fax:  [phone]  Name: Casey Castillo MRN: [id number] Date of Birth: 1945-06-18

## 2019-10-26 ENCOUNTER — Ambulatory Visit (HOSPITAL_COMMUNITY): Payer: PPO | Admitting: Physical Therapy

## 2019-10-26 ENCOUNTER — Encounter (HOSPITAL_COMMUNITY): Payer: Self-pay | Admitting: Physical Therapy

## 2019-10-26 ENCOUNTER — Other Ambulatory Visit: Payer: Self-pay

## 2019-10-26 DIAGNOSIS — R29898 Other symptoms and signs involving the musculoskeletal system: Secondary | ICD-10-CM | POA: Diagnosis not present

## 2019-10-26 DIAGNOSIS — M25562 Pain in left knee: Secondary | ICD-10-CM

## 2019-10-26 DIAGNOSIS — R2689 Other abnormalities of gait and mobility: Secondary | ICD-10-CM

## 2019-10-26 NOTE — Therapy (Signed)
[facility] [address] [place], [place], [address] Phone: [phone]   Fax:  [phone]  Physical Therapy Treatment/ Progress Note  Patient Details  Name: Casey Castillo MRN: [id number] Date of Birth: 04-17-1946 Referring Provider (PT): Edmonia Lynch MD    Encounter Date: 10/26/2019   Progress Note Reporting Period 10/05/19 to 10/26/19  See note below for Objective Data and Assessment of Progress/Goals.       PT End of Session - 10/26/19 1358    Visit Number  9    Number of Visits  12    Date for PT Re-Evaluation  11/20/19    Authorization Type  [employer]; no auth req, no V.L.    Progress Note Due on Visit  12    PT Start Time  1345    PT Stop Time  1430    PT Time Calculation (min)  45 min    Activity Tolerance  Patient tolerated treatment well    Behavior During Therapy  [facility] for tasks assessed/performed       Past Medical History:  Diagnosis Date  . Arthritis   . Cervical myelopathy with cervical radiculopathy   . Claustrophobia   . Coronary artery disease    5%blockage on left anterior descending Dr. Sallyanne Kuster   . Diverticulitis    hx of  . Enlarged prostate    takes flomax  . GERD (gastroesophageal reflux disease)   . History of blood clots    november, 5 1995- left leg; 2014 - left calf  . History of kidney stones   . Hyperlipidemia    primary Dr. Selena Batten  . Pneumonia    as a child  74 YEARS OLD  . PTSD (post-traumatic stress disorder)   . PTSD (post-traumatic stress disorder)   . Seasonal allergies   . Urinary frequency    hx of     Past Surgical History:  Procedure Laterality Date  . ANTERIOR CERVICAL DECOMP/DISCECTOMY FUSION  01/28/2012   Procedure: ANTERIOR CERVICAL DECOMPRESSION/DISCECTOMY FUSION 2 LEVELS;  Surgeon: Kristeen Miss, MD;  Location: [place] [facility];  Service: Neurosurgery;  Laterality: N/A;  Cervical six-seven, cervical seven-thoracic one Anterior cervical  decompression/diskectomy/fusion  . ANTERIOR CERVICAL DECOMP/DISCECTOMY FUSION N/A 12/24/2017   Procedure: ANTERIOR CERVICAL DECOMPRESSION/DISCECTOMY FUSION CERVICAL FOUR-FIVE/CERVICAL FIVE-SIX TWO LEVELS;  Surgeon: Kristeen Miss, MD;  Location: [place];  Service: Neurosurgery;  Laterality: N/A;  ANTERIOR CERVICAL DECOMPRESSION/DISCECTOMY FUSION CERVICAL FOUR-FIVE/CERVICAL FIVE-SIX TWO LEVELS  . CARDIAC CATHETERIZATION     2006  . CARDIOVASCULAR STRESS TEST     2006 and 2008 at [facility] heart and vascular, 2011 myoview and echo  . COLONOSCOPY    . COLONOSCOPY N/A 08/30/2015   Dr. Arnoldo Morale: one 20 mm polyp in proximal ascending colon, removed piecemeal using hot snare. Resected and retrieved. Diverticulosis in sigmoid colon. Path with tubular adenoma, no high grade dysplasia. 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 COLONOSCOPY N/A 08/26/2019   Procedure: COLONOSCOPY;  Surgeon: Daneil Dolin, MD;  [facility];  Service: Endoscopy;  Laterality: N/A;  9:00  . engrown  toenail Bilateral   . EYE SURGERY     Laser for torn retina  . HEMORRHOID SURGERY     sept 8, 2008  . KIDNEY STONE SURGERY     lithotripsy 1980's  . KNEE SURGERY     left 04-11-94 ( has had two surgeries to left knee)  . NASAL SINUS SURGERY  Jun 30 1998  . NM MYOVIEW LTD  07/20/2009   no ischemia  . PILONIDAL CYST / SINUS EXCISION     surgery 01-08-74  . POLYPECTOMY  08/26/2019   Procedure: POLYPECTOMY;  Surgeon: Daneil Dolin, MD;  [facility];  Service: Endoscopy;;  . RADIOLOGY WITH ANESTHESIA N/A 06/12/2018   Procedure: MRI OF CHEST WITH AND WITHOUT CONTRAST;  Surgeon: Radiologist, Medication, MD;  Location: [place];  Service: Radiology;  Laterality: N/A;  . SHOULDER SURGERY     right 12-20-2003  . TOTAL KNEE ARTHROPLASTY Left 09/15/2019   Procedure: TOTAL KNEE ARTHROPLASTY;  Surgeon: Renette Butters, MD;  [facility];  Service: Orthopedics;  Laterality: Left;  .  TRIGGER FINGER RELEASE     03-21-98 right hand ring finger  . US ECHOCARDIOGRAPHY  07/20/2009   LA & RA mildly dilated,trace MR,TR.    There were no vitals filed for this visit.  Subjective Assessment - 10/26/19 1353    Subjective  Patient says he still has pain "at that one spot" at inside of knee. Says otherwise he is doing well. Notes improvement in walking. Overall pain is minimal about 1-2/10, occasionally goes up to 5/10. Says he feels about 75% improvement since starting therapy.    Limitations  Sitting;Lifting;Standing;Walking;House hold activities    How long can you stand comfortably?  no limit    How long can you walk comfortably?  no limit    Patient Stated Goals  Get back to where I'm 100 %    Currently in Pain?  Yes    Pain Score  1     Pain Location  Knee    Pain Orientation  Left;Medial    Pain Descriptors / Indicators  Aching    Pain Type  Surgical pain    Pain Onset  1 to 4 weeks ago    Pain Frequency  Intermittent    Aggravating Factors   bending, coming down stairs, sleeping    Pain Relieving Factors  rest, ice    Effect of Pain on Daily Activities  limits         [facility] PT Assessment - 10/26/19 0001      Assessment   Medical Diagnosis  LT TKA     Referring Provider (PT)  Edmonia Lynch MD     Onset Date/Surgical Date  09/15/19    Next MD Visit  10/30/19    Prior Therapy  Yes in acute and HH therapy       Precautions   Precautions  None      Restrictions   Weight Bearing Restrictions  No      Balance Screen   Has the patient fallen in the past 6 months  No      [place] residence    Living Arrangements  Alone    Type of [place] to enter    Entrance Stairs-Number of Steps  4    Entrance Stairs-Rails  Right    [place]  One level      Prior Function   Level of Independence  Independent      Cognition   Overall Cognitive Status  Within Functional Limits for tasks assessed       Observation/Other Assessments   Observations  incision appears intact and to be well healed     Focus on Therapeutic Outcomes (FOTO)   35% limited  was 41% limited     Sensation   Light Touch  Appears Intact      AROM   Left Knee Extension  4    Left Knee Flexion  120      Strength   Right Hip Flexion  5/5    Right Hip Extension  5/5   was 4+   Right Hip ABduction  5/5    Left Hip Flexion  5/5   was 4+   Left Hip Extension  4+/5   was 4   Left Hip ABduction  4+/5    Right Knee Flexion  5/5    Right Knee Extension  5/5    Left Knee Flexion  4+/5   was 4   Left Knee Extension  4+/5   was 4   Right Ankle Dorsiflexion  5/5    Left Ankle Dorsiflexion  5/5      Ambulation/Gait   Ambulation/Gait  Yes    Ambulation/Gait Assistance  7: Independent    Assistive device  None    Gait Pattern  Decreased stance time - left    Ambulation Surface  Level;Indoor    Stairs  Yes    Stairs Assistance  7: Independent    Stair Management Technique  No rails;Alternating pattern    Number of Stairs  12    Height of Stairs  7      Balance   Balance Assessed  Yes      Static Standing Balance   Static Standing Balance -  Activities   Tandam Stance - Right Leg;Tandam Stance - Left Leg    Static Standing - Comment/# of Minutes  30 sec; 30 sec                    [facility] Adult PT Treatment/Exercise - 10/26/19 0001      Knee/Hip Exercises: Stretches   Quad Stretch  3 reps;30 seconds;Left    Quad Stretch Limitations  prone with rope    Knee: Self-Stretch to increase Flexion  10 seconds    Knee: Self-Stretch Limitations  knee drive on 63WG step height 10 reps    Gastroc Stretch  3 reps;30 seconds    Gastroc Stretch Limitations  slant board      Knee/Hip Exercises: Aerobic   Stationary Bike  full revolution x 4' seat 13 Lv 3      Knee/Hip Exercises: Machines for Strengthening   Cybex Leg Press  40# 20x             PT Education - 10/26/19 1802    Education  Details  On reassessment findings and POC    Person(s) Educated  Patient    Methods  Explanation    Comprehension  Verbalized understanding       PT Short Term Goals - 10/26/19 1426      PT SHORT TERM GOAL #1   Title  Patient will be independent with initial HEP to improve functional outcomes    Baseline  Reports compliance    Time  3    Period  Weeks    Status  Achieved    Target Date  10/30/19        PT Long Term Goals - 10/26/19 1426      PT LONG TERM GOAL #1   Title  Patient will improve FOTO score to <25% to indicate improvement in functional outcomes    Baseline  current 35% limited    Time  6  Period  Weeks    Status  On-going      PT LONG TERM GOAL #2   Title  Patient will have LT knee AROM 0-120 degrees to improve functional mobility and facilitate squatting to pick up items from floor.    Baseline  Current: 4-120 degrees    Time  6    Period  Weeks    Status  Partially Met      PT LONG TERM GOAL #3   Title  Patient will report at least 75% overall improvement in subjective complaint to indicate improvement in ability to perform ADLs.    Baseline  current 75%    Time  6    Period  Weeks    Status  On-going      PT LONG TERM GOAL #4   Title  Patient will be able to maintain tandem stance >30 seconds on BLEs to improve stability and reduce risk for falls    Baseline  current 30 sec and 30 sec    Time  6    Period  Weeks    Status  Achieved      PT LONG TERM GOAL #5   Title  Patient will have equal to or > 4+/5 MMT throughout BLE to improve ability to perform functional mobility, stair ambulation and ADLs.    Baseline  See MMT    Time  6    Period  Weeks    Status  Achieved            Plan - 10/26/19 1806    Clinical Impression Statement  Patient is making very good progress toward therapy goals. Patient currently with 1/1 short term and 3/5 long term goals currently met/ partially met. Patient shows improvement in strength, balance and overall  tolerance to activity. Patient remains limited in AROM measures as well as mild gait abnormalities which continue to negatively impact functional ability. Patient will continue to benefit from skilled therapy services to address remaining deficits to reduce pain and improve LOF with ADLs and functional mobility.    Examination-Activity Limitations  Bathing;Lift;Stand;Locomotion Level;Bend;Transfers;Carry;Sleep;Dressing;Squat;Stairs;Hygiene/Grooming;Sit    Examination-Participation Restrictions  Yard Work;Cleaning;Laundry;Volunteer;Driving;Community Activity    Rehab Potential  Good    PT Frequency  2x / week    PT Duration  6 weeks    PT Treatment/Interventions  ADLs/Self Care Home Management;Aquatic Therapy;Biofeedback;Cryotherapy;Electrical Stimulation;Therapeutic exercise;Orthotic Fit/Training;Compression bandaging;Manual lymph drainage;Patient/family education;Therapeutic activities;Parrafin;Fluidtherapy;Contrast Bath;Manual techniques;Functional mobility training;Stair training;Moist Heat;Traction;Ultrasound;Iontophoresis 36m/ml Dexamethasone;Gait training;DME Instruction;Balance training;Wheelchair mobility training;Neuromuscular re-education;Scar mobilization;Passive range of motion;Spinal Manipulations;Dry needling;Energy conservation;Splinting;Taping;Vasopneumatic Device;Joint Manipulations    PT Next Visit Plan  Progress functional strenghtening and balance. Add foam to static balance    PT Home Exercise Plan  10/05/19: quad set, SLR, heel slide, 4/30: bridges; 10/13/19: heel prop wiht weight on thigh; 10/15/19: standing TKE    Consulted and Agree with Plan of Care  Patient       Patient will benefit from skilled therapeutic intervention in order to improve the following deficits and impairments:  Abnormal gait, Pain, Increased fascial restricitons, Decreased mobility, Decreased scar mobility, Decreased activity tolerance, Decreased endurance, Decreased range of motion, Decreased strength,  Hypomobility, Decreased balance, Difficulty walking, Increased edema, Impaired flexibility  Visit Diagnosis: Other symptoms and signs involving the musculoskeletal system  Other abnormalities of gait and mobility  Left knee pain, unspecified chronicity     Problem List Patient Active Problem List   Diagnosis Date Noted  . PTSD (post-traumatic stress disorder) 09/01/2019  . GERD (gastroesophageal reflux disease)  09/01/2019  . Primary osteoarthritis of left knee 09/01/2019  . Grade II hemorrhoids 07/03/2019  . Hemorrhoids 05/06/2019  . History of colonic polyps 05/06/2019  . Cervical spondylosis with myelopathy and radiculopathy 12/24/2017  . Coronary artery disease involving native coronary artery of native heart without angina pectoris 12/23/2017  . Hypercholesterolemia 12/23/2017  . Mild obesity 09/20/2016  . Edema of both legs 01/17/2013  . Long term current use of anticoagulant therapy 12/30/2012  . History of deep vein thrombosis (DVT) of lower extremity 12/26/2012  . Dyslipidemia 12/26/2012  . Coronary atherosclerosis without stenosis 12/26/2012   6:09 PM, 10/26/19 Josue Hector PT DPT  Physical Therapist with [facility]  [phone]   [facility] [address] [place], [place], [address] Phone: [phone]   Fax:  [phone]  Name: Casey Castillo MRN: [id number] Date of Birth: Sep 03, 1945

## 2019-10-28 ENCOUNTER — Ambulatory Visit (HOSPITAL_COMMUNITY): Payer: PPO | Admitting: Physical Therapy

## 2019-10-29 ENCOUNTER — Ambulatory Visit (HOSPITAL_COMMUNITY): Payer: PPO

## 2019-10-29 ENCOUNTER — Other Ambulatory Visit: Payer: Self-pay

## 2019-10-29 ENCOUNTER — Encounter (HOSPITAL_COMMUNITY): Payer: Self-pay

## 2019-10-29 DIAGNOSIS — R2689 Other abnormalities of gait and mobility: Secondary | ICD-10-CM

## 2019-10-29 DIAGNOSIS — R29898 Other symptoms and signs involving the musculoskeletal system: Secondary | ICD-10-CM

## 2019-10-29 DIAGNOSIS — M25562 Pain in left knee: Secondary | ICD-10-CM

## 2019-10-29 NOTE — Therapy (Signed)
[place], [place], [address] Phone: [phone]   Fax:  [phone]  Physical Therapy Treatment  Patient Details  Name: Casey Castillo MRN: [id number] Date of Birth: 04/14/1946 Referring Provider (PT): Edmonia Lynch MD    Encounter Date: 10/29/2019  PT End of Session - 10/29/19 1150    Visit Number  10    Number of Visits  12    Date for PT Re-Evaluation  11/20/19    Authorization Type  [employer]; no auth req, no V.L.    Progress Note Due on Visit  12    PT Start Time  1135    PT Stop Time  1215    PT Time Calculation (min)  40 min    Activity Tolerance  Patient tolerated treatment well    Behavior During Therapy  WFL for tasks assessed/performed       Past Medical History:  Diagnosis Date  . Arthritis   . Cervical myelopathy with cervical radiculopathy   . Claustrophobia   . Coronary artery disease    5%blockage on left anterior descending Dr. Sallyanne Kuster   . Diverticulitis    hx of  . Enlarged prostate    takes flomax  . GERD (gastroesophageal reflux disease)   . History of blood clots    november, 5 1995- left leg; 2014 - left calf  . History of kidney stones   . Hyperlipidemia    primary Dr. Selena Batten  . Pneumonia    as a child  74 YEARS OLD  . PTSD (post-traumatic stress disorder)   . PTSD (post-traumatic stress disorder)   . Seasonal allergies   . Urinary frequency    hx of     Past Surgical History:  Procedure Laterality Date  . ANTERIOR CERVICAL DECOMP/DISCECTOMY FUSION  01/28/2012   Procedure: ANTERIOR CERVICAL DECOMPRESSION/DISCECTOMY FUSION 2 LEVELS;  Surgeon: Kristeen Miss, MD;  Location: [facility];  Service: Neurosurgery;  Laterality: N/A;  Cervical six-seven, cervical seven-thoracic one Anterior cervical decompression/diskectomy/fusion  . ANTERIOR CERVICAL DECOMP/DISCECTOMY FUSION N/A 12/24/2017   Procedure: ANTERIOR CERVICAL DECOMPRESSION/DISCECTOMY FUSION CERVICAL  FOUR-FIVE/CERVICAL FIVE-SIX TWO LEVELS;  Surgeon: Kristeen Miss, MD;  Location: [facility];  Service: Neurosurgery;  Laterality: N/A;  ANTERIOR CERVICAL DECOMPRESSION/DISCECTOMY FUSION CERVICAL FOUR-FIVE/CERVICAL FIVE-SIX TWO LEVELS  . CARDIAC CATHETERIZATION     2006  . CARDIOVASCULAR STRESS TEST     2006 and 2008 at [facility] heart [facility] vascular, 2011 myoview and echo  . COLONOSCOPY    . COLONOSCOPY N/A 08/30/2015   Dr. Arnoldo Morale: one 20 mm polyp in proximal ascending colon, removed piecemeal using hot snare. Resected and retrieved. Diverticulosis in sigmoid colon. Path with tubular adenoma, no high grade dysplasia. 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 COLONOSCOPY N/A 08/26/2019   Procedure: COLONOSCOPY;  Surgeon: Daneil Dolin, MD;  [facility];  Service: Endoscopy;  Laterality: N/A;  9:00  . engrown  toenail Bilateral   . EYE SURGERY     Laser for torn retina  . HEMORRHOID SURGERY     sept 8, 2008  . KIDNEY STONE SURGERY     lithotripsy 1980's  . KNEE SURGERY     left 04-11-94 ( has had two surgeries to left knee)  . NASAL SINUS SURGERY     Jun 30 1998  . NM MYOVIEW LTD  07/20/2009   no ischemia  . PILONIDAL CYST / SINUS EXCISION     surgery  01-08-74  . POLYPECTOMY  08/26/2019   Procedure: POLYPECTOMY;  Surgeon: Daneil Dolin, MD;  [facility];  Service: Endoscopy;;  . RADIOLOGY WITH ANESTHESIA N/A 06/12/2018   Procedure: MRI OF CHEST WITH AND WITHOUT CONTRAST;  Surgeon: Radiologist, Medication, MD;  Location: [place];  Service: Radiology;  Laterality: N/A;  . SHOULDER SURGERY     right 12-20-2003  . TOTAL KNEE ARTHROPLASTY Left 09/15/2019   Procedure: TOTAL KNEE ARTHROPLASTY;  Surgeon: Renette Butters, MD;  [facility]: [facility];  Service: Orthopedics;  Laterality: Left;  . TRIGGER FINGER RELEASE     03-21-98 right hand ring finger  . US ECHOCARDIOGRAPHY  07/20/2009   LA & RA mildly dilated,trace MR,TR.    There were no vitals filed for  this visit.  Subjective Assessment - 10/29/19 1140    Subjective  Pt stated MD happy wiht knee progress.  Encouraged pt to begin rubbing knee to address nerve based pain.    Patient Stated Goals  Get back to where I'm 100 %    Currently in Pain?  Yes    Pain Score  1     Pain Location  Knee    Pain Orientation  Left;Medial    Pain Descriptors / Indicators  Aching    Pain Type  Surgical pain    Pain Onset  1 to 4 weeks ago    Pain Frequency  Intermittent    Aggravating Factors   bending, coming down stairs, sleeping    Pain Relieving Factors  rest, ice    Effect of Pain on Daily Activities  limits                        [facility] Adult PT Treatment/Exercise - 10/29/19 0001      Knee/Hip Exercises: Stretches   Gastroc Stretch  3 reps;30 seconds    Gastroc Stretch Limitations  slant board      Knee/Hip Exercises: Machines for Strengthening   Cybex Leg Press  50# 2x 10      Knee/Hip Exercises: Standing   Heel Raises Limitations  heel walking 2RT     Forward Lunges  Both;10 reps    Forward Lunges Limitations  cueing for mechanicns    Side Lunges  Both;10 reps    Side Lunges Limitations  cueing for form/mechanics    Terminal Knee Extension  5 reps    Theraband Level (Terminal Knee Extension)  Other (comment)    Terminal Knee Extension Limitations  purple, retro gait (heel to toe)    Functional Squat  2 sets;10 reps    SLS  3 x 10" black foam; intermittent HHA    Other Standing Knee Exercises  tandem stance on foam wiht                PT Short Term Goals - 10/26/19 1426      PT SHORT TERM GOAL #1   Title  Patient will be independent with initial HEP to improve functional outcomes    Baseline  Reports compliance    Time  3    Period  Weeks    Status  Achieved    Target Date  10/30/19        PT Long Term Goals - 10/26/19 1426      PT LONG TERM GOAL #1   Title  Patient will improve FOTO score to <25% to indicate improvement in functional outcomes     Baseline  current 35%  limited    Time  6    Period  Weeks    Status  On-going      PT LONG TERM GOAL #2   Title  Patient will have LT knee AROM 0-120 degrees to improve functional mobility and facilitate squatting to pick up items from floor.    Baseline  Current: 4-120 degrees    Time  6    Period  Weeks    Status  Partially Met      PT LONG TERM GOAL #3   Title  Patient will report at least 75% overall improvement in subjective complaint to indicate improvement in ability to perform ADLs.    Baseline  current 75%    Time  6    Period  Weeks    Status  On-going      PT LONG TERM GOAL #4   Title  Patient will be able to maintain tandem stance >30 seconds on BLEs to improve stability and reduce risk for falls    Baseline  current 30 sec and 30 sec    Time  6    Period  Weeks    Status  Achieved      PT LONG TERM GOAL #5   Title  Patient will have equal to or > 4+/5 MMT throughout BLE to improve ability to perform functional mobility, stair ambulation and ADLs.    Baseline  See MMT    Time  6    Period  Weeks    Status  Achieved            Plan - 10/29/19 1217    Clinical Impression Statement  Pt progressing well towards POC.  Progressed quad TKE and functional strengthening as well as balalnce activites.  Added TKE during retro gait for quad strengthening, began foam wiht tandem and vector stance iwht intermittent HHA required for proper stance during activities and lunges.  Pt did require cueing for form/mechanics with some new exercises.  No reports of pain through session, was limited by fatigue at EOS.    Examination-Activity Limitations  Bathing;Lift;Stand;Locomotion Level;Bend;Transfers;Carry;Sleep;Dressing;Squat;Stairs;Hygiene/Grooming;Sit    Examination-Participation Restrictions  Yard Work;Cleaning;Laundry;Volunteer;Driving;Community Activity    Stability/Clinical Decision Making  Stable/Uncomplicated    Clinical Decision Making  Low    Rehab Potential  Good     PT Frequency  2x / week    PT Duration  6 weeks    PT Treatment/Interventions  ADLs/Self Care Home Management;Aquatic Therapy;Biofeedback;Cryotherapy;Electrical Stimulation;Therapeutic exercise;Orthotic Fit/Training;Compression bandaging;Manual lymph drainage;Patient/family education;Therapeutic activities;Parrafin;Fluidtherapy;Contrast Bath;Manual techniques;Functional mobility training;Stair training;Moist Heat;Traction;Ultrasound;Iontophoresis 65m/ml Dexamethasone;Gait training;DME Instruction;Balance training;Wheelchair mobility training;Neuromuscular re-education;Scar mobilization;Passive range of motion;Spinal Manipulations;Dry needling;Energy conservation;Splinting;Taping;Vasopneumatic Device;Joint Manipulations    PT Next Visit Plan  Progress functional strenghtening and balance.       Patient will benefit from skilled therapeutic intervention in order to improve the following deficits and impairments:  Abnormal gait, Pain, Increased fascial restricitons, Decreased mobility, Decreased scar mobility, Decreased activity tolerance, Decreased endurance, Decreased range of motion, Decreased strength, Hypomobility, Decreased balance, Difficulty walking, Increased edema, Impaired flexibility  Visit Diagnosis: Left knee pain, unspecified chronicity  Other symptoms and signs involving the musculoskeletal system  Other abnormalities of gait and mobility     Problem List Patient Active Problem List   Diagnosis Date Noted  . PTSD (post-traumatic stress disorder) 09/01/2019  . GERD (gastroesophageal reflux disease) 09/01/2019  . Primary osteoarthritis of left knee 09/01/2019  . Grade II hemorrhoids 07/03/2019  . Hemorrhoids 05/06/2019  . History of colonic polyps 05/06/2019  .  Cervical spondylosis with myelopathy and radiculopathy 12/24/2017  . Coronary artery disease involving native coronary artery of native heart without angina pectoris 12/23/2017  . Hypercholesterolemia 12/23/2017  .  Mild obesity 09/20/2016  . Edema of both legs 01/17/2013  . Long term current use of anticoagulant therapy 12/30/2012  . History of deep vein thrombosis (DVT) of lower extremity 12/26/2012  . Dyslipidemia 12/26/2012  . Coronary atherosclerosis without stenosis 12/26/2012   Ihor Austin, LPTA/CLT; CBIS [phone]  Aldona Lento 10/29/2019, 12:20 PM  [place] [address] [place], [place], [address] Phone: [phone]   Fax:  [phone]  Name: Casey Castillo MRN: [id number] Date of Birth: 23-Feb-1946

## 2019-11-02 ENCOUNTER — Encounter (HOSPITAL_COMMUNITY): Payer: Self-pay | Admitting: Physical Therapy

## 2019-11-02 ENCOUNTER — Other Ambulatory Visit: Payer: Self-pay

## 2019-11-02 ENCOUNTER — Ambulatory Visit (HOSPITAL_COMMUNITY): Payer: PPO | Admitting: Physical Therapy

## 2019-11-02 DIAGNOSIS — R29898 Other symptoms and signs involving the musculoskeletal system: Secondary | ICD-10-CM | POA: Diagnosis not present

## 2019-11-02 DIAGNOSIS — R2689 Other abnormalities of gait and mobility: Secondary | ICD-10-CM

## 2019-11-02 DIAGNOSIS — M25562 Pain in left knee: Secondary | ICD-10-CM

## 2019-11-02 NOTE — Therapy (Signed)
[place], [place], [address] Phone: [phone]   Fax:  [phone]  Physical Therapy Treatment  Patient Details  Name: Casey Castillo MRN: [id number] Date of Birth: 1945-11-11 Referring Provider (PT): Edmonia Lynch MD    Encounter Date: 11/02/2019  PT End of Session - 11/02/19 1125    Visit Number  11    Number of Visits  12    Date for PT Re-Evaluation  11/20/19    Authorization Type  [employer]; no auth req, no V.L.    Progress Note Due on Visit  12    PT Start Time  1120    PT Stop Time  1200    PT Time Calculation (min)  40 min    Activity Tolerance  Patient tolerated treatment well    Behavior During Therapy  WFL for tasks assessed/performed       Past Medical History:  Diagnosis Date  . Arthritis   . Cervical myelopathy with cervical radiculopathy   . Claustrophobia   . Coronary artery disease    5%blockage on left anterior descending Dr. Sallyanne Kuster   . Diverticulitis    hx of  . Enlarged prostate    takes flomax  . GERD (gastroesophageal reflux disease)   . History of blood clots    november, 5 1995- left leg; 2014 - left calf  . History of kidney stones   . Hyperlipidemia    primary Dr. Selena Batten  . Pneumonia    as a child  74 YEARS OLD  . PTSD (post-traumatic stress disorder)   . PTSD (post-traumatic stress disorder)   . Seasonal allergies   . Urinary frequency    hx of     Past Surgical History:  Procedure Laterality Date  . ANTERIOR CERVICAL DECOMP/DISCECTOMY FUSION  01/28/2012   Procedure: ANTERIOR CERVICAL DECOMPRESSION/DISCECTOMY FUSION 2 LEVELS;  Surgeon: Kristeen Miss, MD;  Location: [facility];  Service: Neurosurgery;  Laterality: N/A;  Cervical six-seven, cervical seven-thoracic one Anterior cervical decompression/diskectomy/fusion  . ANTERIOR CERVICAL DECOMP/DISCECTOMY FUSION N/A 12/24/2017   Procedure: ANTERIOR CERVICAL DECOMPRESSION/DISCECTOMY FUSION CERVICAL  FOUR-FIVE/CERVICAL FIVE-SIX TWO LEVELS;  Surgeon: Kristeen Miss, MD;  Location: [place];  Service: Neurosurgery;  Laterality: N/A;  ANTERIOR CERVICAL DECOMPRESSION/DISCECTOMY FUSION CERVICAL FOUR-FIVE/CERVICAL FIVE-SIX TWO LEVELS  . CARDIAC CATHETERIZATION     2006  . CARDIOVASCULAR STRESS TEST     2006 and 2008 at [facility] heart [facility] vascular, 2011 myoview and echo  . COLONOSCOPY    . COLONOSCOPY N/A 08/30/2015   Dr. Arnoldo Morale: one 20 mm polyp in proximal ascending colon, removed piecemeal using hot snare. Resected and retrieved. Diverticulosis in sigmoid colon. Path with tubular adenoma, no high grade dysplasia. 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 COLONOSCOPY N/A 08/26/2019   Procedure: COLONOSCOPY;  Surgeon: Daneil Dolin, MD;  [facility];  Service: Endoscopy;  Laterality: N/A;  9:00  . engrown  toenail Bilateral   . EYE SURGERY     Laser for torn retina  . HEMORRHOID SURGERY     sept 8, 2008  . KIDNEY STONE SURGERY     lithotripsy 1980's  . KNEE SURGERY     left 04-11-94 ( has had two surgeries to left knee)  . NASAL SINUS SURGERY     Jun 30 1998  . NM MYOVIEW LTD  07/20/2009   no ischemia  . PILONIDAL CYST / SINUS EXCISION     surgery  01-08-74  . POLYPECTOMY  08/26/2019   Procedure: POLYPECTOMY;  Surgeon: Daneil Dolin, MD;  [facility];  Service: Endoscopy;;  . RADIOLOGY WITH ANESTHESIA N/A 06/12/2018   Procedure: MRI OF CHEST WITH AND WITHOUT CONTRAST;  Surgeon: Radiologist, Medication, MD;  Location: [place];  Service: Radiology;  Laterality: N/A;  . SHOULDER SURGERY     right 12-20-2003  . TOTAL KNEE ARTHROPLASTY Left 09/15/2019   Procedure: TOTAL KNEE ARTHROPLASTY;  Surgeon: Renette Butters, MD;  Location: [facility];  Service: Orthopedics;  Laterality: Left;  . TRIGGER FINGER RELEASE     03-21-98 right hand ring finger  . US ECHOCARDIOGRAPHY  07/20/2009   LA & RA mildly dilated,trace MR,TR.    There were no vitals filed for  this visit.  Subjective Assessment - 11/02/19 1124    Subjective  Says he has working double hard on knee ROM. Says he still has a little pain in one spot.    Patient Stated Goals  Get back to where I'm 100 %    Currently in Pain?  Yes    Pain Score  1     Pain Location  Knee    Pain Orientation  Left;Medial    Pain Descriptors / Indicators  Aching    Pain Type  Surgical pain    Pain Onset  1 to 4 weeks ago    Pain Frequency  Intermittent                        [facility] Adult PT Treatment/Exercise - 11/02/19 0001      Knee/Hip Exercises: Stretches   Gastroc Stretch  3 reps;30 seconds    Gastroc Stretch Limitations  on 1st step      Knee/Hip Exercises: Aerobic   Stationary Bike  full revolution x 4' seat 13 Lv 3      Knee/Hip Exercises: Machines for Strengthening   Cybex Leg Press  50# 2x 10      Knee/Hip Exercises: Standing   Heel Raises  20 reps    Heel Raises Limitations  from step     Forward Lunges  Both;10 reps    Forward Lunges Limitations  cueing for mechanicns    Terminal Knee Extension  Left;10 reps    Theraband Level (Terminal Knee Extension)  Level 3 (Green)    Terminal Knee Extension Limitations  5 sec hold    Functional Squat  10 reps;1 set    Stairs  7 in 4RT reciprocal gait no hand rail     SLS  2x 15" blue foam   able to do 25 sec max    Other Standing Knee Exercises  band sidestepping GTB x 3RT     Other Standing Knee Exercises  tandem stance on foam 2 x 30" each       Manual Therapy   Manual Therapy  Myofascial release;Passive ROM    Manual therapy comments  completed seperately from all other skilled interventions    Myofascial Release  scar tissue message     Passive ROM  Lt knee extension PROM 4 x30"               PT Short Term Goals - 10/26/19 1426      PT SHORT TERM GOAL #1   Title  Patient will be independent with initial HEP to improve functional outcomes    Baseline  Reports compliance    Time  3    Period  Weeks     Status  Achieved    Target Date  10/30/19        PT Long Term Goals - 10/26/19 1426      PT LONG TERM GOAL #1   Title  Patient will improve FOTO score to <25% to indicate improvement in functional outcomes    Baseline  current 35% limited    Time  6    Period  Weeks    Status  On-going      PT LONG TERM GOAL #2   Title  Patient will have LT knee AROM 0-120 degrees to improve functional mobility and facilitate squatting to pick up items from floor.    Baseline  Current: 4-120 degrees    Time  6    Period  Weeks    Status  Partially Met      PT LONG TERM GOAL #3   Title  Patient will report at least 75% overall improvement in subjective complaint to indicate improvement in ability to perform ADLs.    Baseline  current 75%    Time  6    Period  Weeks    Status  On-going      PT LONG TERM GOAL #4   Title  Patient will be able to maintain tandem stance >30 seconds on BLEs to improve stability and reduce risk for falls    Baseline  current 30 sec and 30 sec    Time  6    Period  Weeks    Status  Achieved      PT LONG TERM GOAL #5   Title  Patient will have equal to or > 4+/5 MMT throughout BLE to improve ability to perform functional mobility, stair ambulation and ADLs.    Baseline  See MMT    Time  6    Period  Weeks    Status  Achieved            Plan - 11/02/19 1252    Clinical Impression Statement  Patient continues to progress well toward therapy goals. Patient shows improved static balance on compliant surface and was able to progress LE strengthening exercise today. Patient demos good form with functional squatting with decreased RT weight shifting. Patient educated on proper form and function of added band sidestepping. Patient continues to have mild knee extension AROM limitation, but notes working diligently on this with HEP as instructed. Will reassess patient progress next visit and modify POC as indicated.    Examination-Activity Limitations   Bathing;Lift;Stand;Locomotion Level;Bend;Transfers;Carry;Sleep;Dressing;Squat;Stairs;Hygiene/Grooming;Sit    Examination-Participation Restrictions  Yard Work;Cleaning;Laundry;Volunteer;Driving;Community Activity    Stability/Clinical Decision Making  Stable/Uncomplicated    Rehab Potential  Good    PT Frequency  2x / week    PT Duration  6 weeks    PT Treatment/Interventions  ADLs/Self Care Home Management;Aquatic Therapy;Biofeedback;Cryotherapy;Electrical Stimulation;Therapeutic exercise;Orthotic Fit/Training;Compression bandaging;Manual lymph drainage;Patient/family education;Therapeutic activities;Parrafin;Fluidtherapy;Contrast Bath;Manual techniques;Functional mobility training;Stair training;Moist Heat;Traction;Ultrasound;Iontophoresis 46m/ml Dexamethasone;Gait training;DME Instruction;Balance training;Wheelchair mobility training;Neuromuscular re-education;Scar mobilization;Passive range of motion;Spinal Manipulations;Dry needling;Energy conservation;Splinting;Taping;Vasopneumatic Device;Joint Manipulations    PT Next Visit Plan  Reassess next visit    PT Home Exercise Plan  10/05/19: quad set, SLR, heel slide, 4/30: bridges; 10/13/19: heel prop wiht weight on thigh; 10/15/19: standing TKE    Consulted and Agree with Plan of Care  Patient       Patient will benefit from skilled therapeutic intervention in order to improve the following deficits and impairments:  Abnormal gait, Pain, Increased fascial restricitons, Decreased mobility, Decreased scar  mobility, Decreased activity tolerance, Decreased endurance, Decreased range of motion, Decreased strength, Hypomobility, Decreased balance, Difficulty walking, Increased edema, Impaired flexibility  Visit Diagnosis: Left knee pain, unspecified chronicity  Other symptoms and signs involving the musculoskeletal system  Other abnormalities of gait and mobility     Problem List Patient Active Problem List   Diagnosis Date Noted  . PTSD  (post-traumatic stress disorder) 09/01/2019  . GERD (gastroesophageal reflux disease) 09/01/2019  . Primary osteoarthritis of left knee 09/01/2019  . Grade II hemorrhoids 07/03/2019  . Hemorrhoids 05/06/2019  . History of colonic polyps 05/06/2019  . Cervical spondylosis with myelopathy and radiculopathy 12/24/2017  . Coronary artery disease involving native coronary artery of native heart without angina pectoris 12/23/2017  . Hypercholesterolemia 12/23/2017  . Mild obesity 09/20/2016  . Edema of both legs 01/17/2013  . Long term current use of anticoagulant therapy 12/30/2012  . History of deep vein thrombosis (DVT) of lower extremity 12/26/2012  . Dyslipidemia 12/26/2012  . Coronary atherosclerosis without stenosis 12/26/2012   12:59 PM, 11/02/19 Josue Hector PT DPT  Physical Therapist with [facility]  [phone] [place] [address] [place], [place], [address] Phone: [phone]   Fax:  [phone]  Name: Casey Castillo MRN: [id number] Date of Birth: [birth year]

## 2019-11-04 ENCOUNTER — Other Ambulatory Visit: Payer: Self-pay

## 2019-11-04 ENCOUNTER — Encounter (HOSPITAL_COMMUNITY): Payer: Self-pay | Admitting: Physical Therapy

## 2019-11-04 ENCOUNTER — Other Ambulatory Visit: Payer: Self-pay | Admitting: Cardiovascular Disease

## 2019-11-04 ENCOUNTER — Ambulatory Visit (HOSPITAL_COMMUNITY): Payer: PPO | Admitting: Physical Therapy

## 2019-11-04 DIAGNOSIS — R29898 Other symptoms and signs involving the musculoskeletal system: Secondary | ICD-10-CM

## 2019-11-04 DIAGNOSIS — M25562 Pain in left knee: Secondary | ICD-10-CM

## 2019-11-04 DIAGNOSIS — R2689 Other abnormalities of gait and mobility: Secondary | ICD-10-CM

## 2019-11-04 NOTE — Therapy (Signed)
[place], [place], [address] Phone: [phone]   Fax:  [phone]  Physical Therapy Treatment/ Discharge Summary   Patient Details  Name: Casey Castillo MRN: [id number] Date of Birth: [birth year] Referring Provider (PT): Edmonia Lynch MD    Encounter Date: 11/04/2019   PHYSICAL THERAPY DISCHARGE SUMMARY  Visits from Start of Care: 12  Current functional level related to goals / functional outcomes: See below    Remaining deficits: See below    Education / Equipment: See assessment  Plan: Patient agrees to discharge.  Patient goals were met. Patient is being discharged due to being pleased with the current functional level.  ?????       PT End of Session - 11/04/19 1420    Visit Number  12    Number of Visits  12    Date for PT Re-Evaluation  11/20/19    Authorization Type  [employer]; no auth req, no V.L.    Progress Note Due on Visit  12    PT Start Time  1347    PT Stop Time  1430    PT Time Calculation (min)  43 min    Activity Tolerance  Patient tolerated treatment well    Behavior During Therapy  WFL for tasks assessed/performed       Past Medical History:  Diagnosis Date  . Arthritis   . Cervical myelopathy with cervical radiculopathy   . Claustrophobia   . Coronary artery disease    5%blockage on left anterior descending Dr. Sallyanne Kuster   . Diverticulitis    hx of  . Enlarged prostate    takes flomax  . GERD (gastroesophageal reflux disease)   . History of blood clots    november, 5 1995- left leg; 2014 - left calf  . History of kidney stones   . Hyperlipidemia    primary Dr. Selena Batten  . Pneumonia    as a child  74 YEARS OLD  . PTSD (post-traumatic stress disorder)   . PTSD (post-traumatic stress disorder)   . Seasonal allergies   . Urinary frequency    hx of     Past Surgical History:  Procedure Laterality Date  . ANTERIOR CERVICAL DECOMP/DISCECTOMY FUSION   01/28/2012   Procedure: ANTERIOR CERVICAL DECOMPRESSION/DISCECTOMY FUSION 2 LEVELS;  Surgeon: Kristeen Miss, MD;  Location: [place] [facility];  Service: Neurosurgery;  Laterality: N/A;  Cervical six-seven, cervical seven-thoracic one Anterior cervical decompression/diskectomy/fusion  . ANTERIOR CERVICAL DECOMP/DISCECTOMY FUSION N/A 12/24/2017   Procedure: ANTERIOR CERVICAL DECOMPRESSION/DISCECTOMY FUSION CERVICAL FOUR-FIVE/CERVICAL FIVE-SIX TWO LEVELS;  Surgeon: Kristeen Miss, MD;  Location: [place];  Service: Neurosurgery;  Laterality: N/A;  ANTERIOR CERVICAL DECOMPRESSION/DISCECTOMY FUSION CERVICAL FOUR-FIVE/CERVICAL FIVE-SIX TWO LEVELS  . CARDIAC CATHETERIZATION     2006  . CARDIOVASCULAR STRESS TEST     2006 and 2008 at [facility] heart and vascular, 2011 myoview and echo  . COLONOSCOPY    . COLONOSCOPY N/A 08/30/2015   Dr. Arnoldo Morale: one 20 mm polyp in proximal ascending colon, removed piecemeal using hot snare. Resected and retrieved. Diverticulosis in sigmoid colon. Path with tubular adenoma, no high grade dysplasia. 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 COLONOSCOPY N/A 08/26/2019   Procedure: COLONOSCOPY;  Surgeon: Daneil Dolin, MD;  [facility];  Service: Endoscopy;  Laterality: N/A;  9:00  . engrown  toenail Bilateral   . EYE SURGERY     Laser for torn retina  .  HEMORRHOID SURGERY     sept 8, 2008  . KIDNEY STONE SURGERY     lithotripsy 1980's  . KNEE SURGERY     left 04-11-94 ( has had two surgeries to left knee)  . NASAL SINUS SURGERY     Jun 30 1998  . NM MYOVIEW LTD  07/20/2009   no ischemia  . PILONIDAL CYST / SINUS EXCISION     surgery 01-08-74  . POLYPECTOMY  08/26/2019   Procedure: POLYPECTOMY;  Surgeon: Daneil Dolin, MD;  [facility];  Service: Endoscopy;;  . RADIOLOGY WITH ANESTHESIA N/A 06/12/2018   Procedure: MRI OF CHEST WITH AND WITHOUT CONTRAST;  Surgeon: Radiologist, Medication, MD;  Location: [place];  Service:  Radiology;  Laterality: N/A;  . SHOULDER SURGERY     right 12-20-2003  . TOTAL KNEE ARTHROPLASTY Left 09/15/2019   Procedure: TOTAL KNEE ARTHROPLASTY;  Surgeon: Renette Butters, MD;  [facility];  Service: Orthopedics;  Laterality: Left;  . TRIGGER FINGER RELEASE     03-21-98 right hand ring finger  . US ECHOCARDIOGRAPHY  07/20/2009   LA & RA mildly dilated,trace MR,TR.    There were no vitals filed for this visit.  Subjective Assessment - 11/04/19 1352    Subjective  Patient says his knee still gets stiff and sore occasionally. Says knee is a little more sore today, but notes increased activity around his home and in his yard. Says he feels he is otherwise able to do almost anything he wants to. Patient says he feels about 95% improved since starting therapy.    Limitations  Sitting;Lifting;Standing;Walking;House hold activities    How long can you stand comfortably?  no limit    How long can you walk comfortably?  no limit    Patient Stated Goals  Get back to where I'm 100 %    Currently in Pain?  Yes    Pain Score  1     Pain Location  Knee    Pain Orientation  Medial;Left    Pain Descriptors / Indicators  Aching;Dull    Pain Type  Surgical pain    Pain Onset  1 to 4 weeks ago    Pain Frequency  Intermittent         [facility] PT Assessment - 11/04/19 0001      Assessment   Medical Diagnosis  LT TKA     Referring Provider (PT)  Edmonia Lynch MD     Onset Date/Surgical Date  09/15/19    Next MD Visit  10/30/19    Prior Therapy  Yes in acute and HH therapy       Precautions   Precautions  None      Restrictions   Weight Bearing Restrictions  No      Balance Screen   Has the patient fallen in the past 6 months  No      [place] residence    Living Arrangements  Alone      Prior Function   Level of Independence  Independent      Cognition   Overall Cognitive Status  Within Functional Limits for tasks assessed       Observation/Other Assessments   Observations  incision appears intact and to be well healed     Focus on Therapeutic Outcomes (FOTO)   9% limited    was 35%      Sensation   Light Touch  Appears Intact  AROM   Left Knee Extension  4    Left Knee Flexion  115      Strength   Right Hip Flexion  5/5    Right Hip Extension  5/5    Right Hip ABduction  5/5    Left Hip Flexion  5/5    Left Hip Extension  4+/5    Left Hip ABduction  4+/5    Right Knee Flexion  5/5    Right Knee Extension  5/5    Left Knee Flexion  4+/5    Left Knee Extension  4+/5    Right Ankle Dorsiflexion  5/5    Left Ankle Dorsiflexion  5/5      Ambulation/Gait   Ambulation/Gait  Yes    Ambulation/Gait Assistance  7: Independent    Assistive device  None    Gait Pattern  Within Functional Limits    Ambulation Surface  Level;Indoor    Stairs  Yes    Stairs Assistance  7: Independent    Stair Management Technique  No rails;Alternating pattern    Number of Stairs  8    Height of Stairs  7      Static Standing Balance   Static Standing Balance -  Activities   Single Leg Stance - Right Leg;Single Leg Stance - Left Leg;Tandam Stance - Right Leg;Tandam Stance - Left Leg    Static Standing - Comment/# of Minutes  30 sec; 25 sec; 30 sec; 30 sec                     [facility] Adult PT Treatment/Exercise - 11/04/19 0001      Knee/Hip Exercises: Stretches   Gastroc Stretch  3 reps;30 seconds    Gastroc Stretch Limitations  slant board       Knee/Hip Exercises: Aerobic   Stationary Bike  full revolution x 4' seat 13 Lv 3      Knee/Hip Exercises: Standing   Functional Squat  10 reps;1 set    SLS  3 x 15" solid floor       Knee/Hip Exercises: Supine   Heel Slides  10 reps    Heel Slides Limitations  AAROM with strap     Heel Prop for Knee Extension  2 minutes    Heel Prop for Knee Extension Weight (lbs)  2#               PT Short Term Goals - 10/26/19 1426      PT SHORT TERM GOAL #1    Title  Patient will be independent with initial HEP to improve functional outcomes    Baseline  Reports compliance    Time  3    Period  Weeks    Status  Achieved    Target Date  10/30/19        PT Long Term Goals - 11/04/19 1426      PT LONG TERM GOAL #1   Title  Patient will improve FOTO score to <25% to indicate improvement in functional outcomes    Baseline  current 9% limited    Time  6    Period  Weeks    Status  Achieved      PT LONG TERM GOAL #2   Title  Patient will have LT knee AROM 0-120 degrees to improve functional mobility and facilitate squatting to pick up items from floor.    Baseline  Current: 4-120 degrees    Time  6  Period  Weeks    Status  Partially Met      PT LONG TERM GOAL #3   Title  Patient will report at least 75% overall improvement in subjective complaint to indicate improvement in ability to perform ADLs.    Baseline  current 95%    Time  6    Period  Weeks    Status  Achieved      PT LONG TERM GOAL #4   Title  Patient will be able to maintain tandem stance >30 seconds on BLEs to improve stability and reduce risk for falls    Baseline  current 30 sec and 30 sec    Time  6    Period  Weeks    Status  Achieved      PT LONG TERM GOAL #5   Title  Patient will have equal to or > 4+/5 MMT throughout BLE to improve ability to perform functional mobility, stair ambulation and ADLs.    Baseline  See MMT    Time  6    Period  Weeks    Status  Achieved            Plan - 11/04/19 1420    Clinical Impression Statement  Patient has made very good progress to therapy goals. Patient currently with all goals met, except mild AROM limitation. Patient has previously met flexion goal at 120, describes slight increase in soreness/ swelling today due to increased activity levels which results in slightly less flexion measured this date. Overall patient is functioning very well, is able to walk normally and ambulate stairs with reciprocal gait  unsupported and with good control. Patient also shows good tolerance and performance to static balance on solid and compliant surfaces. At this time patient will be DC from therapy with all goals met/ partially met to transition to HEP. Patient educated on HEP and instructed to follow up with therapy services with any further questions or concerns.    Examination-Activity Limitations  Bathing;Lift;Stand;Locomotion Level;Bend;Transfers;Carry;Sleep;Dressing;Squat;Stairs;Hygiene/Grooming;Sit    Examination-Participation Restrictions  Yard Work;Cleaning;Laundry;Volunteer;Driving;Community Activity    Stability/Clinical Decision Making  Stable/Uncomplicated    Rehab Potential  Good    PT Treatment/Interventions  ADLs/Self Care Home Management;Aquatic Therapy;Biofeedback;Cryotherapy;Electrical Stimulation;Therapeutic exercise;Orthotic Fit/Training;Compression bandaging;Manual lymph drainage;Patient/family education;Therapeutic activities;Parrafin;Fluidtherapy;Contrast Bath;Manual techniques;Functional mobility training;Stair training;Moist Heat;Traction;Ultrasound;Iontophoresis 45m/ml Dexamethasone;Gait training;DME Instruction;Balance training;Wheelchair mobility training;Neuromuscular re-education;Scar mobilization;Passive range of motion;Spinal Manipulations;Dry needling;Energy conservation;Splinting;Taping;Vasopneumatic Device;Joint Manipulations    PT Next Visit Plan  DC    PT Home Exercise Plan  10/05/19: quad set, SLR, heel slide, 4/30: bridges; 10/13/19: heel prop wiht weight on thigh; 10/15/19: standing TKE    Consulted and Agree with Plan of Care  Patient       Patient will benefit from skilled therapeutic intervention in order to improve the following deficits and impairments:  Abnormal gait, Pain, Increased fascial restricitons, Decreased mobility, Decreased scar mobility, Decreased activity tolerance, Decreased endurance, Decreased range of motion, Decreased strength, Hypomobility, Decreased balance,  Difficulty walking, Increased edema, Impaired flexibility  Visit Diagnosis: Left knee pain, unspecified chronicity  Other symptoms and signs involving the musculoskeletal system  Other abnormalities of gait and mobility     Problem List Patient Active Problem List   Diagnosis Date Noted  . PTSD (post-traumatic stress disorder) 09/01/2019  . GERD (gastroesophageal reflux disease) 09/01/2019  . Primary osteoarthritis of left knee 09/01/2019  . Grade II hemorrhoids 07/03/2019  . Hemorrhoids 05/06/2019  . History of colonic polyps 05/06/2019  . Cervical spondylosis with myelopathy and radiculopathy 12/24/2017  .  Coronary artery disease involving native coronary artery of native heart without angina pectoris 12/23/2017  . Hypercholesterolemia 12/23/2017  . Mild obesity 09/20/2016  . Edema of both legs 01/17/2013  . Long term current use of anticoagulant therapy 12/30/2012  . History of deep vein thrombosis (DVT) of lower extremity 12/26/2012  . Dyslipidemia 12/26/2012  . Coronary atherosclerosis without stenosis 12/26/2012   2:29 PM, 11/04/19 Josue Hector PT DPT  Physical Therapist with [facility]  [phone]   [facility] [address] [place], [place], [address] Phone: [phone]   Fax:  [phone]  Name: Casey Castillo MRN: [id number] Date of Birth: [birth year]

## 2019-11-06 ENCOUNTER — Ambulatory Visit (HOSPITAL_COMMUNITY): Payer: PPO

## 2019-11-10 ENCOUNTER — Encounter (HOSPITAL_COMMUNITY): Payer: PPO | Admitting: Physical Therapy

## 2019-11-12 ENCOUNTER — Encounter (HOSPITAL_COMMUNITY): Payer: PPO | Admitting: Physical Therapy

## 2019-11-19 ENCOUNTER — Encounter (HOSPITAL_COMMUNITY): Payer: PPO | Admitting: Physical Therapy

## 2019-11-20 ENCOUNTER — Encounter (HOSPITAL_COMMUNITY): Payer: PPO

## 2019-12-03 DIAGNOSIS — F4312 Post-traumatic stress disorder, chronic: Secondary | ICD-10-CM | POA: Diagnosis not present

## 2019-12-09 DIAGNOSIS — I10 Essential (primary) hypertension: Secondary | ICD-10-CM | POA: Diagnosis not present

## 2019-12-09 DIAGNOSIS — N401 Enlarged prostate with lower urinary tract symptoms: Secondary | ICD-10-CM | POA: Diagnosis not present

## 2019-12-09 DIAGNOSIS — E7849 Other hyperlipidemia: Secondary | ICD-10-CM | POA: Diagnosis not present

## 2019-12-09 DIAGNOSIS — M1712 Unilateral primary osteoarthritis, left knee: Secondary | ICD-10-CM | POA: Diagnosis not present

## 2019-12-10 ENCOUNTER — Encounter: Payer: Self-pay | Admitting: Gastroenterology

## 2019-12-10 ENCOUNTER — Telehealth: Payer: Self-pay | Admitting: Gastroenterology

## 2019-12-10 ENCOUNTER — Ambulatory Visit: Payer: PPO | Admitting: Gastroenterology

## 2019-12-10 ENCOUNTER — Other Ambulatory Visit: Payer: Self-pay

## 2019-12-10 VITALS — BP 141/85 | HR 77 | Temp 97.5°F | Ht 70.0 in | Wt 223.2 lb

## 2019-12-10 DIAGNOSIS — Z86010 Personal history of colonic polyps: Secondary | ICD-10-CM | POA: Diagnosis not present

## 2019-12-10 DIAGNOSIS — K641 Second degree hemorrhoids: Secondary | ICD-10-CM

## 2019-12-10 NOTE — Telephone Encounter (Signed)
After further review of chart, patient needs early interval colonoscopy with Dr. Gala Romney due to large colon polyp removed via piecemeal fashion in March 2021. Around Sept 2021 would be great.  Needs with Propofol. ASA class II.

## 2019-12-10 NOTE — Telephone Encounter (Signed)
Will call pt to schedule TCS when September schedule is available.

## 2019-12-10 NOTE — Progress Notes (Signed)
Referring Provider: Redmond School, MD Primary Care Physician:  Redmond School, MD Primary GI: Dr. Gala Romney   Chief Complaint  Patient presents with  . Anal Itching    and burning    HPI:   Casey Castillo is a 74 y.o. male presenting today with a history of symptomatic hemorrhoids s/p banding earlier this year, recent colonoscopy with numerous polyps and one large 25 mm polyp removed via piecemeal fashion in March 2021.   Notes small amount of itching of rectum at times every now and then. Uses Tucks pads prn. Prep H wipes. No prolapsing. Occasional red tint. Some burning/itching of outside. Feels like it is right below the anus. Chronic.   No abdominal pain, constipation, straining, lack of appetite, dysphagia, GERD exacerbation.    Past Medical History:  Diagnosis Date  . Arthritis   . Cervical myelopathy with cervical radiculopathy   . Claustrophobia   . Coronary artery disease    5%blockage on left anterior descending Dr. Sallyanne Kuster   . Diverticulitis    hx of  . Enlarged prostate    takes flomax  . GERD (gastroesophageal reflux disease)   . History of blood clots    november, 5 1995- left leg; 2014 - left calf  . History of kidney stones   . Hyperlipidemia    primary Dr. Selena Batten  . Pneumonia    as a child  1 YEARS OLD  . PTSD (post-traumatic stress disorder)   . PTSD (post-traumatic stress disorder)   . Seasonal allergies   . Urinary frequency    hx of     Past Surgical History:  Procedure Laterality Date  . ANTERIOR CERVICAL DECOMP/DISCECTOMY FUSION  01/28/2012   Procedure: ANTERIOR CERVICAL DECOMPRESSION/DISCECTOMY FUSION 2 LEVELS;  Surgeon: Kristeen Miss, MD;  Location: [place] [facility];  Service: Neurosurgery;  Laterality: N/A;  Cervical six-seven, cervical seven-thoracic one Anterior cervical decompression/diskectomy/fusion  . ANTERIOR CERVICAL DECOMP/DISCECTOMY FUSION N/A 12/24/2017   Procedure: ANTERIOR CERVICAL DECOMPRESSION/DISCECTOMY FUSION  CERVICAL FOUR-FIVE/CERVICAL FIVE-SIX TWO LEVELS;  Surgeon: Kristeen Miss, MD;  Location: [place];  Service: Neurosurgery;  Laterality: N/A;  ANTERIOR CERVICAL DECOMPRESSION/DISCECTOMY FUSION CERVICAL FOUR-FIVE/CERVICAL FIVE-SIX TWO LEVELS  . CARDIAC CATHETERIZATION     2006  . CARDIOVASCULAR STRESS TEST     2006 and 2008 at [facility] heart and vascular, 2011 myoview and echo  . COLONOSCOPY    . COLONOSCOPY N/A 08/30/2015   Dr. Arnoldo Morale: one 20 mm polyp in proximal ascending colon, removed piecemeal using hot snare. Resected and retrieved. Diverticulosis in sigmoid colon. Path with tubular adenoma, no high grade dysplasia. 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 COLONOSCOPY N/A 08/26/2019   sigmoid and descending colon diverticulosis, seven 3-7 mm polyps in cecum, (tubular adenomas and sessile serrated) four 4-6 mm polyps in ascending colon (tubular adenomas), two 4-6 mm polyps in sigmoid and splenic flexure (tubular adenomas), one 25 mm polyp in ascending colon (tubular adenoma) removed via piecemeal fashion  . engrown  toenail Bilateral   . EYE SURGERY     Laser for torn retina  . HEMORRHOID SURGERY     sept 8, 2008  . KIDNEY STONE SURGERY     lithotripsy 1980's  . KNEE SURGERY     left 04-11-94 ( has had two surgeries to left knee)  . NASAL SINUS SURGERY     Jun 30 1998  . NM MYOVIEW LTD  07/20/2009   no ischemia  . PILONIDAL CYST /  SINUS EXCISION     surgery 01-08-74  . POLYPECTOMY  08/26/2019   Procedure: POLYPECTOMY;  Surgeon: Daneil Dolin, MD;  [facility];  Service: Endoscopy;;  . RADIOLOGY WITH ANESTHESIA N/A 06/12/2018   Procedure: MRI OF CHEST WITH AND WITHOUT CONTRAST;  Surgeon: Radiologist, Medication, MD;  Location: [place];  Service: Radiology;  Laterality: N/A;  . SHOULDER SURGERY     right 12-20-2003  . TOTAL KNEE ARTHROPLASTY Left 09/15/2019   Procedure: TOTAL KNEE ARTHROPLASTY;  Surgeon: Renette Butters, MD;  [facility];  Service:  Orthopedics;  Laterality: Left;  . TRIGGER FINGER RELEASE     03-21-98 right hand ring finger  . US ECHOCARDIOGRAPHY  07/20/2009   LA & RA mildly dilated,trace MR,TR.    Current Outpatient Medications  Medication Sig Dispense Refill  . acetaminophen (TYLENOL) 650 MG CR tablet Take 650-1,300 mg by mouth every 8 (eight) hours as needed for pain.     . Biotin 5000 MCG TABS Take 5,000 mcg by mouth daily.     . calcium elemental as carbonate (TUMS ULTRA 1000) 400 MG chewable tablet Chew 2,000-3,000 mg by mouth daily as needed for heartburn.    . Cholecalciferol (VITAMIN D) 2000 UNITS CAPS Take 2,000 Units by mouth at bedtime.     . clobetasol cream (TEMOVATE) 7.56 % Apply 1 application topically 2 (two) times daily as needed (dry skin).    Marland Kitchen diphenhydrAMINE (BENADRYL) 50 MG tablet Take 50 mg by mouth at bedtime as needed for sleep (sleep.).     Marland Kitchen Emollient (AQUAPHOR EX) Apply 1 application topically 3 (three) times daily as needed (severe dry skin.).     Marland Kitchen loratadine (CLARITIN) 10 MG tablet Take 10 mg by mouth at bedtime.     Marland Kitchen Lysine HCl 500 MG TABS Take 500 mg by mouth at bedtime.     . Melatonin 5 MG TABS Take 5 mg by mouth at bedtime as needed (sleep).    . Omega-3 Fatty Acids (FISH OIL) 1200 MG CAPS Take 1,200 mg by mouth at bedtime.    Marland Kitchen oxybutynin (DITROPAN) 5 MG tablet Take 1 tablet (5 mg total) by mouth daily. (Patient taking differently: Take 2.5 mg by mouth daily. ) 30 tablet 11  . pantoprazole (PROTONIX) 40 MG tablet Take 40 mg by mouth 2 (two) times daily.   2  . simvastatin (ZOCOR) 40 MG tablet Take 1 tablet by mouth daily 90 tablet 0  . tadalafil (CIALIS) 5 MG tablet Take 5 mg by mouth every evening.     . tamsulosin (FLOMAX) 0.4 MG CAPS capsule Take 1 capsule (0.4 mg total) by mouth 2 (two) times daily. 180 capsule 3  . valACYclovir (VALTREX) 1000 MG tablet Take 500 mg by mouth daily as needed (fever blisters.).      No current facility-administered medications for this visit.     Allergies as of 12/10/2019 - Review Complete 12/10/2019  Allergen Reaction Noted  . Adhesive [tape] Hives, Rash, and Other (See Comments) 05/26/2013    Family History  Problem Relation Age of Onset  . Heart attack Father   . Dementia Mother   . Heart disease Other   . Colon cancer Neg Hx     Social History   Socioeconomic History  . Marital status: Widowed    Spouse name: Not on file  . Number of children: Not on file  . Years of education: Not on file  . Highest education level: Not on file  Occupational History  . Not on file  Tobacco Use  . Smoking status: Former Smoker    Packs/day: 1.00    Years: 20.00    Pack years: 20.00    Types: Cigarettes    Quit date: 1972    Years since quitting: 49.5  . Smokeless tobacco: Never Used  . Tobacco comment: quit smokin cigarettes at age 37  Vaping Use  . Vaping Use: Never used  Substance and Sexual Activity  . Alcohol use: No  . Drug use: No  . Sexual activity: Not Currently  Other Topics Concern  . Not on file  Social History Narrative  . Not on file   Social Determinants of Health   Financial Resource Strain:   . Difficulty of Paying Living Expenses:   Food Insecurity:   . Worried About Charity fundraiser in the Last Year:   . Arboriculturist in the Last Year:   Transportation Needs:   . Film/video editor (Medical):   Marland Kitchen Lack of Transportation (Non-Medical):   Physical Activity:   . Days of Exercise per Week:   . Minutes of Exercise per Session:   Stress:   . Feeling of Stress :   Social Connections:   . Frequency of Communication with Friends and Family:   . Frequency of Social Gatherings with Friends and Family:   . Attends Religious Services:   . Active Member of Clubs or Organizations:   . Attends Archivist Meetings:   Marland Kitchen Marital Status:     Review of Systems: Gen: Denies fever, chills, anorexia. Denies fatigue, weakness, weight loss.  CV: Denies chest pain, palpitations,  syncope, peripheral edema, and claudication. Resp: Denies dyspnea at rest, cough, wheezing, coughing up blood, and pleurisy. GI: see HPI Derm: Denies rash, itching, dry skin Psych: Denies depression, anxiety, memory loss, confusion. No homicidal or suicidal ideation.  Heme: Denies bruising, bleeding, and enlarged lymph nodes.  Physical Exam: BP (!) 141/85   Pulse 77   Temp (!) 97.5 F (36.4 C) (Oral)   Ht 5\' 10"  (1.778 m)   Wt 223 lb 3.2 oz (101.2 kg)   BMI 32.03 kg/m  General:   Alert and oriented. No distress noted. Pleasant and cooperative.  Head:  Normocephalic and atraumatic. Eyes:  Conjuctiva clear without scleral icterus. Mouth:  Mask in place Rectal: no obvious external hemorrhoids or rash. Internal exam with questionable redundant tissue. Decision made to band neutrally. See procedure note below.  Abdomen:  +BS, soft, non-tender and non-distended. No rebound or guarding. No HSM or masses noted. Msk:  Symmetrical without gross deformities. Normal posture. Extremities:  Without edema. Neurologic:  Alert and  oriented x4 Psych:  Alert and cooperative. Normal mood and affect.  [facility] Banding Note:  The patient presents with symptomatic grade 2 hemorrhoids, unresponsive to maximal medical therapy, requesting rubber band ligation of hishemorrhoidal disease. All risks, benefits, and alternative forms of therapy were described and informed consent was obtained.  The decision was made to band neutrally, and the [place] was used to perform band ligation without complication. Digital anorectal examination was then performed to assure proper positioning of the band, and no adjustment needed. Appears to be in right anterior position. The patient was discharged home without pain or other issues. Dietary and behavioral recommendations were given, along with follow-up instructions.   No complications were encountered and the patient tolerated the procedure  well.   ASSESSMENT: Casey Castillo is a 74 y.o.  male presenting today with history of numerous polyps historically, most recent colonoscopy March 2021 with multiple polyps and a large 25 mm polyp removed in piecemeal fashion, symptomatic hemorrhoids.  He is noting some minor hemorrhoid symptoms today, and he has had all 3 columns banded earlier this year. I elected to band neutrally, and he tolerated this well.   Large polyp removed via piecemeal fashion: recommend early interval surveillance, which would be Sept 2021. We will arrange this in near future.    PLAN:   Banding completed today  Early interval colonoscopy in near future using Propofol. Proceed with TCS with Dr. Gala Romney in near future: the risks, benefits, and alternatives have been discussed with the patient in detail. The patient states understanding and desires to proceed.  Return in 6 months  Annitta Needs, PhD, [facility]

## 2019-12-10 NOTE — Patient Instructions (Signed)
Please let me know if you have more issues!  Continue to avoid straining, avoid constipation.  We will see you in 6 months!  I enjoyed seeing you again today! As you know, I value our relationship and want to provide genuine, compassionate, and quality care. I welcome your feedback. If you receive a survey regarding your visit,  I greatly appreciate you taking time to fill this out. See you next time!  Annitta Needs, PhD, ANP-BC [facility]

## 2019-12-15 NOTE — Telephone Encounter (Signed)
Spoke with patient and he states RMR advised him he was not due for another TCS for 3 years. I advised he was due since a large colon polyps was removed. He wants clarification because he was not told this but was told he would need in 3 years.

## 2019-12-16 NOTE — Telephone Encounter (Signed)
I clarified again with Dr. Gala Romney. Definitely still needs to be on surveillance in 3 years due to multiple polyps, but 6 months early interval is still indicated due to large polyp removed via piecemeal fashion.

## 2019-12-16 NOTE — Telephone Encounter (Signed)
Called pt and made aware of below. He voiced understanding. He is aware will call with September schedule

## 2019-12-24 ENCOUNTER — Encounter: Payer: Self-pay | Admitting: Cardiovascular Disease

## 2019-12-24 ENCOUNTER — Ambulatory Visit: Payer: PPO | Admitting: Cardiovascular Disease

## 2019-12-24 ENCOUNTER — Other Ambulatory Visit: Payer: Self-pay

## 2019-12-24 VITALS — BP 119/68 | HR 69 | Ht 70.0 in | Wt 226.6 lb

## 2019-12-24 DIAGNOSIS — E7800 Pure hypercholesterolemia, unspecified: Secondary | ICD-10-CM | POA: Diagnosis not present

## 2019-12-24 DIAGNOSIS — Z86718 Personal history of other venous thrombosis and embolism: Secondary | ICD-10-CM

## 2019-12-24 DIAGNOSIS — G5692 Unspecified mononeuropathy of left upper limb: Secondary | ICD-10-CM

## 2019-12-24 DIAGNOSIS — I2510 Atherosclerotic heart disease of native coronary artery without angina pectoris: Secondary | ICD-10-CM

## 2019-12-24 DIAGNOSIS — E669 Obesity, unspecified: Secondary | ICD-10-CM

## 2019-12-24 NOTE — Patient Instructions (Signed)

## 2019-12-24 NOTE — Progress Notes (Signed)
Patient ID: Casey Castillo, male   DOB: June 14, 1945, 74 y.o.   MRN: [id number]    Cardiology Office Note    Date:  12/24/2019   ID:  Casey Castillo, DOB [birth year], MRN [id number]  PCP:  Redmond School, MD  Cardiologist:   Sanda Klein, MD   Chief Complaint  Patient presents with  . Hyperlipidemia  . Coronary Artery Disease    History of Present Illness:  Casey Castillo is a 74 y.o. male with a history of remote DVT of the lower extremity, no longer on anticoagulation, coronary atherosclerosis without severe stenoses (remote coronary angiography) and hyperlipidemia.  He has not had any cardiovascular events since his last appointment.  He is undergone 2 surgeries a left total knee replacement on April 6, after which he took Eliquis for about a month but has since stopped.  He also underwent anterior cervical decompression discectomy with Dr. Ellene Route.  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  Reportedly this was related to injury of the nerve when IV was placed in his antecubital area, based on nerve conduction studies.  He exercise on his elliptical sometimes several times a day but for brief periods between 5 and 20 minutes.  He denies angina or dyspnea with this activity.  He does not have orthopnea, PND or leg edema.  Erectile dysfunction is well compensated with Cialis.  He does not have claudication and other than the problems with his left upper extremity following surgery, has not had any focal neurological events.  He has gained weight during the Covid pandemic.  His most recent lipid profile showed generally satisfactory findings with the total cholesterol 149 and HDL 46, LDL 85, triglycerides 97, other labs normal.  Past Medical History:  Diagnosis Date  . Arthritis   . Cervical myelopathy with cervical radiculopathy   . Claustrophobia   . Coronary artery disease    5%blockage on left anterior descending Dr. Sallyanne Kuster    . Diverticulitis    hx of  . Enlarged prostate    takes flomax  . GERD (gastroesophageal reflux disease)   . History of blood clots    november, 5 1995- left leg; 2014 - left calf  . History of kidney stones   . Hyperlipidemia    primary Dr. Selena Batten  . Pneumonia    as a child  28 YEARS OLD  . PTSD (post-traumatic stress disorder)   . PTSD (post-traumatic stress disorder)   . Seasonal allergies   . Urinary frequency    hx of     Past Surgical History:  Procedure Laterality Date  . ANTERIOR CERVICAL DECOMP/DISCECTOMY FUSION  01/28/2012   Procedure: ANTERIOR CERVICAL DECOMPRESSION/DISCECTOMY FUSION 2 LEVELS;  Surgeon: Kristeen Miss, MD;  Location: [place] [facility];  Service: Neurosurgery;  Laterality: N/A;  Cervical six-seven, cervical seven-thoracic one Anterior cervical decompression/diskectomy/fusion  . ANTERIOR CERVICAL DECOMP/DISCECTOMY FUSION N/A 12/24/2017   Procedure: ANTERIOR CERVICAL DECOMPRESSION/DISCECTOMY FUSION CERVICAL FOUR-FIVE/CERVICAL FIVE-SIX TWO LEVELS;  Surgeon: Kristeen Miss, MD;  Location: [facility];  Service: Neurosurgery;  Laterality: N/A;  ANTERIOR CERVICAL DECOMPRESSION/DISCECTOMY FUSION CERVICAL FOUR-FIVE/CERVICAL FIVE-SIX TWO LEVELS  . CARDIAC CATHETERIZATION     2006  . CARDIOVASCULAR STRESS TEST     2006 and 2008 at [facility] heart [facility] vascular, 2011 myoview and echo  . COLONOSCOPY    . COLONOSCOPY N/A 08/30/2015   Dr. Arnoldo Morale: one 20 mm polyp in proximal ascending colon, removed piecemeal using hot snare.  Resected and retrieved. Diverticulosis in sigmoid colon. Path with tubular adenoma, no high grade dysplasia. 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 COLONOSCOPY N/A 08/26/2019   sigmoid and descending colon diverticulosis, seven 3-7 mm polyps in cecum, (tubular adenomas and sessile serrated) four 4-6 mm polyps in ascending colon (tubular adenomas), two 4-6 mm polyps in sigmoid and splenic flexure (tubular adenomas), one 25 mm polyp  in ascending colon (tubular adenoma) removed via piecemeal fashion  . engrown  toenail Bilateral   . EYE SURGERY     Laser for torn retina  . HEMORRHOID SURGERY     sept 8, 2008  . KIDNEY STONE SURGERY     lithotripsy 1980's  . KNEE SURGERY     left 04-11-94 ( has had two surgeries to left knee)  . NASAL SINUS SURGERY     Jun 30 1998  . NM MYOVIEW LTD  07/20/2009   no ischemia  . PILONIDAL CYST / SINUS EXCISION     surgery 01-08-74  . POLYPECTOMY  08/26/2019   Procedure: POLYPECTOMY;  Surgeon: Daneil Dolin, MD;  Location[facility];  Service: Endoscopy;;  . RADIOLOGY WITH ANESTHESIA N/A 06/12/2018   Procedure: MRI OF CHEST WITH AND WITHOUT CONTRAST;  Surgeon: Radiologist, Medication, MD;  Location: [place];  Service: Radiology;  Laterality: N/A;  . SHOULDER SURGERY     right 12-20-2003  . TOTAL KNEE ARTHROPLASTY Left 09/15/2019   Procedure: TOTAL KNEE ARTHROPLASTY;  Surgeon: Renette Butters, MD;  Location: [facility];  Service: Orthopedics;  Laterality: Left;  . TRIGGER FINGER RELEASE     03-21-98 right hand ring finger  . US ECHOCARDIOGRAPHY  07/20/2009   LA & RA mildly dilated,trace MR,TR.    Current Outpatient Medications  Medication Sig Dispense Refill  . acetaminophen (TYLENOL) 650 MG CR tablet Take 650-1,300 mg by mouth every 8 (eight) hours as needed for pain.     . Biotin 5000 MCG TABS Take 5,000 mcg by mouth daily.     . calcium elemental as carbonate (TUMS ULTRA 1000) 400 MG chewable tablet Chew 2,000-3,000 mg by mouth daily as needed for heartburn.    . Cholecalciferol (VITAMIN D) 2000 UNITS CAPS Take 2,000 Units by mouth at bedtime.     . clobetasol cream (TEMOVATE) 2.72 % Apply 1 application topically 2 (two) times daily as needed (dry skin).    Marland Kitchen diphenhydrAMINE (BENADRYL) 50 MG tablet Take 50 mg by mouth at bedtime as needed for sleep (sleep.).     Marland Kitchen Emollient (AQUAPHOR EX) Apply 1 application topically 3 (three) times daily as needed (severe dry skin.).     Marland Kitchen loratadine  (CLARITIN) 10 MG tablet Take 10 mg by mouth at bedtime.     Marland Kitchen Lysine HCl 500 MG TABS Take 500 mg by mouth at bedtime.     . Melatonin 5 MG TABS Take 5 mg by mouth at bedtime as needed (sleep).    . Omega-3 Fatty Acids (FISH OIL) 1200 MG CAPS Take 1,200 mg by mouth at bedtime.    Marland Kitchen oxybutynin (DITROPAN) 5 MG tablet Take 1 tablet (5 mg total) by mouth daily. (Patient taking differently: Take 2.5 mg by mouth daily. ) 30 tablet 11  . pantoprazole (PROTONIX) 40 MG tablet Take 40 mg by mouth 2 (two) times daily.   2  . simvastatin (ZOCOR) 40 MG tablet Take 1 tablet by mouth daily 90 tablet 0  . tadalafil (CIALIS) 5 MG tablet Take 5  mg by mouth every evening.     . tamsulosin (FLOMAX) 0.4 MG CAPS capsule Take 1 capsule (0.4 mg total) by mouth 2 (two) times daily. 180 capsule 3  . valACYclovir (VALTREX) 1000 MG tablet Take 500 mg by mouth daily as needed (fever blisters.).      No current facility-administered medications for this visit.    Allergies:   Adhesive [tape]   Social History   Socioeconomic History  . Marital status: Widowed    Spouse name: Not on file  . Number of children: Not on file  . Years of education: Not on file  . Highest education level: Not on file  Occupational History  . Not on file  Tobacco Use  . Smoking status: Former Smoker    Packs/day: 1.00    Years: 20.00    Pack years: 20.00    Types: Cigarettes    Quit date: 1972    Years since quitting: 49.5  . Smokeless tobacco: Never Used  . Tobacco comment: quit smokin cigarettes at age 41  Vaping Use  . Vaping Use: Never used  Substance and Sexual Activity  . Alcohol use: No  . Drug use: No  . Sexual activity: Not Currently  Other Topics Concern  . Not on file  Social History Narrative  . Not on file   Social Determinants of Health   Financial Resource Strain:   . Difficulty of Paying Living Expenses:   Food Insecurity:   . Worried About Charity fundraiser in the Last Year:   . Arboriculturist in  the Last Year:   Transportation Needs:   . Film/video editor (Medical):   Marland Kitchen Lack of Transportation (Non-Medical):   Physical Activity:   . Days of Exercise per Week:   . Minutes of Exercise per Session:   Stress:   . Feeling of Stress :   Social Connections:   . Frequency of Communication with Friends and Family:   . Frequency of Social Gatherings with Friends and Family:   . Attends Religious Services:   . Active Member of Clubs or Organizations:   . Attends Archivist Meetings:   Marland Kitchen Marital Status:      Family History:  The patient's family history includes Dementia in his mother; Heart attack in his father; Heart disease in an other family member.   ROS:   Please see the history of present illness.    Review of Systems  Musculoskeletal: Positive for arthritis, joint pain and joint swelling.  All other systems are reviewed and are negative.   PHYSICAL EXAM:   VS:  BP 119/68   Pulse 69   Ht 5\' 10"  (1.778 m)   Wt 226 lb 9.6 oz (102.8 kg)   SpO2 98%   BMI 32.51 kg/m     General: Alert, oriented x3, no distress, mildly obese Head: no evidence of trauma, PERRL, EOMI, no exophtalmos or lid lag, no myxedema, no xanthelasma; normal ears, nose and oropharynx Neck: normal jugular venous pulsations and no hepatojugular reflux; brisk carotid pulses without delay and no carotid bruits Chest: clear to auscultation, no signs of consolidation by percussion or palpation, normal fremitus, symmetrical and full respiratory excursions Cardiovascular: normal position and quality of the apical impulse, regular rhythm, normal first and second heart sounds, no murmurs, rubs or gallops Abdomen: no tenderness or distention, no masses by palpation, no abnormal pulsatility or arterial bruits, normal bowel sounds, no hepatosplenomegaly Extremities: no clubbing, cyanosis or edema;  there is evidence of muscle atrophy in the left forearm as well as both the volar and dorsal hand muscles; 2+  radial, ulnar and brachial pulses bilaterally; 2+ right femoral, posterior tibial and dorsalis pedis pulses; 2+ left femoral, posterior tibial and dorsalis pedis pulses; no subclavian or femoral bruits Neurological: Diminished grip strength left hand Psych: Normal mood and affect   Wt Readings from Last 3 Encounters:  12/24/19 226 lb 9.6 oz (102.8 kg)  12/10/19 223 lb 3.2 oz (101.2 kg)  09/15/19 223 lb 1.7 oz (101.2 kg)      Studies/Labs Reviewed:   EKG:  EKG is for today shows normal sinus rhythm, voltage only criteria for LVH, borderline first-degree AV block  Recent Labs:  09/04/2019 hemoglobin 14.3, creatinine 1.08, potassium 4.2  Lipid Panel 07/31/2019 total cholesterol 149, HDL 46, LDL 85, triglycerides 97   ASSESSMENT:    1. Coronary artery disease involving native coronary artery of native heart without angina pectoris   2. Hypercholesterolemia   3. Mild obesity   4. History of deep vein thrombosis (DVT) of lower extremity   5. Neuropathy of left upper extremity      PLAN:  In order of problems listed above:    1. CAD:  Physically active, does not have angina pectoris.  Continue aspirin and statin. 2. HLP:  Lipid parameters are better than they were last year, but LDL is still not at target less than 70.  He promises to increase physical activity and lose more weight. 3. Obesity: Like many of our patients, he gained weight during the Covid pandemic but is intent on reversing this problem. 4. Hx of DVT:  Remote, no recurrence, no longer on full anticoagulation. 5. Left upper extremity neuropathy: Has evidence of peripheral nerve injury with atrophy of muscles in his left forearm and hand.  He had motor problems in this distribution even before his cervical spine injury and then apparently had some type of injury to the median nerve during placement of an IV.  Medication Adjustments/Labs and Tests Ordered: Current medicines are reviewed at length with the patient  today.  Concerns regarding medicines are outlined above.  Medication changes, Labs and Tests ordered today are listed below. Patient Instructions  Medication Instructions:  No changes *If you need a refill on your cardiac medications before your next appointment, please call your pharmacy*   Lab Work: None ordered If you have labs (blood work) drawn today and your tests are completely normal, you will receive your results only by: Marland Kitchen MyChart Message (if you have MyChart) OR . A paper copy in the mail If you have any lab test that is abnormal or we need to change your treatment, we will call you to review the results.   Testing/Procedures: None ordered   Follow-Up: At [facility], you and your health needs are our priority.  As part of our continuing mission to provide you with exceptional heart care, we have created designated Provider Care Teams.  These Care Teams include your primary Cardiologist (physician) and Advanced Practice Providers (APPs -  Physician Assistants and Nurse Practitioners) who all work together to provide you with the care you need, when you need it.  We recommend signing up for the patient portal called "MyChart".  Sign up information is provided on this After Visit Summary.  MyChart is used to connect with patients for Virtual Visits (Telemedicine).  Patients are able to view lab/test results, encounter notes, upcoming appointments, etc.  Non-urgent messages can be  sent to your provider as well.   To learn more about what you can do with MyChart, go to [email or web address].    Your next appointment:   12 month(s)  The format for your next appointment:   In Person  Provider:   You may see Sanda Klein, MD or one of the following Advanced Practice Providers on your designated Care Team:    Almyra Deforest, PA-C  Fabian Sharp, [place] or   Roby Lofts, PA-C     Signed, Sanda Klein, MD  12/24/2019 6:50 PM    [address], [place], [place]  [address] Phone: [phone]; Fax: [phone]

## 2020-01-14 ENCOUNTER — Telehealth: Payer: Self-pay | Admitting: *Deleted

## 2020-01-14 NOTE — Telephone Encounter (Signed)
Called pt and he is scheduled for TCS with Dr. Gala Romney propofol, ASA 2. He is scheduled for 9/16 at 10:30am, covid scheduled for 9/15 at 8:00am. Patient requests miralax prep again. Advised I will mail these instructions to him.

## 2020-01-26 DIAGNOSIS — F4312 Post-traumatic stress disorder, chronic: Secondary | ICD-10-CM | POA: Diagnosis not present

## 2020-02-11 ENCOUNTER — Other Ambulatory Visit: Payer: Self-pay | Admitting: Cardiovascular Disease

## 2020-02-11 MED ORDER — SIMVASTATIN 40 MG PO TABS: 40.0000 mg | ORAL_TABLET | Freq: Every day | ORAL | 3 refills | Status: DC

## 2020-02-11 NOTE — Telephone Encounter (Signed)
Rx(s) sent to pharmacy electronically.  

## 2020-02-11 NOTE — Telephone Encounter (Signed)
   *  STAT* If patient is at the pharmacy, call can be transferred to refill team.   1. Which medications need to be refilled? (please list name of each medication and dose if known)   simvastatin (ZOCOR) 40 MG tablet    2. Which pharmacy/location (including street and city if local pharmacy) is medication to be sent to? [facility] - [place], [place]  3. Do they need a 30 day or 90 day supply? 90 days

## 2020-02-17 ENCOUNTER — Telehealth: Payer: Self-pay

## 2020-02-17 ENCOUNTER — Telehealth: Payer: Self-pay | Admitting: Internal Medicine

## 2020-02-17 NOTE — Telephone Encounter (Signed)
Spoke with pt. Pt was notified of Gala Romney, NP recommendations of going to the ED if pt has larger amount of blood, lightheaded, dizziness sever pain, light headed pt had another BM and there was no bleeding. Pt will call back if bleeding starts back and we can order the CBS per pt. Marland Kitchen

## 2020-02-17 NOTE — Telephone Encounter (Signed)
If he has persistent cramping and bleeding, need stat CBC. If large volume bleeding with associated symptoms such as dizziness, severe pain, light-headed, etc., seek medical attention.

## 2020-02-17 NOTE — Telephone Encounter (Signed)
Prior banding early this year X 3 and then again in neutral position. History of polyp removed via piecemeal fashion earlier this year. Keep plans for colonoscopy. Avoid straining, monitor symptoms for now. Likely benign source. If bleeding persists, call us.

## 2020-02-17 NOTE — Telephone Encounter (Signed)
Pt is concerned about the cramping prior to having a BM and blood in toilet he states.  Pt is aware of Vicente Males Boone's recommendations to keep plans for colonoscopy, avoid straining and monitor symptoms.  Pt states he hasn't seen any blood in his stool in several years and this is very concerning per pt. Pt says he needs to have another BM and he will call our office back if blood is in the toilet.

## 2020-02-17 NOTE — Telephone Encounter (Signed)
Patient called back asking if the nurse has heard anything.  Please call back

## 2020-02-17 NOTE — Telephone Encounter (Signed)
Pt called and had a TCS scheduled for next week. Pt woke up around 12:30 AM with lower abdominal pain and had a BM. When pt wiped with tissue, he saw a small amount of blood on the tissue. This morning around 8:30 AM, pt had a BM with out lower abdominal pain. Stool was soft and pt noticed blood in the toilet.pt did strain and noticed blood clots on the tissue as he wiped. Pt isn't feeling lightheaded, no n/v, no abdominal pain. Pt was very concerned since the toilet had lots of bright red blood in it.

## 2020-02-18 DIAGNOSIS — H43392 Other vitreous opacities, left eye: Secondary | ICD-10-CM | POA: Diagnosis not present

## 2020-02-18 DIAGNOSIS — H524 Presbyopia: Secondary | ICD-10-CM | POA: Diagnosis not present

## 2020-02-24 ENCOUNTER — Other Ambulatory Visit: Payer: Self-pay

## 2020-02-24 ENCOUNTER — Other Ambulatory Visit (HOSPITAL_COMMUNITY)
Admission: RE | Admit: 2020-02-24 | Discharge: 2020-02-24 | Disposition: A | Discharge status: Home / Self Care | Payer: PPO | Source: Ambulatory Visit | Attending: Internal Medicine | Admitting: Internal Medicine

## 2020-02-24 DIAGNOSIS — Z01812 Encounter for preprocedural laboratory examination: Secondary | ICD-10-CM | POA: Diagnosis not present

## 2020-02-24 DIAGNOSIS — Z20822 Contact with and (suspected) exposure to covid-19: Secondary | ICD-10-CM | POA: Insufficient documentation

## 2020-02-24 LAB — SARS CORONAVIRUS 2 (TAT 6-24 HRS): SARS Coronavirus 2: NEGATIVE

## 2020-02-25 ENCOUNTER — Ambulatory Visit (HOSPITAL_COMMUNITY)
Admission: RE | Admit: 2020-02-25 | Discharge: 2020-02-25 | Disposition: A | Discharge status: Home / Self Care | Payer: PPO | Attending: Internal Medicine | Admitting: Internal Medicine

## 2020-02-25 ENCOUNTER — Encounter (HOSPITAL_COMMUNITY)
Admission: RE | Disposition: A | Discharge status: Home / Self Care | Payer: Self-pay | Source: Home / Self Care | Attending: Internal Medicine

## 2020-02-25 ENCOUNTER — Other Ambulatory Visit: Payer: Self-pay

## 2020-02-25 ENCOUNTER — Encounter (HOSPITAL_COMMUNITY): Payer: Self-pay | Admitting: Internal Medicine

## 2020-02-25 ENCOUNTER — Ambulatory Visit (HOSPITAL_COMMUNITY): Payer: PPO | Admitting: Anesthesiology

## 2020-02-25 DIAGNOSIS — K219 Gastro-esophageal reflux disease without esophagitis: Secondary | ICD-10-CM | POA: Insufficient documentation

## 2020-02-25 DIAGNOSIS — Z87891 Personal history of nicotine dependence: Secondary | ICD-10-CM | POA: Diagnosis not present

## 2020-02-25 DIAGNOSIS — K5730 Diverticulosis of large intestine without perforation or abscess without bleeding: Secondary | ICD-10-CM | POA: Diagnosis not present

## 2020-02-25 DIAGNOSIS — D120 Benign neoplasm of cecum: Secondary | ICD-10-CM | POA: Diagnosis not present

## 2020-02-25 DIAGNOSIS — E785 Hyperlipidemia, unspecified: Secondary | ICD-10-CM | POA: Diagnosis not present

## 2020-02-25 DIAGNOSIS — Z86010 Personal history of colonic polyps: Secondary | ICD-10-CM | POA: Insufficient documentation

## 2020-02-25 DIAGNOSIS — Z09 Encounter for follow-up examination after completed treatment for conditions other than malignant neoplasm: Secondary | ICD-10-CM | POA: Diagnosis not present

## 2020-02-25 DIAGNOSIS — K635 Polyp of colon: Secondary | ICD-10-CM

## 2020-02-25 DIAGNOSIS — Z96652 Presence of left artificial knee joint: Secondary | ICD-10-CM | POA: Diagnosis not present

## 2020-02-25 DIAGNOSIS — N400 Enlarged prostate without lower urinary tract symptoms: Secondary | ICD-10-CM | POA: Insufficient documentation

## 2020-02-25 DIAGNOSIS — Z7982 Long term (current) use of aspirin: Secondary | ICD-10-CM | POA: Diagnosis not present

## 2020-02-25 DIAGNOSIS — Z79899 Other long term (current) drug therapy: Secondary | ICD-10-CM | POA: Diagnosis not present

## 2020-02-25 DIAGNOSIS — K649 Unspecified hemorrhoids: Secondary | ICD-10-CM | POA: Insufficient documentation

## 2020-02-25 DIAGNOSIS — D128 Benign neoplasm of rectum: Secondary | ICD-10-CM | POA: Diagnosis not present

## 2020-02-25 DIAGNOSIS — K621 Rectal polyp: Secondary | ICD-10-CM | POA: Diagnosis not present

## 2020-02-25 DIAGNOSIS — I2510 Atherosclerotic heart disease of native coronary artery without angina pectoris: Secondary | ICD-10-CM | POA: Insufficient documentation

## 2020-02-25 HISTORY — PX: COLONOSCOPY WITH PROPOFOL: SHX5780

## 2020-02-25 HISTORY — PX: POLYPECTOMY: SHX5525

## 2020-02-25 SURGERY — COLONOSCOPY WITH PROPOFOL
Anesthesia: General

## 2020-02-25 MED ORDER — CHLORHEXIDINE GLUCONATE CLOTH 2 % EX PADS: 6.0000 | MEDICATED_PAD | Freq: Once | CUTANEOUS | Status: DC

## 2020-02-25 MED ORDER — STERILE WATER FOR IRRIGATION IR SOLN
Status: DC | PRN
  Administered 2020-02-25: 100 mL

## 2020-02-25 MED ORDER — KETAMINE HCL 50 MG/5ML IJ SOSY
PREFILLED_SYRINGE | INTRAMUSCULAR | Status: AC
  Filled 2020-02-25: qty 5

## 2020-02-25 MED ORDER — LACTATED RINGERS IV SOLN: INTRAVENOUS | Status: DC | PRN

## 2020-02-25 MED ORDER — PROPOFOL 500 MG/50ML IV EMUL
INTRAVENOUS | Status: DC | PRN
  Administered 2020-02-25: 150 ug/kg/min via INTRAVENOUS

## 2020-02-25 MED ORDER — LIDOCAINE HCL (CARDIAC) PF 100 MG/5ML IV SOSY
PREFILLED_SYRINGE | INTRAVENOUS | Status: DC | PRN
  Administered 2020-02-25: 50 mg via INTRATRACHEAL

## 2020-02-25 MED ORDER — KETAMINE HCL 10 MG/ML IJ SOLN
INTRAMUSCULAR | Status: DC | PRN
  Administered 2020-02-25: 30 mg via INTRAVENOUS

## 2020-02-25 MED ORDER — LACTATED RINGERS IV SOLN: Freq: Once | INTRAVENOUS | Status: AC

## 2020-02-25 NOTE — Discharge Instructions (Signed)
Colonoscopy Discharge Instructions  Read the instructions outlined below and refer to this sheet in the next few weeks. These discharge instructions provide you with general information on caring for yourself after you leave the hospital. Your doctor may also give you specific instructions. While your treatment has been planned according to the most current medical practices available, unavoidable complications occasionally occur. If you have any problems or questions after discharge, call Dr. Gala Romney at [phone]. ACTIVITY  You may resume your regular activity, but move at a slower pace for the next 24 hours.   Take frequent rest periods for the next 24 hours.   Walking will help get rid of the air and reduce the bloated feeling in your belly (abdomen).   No driving for 24 hours (because of the medicine (anesthesia) used during the test).    Do not sign any important legal documents or operate any machinery for 24 hours (because of the anesthesia used during the test).  NUTRITION  Drink plenty of fluids.   You may resume your normal diet as instructed by your doctor.   Begin with a light meal and progress to your normal diet. Heavy or fried foods are harder to digest and may make you feel sick to your stomach (nauseated).   Avoid alcoholic beverages for 24 hours or as instructed.  MEDICATIONS  You may resume your normal medications unless your doctor tells you otherwise.  WHAT YOU CAN EXPECT TODAY  Some feelings of bloating in the abdomen.   Passage of more gas than usual.   Spotting of blood in your stool or on the toilet paper.  IF YOU HAD POLYPS REMOVED DURING THE COLONOSCOPY:  No aspirin products for 7 days or as instructed.   No alcohol for 7 days or as instructed.   Eat a soft diet for the next 24 hours.  FINDING OUT THE RESULTS OF YOUR TEST Not all test results are available during your visit. If your test results are not back during the visit, make an appointment  with your caregiver to find out the results. Do not assume everything is normal if you have not heard from your caregiver or the medical facility. It is important for you to follow up on all of your test results.  SEEK IMMEDIATE MEDICAL ATTENTION IF:  You have more than a spotting of blood in your stool.   Your belly is swollen (abdominal distention).   You are nauseated or vomiting.   You have a temperature over 101.   You have abdominal pain or discomfort that is severe or gets worse throughout the day.   2 small polyps removed from your colon today.  Minimal hemorrhoids left  You may or may not benefit from a couple of more bands placed in the office  You may pass a little blood today with your next bowel movement or 2 but it will go away  Office visit with [place] in 6 weeks for possible repeat hemorrhoid banding Roseanne Kaufman)  Further recommendations to follow pending review of pathology report  At patient request, I spoke to [place] [phone] regarding results      Monitored Anesthesia Care, Care After These instructions provide you with information about caring for yourself after your procedure. Your health care provider may also give you more specific instructions. Your treatment has been planned according to current medical practices, but problems sometimes occur. Call your health care provider if you have any problems or questions after your procedure. What can  I expect after the procedure? After your procedure, you may:  Feel sleepy for several hours.  Feel clumsy and have poor balance for several hours.  Feel forgetful about what happened after the procedure.  Have poor judgment for several hours.  Feel nauseous or vomit.  Have a sore throat if you had a breathing tube during the procedure. Follow these instructions at home: For at least 24 hours after the procedure:      Have a responsible adult stay with you. It is important to have someone help care  for you until you are awake and alert.  Rest as needed.  Do not: ? Participate in activities in which you could fall or become injured. ? Drive. ? Use heavy machinery. ? Drink alcohol. ? Take sleeping pills or medicines that cause drowsiness. ? Make important decisions or sign legal documents. ? Take care of children on your own. Eating and drinking  Follow the diet that is recommended by your health care provider.  If you vomit, drink water, juice, or soup when you can drink without vomiting.  Make sure you have little or no nausea before eating solid foods. General instructions  Take over-the-counter and prescription medicines only as told by your health care provider.  If you have sleep apnea, surgery and certain medicines can increase your risk for breathing problems. Follow instructions from your health care provider about wearing your sleep device: ? Anytime you are sleeping, including during daytime naps. ? While taking prescription pain medicines, sleeping medicines, or medicines that make you drowsy.  If you smoke, do not smoke without supervision.  Keep all follow-up visits as told by your health care provider. This is important. Contact a health care provider if:  You keep feeling nauseous or you keep vomiting.  You feel light-headed.  You develop a rash.  You have a fever. Get help right away if:  You have trouble breathing. Summary  For several hours after your procedure, you may feel sleepy and have poor judgment.  Have a responsible adult stay with you for at least 24 hours or until you are awake and alert. This information is not intended to replace advice given to you by your health care provider. Make sure you discuss any questions you have with your health care provider. Document Revised: 08/26/2017 Document Reviewed: 09/18/2015 Elsevier Patient Education  [place].

## 2020-02-25 NOTE — Op Note (Signed)
[facility] Patient Name: Casey Castillo Procedure Date: 02/25/2020 8:04 AM MRN: [id number] Date of Birth: Aug 16, 1945 Attending MD: Norvel Richards , MD CSN: [id number] Age: 74 Admit Type: Outpatient Procedure:                Colonoscopy Indications:              High risk colon cancer surveillance: Personal                            history of colonic polyps Providers:                Norvel Richards, MD, Tensed Page, Aram Candela, [place] Risa Grill, Technician Referring MD:              Medicines:                Propofol per Anesthesia Complications:            No immediate complications. Estimated Blood Loss:     Estimated blood loss was minimal. Procedure:                Pre-Anesthesia Assessment:                           - Prior to the procedure, a History and Physical                            was performed, and patient medications and                            allergies were reviewed. The patient's tolerance of                            previous anesthesia was also reviewed. The risks                            and benefits of the procedure and the sedation                            options and risks were discussed with the patient.                            All questions were answered, and informed consent                            was obtained. Prior Anticoagulants: The patient has                            taken no previous anticoagulant or antiplatelet                            agents. ASA Grade Assessment: II - A patient with  mild systemic disease. After reviewing the risks                            and benefits, the patient was deemed in                            satisfactory condition to undergo the procedure.                           After obtaining informed consent, the colonoscope                            was passed under direct vision. Throughout the                             procedure, the patient's blood pressure, pulse, and                            oxygen saturations were monitored continuously. The                            CF-HQ190L ([id number]) scope was introduced through                            the anus and advanced to the the cecum, identified                            by appendiceal orifice and ileocecal valve. The                            colonoscopy was performed without difficulty. The                            patient tolerated the procedure well. The quality                            of the bowel preparation was adequate. The                            ileocecal valve, appendiceal orifice, and rectum                            were photographed. Scope In: 8:42:49 AM Scope Out: 8:55:13 AM Scope Withdrawal Time: 0 hours 10 minutes 3 seconds  Total Procedure Duration: 0 hours 12 minutes 24 seconds  Findings:      The perianal and digital rectal examinations were normal.      Two sessile polyps were found in the rectum, distal rectum and cecum.       The polyps were 2 to 4 mm in size. These polyps were removed with a cold       snare. Resection and retrieval were complete. Estimated blood loss was       minimal.      Scattered medium-mouthed diverticula were found in the sigmoid colon and       descending  colon. Prior ascending colon polypectomy site in apparent       today indicating complete resection previously. Previously placed clips       were gone.      The exam was otherwise without abnormality on direct and retroflexion       views. Impression:               - Two 2 to 4 mm polyps in the rectum, in the distal                            rectum and in the cecum, removed with a cold snare.                            Resected and retrieved.                           - Diverticulosis in the sigmoid colon and in the                            descending colon.                           - The examination was otherwise normal on direct                             and retroflexion views. Minimal hemorrhoids remain.                            Patient states he still has a little rectal                            bleeding but better since he was banded previously. Moderate Sedation:      Moderate (conscious) sedation was personally administered by an       anesthesia professional. The following parameters were monitored: oxygen       saturation, heart rate, blood pressure, respiratory rate, EKG, adequacy       of pulmonary ventilation, and response to care. Recommendation:           - Patient has a contact number available for                            emergencies. The signs and symptoms of potential                            delayed complications were discussed with the                            patient. Return to normal activities tomorrow.                            Written discharge instructions were provided to the                            patient.                           -  Resume previous diet.                           - Continue present medications.                           - Repeat colonoscopy date to be determined after                            pending pathology results are reviewed for                            surveillance.                           - Return to GI office in 6 weeks. He may or may not                            benefit from touchup banding or 2 in 6 weeks. We                            will follow him clinically. Procedure Code(s):        --- Professional ---                           [phone], Colonoscopy, flexible; with removal of                            tumor(s), polyp(s), or other lesion(s) by snare                            technique Diagnosis Code(s):        --- Professional ---                           Z86.010, Personal history of colonic polyps                           K62.1, Rectal polyp                           K63.5, Polyp of colon                           K57.30,  Diverticulosis of large intestine without                            perforation or abscess without bleeding CPT copyright 2019 American Medical Association. All rights reserved. The codes documented in this report are preliminary and upon coder review may  be revised to meet current compliance requirements. Casey Castillo. Charese Abundis, MD Norvel Richards, MD 02/25/2020 9:20:26 AM This report has been signed electronically. Number of Addenda: 0

## 2020-02-25 NOTE — Anesthesia Postprocedure Evaluation (Signed)
Anesthesia Post Note  Patient: Casey Castillo  Procedure(s) Performed: COLONOSCOPY WITH PROPOFOL (N/A ) POLYPECTOMY  Patient location during evaluation: PACU Anesthesia Type: General Level of consciousness: awake Pain management: pain level controlled Vital Signs Assessment: post-procedure vital signs reviewed and stable Respiratory status: spontaneous breathing Cardiovascular status: stable Postop Assessment: no apparent nausea or vomiting Anesthetic complications: no   No complications documented.   Last Vitals:  Vitals:   02/25/20 0900 02/25/20 0906  BP: 105/66 110/77  Pulse: 67 71  Resp: (!) 22 20  Temp: 36.6 C   SpO2: 95% 95%    Last Pain:  Vitals:   02/25/20 0915  TempSrc:   PainSc: [place]

## 2020-02-25 NOTE — Transfer of Care (Signed)
Immediate Anesthesia Transfer of Care Note  Patient: Casey Castillo  Procedure(s) Performed: COLONOSCOPY WITH PROPOFOL (N/A ) POLYPECTOMY  Patient Location: PACU  Anesthesia Type:General  Level of Consciousness: awake  Airway & Oxygen Therapy: Patient Spontanous Breathing  Post-op Assessment: Report given to RN and Post -op Vital signs reviewed and stable  Post vital signs: Reviewed and stable  Last Vitals:  Vitals Value Taken Time  BP    Temp    Pulse    Resp    SpO2      Last Pain:  Vitals:   02/25/20 0837  TempSrc:   PainSc: 3       Patients Stated Pain Goal: 5 (67/20/91 9802)  Complications: No complications documented.

## 2020-02-25 NOTE — Progress Notes (Signed)
EKG lead connection sites cleansed with warm soapy water, then alcohol wipe to remove residual adhesive at patient request due to history of "my skin gets irritated from the adhesive".  Sites clear and without blistering or erythema.

## 2020-02-25 NOTE — Anesthesia Preprocedure Evaluation (Addendum)
Anesthesia Evaluation  Patient identified by MRN, date of birth, ID band Patient awake    Reviewed: Allergy & Precautions, NPO status , Patient's Chart, lab work & pertinent test results  History of Anesthesia Complications Negative for: history of anesthetic complications  Airway Mallampati: II  TM Distance: >3 FB Neck ROM: Limited   Comment: ACDF Dental  (+) Teeth Intact, Dental Advisory Given ROOT CANAL,CROWN :   Pulmonary pneumonia, resolved, former smoker,    Pulmonary exam normal breath sounds clear to auscultation       Cardiovascular Exercise Tolerance: Good + CAD  Normal cardiovascular exam Rhythm:Regular Rate:Normal     Neuro/Psych PSYCHIATRIC DISORDERS Anxiety  Neuromuscular disease    GI/Hepatic GERD  Medicated and Controlled,  Endo/Other  negative endocrine ROS  Renal/GU negative Renal ROS     Musculoskeletal  (+) Arthritis  (ACDF), Osteoarthritis,    Abdominal   Peds  Hematology negative hematology ROS (+)   Anesthesia Other Findings   Reproductive/Obstetrics                            Anesthesia Physical Anesthesia Plan  ASA: II  Anesthesia Plan: General   Post-op Pain Management:    Induction: Intravenous  PONV Risk Score and Plan: TIVA  Airway Management Planned: Nasal Cannula, Natural Airway and Simple Face Mask  Additional Equipment:   Intra-op Plan:   Post-operative Plan:   Informed Consent: I have reviewed the patients History and Physical, chart, labs and discussed the procedure including the risks, benefits and alternatives for the proposed anesthesia with the patient or authorized representative who has indicated his/her understanding and acceptance.     Dental advisory given  Plan Discussed with: CRNA and Surgeon  Anesthesia Plan Comments:        Anesthesia Quick Evaluation

## 2020-02-25 NOTE — H&P (Signed)
@LOGO @   Primary Care Physician:  Redmond School, MD Primary Gastroenterologist:  Dr. Gala Romney  Pre-Procedure History & Physical: HPI:  Casey Castillo is a 74 y.o. male here for early surveillance colonoscopy.  History of multiple colonic adenomas removed earlier in the year with piecemeal removal of large ascending colon polyp.  Overall rectal bleeding is improved with hemorrhoid banding still has an episode now and then.  Patient wonders about "touchup" banding.  Past Medical History:  Diagnosis Date  . Arthritis   . Cervical myelopathy with cervical radiculopathy   . Claustrophobia   . Coronary artery disease    5%blockage on left anterior descending Dr. Sallyanne Kuster   . Diverticulitis    hx of  . Enlarged prostate    takes flomax  . GERD (gastroesophageal reflux disease)   . History of blood clots    november, 5 1995- left leg; 2014 - left calf  . History of kidney stones   . Hyperlipidemia    primary Dr. Selena Batten  . Pneumonia    as a child  25 YEARS OLD  . PTSD (post-traumatic stress disorder)   . PTSD (post-traumatic stress disorder)   . Seasonal allergies   . Urinary frequency    hx of     Past Surgical History:  Procedure Laterality Date  . ANTERIOR CERVICAL DECOMP/DISCECTOMY FUSION  01/28/2012   Procedure: ANTERIOR CERVICAL DECOMPRESSION/DISCECTOMY FUSION 2 LEVELS;  Surgeon: Kristeen Miss, MD;  Location: [facility];  Service: Neurosurgery;  Laterality: N/A;  Cervical six-seven, cervical seven-thoracic one Anterior cervical decompression/diskectomy/fusion  . ANTERIOR CERVICAL DECOMP/DISCECTOMY FUSION N/A 12/24/2017   Procedure: ANTERIOR CERVICAL DECOMPRESSION/DISCECTOMY FUSION CERVICAL FOUR-FIVE/CERVICAL FIVE-SIX TWO LEVELS;  Surgeon: Kristeen Miss, MD;  Location: [facility];  Service: Neurosurgery;  Laterality: N/A;  ANTERIOR CERVICAL DECOMPRESSION/DISCECTOMY FUSION CERVICAL FOUR-FIVE/CERVICAL FIVE-SIX TWO LEVELS  . CARDIAC CATHETERIZATION     2006  . CARDIOVASCULAR STRESS  TEST     2006 and 2008 at [facility], 2011 myoview and echo  . COLONOSCOPY    . COLONOSCOPY N/A 08/30/2015   Dr. Arnoldo Morale: one 20 mm polyp in proximal ascending colon, removed piecemeal using hot snare. Resected and retrieved. Diverticulosis in sigmoid colon. Path with tubular adenoma, no high grade dysplasia. 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 COLONOSCOPY N/A 08/26/2019   sigmoid and descending colon diverticulosis, seven 3-7 mm polyps in cecum, (tubular adenomas and sessile serrated) four 4-6 mm polyps in ascending colon (tubular adenomas), two 4-6 mm polyps in sigmoid and splenic flexure (tubular adenomas), one 25 mm polyp in ascending colon (tubular adenoma) removed via piecemeal fashion  . engrown  toenail Bilateral   . EYE SURGERY     Laser for torn retina  . HEMORRHOID SURGERY     sept 8, 2008  . KIDNEY STONE SURGERY     lithotripsy 1980's  . KNEE SURGERY     left 04-11-94 ( has had two surgeries to left knee)  . NASAL SINUS SURGERY     Jun 30 1998  . NM MYOVIEW LTD  07/20/2009   no ischemia  . PILONIDAL CYST / SINUS EXCISION     surgery [birth year]  . POLYPECTOMY  08/26/2019   Procedure: POLYPECTOMY;  Surgeon: Daneil Dolin, MD;  Location: [facility];  Service: Endoscopy;;  . RADIOLOGY WITH ANESTHESIA N/A 06/12/2018   Procedure: MRI OF CHEST WITH AND WITHOUT CONTRAST;  Surgeon: Radiologist, Medication, MD;  Location: [facility];  Service: Radiology;  Laterality: N/A;  . SHOULDER SURGERY     right 12-20-2003  . TOTAL KNEE ARTHROPLASTY Left 09/15/2019   Procedure: TOTAL KNEE ARTHROPLASTY;  Surgeon: Renette Butters, MD;  Location: [facility];  Service: Orthopedics;  Laterality: Left;  . TRIGGER FINGER RELEASE     03-21-98 right hand ring finger  . US ECHOCARDIOGRAPHY  07/20/2009   LA & RA mildly dilated,trace MR,TR.    Prior to Admission medications   Medication Sig Start Date End Date Taking? Authorizing Provider  acetaminophen (TYLENOL)  650 MG CR tablet Take 650-1,300 mg by mouth every 8 (eight) hours as needed for pain.    Yes [provider]  amoxicillin (AMOXIL) 500 MG capsule Take 2,000 mg by mouth See admin instructions. Take 4 capsules (2000 mg) by mouth 1 hour prior to dental appointment 02/12/20  Yes [provider]  aspirin EC 81 MG tablet Take 81 mg by mouth daily. Swallow whole.   Yes [provider]  Biotin 5000 MCG TABS Take 5,000 mcg by mouth daily.    Yes [provider]  Cholecalciferol (VITAMIN D) 2000 UNITS CAPS Take 2,000 Units by mouth at bedtime.    Yes [provider]  clobetasol cream (TEMOVATE) 2.83 % Apply 1 application topically 2 (two) times daily as needed (itching/dry skin.).    Yes [provider]  diphenhydrAMINE (BENADRYL) 50 MG tablet Take 50 mg by mouth at bedtime as needed for sleep (sleep.).    Yes [provider]  loratadine (CLARITIN) 10 MG tablet Take 10 mg by mouth daily.    Yes [provider]  Lysine HCl 500 MG TABS Take 500 mg by mouth at bedtime.    Yes [provider]  Melatonin 5 MG TABS Take 5 mg by mouth at bedtime as needed (sleep).   Yes [provider]  Omega-3 Fatty Acids (FISH OIL) 1200 MG CAPS Take 1,200 mg by mouth at bedtime.   Yes [provider]  oxybutynin (DITROPAN) 5 MG tablet Take 1 tablet (5 mg total) by mouth daily. Patient taking differently: Take 2.5 mg by mouth daily.  08/13/19  Yes Dahlstedt, Annie Main, MD  pantoprazole (PROTONIX) 40 MG tablet Take 40 mg by mouth 2 (two) times daily.  03/24/14  Yes [provider]  simvastatin (ZOCOR) 40 MG tablet Take 1 tablet (40 mg total) by mouth daily. Patient taking differently: Take 40 mg by mouth every evening.  02/11/20  Yes Croitoru, Mihai, MD  tadalafil (CIALIS) 5 MG tablet Take 5 mg by mouth every evening.    Yes [provider]  tamsulosin (FLOMAX) 0.4 MG CAPS capsule Take 1 capsule (0.4 mg total) by mouth 2  (two) times daily. 10/16/19  Yes Dahlstedt, Annie Main, MD  calcium elemental as carbonate (TUMS ULTRA 1000) 400 MG chewable tablet Chew 2,000-3,000 mg by mouth daily as needed for heartburn.    [provider]  Emollient (AQUAPHOR EX) Apply 1 application topically 3 (three) times daily as needed (severe dry skin.).     [provider]  valACYclovir (VALTREX) 1000 MG tablet Take 500 mg by mouth daily as needed (fever blisters.).     [provider]    Allergies as of 01/14/2020 - Review Complete 12/24/2019  Allergen Reaction Noted  . Adhesive [tape] Hives, Rash, and Other (See Comments) 05/26/2013    Family History  Problem Relation Age of Onset  . Heart attack Father   . Dementia Mother   . Heart disease Other   .  Colon cancer Neg Hx     Social History   Socioeconomic History  . Marital status: Widowed    Spouse name: Not on file  . Number of children: Not on file  . Years of education: Not on file  . Highest education level: Not on file  Occupational History  . Not on file  Tobacco Use  . Smoking status: Former Smoker    Packs/day: 1.00    Years: 20.00    Pack years: 20.00    Types: Cigarettes    Quit date: 1972    Years since quitting: 49.7  . Smokeless tobacco: Never Used  . Tobacco comment: quit smokin cigarettes at age 89  Vaping Use  . Vaping Use: Never used  Substance and Sexual Activity  . Alcohol use: No  . Drug use: No  . Sexual activity: Not Currently  Other Topics Concern  . Not on file  Social History Narrative  . Not on file   Social Determinants of Health   Financial Resource Strain:   . Difficulty of Paying Living Expenses: Not on file  Food Insecurity:   . Worried About Charity fundraiser in the Last Year: Not on file  . Ran Out of Food in the Last Year: Not on file  Transportation Needs:   . Lack of Transportation (Medical): Not on file  . Lack of Transportation (Non-Medical): Not on file  Physical Activity:   .  Days of Exercise per Week: Not on file  . Minutes of Exercise per Session: Not on file  Stress:   . Feeling of Stress : Not on file  Social Connections:   . Frequency of Communication with Friends and Family: Not on file  . Frequency of Social Gatherings with Friends and Family: Not on file  . Attends Religious Services: Not on file  . Active Member of Clubs or Organizations: Not on file  . Attends Archivist Meetings: Not on file  . Marital Status: Not on file  Intimate Partner Violence:   . Fear of Current or Ex-Partner: Not on file  . Emotionally Abused: Not on file  . Physically Abused: Not on file  . Sexually Abused: Not on file    Review of Systems: See HPI, otherwise negative ROS  Physical Exam: BP 136/85   Pulse 70   Temp 97.7 F (36.5 C) (Oral)   Resp (!) 22   Ht 5\' 10"  (1.778 m)   SpO2 95%   BMI 32.51 kg/m  General:   Alert,  Well-developed, well-nourished, pleasant and cooperative in NAD Mouth:  No deformity or lesions. Neck:  Supple; no masses or thyromegaly. No significant cervical adenopathy. Lungs:  Clear throughout to auscultation.   No wheezes, crackles, or rhonchi. No acute distress. Heart:  Regular rate and rhythm; no murmurs, clicks, rubs,  or gallops. Abdomen: Non-distended, normal bowel sounds.  Soft and nontender without appreciable mass or hepatosplenomegaly.  Pulses:  Normal pulses noted. Extremities:  Without clubbing or edema.  Impression/Plan: 74 year old gentleman with a multiple colonic adenomas removed earlier this year.  Ascending colon polyp removed piecemeal.  Here for early surveillance colonoscopy per plan. The risks, benefits, limitations, alternatives and imponderables have been reviewed with the patient. Questions have been answered. All parties are agreeable.      Notice: This dictation was prepared with Dragon dictation along with smaller phrase technology. Any transcriptional errors that result from this process are  unintentional and may not be corrected upon review.

## 2020-02-26 ENCOUNTER — Encounter: Payer: Self-pay | Admitting: Internal Medicine

## 2020-02-26 LAB — SURGICAL PATHOLOGY

## 2020-02-29 ENCOUNTER — Telehealth: Payer: Self-pay | Admitting: General Practice

## 2020-02-29 NOTE — Telephone Encounter (Signed)
Patient called in stating he had a procedure done last week by Dr. Gala Romney and he has developed a rash where the EKG pads were placed.  He wanted to know if you can send something in for this.    He said this has happened before when he's had procedures and use EKG pads.  He stated Prednisone has helped in the past.  Please advise?  The patient can be reached at [phone].

## 2020-02-29 NOTE — Telephone Encounter (Signed)
Prednisone may be excessive for this type of problem.  We will try 1% hydrocortisone DM-over-the-counter.  If that does not work, we need to ask anesthesia what they would do since dealt with the pads.

## 2020-02-29 NOTE — Telephone Encounter (Signed)
Patient notified of Dr. Roseanne Kaufman recommendations.

## 2020-03-01 ENCOUNTER — Encounter (HOSPITAL_COMMUNITY): Payer: Self-pay | Admitting: Internal Medicine

## 2020-03-28 DIAGNOSIS — F4312 Post-traumatic stress disorder, chronic: Secondary | ICD-10-CM | POA: Diagnosis not present

## 2020-03-30 DIAGNOSIS — L308 Other specified dermatitis: Secondary | ICD-10-CM | POA: Diagnosis not present

## 2020-04-08 ENCOUNTER — Telehealth: Payer: Self-pay

## 2020-04-09 DIAGNOSIS — N400 Enlarged prostate without lower urinary tract symptoms: Secondary | ICD-10-CM | POA: Diagnosis not present

## 2020-04-09 DIAGNOSIS — E6609 Other obesity due to excess calories: Secondary | ICD-10-CM | POA: Diagnosis not present

## 2020-04-09 DIAGNOSIS — E782 Mixed hyperlipidemia: Secondary | ICD-10-CM | POA: Diagnosis not present

## 2020-04-09 DIAGNOSIS — I10 Essential (primary) hypertension: Secondary | ICD-10-CM | POA: Diagnosis not present

## 2020-04-10 NOTE — Telephone Encounter (Signed)
Got it.

## 2020-04-11 ENCOUNTER — Other Ambulatory Visit: Payer: Self-pay

## 2020-04-11 ENCOUNTER — Other Ambulatory Visit: Payer: Self-pay | Admitting: Urology

## 2020-04-11 ENCOUNTER — Ambulatory Visit (HOSPITAL_COMMUNITY)
Admission: RE | Admit: 2020-04-11 | Discharge: 2020-04-11 | Disposition: A | Discharge status: Home / Self Care | Payer: PPO | Source: Ambulatory Visit | Attending: Urology | Admitting: Urology

## 2020-04-11 DIAGNOSIS — R109 Unspecified abdominal pain: Secondary | ICD-10-CM

## 2020-04-11 NOTE — Telephone Encounter (Signed)
Pt called and made aware and voiced understanding

## 2020-04-11 NOTE — Telephone Encounter (Signed)
-----   Message from Franchot Gallo, MD sent at 04/11/2020 10:17 AM EDT ----- Pt called [facility] office today b/c he could not get through there--wanted a KUB for possible stone--I put order in for today so he can get it

## 2020-04-12 ENCOUNTER — Ambulatory Visit: Payer: PPO | Admitting: Gastroenterology

## 2020-04-12 ENCOUNTER — Encounter: Payer: Self-pay | Admitting: Urology

## 2020-04-12 ENCOUNTER — Ambulatory Visit (INDEPENDENT_AMBULATORY_CARE_PROVIDER_SITE_OTHER): Payer: PPO | Admitting: Urology

## 2020-04-12 ENCOUNTER — Encounter: Payer: Self-pay | Admitting: Gastroenterology

## 2020-04-12 VITALS — BP 123/75 | HR 64 | Temp 97.3°F | Ht 70.0 in | Wt 227.2 lb

## 2020-04-12 VITALS — BP 132/70 | HR 62 | Temp 98.2°F | Ht 70.0 in | Wt 226.6 lb

## 2020-04-12 DIAGNOSIS — N401 Enlarged prostate with lower urinary tract symptoms: Secondary | ICD-10-CM | POA: Diagnosis not present

## 2020-04-12 DIAGNOSIS — N3281 Overactive bladder: Secondary | ICD-10-CM | POA: Diagnosis not present

## 2020-04-12 DIAGNOSIS — R109 Unspecified abdominal pain: Secondary | ICD-10-CM | POA: Diagnosis not present

## 2020-04-12 DIAGNOSIS — K649 Unspecified hemorrhoids: Secondary | ICD-10-CM | POA: Diagnosis not present

## 2020-04-12 DIAGNOSIS — N138 Other obstructive and reflux uropathy: Secondary | ICD-10-CM | POA: Diagnosis not present

## 2020-04-12 LAB — URINALYSIS, ROUTINE W REFLEX MICROSCOPIC
Bilirubin, UA: NEGATIVE
Glucose, UA: NEGATIVE
Ketones, UA: NEGATIVE
Leukocytes,UA: NEGATIVE
Nitrite, UA: NEGATIVE
Protein,UA: NEGATIVE
Specific Gravity, UA: 1.02 (ref 1.005–1.030)
Urobilinogen, Ur: 0.2 mg/dL (ref 0.2–1.0)
pH, UA: 7 (ref 5.0–7.5)

## 2020-04-12 LAB — MICROSCOPIC EXAMINATION
Bacteria, UA: NONE SEEN
Epithelial Cells (non renal): NONE SEEN /hpf (ref 0–10)
Renal Epithel, UA: NONE SEEN /hpf
WBC, UA: NONE SEEN /hpf (ref 0–5)

## 2020-04-12 MED ORDER — HYDROCORTISONE (PERIANAL) 1 % EX CREA: 1.0000 "application " | TOPICAL_CREAM | Freq: Two times a day (BID) | CUTANEOUS | 1 refills | Status: DC

## 2020-04-12 NOTE — Progress Notes (Signed)
Urological Symptom Review  Patient is experiencing the following symptoms: Frequent urination  Kidney stones   Review of Systems  Gastrointestinal (upper)  : Negative for upper GI symptoms  Gastrointestinal (lower) : Negative for lower GI symptoms  Constitutional : Negative for symptoms  Skin: Negative for skin symptoms  Eyes: Negative for eye symptoms  Ear/Nose/Throat : Negative for Ear/Nose/Throat symptoms  Hematologic/Lymphatic: Negative for Hematologic/Lymphatic symptoms  Cardiovascular : Negative for cardiovascular symptoms  Respiratory : Negative for respiratory symptoms  Endocrine: Negative for endocrine symptoms  Musculoskeletal: Back pain  Neurological: Negative for neurological symptoms  Psychologic: Negative for psychiatric symptoms

## 2020-04-12 NOTE — Patient Instructions (Signed)
I have sent in a cream to use twice per day as needed for external hemorrhoid. If you feel like you need another banding, let me know.  Otherwise, we will see you  Back as needed! Your next colonoscopy will be in 2024!   I enjoyed seeing you again today! As you know, I value our relationship and want to provide genuine, compassionate, and quality care. I welcome your feedback. If you receive a survey regarding your visit,  I greatly appreciate you taking time to fill this out. See you next time!  Annitta Needs, PhD, ANP-BC [facility]

## 2020-04-12 NOTE — Progress Notes (Signed)
Referring Provider: Redmond School, MD Primary Care Physician:  Redmond School, MD Primary GI: Dr. Gala Romney   Chief Complaint  Patient presents with  . Hemorrhoids    external hemorrhoid swollen; burning, itching, small amount of bleeding    HPI:   Casey Castillo is a 74 y.o. male presenting today with a history of  symptomatic hemorrhoids s/p banding earlier this year, colonoscopy March 2021 with numerous polyps and one large 25 mm polyp removed via piecemeal fashion, and early interval colonoscopy Sept 2021: two 2-4 mm polyps in rectum and cecum, diverticulosis in sigmoid and descending colon. Minimal hemorrhoids remianing. Tubular adenomas. Surveillance in 3 years.   Previously banded all 3 columns and neutrally. Returns today stating he has an external hemorrhoid that flares intermittently but no issues now. Overall much improved s/p banding. Has burning/itching when external one flares.   Past Medical History:  Diagnosis Date  . Arthritis   . Cervical myelopathy with cervical radiculopathy (HCC)   . Claustrophobia   . Coronary artery disease    5%blockage on left anterior descending Dr. Sallyanne Kuster   . Diverticulitis    hx of  . Enlarged prostate    takes flomax  . GERD (gastroesophageal reflux disease)   . History of blood clots    november, 5 1995- left leg; 2014 - left calf  . History of kidney stones   . Hyperlipidemia    primary Dr. Selena Batten  . Pneumonia    as a child  66 YEARS OLD  . PTSD (post-traumatic stress disorder)   . PTSD (post-traumatic stress disorder)   . Seasonal allergies   . Urinary frequency    hx of     Past Surgical History:  Procedure Laterality Date  . ANTERIOR CERVICAL DECOMP/DISCECTOMY FUSION  01/28/2012   Procedure: ANTERIOR CERVICAL DECOMPRESSION/DISCECTOMY FUSION 2 LEVELS;  Surgeon: Kristeen Miss, MD;  Location: [place] [facility];  Service: Neurosurgery;  Laterality: N/A;  Cervical six-seven, cervical seven-thoracic one Anterior  cervical decompression/diskectomy/fusion  . ANTERIOR CERVICAL DECOMP/DISCECTOMY FUSION N/A 12/24/2017   Procedure: ANTERIOR CERVICAL DECOMPRESSION/DISCECTOMY FUSION CERVICAL FOUR-FIVE/CERVICAL FIVE-SIX TWO LEVELS;  Surgeon: Kristeen Miss, MD;  Location: [place];  Service: Neurosurgery;  Laterality: N/A;  ANTERIOR CERVICAL DECOMPRESSION/DISCECTOMY FUSION CERVICAL FOUR-FIVE/CERVICAL FIVE-SIX TWO LEVELS  . CARDIAC CATHETERIZATION     2006  . CARDIOVASCULAR STRESS TEST     2006 and 2008 at [facility] heart and vascular, 2011 myoview and echo  . COLONOSCOPY    . COLONOSCOPY N/A 08/30/2015   Dr. Arnoldo Morale: one 20 mm polyp in proximal ascending colon, removed piecemeal using hot snare. Resected and retrieved. Diverticulosis in sigmoid colon. Path with tubular adenoma, no high grade dysplasia. 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 COLONOSCOPY N/A 08/26/2019   sigmoid and descending colon diverticulosis, seven 3-7 mm polyps in cecum, (tubular adenomas and sessile serrated) four 4-6 mm polyps in ascending colon (tubular adenomas), two 4-6 mm polyps in sigmoid and splenic flexure (tubular adenomas), one 25 mm polyp in ascending colon (tubular adenoma) removed via piecemeal fashion  . COLONOSCOPY WITH PROPOFOL N/A 02/25/2020   Colonoscopy Sept 2021: two 2-4 mm polyps in rectum and cecum, diverticulosis in sigmoid and descending colon. Minimal hemorrhoids remain. Tubular adenomas. 3 years.   . engrown  toenail Bilateral   . EYE SURGERY     Laser for torn retina  . HEMORRHOID SURGERY     sept 8, 2008  . KIDNEY STONE SURGERY  lithotripsy 1980's  . KNEE SURGERY     left 04-11-94 ( has had two surgeries to left knee)  . NASAL SINUS SURGERY     Jun 30 1998  . NM MYOVIEW LTD  07/20/2009   no ischemia  . PILONIDAL CYST / SINUS EXCISION     surgery [birth year]  . POLYPECTOMY  08/26/2019   Procedure: POLYPECTOMY;  Surgeon: Daneil Dolin, MD;  [facility];  Service:  Endoscopy;;  . POLYPECTOMY  02/25/2020   Procedure: POLYPECTOMY;  Surgeon: Daneil Dolin, MD;  [facility];  Service: Endoscopy;;  . RADIOLOGY WITH ANESTHESIA N/A 06/12/2018   Procedure: MRI OF CHEST WITH AND WITHOUT CONTRAST;  Surgeon: Radiologist, Medication, MD;  Location: [place];  Service: Radiology;  Laterality: N/A;  . SHOULDER SURGERY     right 12-20-2003  . TOTAL KNEE ARTHROPLASTY Left 09/15/2019   Procedure: TOTAL KNEE ARTHROPLASTY;  Surgeon: Renette Butters, MD;  Location: [facility];  Service: Orthopedics;  Laterality: Left;  . TRIGGER FINGER RELEASE     03-21-98 right hand ring finger  . US ECHOCARDIOGRAPHY  07/20/2009   LA & RA mildly dilated,trace MR,TR.    Current Outpatient Medications  Medication Sig Dispense Refill  . acetaminophen (TYLENOL) 650 MG CR tablet Take 650-1,300 mg by mouth every 8 (eight) hours as needed for pain.     Marland Kitchen amoxicillin (AMOXIL) 500 MG capsule Take 2,000 mg by mouth See admin instructions. Take 4 capsules (2000 mg) by mouth 1 hour prior to dental appointment    . aspirin EC 81 MG tablet Take 81 mg by mouth daily. Swallow whole.    . Biotin 5000 MCG TABS Take 5,000 mcg by mouth daily.     . Cholecalciferol (VITAMIN D) 2000 UNITS CAPS Take 2,000 Units by mouth at bedtime.     . clobetasol cream (TEMOVATE) 9.93 % Apply 1 application topically 2 (two) times daily as needed (itching/dry skin.).     Marland Kitchen diphenhydrAMINE (BENADRYL) 50 MG tablet Take 50 mg by mouth at bedtime as needed for sleep (sleep.).     Marland Kitchen loratadine (CLARITIN) 10 MG tablet Take 10 mg by mouth daily.     Marland Kitchen Lysine HCl 500 MG TABS Take 500 mg by mouth at bedtime.     . Melatonin 5 MG TABS Take 5 mg by mouth at bedtime as needed (sleep).    . Omega-3 Fatty Acids (FISH OIL) 1200 MG CAPS Take 1,200 mg by mouth at bedtime.    Marland Kitchen oxybutynin (DITROPAN) 5 MG tablet Take 1 tablet (5 mg total) by mouth daily. (Patient taking differently: Take 2.5 mg by mouth daily. ) 30 tablet 11  .  pantoprazole (PROTONIX) 40 MG tablet Take 40 mg by mouth daily.   2  . simvastatin (ZOCOR) 40 MG tablet Take 1 tablet (40 mg total) by mouth daily. (Patient taking differently: Take 40 mg by mouth every evening. ) 90 tablet 3  . tadalafil (CIALIS) 5 MG tablet Take 5 mg by mouth every evening.     . tamsulosin (FLOMAX) 0.4 MG CAPS capsule Take 1 capsule (0.4 mg total) by mouth 2 (two) times daily. 180 capsule 3  . valACYclovir (VALTREX) 1000 MG tablet Take 500 mg by mouth daily as needed (fever blisters.).     Marland Kitchen calcium elemental as carbonate (TUMS ULTRA 1000) 400 MG chewable tablet Chew 2,000-3,000 mg by mouth daily as needed for heartburn. (Patient not taking: Reported on 04/12/2020)    .  Emollient (AQUAPHOR EX) Apply 1 application topically 3 (three) times daily as needed (severe dry skin.).  (Patient not taking: Reported on 04/12/2020)    . Hydrocortisone, Perianal, (PROCTO-PAK) 1 % CREA Apply 1 application topically in the morning and at bedtime. As needed to external hemorrhoid 28 g 1   No current facility-administered medications for this visit.    Allergies as of 04/12/2020 - Review Complete 04/12/2020  Allergen Reaction Noted  . Adhesive [tape] Hives, Rash, and Other (See Comments) 05/26/2013    Family History  Problem Relation Age of Onset  . Heart attack Father   . Dementia Mother   . Heart disease Other   . Colon cancer Neg Hx     Social History   Socioeconomic History  . Marital status: Widowed    Spouse name: Not on file  . Number of children: Not on file  . Years of education: Not on file  . Highest education level: Not on file  Occupational History  . Not on file  Tobacco Use  . Smoking status: Former Smoker    Packs/day: 1.00    Years: 20.00    Pack years: 20.00    Types: Cigarettes    Quit date: 1972    Years since quitting: 49.8  . Smokeless tobacco: Never Used  . Tobacco comment: quit smokin cigarettes at age 74  Vaping Use  . Vaping Use: Never used    Substance and Sexual Activity  . Alcohol use: No  . Drug use: No  . Sexual activity: Not Currently  Other Topics Concern  . Not on file  Social History Narrative  . Not on file   Social Determinants of Health   Financial Resource Strain:   . Difficulty of Paying Living Expenses: Not on file  Food Insecurity:   . Worried About Charity fundraiser in the Last Year: Not on file  . Ran Out of Food in the Last Year: Not on file  Transportation Needs:   . Lack of Transportation (Medical): Not on file  . Lack of Transportation (Non-Medical): Not on file  Physical Activity:   . Days of Exercise per Week: Not on file  . Minutes of Exercise per Session: Not on file  Stress:   . Feeling of Stress : Not on file  Social Connections:   . Frequency of Communication with Friends and Family: Not on file  . Frequency of Social Gatherings with Friends and Family: Not on file  . Attends Religious Services: Not on file  . Active Member of Clubs or Organizations: Not on file  . Attends Archivist Meetings: Not on file  . Marital Status: Not on file    Review of Systems: Gen: Denies fever, chills, anorexia. Denies fatigue, weakness, weight loss.  CV: Denies chest pain, palpitations, syncope, peripheral edema, and claudication. Resp: Denies dyspnea at rest, cough, wheezing, coughing up blood, and pleurisy. GI: see HPI Derm: Denies rash, itching, dry skin Psych: Denies depression, anxiety, memory loss, confusion. No homicidal or suicidal ideation.  Heme: Denies bruising, bleeding, and enlarged lymph nodes.  Physical Exam: BP 123/75   Pulse 64   Temp (!) 97.3 F (36.3 C) (Temporal)   Ht 5\' 10"  (1.778 m)   Wt 227 lb 3.2 oz (103.1 kg)   BMI 32.60 kg/m  General:   Alert and oriented. No distress noted. Pleasant and cooperative.  Head:  Normocephalic and atraumatic. Eyes:  Conjuctiva clear without scleral icterus. Mouth:  Mask in place  Abdomen:  +BS, soft, non-tender and  non-distended. No rebound or guarding. No HSM or masses noted. Rectal: small non-thrombosed external hemorrhoid, no fissure Msk:  Symmetrical without gross deformities. Normal posture. Extremities:  Without edema. Neurologic:  Alert and  oriented x4 Psych:  Alert and cooperative. Normal mood and affect.  ASSESSMENT/Plan: Casey Castillo is a 73 y.o. male presenting today with history of multiple polyps and next surveillance in 3 years, banding of all 3 columns and neutral banding this year with overall improvement in symptoms. Still with external hemorrhoid that flares on occasion; I did discuss with him that I can't band this. However, if he has persistent low-volume hematochezia, itching, we can attempt a neutral banding again.   Anusol cream BID sent to pharmacy if needed. He is to call if he would like additional banding. Otherwise, return as needed. Colonoscopy in 2024.   Annitta Needs, PhD, ANP-BC [facility]

## 2020-04-12 NOTE — Progress Notes (Signed)
H&P  Chief Complaint: BPH  History of Present Illness:   11.2.2021: Pt was recently seen for the sensation of pressure in his back (L) and sporadic urinary symptoms, but a KUB did not reveal any stone burden at that time. His symptoms have since resolved. He notes occasional feeling of urinary urgency that is reminiscent of his past stone-related symptoms.  PSA: Most recent PSA - 1.47  OAB: Pt continues on oxybutynin for urinary urgency.   BPH: Pt continues on tamsulosin for LUTS  ED: Pt continues on tadalafil  IPSS Questionnaire (AUA-7): Over the past month.   1)  How often have you had a sensation of not emptying your bladder completely after you finish urinating?  0 - Not at all  2)  How often have you had to urinate again less than two hours after you finished urinating? 5 - Almost always  3)  How often have you found you stopped and started again several times when you urinated?  0 - Not at all  4) How difficult have you found it to postpone urination?  5 - Almost always  5) How often have you had a weak urinary stream?  4 - More than half the time  6) How often have you had to push or strain to begin urination?  0 - Not at all  7) How many times did you most typically get up to urinate from the time you went to bed until the time you got up in the morning?  1 - 1 time  Total score:  0-7 mildly symptomatic   8-19 moderately symptomatic   20-35 severely symptomatic     (below copied from [facility] records):  12.8.2021:  BPH: Pt w/ a fairly long history of BPH-- initially seen by Dr. [place] Pink in [place] and put on tamsulosin. He was then followed by Dr. Exie Parody until early 2018.   PSA: Here today for follow-up. He apparently brought in some previous records of his blood work from the [place] -- his PSA was 1.2 this last October. He continues on tamsulosin and is quite satisfied with his current urinary pattern with no significantly bothersome urinary sx's. 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   ED:  Elim Economou is a 74 year-old male established patient who is here for erectile dysfunction.  12.8.2020: Normally on 5 mg cilias -- he has been having issues having this filled due to COVID related disruptions to supply chains. He takes this daily to assist with urinary sx's and for sexual activity (he is still quite active).   Past Medical History:  Diagnosis Date  . Arthritis   . Cervical myelopathy with cervical radiculopathy   . Claustrophobia   . Coronary artery disease    5%blockage on left anterior descending Dr. Sallyanne Kuster   . Diverticulitis    hx of  . Enlarged prostate    takes flomax  . GERD (gastroesophageal reflux disease)   . History of blood clots    november, 5 1995- left leg; 2014 - left calf  . History of kidney stones   . Hyperlipidemia    primary Dr. Selena Batten  . Pneumonia    as a child  74 YEARS OLD  . PTSD (post-traumatic stress disorder)   . PTSD (post-traumatic stress disorder)   . Seasonal allergies   . Urinary frequency    hx of  Past Surgical History:  Procedure Laterality Date  . ANTERIOR CERVICAL DECOMP/DISCECTOMY FUSION  01/28/2012   Procedure: ANTERIOR CERVICAL DECOMPRESSION/DISCECTOMY FUSION 2 LEVELS;  Surgeon: Kristeen Miss, MD;  Location: [place] [facility];  Service: Neurosurgery;  Laterality: N/A;  Cervical six-seven, cervical seven-thoracic one Anterior cervical decompression/diskectomy/fusion  . ANTERIOR CERVICAL DECOMP/DISCECTOMY FUSION N/A 12/24/2017   Procedure: ANTERIOR CERVICAL DECOMPRESSION/DISCECTOMY FUSION CERVICAL FOUR-FIVE/CERVICAL FIVE-SIX TWO LEVELS;  Surgeon: Kristeen Miss, MD;  Location: [place];  Service: Neurosurgery;  Laterality: N/A;  ANTERIOR CERVICAL DECOMPRESSION/DISCECTOMY FUSION CERVICAL FOUR-FIVE/CERVICAL FIVE-SIX TWO LEVELS  . CARDIAC CATHETERIZATION     2006  . CARDIOVASCULAR STRESS TEST     2006 and 2008  at [facility] heart and vascular, 2011 myoview and echo  . COLONOSCOPY    . COLONOSCOPY N/A 08/30/2015   Dr. Arnoldo Morale: one 20 mm polyp in proximal ascending colon, removed piecemeal using hot snare. Resected and retrieved. Diverticulosis in sigmoid colon. Path with tubular adenoma, no high grade dysplasia. 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 COLONOSCOPY N/A 08/26/2019   sigmoid and descending colon diverticulosis, seven 3-7 mm polyps in cecum, (tubular adenomas and sessile serrated) four 4-6 mm polyps in ascending colon (tubular adenomas), two 4-6 mm polyps in sigmoid and splenic flexure (tubular adenomas), one 25 mm polyp in ascending colon (tubular adenoma) removed via piecemeal fashion  . COLONOSCOPY WITH PROPOFOL N/A 02/25/2020   Procedure: COLONOSCOPY WITH PROPOFOL;  Surgeon: Daneil Dolin, MD;  [facility];  Service: Endoscopy;  Laterality: N/A;  10:30am  . engrown  toenail Bilateral   . EYE SURGERY     Laser for torn retina  . HEMORRHOID SURGERY     sept 8, 2008  . KIDNEY STONE SURGERY     lithotripsy 1980's  . KNEE SURGERY     left 04-11-94 ( has had two surgeries to left knee)  . NASAL SINUS SURGERY     Jun 30 1998  . NM MYOVIEW LTD  07/20/2009   no ischemia  . PILONIDAL CYST / SINUS EXCISION     surgery [birth year]  . POLYPECTOMY  08/26/2019   Procedure: POLYPECTOMY;  Surgeon: Daneil Dolin, MD;  [facility];  Service: Endoscopy;;  . POLYPECTOMY  02/25/2020   Procedure: POLYPECTOMY;  Surgeon: Daneil Dolin, MD;  [facility];  Service: Endoscopy;;  . RADIOLOGY WITH ANESTHESIA N/A 06/12/2018   Procedure: MRI OF CHEST WITH AND WITHOUT CONTRAST;  Surgeon: Radiologist, Medication, MD;  Location: [place];  Service: Radiology;  Laterality: N/A;  . SHOULDER SURGERY     right 12-20-2003  . TOTAL KNEE ARTHROPLASTY Left 09/15/2019   Procedure: TOTAL KNEE ARTHROPLASTY;  Surgeon: Renette Butters, MD;  [facility];  Service:  Orthopedics;  Laterality: Left;  . TRIGGER FINGER RELEASE     03-21-98 right hand ring finger  . US ECHOCARDIOGRAPHY  07/20/2009   LA & RA mildly dilated,trace MR,TR.    Home Medications:  Allergies as of 04/12/2020      Reactions   Adhesive [tape] Hives, Rash, Other (See Comments)   EKG electrodes cause whelps.  Severe PLEASE WIPE OFF WITH ALCOHOL SWAB.      Medication List       Accurate as of April 12, 2020 12:42 PM. If you have any questions, ask your nurse or doctor.        acetaminophen 650 MG CR tablet Commonly known as: TYLENOL Take 650-1,300 mg by mouth every 8 (eight) hours  as needed for pain.   amoxicillin 500 MG capsule Commonly known as: AMOXIL Take 2,000 mg by mouth See admin instructions. Take 4 capsules (2000 mg) by mouth 1 hour prior to dental appointment   AQUAPHOR EX Apply 1 application topically 3 (three) times daily as needed (severe dry skin.).   aspirin EC 81 MG tablet Take 81 mg by mouth daily. Swallow whole.   Biotin 5000 MCG Tabs Take 5,000 mcg by mouth daily.   clobetasol cream 0.05 % Commonly known as: TEMOVATE Apply 1 application topically 2 (two) times daily as needed (itching/dry skin.).   diphenhydrAMINE 50 MG tablet Commonly known as: BENADRYL Take 50 mg by mouth at bedtime as needed for sleep (sleep.).   Fish Oil 1200 MG Caps Take 1,200 mg by mouth at bedtime.   loratadine 10 MG tablet Commonly known as: CLARITIN Take 10 mg by mouth daily.   Lysine HCl 500 MG Tabs Take 500 mg by mouth at bedtime.   melatonin 5 MG Tabs Take 5 mg by mouth at bedtime as needed (sleep).   oxybutynin 5 MG tablet Commonly known as: DITROPAN Take 1 tablet (5 mg total) by mouth daily. What changed: how much to take   pantoprazole 40 MG tablet Commonly known as: PROTONIX Take 40 mg by mouth 2 (two) times daily.   simvastatin 40 MG tablet Commonly known as: ZOCOR Take 1 tablet (40 mg total) by mouth daily. What changed: when to take this     tadalafil 5 MG tablet Commonly known as: CIALIS Take 5 mg by mouth every evening.   tamsulosin 0.4 MG Caps capsule Commonly known as: FLOMAX Take 1 capsule (0.4 mg total) by mouth 2 (two) times daily.   Tums Ultra 1000 400 MG chewable tablet Generic drug: calcium elemental as carbonate Chew 2,000-3,000 mg by mouth daily as needed for heartburn.   valACYclovir 1000 MG tablet Commonly known as: VALTREX Take 500 mg by mouth daily as needed (fever blisters.).   Vitamin D 50 MCG (2000 UT) Caps Take 2,000 Units by mouth at bedtime.       Allergies:  Allergies  Allergen Reactions  . Adhesive [Tape] Hives, Rash and Other (See Comments)    EKG electrodes cause whelps.  Severe PLEASE WIPE OFF WITH ALCOHOL SWAB.    Family History  Problem Relation Age of Onset  . Heart attack Father   . Dementia Mother   . Heart disease Other   . Colon cancer Neg Hx     Social History:  reports that he quit smoking about 49 years ago. His smoking use included cigarettes. He has a 20.00 pack-year smoking history. He has never used smokeless tobacco. He reports that he does not drink alcohol and does not use drugs.  ROS: A complete review of systems was performed.  All systems are negative except for pertinent findings as noted.  Physical Exam:  Vital signs in last 24 hours: There were no vitals taken for this visit. Constitutional:  Alert and oriented, No acute distress Cardiovascular: Regular rate  Respiratory: Normal respiratory effort GI: Abdomen is soft, nontender, nondistended, no abdominal masses. No CVAT.  Genitourinary: Normal male phallus, testes are descended bilaterally and non-tender and without masses, scrotum is normal in appearance without lesions or masses, perineum is normal on inspection. Prostate feels about 70 grams. Neurologic: Grossly intact, no focal deficits Psychiatric: Normal mood and affect  I have reviewed prior pt notes  I have reviewed notes from  referring/previous physicians  I  have reviewed urinalysis results  I have independently reviewed prior imaging--KUB below  I have reviewed prior PSA results    Radiologic Imaging: DG Abd 1 View  Result Date: 04/12/2020 CLINICAL DATA:  Evaluate for kidney stones.  Left flank pain. EXAM: ABDOMEN - 1 VIEW COMPARISON:  None. FINDINGS: The bowel gas pattern is normal. No radio-opaque calculi or other significant radiographic abnormality are seen. IMPRESSION: Negative. Electronically Signed   By: Kerby Moors M.D.   On: 04/12/2020 08:01    Impression/Assessment:  ED: Pt symptoms are well managed on tadalafil  BPH: Pt DRE shows prostate is enlarged to 70 grams, but otherwise unremarkable. His symptoms are well managed on tamsulosin and he requires no additional intervention at this time.  OAB: Pt symptoms are adequately managed on oxybutynin.  Kidney Stones: KUB reviewed and no renal/ureteral stones visible at this time.  Plan:  1. Pt continued on oxybutynin, tamsulosin, and tadalafil.  2. F/U in 1 year for OV and symptom recheck.  CC: Dr. Redmond School

## 2020-04-13 ENCOUNTER — Encounter: Payer: Self-pay | Admitting: Gastroenterology

## 2020-04-14 ENCOUNTER — Encounter: Payer: Self-pay | Admitting: Gastroenterology

## 2020-05-10 DIAGNOSIS — N400 Enlarged prostate without lower urinary tract symptoms: Secondary | ICD-10-CM | POA: Diagnosis not present

## 2020-05-10 DIAGNOSIS — E6609 Other obesity due to excess calories: Secondary | ICD-10-CM | POA: Diagnosis not present

## 2020-05-10 DIAGNOSIS — E7849 Other hyperlipidemia: Secondary | ICD-10-CM | POA: Diagnosis not present

## 2020-05-10 DIAGNOSIS — I10 Essential (primary) hypertension: Secondary | ICD-10-CM | POA: Diagnosis not present

## 2020-05-17 ENCOUNTER — Ambulatory Visit: Payer: PPO | Admitting: Urology

## 2020-05-17 ENCOUNTER — Other Ambulatory Visit: Payer: Self-pay

## 2020-05-17 DIAGNOSIS — N5201 Erectile dysfunction due to arterial insufficiency: Secondary | ICD-10-CM

## 2020-05-17 MED ORDER — TADALAFIL 5 MG PO TABS
5.0000 mg | ORAL_TABLET | Freq: Every evening | ORAL | 3 refills | Status: DC
Start: 2020-05-17 — End: 2020-05-17

## 2020-05-17 MED ORDER — TADALAFIL 5 MG PO TABS: 5.0000 mg | ORAL_TABLET | Freq: Every evening | ORAL | 3 refills | Status: DC

## 2020-05-18 DIAGNOSIS — F4312 Post-traumatic stress disorder, chronic: Secondary | ICD-10-CM | POA: Diagnosis not present

## 2020-06-07 ENCOUNTER — Other Ambulatory Visit: Payer: Self-pay

## 2020-06-07 DIAGNOSIS — N3281 Overactive bladder: Secondary | ICD-10-CM

## 2020-06-07 MED ORDER — TAMSULOSIN HCL 0.4 MG PO CAPS: 0.4000 mg | ORAL_CAPSULE | Freq: Two times a day (BID) | ORAL | 3 refills | Status: DC

## 2020-06-17 ENCOUNTER — Ambulatory Visit: Payer: PPO | Admitting: Gastroenterology

## 2020-06-17 ENCOUNTER — Encounter: Payer: Self-pay | Admitting: Gastroenterology

## 2020-06-17 ENCOUNTER — Other Ambulatory Visit: Payer: Self-pay

## 2020-06-17 VITALS — BP 135/70 | HR 59 | Temp 97.1°F | Ht 70.0 in | Wt 231.6 lb

## 2020-06-17 DIAGNOSIS — K641 Second degree hemorrhoids: Secondary | ICD-10-CM | POA: Diagnosis not present

## 2020-06-17 NOTE — Progress Notes (Signed)
Referring Provider: Redmond School, MD Primary Care Physician:  Redmond School, MD Primary GI: Dr. Gala Romney   Chief Complaint  Patient presents with  . Hemorrhoids    Had a flare right side about 1-2 weeks ago. Occas bleeding    HPI:   Casey Castillo is a 75 y.o. male presenting today with a history of symptomatic hemorrhoids s/p banding early last year, colonoscopy March 2021 with numerous polyps and one large 25 mm polyp removed via piecemeal fashion, and early interval colonoscopy Sept 2021: two 2-4 mm polyps in rectum and cecum, diverticulosis in sigmoid and descending colon. Minimal hemorrhoids remaining. Tubular adenomas. Surveillance in 3 years.   Has flares if frequent BMs after eating something that tears his stomach up. Mostly blood on tissue. Very rarely blood in water. Some itching/burning. Has trace of blood about once per week. Had bulging externally a few weeks ago. Feels like it was external. Uses cream with good improvement.    No symptoms currently today. Normally BM just once per day.    Past Medical History:  Diagnosis Date  . Arthritis   . Cervical myelopathy with cervical radiculopathy (HCC)   . Claustrophobia   . Coronary artery disease    5%blockage on left anterior descending Dr. Sallyanne Kuster   . Diverticulitis    hx of  . Enlarged prostate    takes flomax  . GERD (gastroesophageal reflux disease)   . History of blood clots    november, 5 1995- left leg; 2014 - left calf  . History of kidney stones   . Hyperlipidemia    primary Dr. Selena Batten  . Pneumonia    as a child  62 YEARS OLD  . PTSD (post-traumatic stress disorder)   . PTSD (post-traumatic stress disorder)   . Seasonal allergies   . Urinary frequency    hx of     Past Surgical History:  Procedure Laterality Date  . ANTERIOR CERVICAL DECOMP/DISCECTOMY FUSION  01/28/2012   Procedure: ANTERIOR CERVICAL DECOMPRESSION/DISCECTOMY FUSION 2 LEVELS;  Surgeon: Kristeen Miss, MD;  Location: [place]  [facility];  Service: Neurosurgery;  Laterality: N/A;  Cervical six-seven, cervical seven-thoracic one Anterior cervical decompression/diskectomy/fusion  . ANTERIOR CERVICAL DECOMP/DISCECTOMY FUSION N/A 12/24/2017   Procedure: ANTERIOR CERVICAL DECOMPRESSION/DISCECTOMY FUSION CERVICAL FOUR-FIVE/CERVICAL FIVE-SIX TWO LEVELS;  Surgeon: Kristeen Miss, MD;  Location: [place];  Service: Neurosurgery;  Laterality: N/A;  ANTERIOR CERVICAL DECOMPRESSION/DISCECTOMY FUSION CERVICAL FOUR-FIVE/CERVICAL FIVE-SIX TWO LEVELS  . CARDIAC CATHETERIZATION     2006  . CARDIOVASCULAR STRESS TEST     2006 and 2008 at [facility] heart and vascular, 2011 myoview and echo  . COLONOSCOPY    . COLONOSCOPY N/A 08/30/2015   Dr. Arnoldo Morale: one 20 mm polyp in proximal ascending colon, removed piecemeal using hot snare. Resected and retrieved. Diverticulosis in sigmoid colon. Path with tubular adenoma, no high grade dysplasia. 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 COLONOSCOPY N/A 08/26/2019   sigmoid and descending colon diverticulosis, seven 3-7 mm polyps in cecum, (tubular adenomas and sessile serrated) four 4-6 mm polyps in ascending colon (tubular adenomas), two 4-6 mm polyps in sigmoid and splenic flexure (tubular adenomas), one 25 mm polyp in ascending colon (tubular adenoma) removed via piecemeal fashion  . COLONOSCOPY WITH PROPOFOL N/A 02/25/2020   Colonoscopy Sept 2021: two 2-4 mm polyps in rectum and cecum, diverticulosis in sigmoid and descending colon. Minimal hemorrhoids remain. Tubular adenomas. 3 years.   . engrown  toenail Bilateral   .  EYE SURGERY     Laser for torn retina  . HEMORRHOID SURGERY     sept 8, 2008  . KIDNEY STONE SURGERY     lithotripsy 1980's  . KNEE SURGERY     left 04-11-94 ( has had two surgeries to left knee)  . NASAL SINUS SURGERY     Jun 30 1998  . NM MYOVIEW LTD  07/20/2009   no ischemia  . PILONIDAL CYST / SINUS EXCISION     surgery 01-08[birth year]74  . POLYPECTOMY   08/26/2019   Procedure: POLYPECTOMY;  Surgeon: Daneil Dolin, MD;  Location: [facility];  Service: Endoscopy;;  . POLYPECTOMY  02/25/2020   Procedure: POLYPECTOMY;  Surgeon: Daneil Dolin, MD;  Location: [facility];  Service: Endoscopy;;  . RADIOLOGY WITH ANESTHESIA N/A 06/12/2018   Procedure: MRI OF CHEST WITH AND WITHOUT CONTRAST;  Surgeon: Radiologist, Medication, MD;  Location: [place];  Service: Radiology;  Laterality: N/A;  . SHOULDER SURGERY     right 12-20-2003  . TOTAL KNEE ARTHROPLASTY Left 09/15/2019   Procedure: TOTAL KNEE ARTHROPLASTY;  Surgeon: Renette Butters, MD;  Location: [facility];  Service: Orthopedics;  Laterality: Left;  . TRIGGER FINGER RELEASE     03-21-98 right hand ring finger  . US ECHOCARDIOGRAPHY  07/20/2009   LA & RA mildly dilated,trace MR,TR.    Current Outpatient Medications  Medication Sig Dispense Refill  . acetaminophen (TYLENOL) 650 MG CR tablet Take 650-1,300 mg by mouth every 8 (eight) hours as needed for pain.     Marland Kitchen amoxicillin (AMOXIL) 500 MG capsule Take 2,000 mg by mouth See admin instructions. Take 4 capsules (2000 mg) by mouth 1 hour prior to dental appointment    . aspirin EC 81 MG tablet Take 81 mg by mouth daily. Swallow whole.    . Biotin 5000 MCG TABS Take 5,000 mcg by mouth daily.     . calcium elemental as carbonate (BARIATRIC TUMS ULTRA) 400 MG chewable tablet Chew 2,000-3,000 mg by mouth daily as needed for heartburn.    . Cholecalciferol (VITAMIN D) 2000 UNITS CAPS Take 2,000 Units by mouth at bedtime.     . clobetasol cream (TEMOVATE) [id number] % Apply 1 application topically 2 (two) times daily as needed (itching/dry skin.).     Marland Kitchen diphenhydrAMINE (BENADRYL) 50 MG tablet Take 50 mg by mouth at bedtime as needed for sleep (sleep.).     Marland Kitchen Emollient (AQUAPHOR EX) Apply 1 application topically 3 (three) times daily as needed (severe dry skin.).    Marland Kitchen Hydrocortisone, Perianal, (PROCTO-PAK) 1 % CREA Apply 1 application topically in the morning and  at bedtime. As needed to external hemorrhoid 28 g 1  . loratadine (CLARITIN) 10 MG tablet Take 10 mg by mouth daily.     Marland Kitchen Lysine HCl 500 MG TABS Take 500 mg by mouth at bedtime.     . Melatonin 5 MG TABS Take 5 mg by mouth at bedtime as needed (sleep).    . Omega-3 Fatty Acids (FISH OIL) 1200 MG CAPS Take 1,200 mg by mouth at bedtime.    Marland Kitchen oxybutynin (DITROPAN) 5 MG tablet Take 1 tablet (5 mg total) by mouth daily. (Patient taking differently: Take 2.5 mg by mouth daily.) 30 tablet 11  . pantoprazole (PROTONIX) 40 MG tablet Take 40 mg by mouth daily.   2  . simvastatin (ZOCOR) 40 MG tablet Take 1 tablet (40 mg total) by mouth daily. (Patient taking differently: Take 40 mg by  mouth every evening.) 90 tablet 3  . tadalafil (CIALIS) 5 MG tablet Take 1 tablet (5 mg total) by mouth every evening. 90 tablet 3  . tamsulosin (FLOMAX) 0.4 MG CAPS capsule Take 1 capsule (0.4 mg total) by mouth 2 (two) times daily. 180 capsule 3  . valACYclovir (VALTREX) 1000 MG tablet Take 500 mg by mouth daily as needed (fever blisters.).      No current facility-administered medications for this visit.    Allergies as of 06/17/2020 - Review Complete 06/17/2020  Allergen Reaction Noted  . Adhesive [tape] Hives, Rash, and Other (See Comments) 05/26/2013    Family History  Problem Relation Age of Onset  . Heart attack Father   . Dementia Mother   . Heart disease Other   . Colon cancer Neg Hx     Social History   Socioeconomic History  . Marital status: Widowed    Spouse name: Not on file  . Number of children: Not on file  . Years of education: Not on file  . Highest education level: Not on file  Occupational History  . Not on file  Tobacco Use  . Smoking status: Former Smoker    Packs/day: 1.00    Years: 20.00    Pack years: 20.00    Types: Cigarettes    Quit date: 1972    Years since quitting: 50.0  . Smokeless tobacco: Never Used  . Tobacco comment: quit smokin cigarettes at age 58  Vaping  Use  . Vaping Use: Never used  Substance and Sexual Activity  . Alcohol use: No  . Drug use: No  . Sexual activity: Not Currently  Other Topics Concern  . Not on file  Social History Narrative  . Not on file   Social Determinants of Health   Financial Resource Strain: Not on file  Food Insecurity: Not on file  Transportation Needs: Not on file  Physical Activity: Not on file  Stress: Not on file  Social Connections: Not on file    Review of Systems: Gen: Denies fever, chills, anorexia. Denies fatigue, weakness, weight loss.  CV: Denies chest pain, palpitations, syncope, peripheral edema, and claudication. Resp: Denies dyspnea at rest, cough, wheezing, coughing up blood, and pleurisy. GI: see HPI Derm: Denies rash, itching, dry skin Psych: Denies depression, anxiety, memory loss, confusion. No homicidal or suicidal ideation.  Heme: see HPI  Physical Exam: BP 135/70   Pulse (!) 59   Temp (!) 97.1 F (36.2 C)   Ht 5\' 10"  (1.778 m)   Wt 231 lb 9.6 oz (105.1 kg)   BMI 33.23 kg/m  General:   Alert and oriented. No distress noted. Pleasant and cooperative.  Head:  Normocephalic and atraumatic. Eyes:  Conjuctiva clear without scleral icterus. Mouth:  Mask in place Rectal: no external hemorrhoids. DRE without mass. Decision to band. See below.  Msk:  Symmetrical without gross deformities. Normal posture. Extremities:  Without edema. Neurologic:  Alert and  oriented x4 Psych:  Alert and cooperative. Normal mood and affect.  [facility] Banding Note:   The patient presents with symptomatic grade 2 hemorrhoids, unresponsive to maximal medical therapy, requesting rubber band ligation of his hemorrhoidal disease. All risks, benefits, and alternative forms of therapy were described and informed consent was obtained.  The decision was made to band neutrally, and the Riddle was used to perform band ligation without complication. Digital anorectal examination was then performed  to assure proper positioning of the band, and to adjust the  banded tissue as required. Appeared to be right anterior position. Interestingly, this coincides with history of external hemorrhoid at this position. Hopefully, this will help improve any persistent external manifestations. The patient was discharged home without pain or other issues. Dietary and behavioral recommendations were given and (if necessary prescriptions were given), along with follow-up instructions.  No complications were encountered and the patient tolerated the procedure well.  ASSESSMENT: Casey Castillo is a 75 y.o. male presenting today with history of symptomatic hemorrhoid s/p banding early last year of all 3 columns and neutrally; he has had some mild flares, and the decision was made to band again neutrally today. He did well with this, with a good amount of tissue captured from right anterior column noted s/p banding.   Will have him return in 6 months or sooner if needed. I suspect this will help alleviate the minor intermittent symptoms he has had.    PLAN:  Return in 6 months  Colonoscopy in 2024   Annitta Needs, PhD, ANP-BC [facility]

## 2020-06-17 NOTE — Patient Instructions (Signed)
We will see you in 6 months!  Please call with any concerns.  Have a great New Year!  I enjoyed seeing you again today! As you know, I value our relationship and want to provide genuine, compassionate, and quality care. I welcome your feedback. If you receive a survey regarding your visit,  I greatly appreciate you taking time to fill this out. See you next time!  Annitta Needs, PhD, ANP-BC [facility]

## 2020-06-29 DIAGNOSIS — F4312 Post-traumatic stress disorder, chronic: Secondary | ICD-10-CM | POA: Diagnosis not present

## 2020-07-09 DIAGNOSIS — N400 Enlarged prostate without lower urinary tract symptoms: Secondary | ICD-10-CM | POA: Diagnosis not present

## 2020-07-09 DIAGNOSIS — E782 Mixed hyperlipidemia: Secondary | ICD-10-CM | POA: Diagnosis not present

## 2020-07-09 DIAGNOSIS — I10 Essential (primary) hypertension: Secondary | ICD-10-CM | POA: Diagnosis not present

## 2020-07-09 DIAGNOSIS — E6609 Other obesity due to excess calories: Secondary | ICD-10-CM | POA: Diagnosis not present

## 2020-07-15 DIAGNOSIS — E6609 Other obesity due to excess calories: Secondary | ICD-10-CM | POA: Diagnosis not present

## 2020-07-15 DIAGNOSIS — I10 Essential (primary) hypertension: Secondary | ICD-10-CM | POA: Diagnosis not present

## 2020-07-15 DIAGNOSIS — J329 Chronic sinusitis, unspecified: Secondary | ICD-10-CM | POA: Diagnosis not present

## 2020-07-15 DIAGNOSIS — Z6834 Body mass index (BMI) 34.0-34.9, adult: Secondary | ICD-10-CM | POA: Diagnosis not present

## 2020-07-15 DIAGNOSIS — Z1331 Encounter for screening for depression: Secondary | ICD-10-CM | POA: Diagnosis not present

## 2020-08-08 DIAGNOSIS — I10 Essential (primary) hypertension: Secondary | ICD-10-CM | POA: Diagnosis not present

## 2020-08-08 DIAGNOSIS — N400 Enlarged prostate without lower urinary tract symptoms: Secondary | ICD-10-CM | POA: Diagnosis not present

## 2020-08-08 DIAGNOSIS — E6609 Other obesity due to excess calories: Secondary | ICD-10-CM | POA: Diagnosis not present

## 2020-08-08 DIAGNOSIS — E7849 Other hyperlipidemia: Secondary | ICD-10-CM | POA: Diagnosis not present

## 2020-08-09 DIAGNOSIS — F4312 Post-traumatic stress disorder, chronic: Secondary | ICD-10-CM | POA: Diagnosis not present

## 2020-09-14 DIAGNOSIS — M1712 Unilateral primary osteoarthritis, left knee: Secondary | ICD-10-CM | POA: Diagnosis not present

## 2020-09-15 DIAGNOSIS — X32XXXD Exposure to sunlight, subsequent encounter: Secondary | ICD-10-CM | POA: Diagnosis not present

## 2020-09-15 DIAGNOSIS — L308 Other specified dermatitis: Secondary | ICD-10-CM | POA: Diagnosis not present

## 2020-09-15 DIAGNOSIS — L821 Other seborrheic keratosis: Secondary | ICD-10-CM | POA: Diagnosis not present

## 2020-09-15 DIAGNOSIS — L570 Actinic keratosis: Secondary | ICD-10-CM | POA: Diagnosis not present

## 2020-09-20 ENCOUNTER — Ambulatory Visit: Payer: PPO | Admitting: Cardiovascular Disease

## 2020-09-20 ENCOUNTER — Other Ambulatory Visit: Payer: Self-pay

## 2020-09-20 VITALS — BP 118/74 | HR 66 | Ht 70.0 in | Wt 229.0 lb

## 2020-09-20 DIAGNOSIS — E782 Mixed hyperlipidemia: Secondary | ICD-10-CM | POA: Diagnosis not present

## 2020-09-20 DIAGNOSIS — E669 Obesity, unspecified: Secondary | ICD-10-CM

## 2020-09-20 DIAGNOSIS — R7303 Prediabetes: Secondary | ICD-10-CM

## 2020-09-20 DIAGNOSIS — Z86718 Personal history of other venous thrombosis and embolism: Secondary | ICD-10-CM

## 2020-09-20 DIAGNOSIS — I2510 Atherosclerotic heart disease of native coronary artery without angina pectoris: Secondary | ICD-10-CM | POA: Diagnosis not present

## 2020-09-20 NOTE — Patient Instructions (Signed)

## 2020-09-20 NOTE — Progress Notes (Signed)
Patient ID: Casey Castillo, male   DOB: May 29, 1946, 75 y.o.   MRN: [id number]    Cardiology Office Note    Date:  09/20/2020   ID:  Casey Castillo, DOB 06/28/1945, MRN [id number]  PCP:  Redmond School, MD  Cardiologist:   Sanda Klein, MD   Chief Complaint  Patient presents with  . Follow-up  . Edema    Legs.  . Chest Pain    History of Present Illness:  Casey Castillo is a 75 y.o. male with a history of remote DVT of the lower extremity, no longer on anticoagulation, coronary atherosclerosis without severe stenoses (remote coronary angiography) and hyperlipidemia.  He is generally had a good year, or any cardiovascular complaints, but he has gained weight.  He has recovered well from left knee replacement surgery and is walking without any assistance.  He has almost complete extension of that knee.  There is been a slight deterioration in glycemic control with hemoglobin A1c going from 6.1% to 6.3%.  According to a letter from the [facility] his triglycerides have also increased, but I did not get a copy of his lipid profile.  According to [facility], his primary care provider at the [facility] was very pleased with his cholesterol level.  He continues to exercise regularly.The patient specifically denies any chest pain at rest exertion, dyspnea at rest or with exertion, orthopnea, paroxysmal nocturnal dyspnea, syncope, palpitations, focal neurological deficits, intermittent claudication,  unexplained weight gain, cough, hemoptysis or wheezing.  He is mild bilateral lower extremity edema in the ankles and lower half of the calves, that is worse at the end of the day and resolves after overnight supine position.  The edema is always symmetrical.   Past Medical History:  Diagnosis Date  . Arthritis   . Cervical myelopathy with cervical radiculopathy (HCC)   . Claustrophobia   . Coronary artery disease    5%blockage on left anterior descending Dr. Sallyanne Kuster   . Diverticulitis    hx  of  . Enlarged prostate    takes flomax  . GERD (gastroesophageal reflux disease)   . History of blood clots    november, 5 1995- left leg; 2014 - left calf  . History of kidney stones   . Hyperlipidemia    primary Dr. Selena Batten  . Pneumonia    as a child  35 YEARS OLD  . PTSD (post-traumatic stress disorder)   . PTSD (post-traumatic stress disorder)   . Seasonal allergies   . Urinary frequency    hx of     Past Surgical History:  Procedure Laterality Date  . ANTERIOR CERVICAL DECOMP/DISCECTOMY FUSION  01/28/2012   Procedure: ANTERIOR CERVICAL DECOMPRESSION/DISCECTOMY FUSION 2 LEVELS;  Surgeon: Kristeen Miss, MD;  Location: [place] [facility];  Service: Neurosurgery;  Laterality: N/A;  Cervical six-seven, cervical seven-thoracic one Anterior cervical decompression/diskectomy/fusion  . ANTERIOR CERVICAL DECOMP/DISCECTOMY FUSION N/A 12/24/2017   Procedure: ANTERIOR CERVICAL DECOMPRESSION/DISCECTOMY FUSION CERVICAL FOUR-FIVE/CERVICAL FIVE-SIX TWO LEVELS;  Surgeon: Kristeen Miss, MD;  Location: [place];  Service: Neurosurgery;  Laterality: N/A;  ANTERIOR CERVICAL DECOMPRESSION/DISCECTOMY FUSION CERVICAL FOUR-FIVE/CERVICAL FIVE-SIX TWO LEVELS  . CARDIAC CATHETERIZATION     2006  . CARDIOVASCULAR STRESS TEST     2006 and 2008 at [facility] heart and vascular, 2011 myoview and echo  . COLONOSCOPY    . COLONOSCOPY N/A 08/30/2015   Dr. Arnoldo Morale: one 20 mm polyp in proximal ascending colon, removed piecemeal using hot snare. Resected and retrieved. Diverticulosis in sigmoid  colon. Path with tubular adenoma, no high grade dysplasia. 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 COLONOSCOPY N/A 08/26/2019   sigmoid and descending colon diverticulosis, seven 3-7 mm polyps in cecum, (tubular adenomas and sessile serrated) four 4-6 mm polyps in ascending colon (tubular adenomas), two 4-6 mm polyps in sigmoid and splenic flexure (tubular adenomas), one 25 mm polyp in ascending colon  (tubular adenoma) removed via piecemeal fashion  . COLONOSCOPY WITH PROPOFOL N/A 02/25/2020   Colonoscopy Sept 2021: two 2-4 mm polyps in rectum and cecum, diverticulosis in sigmoid and descending colon. Minimal hemorrhoids remain. Tubular adenomas. 3 years.   . engrown  toenail Bilateral   . EYE SURGERY     Laser for torn retina  . HEMORRHOID SURGERY     sept 8, 2008  . KIDNEY STONE SURGERY     lithotripsy 1980's  . KNEE SURGERY     left 04-11-94 ( has had two surgeries to left knee)  . NASAL SINUS SURGERY     Jun 30 1998  . NM MYOVIEW LTD  07/20/2009   no ischemia  . PILONIDAL CYST / SINUS EXCISION     surgery 01-08-74  . POLYPECTOMY  08/26/2019   Procedure: POLYPECTOMY;  Surgeon: Daneil Dolin, MD;  [facility];  Service: Endoscopy;;  . POLYPECTOMY  02/25/2020   Procedure: POLYPECTOMY;  Surgeon: Daneil Dolin, MD;  [facility];  Service: Endoscopy;;  . RADIOLOGY WITH ANESTHESIA N/A 06/12/2018   Procedure: MRI OF CHEST WITH AND WITHOUT CONTRAST;  Surgeon: Radiologist, Medication, MD;  Location: [place];  Service: Radiology;  Laterality: N/A;  . SHOULDER SURGERY     right 12-20-2003  . TOTAL KNEE ARTHROPLASTY Left 09/15/2019   Procedure: TOTAL KNEE ARTHROPLASTY;  Surgeon: Renette Butters, MD;  [facility]: [facility];  Service: Orthopedics;  Laterality: Left;  . TRIGGER FINGER RELEASE     03-21-98 right hand ring finger  . US ECHOCARDIOGRAPHY  07/20/2009   LA & RA mildly dilated,trace MR,TR.    Current Outpatient Medications  Medication Sig Dispense Refill  . acetaminophen (TYLENOL) 650 MG CR tablet Take 650-1,300 mg by mouth every 8 (eight) hours as needed for pain.     Marland Kitchen amoxicillin (AMOXIL) 500 MG capsule Take 2,000 mg by mouth See admin instructions. Take 4 capsules (2000 mg) by mouth 1 hour prior to dental appointment    . aspirin EC 81 MG tablet Take 81 mg by mouth daily. Swallow whole.    . Biotin 5000 MCG TABS Take 5,000 mcg by mouth daily.     . calcium  elemental as carbonate (BARIATRIC TUMS ULTRA) 400 MG chewable tablet Chew 2,000-3,000 mg by mouth daily as needed for heartburn.    . Cholecalciferol (VITAMIN D) 2000 UNITS CAPS Take 2,000 Units by mouth at bedtime.     . clobetasol cream (TEMOVATE) 9.37 % Apply 1 application topically 2 (two) times daily as needed (itching/dry skin.).     Marland Kitchen diphenhydrAMINE (BENADRYL) 50 MG tablet Take 50 mg by mouth at bedtime as needed for sleep (sleep.).     Marland Kitchen Emollient (AQUAPHOR EX) Apply 1 application topically 3 (three) times daily as needed (severe dry skin.).    Marland Kitchen Hydrocortisone, Perianal, (PROCTO-PAK) 1 % CREA Apply 1 application topically in the morning and at bedtime. As needed to external hemorrhoid 28 g 1  . loratadine (CLARITIN) 10 MG tablet Take 10 mg by mouth daily.     Marland Kitchen Lysine HCl  500 MG TABS Take 500 mg by mouth at bedtime.     . Melatonin 5 MG TABS Take 5 mg by mouth at bedtime as needed (sleep).    . Omega-3 Fatty Acids (FISH OIL) 1200 MG CAPS Take 1,200 mg by mouth at bedtime.    Marland Kitchen oxybutynin (DITROPAN) 5 MG tablet Take 1 tablet (5 mg total) by mouth daily. (Patient taking differently: Take 2.5 mg by mouth daily.) 30 tablet 11  . pantoprazole (PROTONIX) 40 MG tablet Take 40 mg by mouth daily.   2  . simvastatin (ZOCOR) 40 MG tablet Take 1 tablet (40 mg total) by mouth daily. (Patient taking differently: Take 40 mg by mouth every evening.) 90 tablet 3  . tadalafil (CIALIS) 5 MG tablet Take 1 tablet (5 mg total) by mouth every evening. 90 tablet 3  . tamsulosin (FLOMAX) 0.4 MG CAPS capsule Take 1 capsule (0.4 mg total) by mouth 2 (two) times daily. 180 capsule 3  . valACYclovir (VALTREX) 1000 MG tablet Take 500 mg by mouth daily as needed (fever blisters.).      No current facility-administered medications for this visit.    Allergies:   Adhesive [tape]   Social History   Socioeconomic History  . Marital status: Widowed    Spouse name: Not on file  . Number of children: Not on file  .  Years of education: Not on file  . Highest education level: Not on file  Occupational History  . Not on file  Tobacco Use  . Smoking status: Former Smoker    Packs/day: 1.00    Years: 20.00    Pack years: 20.00    Types: Cigarettes    Quit date: 1972    Years since quitting: 50.3  . Smokeless tobacco: Never Used  . Tobacco comment: quit smokin cigarettes at age 62  Vaping Use  . Vaping Use: Never used  Substance and Sexual Activity  . Alcohol use: No  . Drug use: No  . Sexual activity: Not Currently  Other Topics Concern  . Not on file  Social History Narrative  . Not on file   Social Determinants of Health   Financial Resource Strain: Not on file  Food Insecurity: Not on file  Transportation Needs: Not on file  Physical Activity: Not on file  Stress: Not on file  Social Connections: Not on file     Family History:  The patient's family history includes Dementia in his mother; Heart attack in his father; Heart disease in an other family member.   ROS:   Please see the history of present illness.    Review of Systems  Musculoskeletal: Positive for arthritis, joint pain and joint swelling.   All other systems are reviewed and are negative.   PHYSICAL EXAM:   VS:  BP 118/74 (BP Location: Left Arm, Patient Position: Sitting, Cuff Size: Normal)   Pulse 66   Ht 5\' 10"  (1.778 m)   Wt 229 lb (103.9 kg)   BMI 32.86 kg/m      General: Alert, oriented x3, no distress, obese Head: no evidence of trauma, PERRL, EOMI, no exophtalmos or lid lag, no myxedema, no xanthelasma; normal ears, nose and oropharynx Neck: normal jugular venous pulsations and no hepatojugular reflux; brisk carotid pulses without delay and no carotid bruits Chest: clear to auscultation, no signs of consolidation by percussion or palpation, normal fremitus, symmetrical and full respiratory excursions Cardiovascular: normal position and quality of the apical impulse, regular rhythm, normal first and  second heart sounds, no murmurs, rubs or gallops Abdomen: no tenderness or distention, no masses by palpation, no abnormal pulsatility or arterial bruits, normal bowel sounds, no hepatosplenomegaly Extremities: no clubbing, cyanosis or edema; 2+ radial, ulnar and brachial pulses bilaterally; 2+ right femoral, posterior tibial and dorsalis pedis pulses; 2+ left femoral, posterior tibial and dorsalis pedis pulses; no subclavian or femoral bruits Neurological: grossly nonfocal Psych: Normal mood and affect    Wt Readings from Last 3 Encounters:  09/20/20 229 lb (103.9 kg)  06/17/20 231 lb 9.6 oz (105.1 kg)  04/12/20 227 lb 3.2 oz (103.1 kg)      Studies/Labs Reviewed:   EKG:  EKG is for today shows normal sinus rhythm, voltage only criteria for LVH, borderline first-degree AV block  Recent Labs:  09/04/2019 hemoglobin 14.3, creatinine 1.08, potassium 4.2  Lipid Panel 07/31/2019 total cholesterol 149, HDL 46, LDL 85, triglycerides 97  02/11/2020 [facility] [place], [place] Glucose 101, potassium 4.5, creatinine 1.18 (GFR 60), hemoglobin 14.3, hemoglobin A1c 6.3%, normal liver function test, TSH 1.63.  Lipid profile not submitted. ASSESSMENT:    1. Coronary artery disease involving native coronary artery of native heart without angina pectoris   2. Mixed hyperlipidemia   3. Mild obesity   4. Prediabetes   5. History of deep vein thrombosis (DVT) of lower extremity      PLAN:  In order of problems listed above:    1. CAD:  Does not have angina pectoris even more physically active.  On aspirin and statin. 2. HLP:  Told him that his target LDL cholesterol is less than 70.  He has reviewed his labs from the [facility] hospital and send [place] a copy 3. Obesity: He has continued to gain weight during the Covid pandemic.  Encouraged him to avoid sweets (he has a weakness for Hershey's kisses and other candies) and to avoid starches with high glycemic index (potatoes, white bread, white rice, pasta, etc.) and  prefer low starch foods and vegetables. 4. Hx of DVT:  Remote, no recurrence, no longer on full anticoagulation.  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  Medication Adjustments/Labs and Tests Ordered: Current medicines are reviewed at length with the patient today.  Concerns regarding medicines are outlined above.  Medication changes, Labs and Tests ordered today are listed below. Patient Instructions  Medication Instructions:  No changes *If you need a refill on your cardiac medications before your next appointment, please call your pharmacy*   Lab Work: None ordered If you have labs (blood work) drawn today and your tests are completely normal, you will receive your results only by: Marland Kitchen MyChart Message (if you have MyChart) OR . A paper copy in the mail If you have any lab test that is abnormal or we need to change your treatment, we will call you to review the results.   Testing/Procedures: None ordered   Follow-Up: At [facility], you and your health needs are our priority.  As part of our continuing mission to provide you with exceptional heart care, we have created designated Provider Care Teams.  These Care Teams include your primary Cardiologist (physician) and Advanced Practice Providers (APPs -  Physician Assistants and Nurse Practitioners) who all work together to provide you with the care you need, when you need it.  We recommend signing up for the patient portal called "MyChart".  Sign up information is provided on this After Visit Summary.  MyChart is used to connect with patients for Virtual Visits (Telemedicine).  Patients are able to view lab/test results, encounter notes, upcoming appointments, etc.  Non-urgent messages can be sent to your provider as well.   To learn more about what you can do with MyChart, go to [email or web address].    Your next appointment:   12  month(s)  The format for your next appointment:   In Person  Provider:   You may see Sanda Klein, MD or one of the following Advanced Practice Providers on your designated Care Team:    Almyra Deforest, PA-C  Fabian Sharp, [place] or   Roby Lofts, PA-C      Signed, Sanda Klein, MD  09/20/2020 1:15 PM    [facility] [address], [place], [place]  [address] Phone: [phone]; Fax: [phone]

## 2020-10-17 DIAGNOSIS — F4312 Post-traumatic stress disorder, chronic: Secondary | ICD-10-CM | POA: Diagnosis not present

## 2020-10-17 NOTE — Progress Notes (Signed)
History of Present Illness: 75 year old male comes in today for worries about testicle.  He states that when he was in high school he had a basketball game.  Apparently, he did not have to seek medical attention, but he did have.  2 weeks ago he noted a lump in his left hemiscrotum.  He states that his left side has always been somewhat sensitive.  He has not felt this lump again, but has had some numbness.  No change in urinary pattern, no fever, chills, dysuria.  Past Medical History:  Diagnosis Date  . Arthritis   . Cervical myelopathy with cervical radiculopathy (HCC)   . Claustrophobia   . Coronary artery disease    5%blockage on left anterior descending Dr. Sallyanne Kuster   . Diverticulitis    hx of  . Enlarged prostate    takes flomax  . GERD (gastroesophageal reflux disease)   . History of blood clots    november, 5 1995- left leg; 2014 - left calf  . History of kidney stones   . Hyperlipidemia    primary Dr. Selena Batten  . Pneumonia    as a child  32 YEARS OLD  . PTSD (post-traumatic stress disorder)   . PTSD (post-traumatic stress disorder)   . Seasonal allergies   . Urinary frequency    hx of     Past Surgical History:  Procedure Laterality Date  . ANTERIOR CERVICAL DECOMP/DISCECTOMY FUSION  01/28/2012   Procedure: ANTERIOR CERVICAL DECOMPRESSION/DISCECTOMY FUSION 2 LEVELS;  Surgeon: Kristeen Miss, MD;  Location: [place] [facility];  Service: Neurosurgery;  Laterality: N/A;  Cervical six-seven, cervical seven-thoracic one Anterior cervical decompression/diskectomy/fusion  . ANTERIOR CERVICAL DECOMP/DISCECTOMY FUSION N/A 12/24/2017   Procedure: ANTERIOR CERVICAL DECOMPRESSION/DISCECTOMY FUSION CERVICAL FOUR-FIVE/CERVICAL FIVE-SIX TWO LEVELS;  Surgeon: Kristeen Miss, MD;  Location: [place];  Service: Neurosurgery;  Laterality: N/A;  ANTERIOR CERVICAL DECOMPRESSION/DISCECTOMY FUSION CERVICAL FOUR-FIVE/CERVICAL FIVE-SIX TWO LEVELS  . CARDIAC CATHETERIZATION     2006  . CARDIOVASCULAR STRESS  TEST     2006 and 2008 at [facility] heart and vascular, 2011 myoview and echo  . COLONOSCOPY    . COLONOSCOPY N/A 08/30/2015   Dr. Arnoldo Morale: one 20 mm polyp in proximal ascending colon, removed piecemeal using hot snare. Resected and retrieved. Diverticulosis in sigmoid colon. Path with tubular adenoma, no high grade dysplasia. 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 COLONOSCOPY N/A 08/26/2019   sigmoid and descending colon diverticulosis, seven 3-7 mm polyps in cecum, (tubular adenomas and sessile serrated) four 4-6 mm polyps in ascending colon (tubular adenomas), two 4-6 mm polyps in sigmoid and splenic flexure (tubular adenomas), one 25 mm polyp in ascending colon (tubular adenoma) removed via piecemeal fashion  . COLONOSCOPY WITH PROPOFOL N/A 02/25/2020   Colonoscopy Sept 2021: two 2-4 mm polyps in rectum and cecum, diverticulosis in sigmoid and descending colon. Minimal hemorrhoids remain. Tubular adenomas. 3 years.   . engrown  toenail Bilateral   . EYE SURGERY     Laser for torn retina  . HEMORRHOID SURGERY     sept 8, 2008  . KIDNEY STONE SURGERY     lithotripsy 1980's  . KNEE SURGERY     left 04-11-94 ( has had two surgeries to left knee)  . NASAL SINUS SURGERY     Jun 30 1998  . NM MYOVIEW LTD  07/20/2009   no ischemia  . PILONIDAL CYST / SINUS EXCISION     surgery 01-08-74  . POLYPECTOMY  08/26/2019  Procedure: POLYPECTOMY;  Surgeon: Daneil Dolin, MD;  [facility];  Service: Endoscopy;;  . POLYPECTOMY  02/25/2020   Procedure: POLYPECTOMY;  Surgeon: Daneil Dolin, MD;  [facility];  Service: Endoscopy;;  . RADIOLOGY WITH ANESTHESIA N/A 06/12/2018   Procedure: MRI OF CHEST WITH AND WITHOUT CONTRAST;  Surgeon: Radiologist, Medication, MD;  Location: [place];  Service: Radiology;  Laterality: N/A;  . SHOULDER SURGERY     right 12-20-2003  . TOTAL KNEE ARTHROPLASTY Left 09/15/2019   Procedure: TOTAL KNEE ARTHROPLASTY;  Surgeon:  Renette Butters, MD;  [facility];  Service: Orthopedics;  Laterality: Left;  . TRIGGER FINGER RELEASE     03-21-98 right hand ring finger  . US ECHOCARDIOGRAPHY  07/20/2009   LA & RA mildly dilated,trace MR,TR.    Home Medications:  Allergies as of 10/18/2020      Reactions   Adhesive [tape] Hives, Rash, Other (See Comments)   EKG electrodes cause whelps.  Severe PLEASE WIPE OFF WITH ALCOHOL SWAB.      Medication List       Accurate as of Oct 17, 2020  9:07 PM. If you have any questions, ask your nurse or doctor.        acetaminophen 650 MG CR tablet Commonly known as: TYLENOL Take 650-1,300 mg by mouth every 8 (eight) hours as needed for pain.   amoxicillin 500 MG capsule Commonly known as: AMOXIL Take 2,000 mg by mouth See admin instructions. Take 4 capsules (2000 mg) by mouth 1 hour prior to dental appointment   AQUAPHOR EX Apply 1 application topically 3 (three) times daily as needed (severe dry skin.).   aspirin EC 81 MG tablet Take 81 mg by mouth daily. Swallow whole.   Biotin 5000 MCG Tabs Take 5,000 mcg by mouth daily.   calcium elemental as carbonate 400 MG chewable tablet Commonly known as: BARIATRIC TUMS ULTRA Chew 2,000-3,000 mg by mouth daily as needed for heartburn.   clobetasol cream 0.05 % Commonly known as: TEMOVATE Apply 1 application topically 2 (two) times daily as needed (itching/dry skin.).   diphenhydrAMINE 50 MG tablet Commonly known as: BENADRYL Take 50 mg by mouth at bedtime as needed for sleep (sleep.).   Fish Oil 1200 MG Caps Take 1,200 mg by mouth at bedtime.   Hydrocortisone (Perianal) 1 % Crea Commonly known as: Procto-Pak Apply 1 application topically in the morning and at bedtime. As needed to external hemorrhoid   loratadine 10 MG tablet Commonly known as: CLARITIN Take 10 mg by mouth daily.   Lysine HCl 500 MG Tabs Take 500 mg by mouth at bedtime.   melatonin 5 MG Tabs Take 5 mg by mouth at bedtime as needed  (sleep).   oxybutynin 5 MG tablet Commonly known as: DITROPAN Take 1 tablet (5 mg total) by mouth daily. What changed: how much to take   pantoprazole 40 MG tablet Commonly known as: PROTONIX Take 40 mg by mouth daily.   simvastatin 40 MG tablet Commonly known as: ZOCOR Take 1 tablet (40 mg total) by mouth daily. What changed: when to take this   tadalafil 5 MG tablet Commonly known as: CIALIS Take 1 tablet (5 mg total) by mouth every evening.   tamsulosin 0.4 MG Caps capsule Commonly known as: FLOMAX Take 1 capsule (0.4 mg total) by mouth 2 (two) times daily.   valACYclovir 1000 MG tablet Commonly known as: VALTREX Take 500 mg by mouth daily as needed (fever blisters.).  Vitamin D 50 MCG (2000 UT) Caps Take 2,000 Units by mouth at bedtime.       Allergies:  Allergies  Allergen Reactions  . Adhesive [Tape] Hives, Rash and Other (See Comments)    EKG electrodes cause whelps.  Severe PLEASE WIPE OFF WITH ALCOHOL SWAB.    Family History  Problem Relation Age of Onset  . Heart attack Father   . Dementia Mother   . Heart disease Other   . Colon cancer Neg Hx     Social History:  reports that he quit smoking about 50 years ago. His smoking use included cigarettes. He has a 20.00 pack-year smoking history. He has never used smokeless tobacco. He reports that he does not drink alcohol and does not use drugs.  ROS: A complete review of systems was performed.  All systems are negative except for pertinent findings as noted.  Physical Exam:  Vital signs in last 24 hours: There were no vitals taken for this visit. Constitutional:  Alert and oriented, No acute distress.  Somewhat anxious Cardiovascular: Regular rate  Respiratory: Normal respiratory effort GI: Abdomen is soft, nontender, nondistended, no abdominal masses. No CVAT.  Genitourinary: Normal uncircumcised scrotal skin normal.  Small hydroceles bilaterally.  Testicles normal.  Left epididymis with a cystic  structure in the head.  No tenderness.  No worrisome masses. Lymphatic: No lymphadenopathy Neurologic: Grossly intact, no focal deficits Psychiatric: Normal mood and affect  I have reviewed prior pt notes  I have reviewed notes from referring/previous physicians  I have reviewed urinalysis results  I have independently reviewed prior imaging  I have reviewed prior PSA results    Impression/Assessment:  Probable small epididymal cyst on the left  Small bilateral hydroceles  Plan:  1.  I do not think the patient needs further imaging at this point  2.  I will have him come back at his scheduled appt in November

## 2020-10-18 ENCOUNTER — Ambulatory Visit: Payer: PPO | Admitting: Urology

## 2020-10-18 ENCOUNTER — Other Ambulatory Visit: Payer: Self-pay

## 2020-10-18 ENCOUNTER — Encounter: Payer: Self-pay | Admitting: Urology

## 2020-10-18 VITALS — BP 139/75 | HR 67 | Temp 97.8°F | Ht 70.0 in | Wt 225.0 lb

## 2020-10-18 DIAGNOSIS — N433 Hydrocele, unspecified: Secondary | ICD-10-CM | POA: Diagnosis not present

## 2020-10-18 DIAGNOSIS — N3281 Overactive bladder: Secondary | ICD-10-CM | POA: Diagnosis not present

## 2020-10-18 DIAGNOSIS — N503 Cyst of epididymis: Secondary | ICD-10-CM | POA: Diagnosis not present

## 2020-10-18 LAB — URINALYSIS, ROUTINE W REFLEX MICROSCOPIC
Bilirubin, UA: NEGATIVE
Glucose, UA: NEGATIVE
Ketones, UA: NEGATIVE
Leukocytes,UA: NEGATIVE
Nitrite, UA: NEGATIVE
Protein,UA: NEGATIVE
Specific Gravity, UA: 1.02 (ref 1.005–1.030)
Urobilinogen, Ur: 0.2 mg/dL (ref 0.2–1.0)
pH, UA: 7 (ref 5.0–7.5)

## 2020-10-18 LAB — MICROSCOPIC EXAMINATION
Bacteria, UA: NONE SEEN
Renal Epithel, UA: NONE SEEN /hpf
WBC, UA: NONE SEEN /hpf (ref 0–5)

## 2020-10-18 NOTE — Progress Notes (Signed)
Urological Symptom Review  Patient is experiencing the following symptoms: Frequent urination Get up at night to urinate Leakage of urine  Kidney stones   Review of Systems  Gastrointestinal (upper)  : Negative for upper GI symptoms  Gastrointestinal (lower) : Negative for lower GI symptoms  Constitutional : Negative for symptoms  Skin: Itching  Eyes: Negative for eye symptoms  Ear/Nose/Throat : Sinus problems  Hematologic/Lymphatic: Negative for Hematologic/Lymphatic symptoms  Cardiovascular : Leg swelling  Respiratory : Negative for respiratory symptoms  Endocrine: Negative for endocrine symptoms  Musculoskeletal: Back pain  Neurological: Negative for neurological symptoms  Psychologic: Negative for psychiatric symptoms

## 2020-11-08 DIAGNOSIS — E782 Mixed hyperlipidemia: Secondary | ICD-10-CM | POA: Diagnosis not present

## 2020-11-08 DIAGNOSIS — I10 Essential (primary) hypertension: Secondary | ICD-10-CM | POA: Diagnosis not present

## 2020-12-15 ENCOUNTER — Ambulatory Visit: Payer: PPO | Admitting: Gastroenterology

## 2020-12-15 ENCOUNTER — Encounter: Payer: Self-pay | Admitting: Gastroenterology

## 2020-12-15 ENCOUNTER — Other Ambulatory Visit: Payer: Self-pay

## 2020-12-15 VITALS — BP 140/77 | HR 67 | Temp 97.8°F | Ht 70.0 in | Wt 234.0 lb

## 2020-12-15 DIAGNOSIS — K648 Other hemorrhoids: Secondary | ICD-10-CM | POA: Diagnosis not present

## 2020-12-15 NOTE — Patient Instructions (Addendum)
Continue to limit your toilet time and avoid straining. Eat diet high in fruits/vegetables and drink plenty of water to keep stools soft. You can also try benefiber daily to help prevent hard stools or miralax during times of constipation.   Use Preparation H sparingly as this is a steroid that can thin the skin, causing more irritation, you can also try Tucks pads to help with irritation.  Follow up 6 months!

## 2020-12-15 NOTE — Progress Notes (Signed)
Referring Provider: Redmond School, MD Primary Care Physician:  Redmond School, MD Primary GI Physician: [place]  Chief Complaint  Patient presents with   Hemorrhoids    Bleeding hemorrhoids   HPI:   Casey Castillo is a 75 y.o. male presenting today with a history of symptomatic hemorrhoids with multiple previous bandings.   Hemorrhoids: scant BRB when wiping after BM that he has to strain for, maybe 2-3x/week. States he uses baby wipes to clean up after BMs and that's when he notices blood. States that anal area feels irritated at times when he has to strain more and wipes hard. He does use preparation H and occasional suppositories (4-5x/year) to help with burning and itching in. States that he has 2-3 BMs per day. Reports seldom straining to have a BM and reports normal amount of toilet time. Denies specific episodes of constipation, just occasionally has a "hard movement." Denies melena or hematochezia.   Last Colonoscopy:02/25/20 Two 2 to 4 mm polyps in the rectum, in the distal rectum and in the cecum, removed, Resected and retrieved (tubular adenomas). Diverticulosis in the sigmoid colon and in the descending colon. otherwise normal on direct and retroflexion views. Minimal hemorrhoids remain.   Last Endoscopy:n/a  Recommendations:  Repeat high risk screening colonoscopy in 2024 r/t tubular adenomas  Past Medical History:  Diagnosis Date   Arthritis    Cervical myelopathy with cervical radiculopathy (HCC)    Claustrophobia    Coronary artery disease    5%blockage on left anterior descending Dr. Sallyanne Kuster    Diverticulitis    hx of   Enlarged prostate    takes flomax   GERD (gastroesophageal reflux disease)    History of blood clots    november, 5 1995- left leg; 2014 - left calf   History of kidney stones    Hyperlipidemia    primary Dr. Selena Batten   Pneumonia    as a child  75 YEARS OLD   PTSD (post-traumatic stress disorder)    PTSD (post-traumatic stress  disorder)    Seasonal allergies    Urinary frequency    hx of     Past Surgical History:  Procedure Laterality Date   ANTERIOR CERVICAL DECOMP/DISCECTOMY FUSION  01/28/2012   Procedure: ANTERIOR CERVICAL DECOMPRESSION/DISCECTOMY FUSION 2 LEVELS;  Surgeon: Kristeen Miss, MD;  [facility];  Service: Neurosurgery;  Laterality: N/A;  Cervical six-seven, cervical seven-thoracic one Anterior cervical decompression/diskectomy/fusion   ANTERIOR CERVICAL DECOMP/DISCECTOMY FUSION N/A 12/24/2017   Procedure: ANTERIOR CERVICAL DECOMPRESSION/DISCECTOMY FUSION CERVICAL FOUR-FIVE/CERVICAL FIVE-SIX TWO LEVELS;  Surgeon: Kristeen Miss, MD;  Location: [place];  Service: Neurosurgery;  Laterality: N/A;  ANTERIOR CERVICAL DECOMPRESSION/DISCECTOMY FUSION CERVICAL FOUR-FIVE/CERVICAL FIVE-SIX TWO LEVELS   CARDIAC CATHETERIZATION     2006   CARDIOVASCULAR STRESS TEST     2006 and 2008 at [facility] heart and vascular, 2011 myoview and echo   COLONOSCOPY     COLONOSCOPY N/A 08/30/2015   Dr. Arnoldo Morale: one 20 mm polyp in proximal ascending colon, removed piecemeal using hot snare. Resected and retrieved. Diverticulosis in sigmoid colon. Path with tubular adenoma, no high grade dysplasia. 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 Minimal hemorrhoids remain. Tubular adenomas. 3 years.    engrown  toenail Bilateral    EYE SURGERY     Laser for torn retina   HEMORRHOID SURGERY     sept 8,  2008   KIDNEY STONE SURGERY     lithotripsy 1980's   KNEE SURGERY     left 04-11-94 ( has had two surgeries to left knee)   NASAL SINUS SURGERY     Jun 30 1998   NM MYOVIEW LTD  07/20/2009   no ischemia   PILONIDAL CYST / SINUS EXCISION     surgery 01-08[birth year]74   POLYPECTOMY  08/26/2019   Procedure: POLYPECTOMY;  Surgeon: Daneil Dolin, MD;  [facility];  Service: Endoscopy;;   POLYPECTOMY  02/25/2020   Procedure: POLYPECTOMY;  Surgeon: Daneil Dolin, MD;  [facility];  Service: Endoscopy;;   RADIOLOGY WITH ANESTHESIA N/A 06/12/2018   Procedure: MRI OF CHEST WITH AND WITHOUT CONTRAST;  Surgeon: Radiologist, Medication, MD;  Location: [place];  Service: Radiology;  Laterality: N/A;   SHOULDER SURGERY     right 12-20-2003   TOTAL KNEE ARTHROPLASTY Left 09/15/2019   Procedure: TOTAL KNEE ARTHROPLASTY;  Surgeon: Renette Butters, MD;  Location: [facility];  Service: Orthopedics;  Laterality: Left;   TRIGGER FINGER RELEASE     03-21-98 right hand ring finger   US ECHOCARDIOGRAPHY  07/20/2009   LA & RA mildly dilated,trace MR,TR.    Current Outpatient Medications  Medication Sig Dispense Refill   acetaminophen (TYLENOL) 650 MG CR tablet Take 650-1,300 mg by mouth as needed for pain.     amoxicillin (AMOXIL) 500 MG capsule Take 2,000 mg by mouth See admin instructions. Take 4 capsules (2000 mg) by mouth 1 hour prior to dental appointment     aspirin EC 81 MG tablet Take 81 mg by mouth daily. Swallow whole.     Biotin 5000 MCG TABS Take 5,000 mcg by mouth daily.      calcium elemental as carbonate (BARIATRIC TUMS ULTRA) 400 MG chewable tablet Chew 2,000-3,000 mg by mouth daily as needed for heartburn.     Cholecalciferol (VITAMIN D) 2000 UNITS CAPS Take 2,000 Units by mouth at bedtime.      clobetasol cream (TEMOVATE) 7.06 % Apply 1 application topically 2 (two) times daily as needed (itching/dry skin.).      diphenhydrAMINE (BENADRYL) 50 MG tablet Take 50 mg by mouth at bedtime as  needed for sleep (sleep.).      Emollient (AQUAPHOR EX) Apply 1 application topically 3 (three) times daily as needed (severe dry skin.).     loratadine (CLARITIN) 10 MG tablet Take 10 mg by mouth daily.      Lysine HCl 500 MG TABS Take 500 mg by mouth at bedtime.      Melatonin 5 MG TABS Take 5 mg by mouth at bedtime as needed (sleep).     Omega-3 Fatty Acids (FISH OIL) 1200 MG CAPS Take 1,200 mg by mouth at bedtime.     oxybutynin (DITROPAN) 5 MG tablet Take 1 tablet (5 mg total) by mouth daily. (Patient taking differently: Take 2.5 mg by mouth daily.) 30 tablet 11   pantoprazole (PROTONIX) 40 MG tablet Take 40 mg by mouth 2 (two) times daily.  2   simvastatin (ZOCOR) 40 MG tablet  Take 1 tablet (40 mg total) by mouth daily. (Patient taking differently: Take 40 mg by mouth every evening.) 90 tablet 3   tadalafil (CIALIS) 5 MG tablet Take 1 tablet (5 mg total) by mouth every evening. 90 tablet 3   tamsulosin (FLOMAX) 0.4 MG CAPS capsule Take 1 capsule (0.4 mg total) by mouth 2 (two) times daily. 180 capsule 3   valACYclovir (VALTREX) 1000 MG tablet Take 500 mg by mouth daily as needed (fever blisters.).      Hydrocortisone, Perianal, (PROCTO-PAK) 1 % CREA Apply 1 application topically in the morning and at bedtime. As needed to external hemorrhoid (Patient not taking: Reported on 12/15/2020) 28 g 1   No current facility-administered medications for this visit.    Allergies as of 12/15/2020 - Review Complete 12/15/2020  Allergen Reaction Noted   Adhesive [tape] Hives, Rash, and Other (See Comments) 05/26/2013    Family History  Problem Relation Age of Onset   Heart attack Father    Dementia Mother    Heart disease Other    Colon cancer Neg Hx     Social History   Socioeconomic History   Marital status: Widowed    Spouse name: Not on file   Number of children: Not on file   Years of education: Not on file   Highest education level: Not on file  Occupational History   Not on file   Tobacco Use   Smoking status: Former    Packs/day: 1.00    Years: 20.00    Pack years: 20.00    Types: Cigarettes    Quit date: 31    Years since quitting: 50.5   Smokeless tobacco: Never   Tobacco comments:    quit smokin cigarettes at age 60  Vaping Use   Vaping Use: Never used  Substance and Sexual Activity   Alcohol use: No   Drug use: No   Sexual activity: Not Currently  Other Topics Concern   Not on file  Social History Narrative   Not on file   Social Determinants of Health   Financial Resource Strain: Not on file  Food Insecurity: Not on file  Transportation Needs: Not on file  Physical Activity: Not on file  Stress: Not on file  Social Connections: Not on file   Review of Systems: Gen: Denies fever, chills, anorexia. Denies fatigue, weakness, weight loss.  CV: Denies chest pain, palpitations, syncope, peripheral edema, and claudication. Resp: Denies dyspnea at rest, cough, wheezing, coughing up blood, and pleurisy. GI: Denies vomiting blood, jaundice, and fecal incontinence. Denies dysphagia or odynophagia. Derm: Denies rash, itching, dry skin Psych: Denies depression, anxiety, memory loss, confusion. No homicidal or suicidal ideation.  Heme: Denies bruising, bleeding, and enlarged lymph nodes.  Physical Exam: BP 140/77   Pulse 67   Temp 97.8 F (36.6 C) (Temporal)   Ht 5\' 10"  (1.778 m)   Wt 234 lb (106.1 kg)   BMI 33.58 kg/m  General:   Alert and oriented. No distress noted. Pleasant and cooperative.  Heart: Normal rate and rhythm, s1 and s2 heart sounds present.  Lungs: Clear lung sounds in all lobes. Respirations equal and unlabored. Abdomen:  +BS, soft, non-tender and non-distended. No rebound or guarding. No HSM or masses noted. Derm: No palmar erythema or jaundice Msk:  Symmetrical without gross deformities. Normal posture. Extremities:  Without edema. Neurologic:  Alert and  oriented x4 Psych:  Alert and cooperative. Normal mood and  affect.  ASSESSMENT: CHRISTY FRIEDE is  a 75 y.o. male presenting today with history of hemorrhoids. Hemorrhoid banding done multiple times in the past. States that he has occasional BRB when wiping if he has had to strain to have a BM or has vigorously cleaned himself. Suspect BRBPR seen when wiping is secondary to excoriation of anal area due to vigorous wiping/straining when having BMs.   Discussed limiting toilet time, increasing fiber and water intake, using preparation H. sparingly since steroids can thin skin. Can use tucks to help with irritation. Low suspicion for malignant etiology of BRBPR as colonoscopy 02/2020 was unremarkable, no blood in toilet or on stools, no weight loss, early satiety or decreased appetite, and occurrence seems to be associated with straining/excessive cleaning, as well as significant history of hemorrhoids.  PLAN:  Limit toilet time and straining 2. Increase fruits, vegetables and water intake 3. Limit use of preparation H as this can thin the tissue 4. Can try tucks pads for irritation  Follow Up: 6 months  Chelsea L. Alver Sorrow, MSN, APRN, AGNP-C Adult-Gerontology Nurse Practitioner [facility]   Addendum: patient seen in conjunction with Scherrie Gerlach, MSN, APRN, AGNP-C.  Annitta Needs, PhD, ANP-BC [facility]

## 2020-12-21 DIAGNOSIS — F4312 Post-traumatic stress disorder, chronic: Secondary | ICD-10-CM | POA: Diagnosis not present

## 2020-12-28 DIAGNOSIS — I872 Venous insufficiency (chronic) (peripheral): Secondary | ICD-10-CM | POA: Diagnosis not present

## 2020-12-28 DIAGNOSIS — L821 Other seborrheic keratosis: Secondary | ICD-10-CM | POA: Diagnosis not present

## 2020-12-28 DIAGNOSIS — L570 Actinic keratosis: Secondary | ICD-10-CM | POA: Diagnosis not present

## 2020-12-28 DIAGNOSIS — B078 Other viral warts: Secondary | ICD-10-CM | POA: Diagnosis not present

## 2020-12-28 DIAGNOSIS — X32XXXD Exposure to sunlight, subsequent encounter: Secondary | ICD-10-CM | POA: Diagnosis not present

## 2021-02-08 DIAGNOSIS — I10 Essential (primary) hypertension: Secondary | ICD-10-CM | POA: Diagnosis not present

## 2021-02-08 DIAGNOSIS — E782 Mixed hyperlipidemia: Secondary | ICD-10-CM | POA: Diagnosis not present

## 2021-03-01 DIAGNOSIS — F4312 Post-traumatic stress disorder, chronic: Secondary | ICD-10-CM | POA: Diagnosis not present

## 2021-04-04 ENCOUNTER — Other Ambulatory Visit: Payer: PPO

## 2021-04-04 ENCOUNTER — Other Ambulatory Visit: Payer: Self-pay

## 2021-04-04 DIAGNOSIS — N401 Enlarged prostate with lower urinary tract symptoms: Secondary | ICD-10-CM

## 2021-04-04 DIAGNOSIS — N138 Other obstructive and reflux uropathy: Secondary | ICD-10-CM | POA: Diagnosis not present

## 2021-04-05 LAB — PSA: Prostate Specific Ag, Serum: 1.2 ng/mL (ref 0.0–4.0)

## 2021-04-10 NOTE — Progress Notes (Signed)
History of Present Illness: Here for followup of ED/BPH.  [facility] 2.5. PSA here last week 1.2  He is on tamsulosin as well as tadalafil-he takes that every day.  Overall, he states that his urinary symptoms are minimal/nonbothersome.    Past Medical History:  Diagnosis Date   Arthritis    Cervical myelopathy with cervical radiculopathy (HCC)    Claustrophobia    Coronary artery disease    5%blockage on left anterior descending Dr. Sallyanne Kuster    Diverticulitis    hx of   Enlarged prostate    takes flomax   GERD (gastroesophageal reflux disease)    History of blood clots    november, 5 1995- left leg; 2014 - left calf   History of kidney stones    Hyperlipidemia    primary Dr. Selena Batten   Pneumonia    as a child  75 YEARS OLD   PTSD (post-traumatic stress disorder)    PTSD (post-traumatic stress disorder)    Seasonal allergies    Urinary frequency    hx of     Past Surgical History:  Procedure Laterality Date   ANTERIOR CERVICAL DECOMP/DISCECTOMY FUSION  01/28/2012   Procedure: ANTERIOR CERVICAL DECOMPRESSION/DISCECTOMY FUSION 2 LEVELS;  Surgeon: Kristeen Miss, MD;  Location: [facility];  Service: Neurosurgery;  Laterality: N/A;  Cervical six-seven, cervical seven-thoracic one Anterior cervical decompression/diskectomy/fusion   ANTERIOR CERVICAL DECOMP/DISCECTOMY FUSION N/A 12/24/2017   Procedure: ANTERIOR CERVICAL DECOMPRESSION/DISCECTOMY FUSION CERVICAL FOUR-FIVE/CERVICAL FIVE-SIX TWO LEVELS;  Surgeon: Kristeen Miss, MD;  Location: [place];  Service: Neurosurgery;  Laterality: N/A;  ANTERIOR CERVICAL DECOMPRESSION/DISCECTOMY FUSION CERVICAL FOUR-FIVE/CERVICAL FIVE-SIX TWO LEVELS   CARDIAC CATHETERIZATION     2006   CARDIOVASCULAR STRESS TEST     2006 and 2008 at [facility] heart and vascular, 2011 myoview and echo   COLONOSCOPY     COLONOSCOPY N/A 08/30/2015   Dr. Arnoldo Morale: one 20 mm polyp in proximal ascending colon, removed piecemeal using hot snare. Resected  and retrieved. Diverticulosis in sigmoid colon. Path with tubular adenoma, no high grade dysplasia. 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 Minimal hemorrhoids remain. Tubular adenomas. 3 years.    engrown  toenail Bilateral    EYE SURGERY     Laser for torn retina   HEMORRHOID SURGERY     sept 8, 2008   KIDNEY STONE SURGERY     lithotripsy 1980's   KNEE SURGERY     left 04-11-94 ( has had two surgeries to left knee)   NASAL SINUS SURGERY     Jun 30 1998   NM MYOVIEW LTD  07/20/2009   no ischemia   PILONIDAL CYST / SINUS EXCISION     surgery 01-08-74   POLYPECTOMY  08/26/2019   Procedure: POLYPECTOMY;  Surgeon: Daneil Dolin, MD;  [facility];  Service: Endoscopy;;   POLYPECTOMY  02/25/2020   Procedure: POLYPECTOMY;  Surgeon: Daneil Dolin, MD;  [facility];  Service: Endoscopy;;  RADIOLOGY WITH ANESTHESIA N/A 06/12/2018   Procedure: MRI OF CHEST WITH AND WITHOUT CONTRAST;  Surgeon: Radiologist, Medication, MD;  Location: [place];  Service: Radiology;  Laterality: N/A;   SHOULDER SURGERY     right 12-20-2003   TOTAL KNEE ARTHROPLASTY Left 09/15/2019   Procedure: TOTAL KNEE ARTHROPLASTY;  Surgeon: Renette Butters, MD;  [facility]: [facility];  Service: Orthopedics;  Laterality: Left;   TRIGGER FINGER RELEASE     03-21-98 right hand ring finger   US ECHOCARDIOGRAPHY  07/20/2009   LA & RA mildly dilated,trace MR,TR.    Home Medications:  Allergies  as of 04/11/2021       Reactions   Adhesive [tape] Hives, Rash, Other (See Comments)   EKG electrodes cause whelps.  Severe PLEASE WIPE OFF WITH ALCOHOL SWAB.        Medication List        Accurate as of April 10, 2021  7:50 PM. If you have any questions, ask your nurse or doctor.          acetaminophen 650 MG CR tablet Commonly known as: TYLENOL Take 650-1,300 mg by mouth as needed for pain.   amoxicillin 500 MG capsule Commonly known as: AMOXIL Take 2,000 mg by mouth See admin instructions. Take 4 capsules (2000 mg) by mouth 1 hour prior to dental appointment   AQUAPHOR EX Apply 1 application topically 3 (three) times daily as needed (severe dry skin.).   aspirin EC 81 MG tablet Take 81 mg by mouth daily. Swallow whole.   Biotin 5000 MCG Tabs Take 5,000 mcg by mouth daily.   calcium elemental as carbonate 400 MG chewable tablet Commonly known as: BARIATRIC TUMS ULTRA Chew 2,000-3,000 mg by mouth daily as needed for heartburn.   clobetasol cream 0.05 % Commonly known as: TEMOVATE Apply 1 application topically 2 (two) times daily as needed (itching/dry skin.).   diphenhydrAMINE 50 MG tablet Commonly known as: BENADRYL Take 50 mg by mouth at bedtime as needed for sleep (sleep.).   Fish Oil 1200 MG Caps Take 1,200 mg by mouth at bedtime.   Hydrocortisone (Perianal) 1 % Crea Commonly known as: Procto-Pak Apply 1 application topically in the morning and at bedtime. As needed to external hemorrhoid   loratadine 10 MG tablet Commonly known as: CLARITIN Take 10 mg by mouth daily.   Lysine HCl 500 MG Tabs Take 500 mg by mouth at bedtime.   melatonin 5 MG Tabs Take 5 mg by mouth at bedtime as needed (sleep).   oxybutynin 5 MG tablet Commonly known as: DITROPAN Take 1 tablet (5 mg total) by mouth daily. What changed: how much to take   pantoprazole 40 MG tablet Commonly known as: PROTONIX Take 40 mg by mouth 2 (two) times daily.   simvastatin 40 MG  tablet Commonly known as: ZOCOR Take 1 tablet (40 mg total) by mouth daily. What changed: when to take this   tadalafil 5 MG tablet Commonly known as: CIALIS Take 1 tablet (5 mg total) by mouth every evening.   tamsulosin 0.4 MG Caps capsule Commonly known as: FLOMAX Take 1 capsule (0.4 mg total) by mouth 2 (two) times daily.   valACYclovir 1000 MG tablet Commonly known as: VALTREX Take 500 mg by mouth daily as needed (fever blisters.).   Vitamin D 50 MCG (2000 UT) Caps Take 2,000 Units by mouth at bedtime.        Allergies:  Allergies  Allergen Reactions   Adhesive [Tape] Hives, Rash and  Other (See Comments)    EKG electrodes cause whelps.  Severe PLEASE WIPE OFF WITH ALCOHOL SWAB.    Family History  Problem Relation Age of Onset   Heart attack Father    Dementia Mother    Heart disease Other    Colon cancer Neg Hx     Social History:  reports that he quit smoking about 50 years ago. His smoking use included cigarettes. He has a 20.00 pack-year smoking history. He has never used smokeless tobacco. He reports that he does not drink alcohol and does not use drugs.  ROS: A complete review of systems was performed.  All systems are negative except for pertinent findings as noted.  Physical Exam:  Vital signs in last 24 hours: There were no vitals taken for this visit. Constitutional:  Alert and oriented, No acute distress Cardiovascular: Regular rate  Respiratory: Normal respiratory effort GI: Abdomen is soft, nontender, nondistended, no abdominal masses. No CVAT.  Obese, no inguinal hernias. Genitourinary: Normal male phallus, testes are descended bilaterally and non-tender and without masses, scrotum is normal in appearance without lesions or masses, perineum is normal on inspection.  Normal anal sphincter tone.  Prostate size 50 g Lymphatic: No lymphadenopathy Neurologic: Grossly intact, no focal deficits Psychiatric: Normal mood and affect  I have reviewed  prior pt notes  I have reviewed notes from referring/previous physicians  I have reviewed urinalysis results  I have reviewed prior PSA results--[facility]      Impression/Assessment:  1.  ED, adequately treated with daily tadalafil  2.  BPH, symptoms minimal on dual medical therapy  Plan:  1.  I will see him back in 1 year with PSA recheck.  2.  Medications refill

## 2021-04-11 ENCOUNTER — Other Ambulatory Visit: Payer: Self-pay

## 2021-04-11 ENCOUNTER — Ambulatory Visit: Payer: PPO | Admitting: Urology

## 2021-04-11 DIAGNOSIS — N3281 Overactive bladder: Secondary | ICD-10-CM | POA: Diagnosis not present

## 2021-04-11 DIAGNOSIS — N5201 Erectile dysfunction due to arterial insufficiency: Secondary | ICD-10-CM | POA: Diagnosis not present

## 2021-04-11 DIAGNOSIS — N138 Other obstructive and reflux uropathy: Secondary | ICD-10-CM

## 2021-04-11 DIAGNOSIS — N401 Enlarged prostate with lower urinary tract symptoms: Secondary | ICD-10-CM

## 2021-04-11 LAB — URINALYSIS, ROUTINE W REFLEX MICROSCOPIC
Bilirubin, UA: NEGATIVE
Glucose, UA: NEGATIVE
Ketones, UA: NEGATIVE
Leukocytes,UA: NEGATIVE
Nitrite, UA: NEGATIVE
Protein,UA: NEGATIVE
RBC, UA: NEGATIVE
Specific Gravity, UA: 1.015 (ref 1.005–1.030)
Urobilinogen, Ur: 0.2 mg/dL (ref 0.2–1.0)
pH, UA: 6.5 (ref 5.0–7.5)

## 2021-04-11 MED ORDER — TADALAFIL 5 MG PO TABS: 5.0000 mg | ORAL_TABLET | Freq: Every evening | ORAL | 3 refills | Status: DC

## 2021-04-11 MED ORDER — TAMSULOSIN HCL 0.4 MG PO CAPS: 0.4000 mg | ORAL_CAPSULE | Freq: Two times a day (BID) | ORAL | 3 refills | Status: DC

## 2021-04-11 NOTE — Progress Notes (Signed)
Urological Symptom Review  Patient is experiencing the following symptoms: Get up at night to urinate Leakage of urine   Review of Systems  Gastrointestinal (upper)  : Negative for upper GI symptoms  Gastrointestinal (lower) : Negative for lower GI symptoms  Constitutional : Fatigue  Skin: Negative for skin symptoms  Eyes: Negative for eye symptoms  Ear/Nose/Throat : Negative for Ear/Nose/Throat symptoms  Hematologic/Lymphatic: Negative for Hematologic/Lymphatic symptoms  Cardiovascular : Leg swelling  Respiratory : Negative for respiratory symptoms  Endocrine: Negative for endocrine symptoms  Musculoskeletal: Back pain  Neurological: Negative for neurological symptoms  Psychologic: Negative for psychiatric symptoms

## 2021-04-12 DIAGNOSIS — I10 Essential (primary) hypertension: Secondary | ICD-10-CM | POA: Diagnosis not present

## 2021-04-12 DIAGNOSIS — Z6835 Body mass index (BMI) 35.0-35.9, adult: Secondary | ICD-10-CM | POA: Diagnosis not present

## 2021-04-12 DIAGNOSIS — I872 Venous insufficiency (chronic) (peripheral): Secondary | ICD-10-CM | POA: Diagnosis not present

## 2021-04-12 DIAGNOSIS — D649 Anemia, unspecified: Secondary | ICD-10-CM | POA: Diagnosis not present

## 2021-04-12 DIAGNOSIS — E6609 Other obesity due to excess calories: Secondary | ICD-10-CM | POA: Diagnosis not present

## 2021-04-12 DIAGNOSIS — Z1331 Encounter for screening for depression: Secondary | ICD-10-CM | POA: Diagnosis not present

## 2021-04-12 DIAGNOSIS — R7303 Prediabetes: Secondary | ICD-10-CM | POA: Diagnosis not present

## 2021-04-12 DIAGNOSIS — Z0000 Encounter for general adult medical examination without abnormal findings: Secondary | ICD-10-CM | POA: Diagnosis not present

## 2021-04-12 DIAGNOSIS — R600 Localized edema: Secondary | ICD-10-CM | POA: Diagnosis not present

## 2021-04-13 ENCOUNTER — Telehealth: Payer: Self-pay

## 2021-04-13 ENCOUNTER — Other Ambulatory Visit: Payer: Self-pay

## 2021-04-13 DIAGNOSIS — N5201 Erectile dysfunction due to arterial insufficiency: Secondary | ICD-10-CM

## 2021-04-13 MED ORDER — TADALAFIL 5 MG PO TABS: 5.0000 mg | ORAL_TABLET | Freq: Every evening | ORAL | 3 refills | Status: DC

## 2021-04-13 NOTE — Telephone Encounter (Signed)
Patient called and advised he went to pick up prescription for tadalafil (CIALIS) 5 MG tablet at [facility] [id number] - [place], [name]. Pharmacy advised they didn't receive the prescription.

## 2021-04-13 NOTE — Telephone Encounter (Signed)
Rx resent. Patient called with no answer. Message left.

## 2021-04-14 ENCOUNTER — Other Ambulatory Visit: Payer: Self-pay | Admitting: Urology

## 2021-04-14 DIAGNOSIS — N5201 Erectile dysfunction due to arterial insufficiency: Secondary | ICD-10-CM

## 2021-04-14 MED ORDER — TADALAFIL 5 MG PO TABS: 5.0000 mg | ORAL_TABLET | Freq: Every evening | ORAL | 3 refills | Status: DC

## 2021-04-18 ENCOUNTER — Ambulatory Visit: Payer: PPO | Admitting: Urology

## 2021-04-18 DIAGNOSIS — N138 Other obstructive and reflux uropathy: Secondary | ICD-10-CM

## 2021-04-18 DIAGNOSIS — N3281 Overactive bladder: Secondary | ICD-10-CM

## 2021-04-24 DIAGNOSIS — F4312 Post-traumatic stress disorder, chronic: Secondary | ICD-10-CM | POA: Diagnosis not present

## 2021-05-02 ENCOUNTER — Telehealth: Payer: Self-pay | Admitting: Cardiovascular Disease

## 2021-05-02 NOTE — Telephone Encounter (Signed)
*  STAT* If patient is at the pharmacy, call can be transferred to refill team.   1. Which medications need to be refilled? (please list name of each medication and dose if known)  simvastatin (ZOCOR) 40 MG tablet  2. Which pharmacy/location (including street and city if local pharmacy) is medication to be sent to? [facility] - [place], [place]  3. Do they need a 30 day or 90 day supply? 90 with refills

## 2021-05-03 MED ORDER — SIMVASTATIN 40 MG PO TABS: 40.0000 mg | ORAL_TABLET | Freq: Every day | ORAL | 3 refills | Status: DC

## 2021-05-03 NOTE — Telephone Encounter (Signed)
Medication sent electronically 

## 2021-06-08 DIAGNOSIS — X32XXXD Exposure to sunlight, subsequent encounter: Secondary | ICD-10-CM | POA: Diagnosis not present

## 2021-06-08 DIAGNOSIS — L570 Actinic keratosis: Secondary | ICD-10-CM | POA: Diagnosis not present

## 2021-06-08 DIAGNOSIS — D225 Melanocytic nevi of trunk: Secondary | ICD-10-CM | POA: Diagnosis not present

## 2021-06-08 DIAGNOSIS — R208 Other disturbances of skin sensation: Secondary | ICD-10-CM | POA: Diagnosis not present

## 2021-06-14 ENCOUNTER — Other Ambulatory Visit: Payer: Self-pay

## 2021-06-14 ENCOUNTER — Telehealth: Payer: Self-pay

## 2021-06-14 DIAGNOSIS — N3281 Overactive bladder: Secondary | ICD-10-CM

## 2021-06-14 NOTE — Telephone Encounter (Signed)
Pt called and left vm regarding getting a refill on oxybutynin. Pt is under the impression it should have been be called in.

## 2021-06-15 ENCOUNTER — Other Ambulatory Visit: Payer: Self-pay | Admitting: Urology

## 2021-06-15 DIAGNOSIS — N3281 Overactive bladder: Secondary | ICD-10-CM

## 2021-06-15 MED ORDER — OXYBUTYNIN CHLORIDE 5 MG PO TABS: 5.0000 mg | ORAL_TABLET | Freq: Every day | ORAL | 11 refills | Status: DC

## 2021-06-16 ENCOUNTER — Telehealth: Payer: Self-pay

## 2021-06-16 NOTE — Telephone Encounter (Signed)
Patient called and advised medication needs to be sent to his mail order pharmacy   Medication: oxybutynin (DITROPAN) 5 MG tablet  Pharmacy: [facility] ([place], [place]  Thank you!

## 2021-06-19 ENCOUNTER — Other Ambulatory Visit: Payer: Self-pay

## 2021-06-19 DIAGNOSIS — N3281 Overactive bladder: Secondary | ICD-10-CM

## 2021-06-19 MED ORDER — OXYBUTYNIN CHLORIDE 5 MG PO TABS: 5.0000 mg | ORAL_TABLET | Freq: Every day | ORAL | 3 refills | Status: DC

## 2021-06-19 NOTE — Telephone Encounter (Signed)
Rx sent. Patient made aware.

## 2021-06-26 DIAGNOSIS — F4312 Post-traumatic stress disorder, chronic: Secondary | ICD-10-CM | POA: Diagnosis not present

## 2021-06-27 ENCOUNTER — Ambulatory Visit: Payer: PPO | Admitting: Gastroenterology

## 2021-06-27 ENCOUNTER — Encounter: Payer: Self-pay | Admitting: Gastroenterology

## 2021-06-27 ENCOUNTER — Other Ambulatory Visit: Payer: Self-pay

## 2021-06-27 VITALS — BP 125/77 | HR 67 | Temp 97.3°F | Ht 70.0 in | Wt 237.2 lb

## 2021-06-27 DIAGNOSIS — K648 Other hemorrhoids: Secondary | ICD-10-CM

## 2021-06-27 DIAGNOSIS — K219 Gastro-esophageal reflux disease without esophagitis: Secondary | ICD-10-CM

## 2021-06-27 NOTE — Progress Notes (Signed)
Referring Provider: Redmond School, MD Primary Care Physician:  Redmond School, MD Primary GI: Dr. Gala Romney    Chief Complaint  Patient presents with   Hemorrhoids    Very little bleeding from skin at the anus per pt.    HPI:   Casey Castillo is a 76 y.o. male presenting today with a history of symptomatic hemorrhoids s/p banding in 2021 and neutrally in early 2022, colonoscopy March 2021 with numerous polyps and one large 25 mm polyp removed via piecemeal fashion, and early interval colonoscopy Sept 2021: two 2-4 mm polyps in rectum and cecum, diverticulosis in sigmoid and descending colon. Minimal hemorrhoids remaining. Tubular adenomas. Surveillance in 3 years (2024).    Pantoprazole BID. GERD controlled. Has never tried to wean this down. No dysphagia. No abdominal pain. No melena. He has very scant tissue hematochezia if wiping hard at rectum. Rare. Straining has happened but is also rare. Overall, feels like he has good BMs.   Past Medical History:  Diagnosis Date   Arthritis    Cervical myelopathy with cervical radiculopathy (HCC)    Claustrophobia    Coronary artery disease    5%blockage on left anterior descending Dr. Sallyanne Kuster    Diverticulitis    hx of   Enlarged prostate    takes flomax   GERD (gastroesophageal reflux disease)    History of blood clots    november, 5 1995- left leg; 2014 - left calf   History of kidney stones    Hyperlipidemia    primary Dr. Selena Batten   Pneumonia    as a child  24 YEARS OLD   PTSD (post-traumatic stress disorder)    PTSD (post-traumatic stress disorder)    Seasonal allergies    Urinary frequency    hx of     Past Surgical History:  Procedure Laterality Date   ANTERIOR CERVICAL DECOMP/DISCECTOMY FUSION  01/28/2012   Procedure: ANTERIOR CERVICAL DECOMPRESSION/DISCECTOMY FUSION 2 LEVELS;  Surgeon: Kristeen Miss, MD;  Location: [facility];  Service: Neurosurgery;  Laterality: N/A;  Cervical six-seven, cervical  seven-thoracic one Anterior cervical decompression/diskectomy/fusion   ANTERIOR CERVICAL DECOMP/DISCECTOMY FUSION N/A 12/24/2017   Procedure: ANTERIOR CERVICAL DECOMPRESSION/DISCECTOMY FUSION CERVICAL FOUR-FIVE/CERVICAL FIVE-SIX TWO LEVELS;  Surgeon: Kristeen Miss, MD;  Location: [facility];  Service: Neurosurgery;  Laterality: N/A;  ANTERIOR CERVICAL DECOMPRESSION/DISCECTOMY FUSION CERVICAL FOUR-FIVE/CERVICAL FIVE-SIX TWO LEVELS   CARDIAC CATHETERIZATION     2006   CARDIOVASCULAR STRESS TEST     2006 and 2008 at [facility] vascular, 2011 myoview and echo   COLONOSCOPY     COLONOSCOPY N/A 08/30/2015   Dr. Arnoldo Morale: one 20 mm polyp in proximal ascending colon, removed piecemeal using hot snare. Resected and retrieved. Diverticulosis in sigmoid colon. Path with tubular adenoma, no high grade dysplasia. 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 Minimal hemorrhoids remain. Tubular adenomas. 3 years.    engrown  toenail Bilateral    EYE SURGERY     Laser for torn  retina   HEMORRHOID SURGERY     sept 8, 2008   KIDNEY STONE SURGERY     lithotripsy 1980's   KNEE SURGERY     left 04-11-94 ( has had two surgeries to left knee)   NASAL SINUS SURGERY     Jun 30 1998   NM MYOVIEW LTD  07/20/2009   no ischemia   PILONIDAL CYST / SINUS EXCISION     surgery 01-08-74   POLYPECTOMY  08/26/2019   Procedure: POLYPECTOMY;  Surgeon: Daneil Dolin, MD;  Location: [facility];   Service: Endoscopy;;   POLYPECTOMY  02/25/2020   Procedure: POLYPECTOMY;  Surgeon: Daneil Dolin, MD;  Location: [facility];  Service: Endoscopy;;   RADIOLOGY WITH ANESTHESIA N/A 06/12/2018   Procedure: MRI OF CHEST WITH AND WITHOUT CONTRAST;  Surgeon: Radiologist, Medication, MD;  Location: [facility];  Service: Radiology;  Laterality: N/A;   SHOULDER SURGERY     right 12-20-2003   TOTAL KNEE ARTHROPLASTY Left 09/15/2019   Procedure: TOTAL KNEE ARTHROPLASTY;  Surgeon: Renette Butters, MD;  Location: [facility];  Service: Orthopedics;  Laterality: Left;   TRIGGER FINGER RELEASE     03-21-98 right hand ring finger   US ECHOCARDIOGRAPHY  07/20/2009   LA & RA mildly dilated,trace MR,TR.    Current Outpatient Medications  Medication Sig Dispense Refill   acetaminophen (TYLENOL) 650 MG CR tablet Take 650-1,300 mg by mouth as needed for pain.     amoxicillin (AMOXIL) 500 MG capsule Take 2,000 mg by mouth See admin instructions. Take 4 capsules (2000 mg) by mouth 1 hour prior to dental appointment     aspirin EC 81 MG tablet Take 81 mg by mouth daily. Swallow whole.     Biotin 5000 MCG TABS Take 5,000 mcg by mouth daily.      calcium elemental as carbonate (BARIATRIC TUMS ULTRA) 400 MG chewable tablet Chew 2,000-3,000 mg by mouth daily as needed for heartburn.     Cholecalciferol (VITAMIN D) 2000 UNITS CAPS Take 2,000 Units by mouth at bedtime.      clobetasol cream (TEMOVATE) 1.93 % Apply 1 application topically 2 (two) times daily as needed (itching/dry skin.).      diphenhydrAMINE (BENADRYL) 50 MG tablet Take 50 mg by mouth at bedtime as needed for sleep (sleep.).      Emollient (AQUAPHOR EX) Apply 1 application topically 3 (three) times daily as needed (severe dry skin.).     Hydrocortisone, Perianal, (PROCTO-PAK) 1 % CREA Apply 1 application topically in the morning and at bedtime. As needed to external hemorrhoid 28 g 1   loratadine (CLARITIN) 10 MG tablet Take 10 mg by mouth daily.      Lysine HCl  500 MG TABS Take 500 mg by mouth at bedtime.      Melatonin 5 MG TABS Take 5 mg by mouth at bedtime as needed (sleep).     Omega-3 Fatty Acids (FISH OIL) 1200 MG CAPS Take 1,200 mg by mouth at bedtime.     oxybutynin (DITROPAN) 5 MG tablet Take 1 tablet (5 mg total) by mouth daily. 90 tablet 3   pantoprazole (PROTONIX) 40 MG tablet Take 40 mg by mouth 2 (two) times daily.  2   simvastatin (ZOCOR) 40 MG tablet Take 1 tablet (40 mg total) by mouth daily. 90 tablet 3   tadalafil (CIALIS) 5 MG tablet Take 1 tablet (5 mg total) by mouth every evening. 90 tablet 3   tamsulosin (FLOMAX) 0.4  MG CAPS capsule Take 1 capsule (0.4 mg total) by mouth 2 (two) times daily. 180 capsule 3   valACYclovir (VALTREX) 1000 MG tablet Take 500 mg by mouth daily as needed (fever blisters.).      No current facility-administered medications for this visit.    Allergies as of 06/27/2021 - Review Complete 06/27/2021  Allergen Reaction Noted   Adhesive [tape] Hives, Rash, and Other (See Comments) 05/26/2013    Family History  Problem Relation Age of Onset   Heart attack Father    Dementia Mother    Heart disease Other    Colon cancer Neg Hx     Social History   Socioeconomic History   Marital status: Widowed    Spouse name: Not on file   Number of children: Not on file   Years of education: Not on file   Highest education level: Not on file  Occupational History   Not on file  Tobacco Use   Smoking status: Former    Packs/day: 1.00    Years: 20.00    Pack years: 20.00    Types: Cigarettes    Quit date: 24    Years since quitting: 51.0   Smokeless tobacco: Never   Tobacco comments:    quit smokin cigarettes at age 44  Vaping Use   Vaping Use: Never used  Substance and Sexual Activity   Alcohol use: No   Drug use: No   Sexual activity: Not Currently  Other Topics Concern   Not on file  Social History Narrative   Not on file   Social Determinants of Health   Financial Resource Strain:  Not on file  Food Insecurity: Not on file  Transportation Needs: Not on file  Physical Activity: Not on file  Stress: Not on file  Social Connections: Not on file    Review of Systems: Gen: Denies fever, chills, anorexia. Denies fatigue, weakness, weight loss.  CV: Denies chest pain, palpitations, syncope, peripheral edema, and claudication. Resp: Denies dyspnea at rest, cough, wheezing, coughing up blood, and pleurisy. GI: see HPI Derm: Denies rash, itching, dry skin Psych: Denies depression, anxiety, memory loss, confusion. No homicidal or suicidal ideation.  Heme: Denies bruising, bleeding, and enlarged lymph nodes.  Physical Exam: BP 125/77    Pulse 67    Temp (!) 97.3 F (36.3 C)    Ht 5\' 10"  (1.778 m)    Wt 237 lb 3.2 oz (107.6 kg)    BMI 34.03 kg/m  General:   Alert and oriented. No distress noted. Pleasant and cooperative.  Head:  Normocephalic and atraumatic. Eyes:  Conjuctiva clear without scleral icterus. Mouth:  mask in place Abdomen:  +BS, soft, non-tender and non-distended. No rebound or guarding. No HSM. Ventral hernia/umbilical hernia.  Msk:  Symmetrical without gross deformities. Normal posture. Extremities:  Without edema. Neurologic:  Alert and  oriented x4 Psych:  Alert and cooperative. Normal mood and affect.  ASSESSMENT/PLAN: Casey Castillo is a 76 y.o. male presenting today with a history of symptomatic hemorrhoids s/p banding in 2021 and neutrally in early 2022, colonoscopy March 2021 with numerous polyps and one large 25 mm polyp removed via piecemeal fashion, and early interval colonoscopy Sept 2021: two 2-4 mm polyps in rectum and cecum, diverticulosis in sigmoid and descending colon. Minimal hemorrhoids remaining. Tubular adenomas. Surveillance in 3 years (2024).    Overall, he has had improvement with banding. Very rare paper hematochezia.   GERD controlled at this time, but he is on  BID dosing PPI. I have asked him to decrease this to once per day  to see if this is tolerated.    As he is doing well, we will see him back in 1 year. Colonoscopy due in 2024. I have also asked him to add fiber daily.    Annitta Needs, PhD, ANP-BC [facility]

## 2021-06-27 NOTE — Patient Instructions (Signed)
Please call if any other concerns regarding hemorrhoids.  Make sure to avoid straining. I recommend Benefiber 2 teaspoons daily. This is tasteless and can be mixed in the beverage of your choice!  I recommend decreasing pantoprazole (Protonix) to once daily if you can tolerate it.  We will see you back in 1 year!  I enjoyed seeing you again today! As you know, I value our relationship and want to provide genuine, compassionate, and quality care. I welcome your feedback. If you receive a survey regarding your visit,  I greatly appreciate you taking time to fill this out. See you next time!  Annitta Needs, PhD, ANP-BC [facility]

## 2021-07-03 DIAGNOSIS — L820 Inflamed seborrheic keratosis: Secondary | ICD-10-CM | POA: Diagnosis not present

## 2021-08-10 ENCOUNTER — Telehealth: Payer: Self-pay | Admitting: Cardiovascular Disease

## 2021-08-10 NOTE — Telephone Encounter (Signed)
Please order lower extremity venous Doppler of the left leg for possible DVT ?

## 2021-08-10 NOTE — Telephone Encounter (Signed)
Patient reports sharp pain on the outside of his left leg half way down the leg. His left foot and ankle are more edematous than the right. The pain woke him the other night and stated, "It stuck like a knife for a couple of seconds then was gone." He denies chest pain and sob. He would like to have a doppler study on left leg. Recommended that if he has chest pain or sob to go to the ED. He voiced understanding and said he would go. ?

## 2021-08-10 NOTE — Telephone Encounter (Signed)
Patient states for the past week he has been experiencing a sharp pain in his left leg. He states sometimes it occurs about 4-5 times/day, but only lasts for a few seconds - mainly occurs at night. He has concerns because he has had a hx clots in his leg after having knee surgery. Patient denies having any additional symptoms. ?

## 2021-08-11 ENCOUNTER — Other Ambulatory Visit: Payer: Self-pay

## 2021-08-11 ENCOUNTER — Telehealth: Payer: Self-pay | Admitting: Cardiovascular Disease

## 2021-08-11 ENCOUNTER — Ambulatory Visit (HOSPITAL_COMMUNITY)
Admission: RE | Admit: 2021-08-11 | Discharge: 2021-08-11 | Disposition: A | Discharge status: Home / Self Care | Payer: PPO | Source: Ambulatory Visit | Attending: Cardiovascular Disease | Admitting: Cardiovascular Disease

## 2021-08-11 DIAGNOSIS — Z86718 Personal history of other venous thrombosis and embolism: Secondary | ICD-10-CM | POA: Diagnosis not present

## 2021-08-11 DIAGNOSIS — M79662 Pain in left lower leg: Secondary | ICD-10-CM | POA: Diagnosis not present

## 2021-08-11 NOTE — Telephone Encounter (Signed)
Warm compress, keeping the leg elevated, acetaminophen is the best idea.  If he needs to take ibuprofen sparingly (no more than once a day) he can do that, too. ?

## 2021-08-11 NOTE — Telephone Encounter (Signed)
Patient called back to say that is not a blood clot its inflammation and he is wondering if Dr. Sallyanne Kuster can call him in something. ?

## 2021-08-11 NOTE — Telephone Encounter (Signed)
Informed patient that a scheduler will call him to scheduled the Doppler of LLE for possible DVT. Pt stated he had a good night and wasn't bothered by his leg too much. ?

## 2021-08-11 NOTE — Telephone Encounter (Signed)
Patient wants something to take for "inflammation in my leg". He wants to take ibuprofen, but I recommended the he not take ibuprofen because he takes aspirin and both could cause bleeding. I suggested warm compress. Please advise. ?

## 2021-08-11 NOTE — Telephone Encounter (Signed)
Informed patient what Dr. Sallyanne Kuster recommends for leg inflammation.  "Warm compress, keeping the leg elevated, acetaminophen is the best idea.  If he needs to take ibuprofen sparingly (no more than once a day) he can do that, too." ?Patient asked dose of ibuprofen, I told him 200 mg. Advised patient to let us know if he has any bleeding. Pt voiced understanding of this conversation. ?

## 2021-08-21 DIAGNOSIS — F4312 Post-traumatic stress disorder, chronic: Secondary | ICD-10-CM | POA: Diagnosis not present

## 2021-08-31 DIAGNOSIS — Z6834 Body mass index (BMI) 34.0-34.9, adult: Secondary | ICD-10-CM | POA: Diagnosis not present

## 2021-08-31 DIAGNOSIS — J069 Acute upper respiratory infection, unspecified: Secondary | ICD-10-CM | POA: Diagnosis not present

## 2021-08-31 DIAGNOSIS — E6609 Other obesity due to excess calories: Secondary | ICD-10-CM | POA: Diagnosis not present

## 2021-10-23 DIAGNOSIS — F4312 Post-traumatic stress disorder, chronic: Secondary | ICD-10-CM | POA: Diagnosis not present

## 2021-12-21 DIAGNOSIS — F4312 Post-traumatic stress disorder, chronic: Secondary | ICD-10-CM | POA: Diagnosis not present

## 2022-01-19 ENCOUNTER — Ambulatory Visit: Payer: PPO | Admitting: Cardiovascular Disease

## 2022-01-19 ENCOUNTER — Encounter: Payer: Self-pay | Admitting: Cardiovascular Disease

## 2022-01-19 VITALS — BP 120/64 | HR 75 | Ht 70.0 in | Wt 230.6 lb

## 2022-01-19 DIAGNOSIS — E669 Obesity, unspecified: Secondary | ICD-10-CM | POA: Diagnosis not present

## 2022-01-19 DIAGNOSIS — Z86718 Personal history of other venous thrombosis and embolism: Secondary | ICD-10-CM | POA: Diagnosis not present

## 2022-01-19 DIAGNOSIS — I2510 Atherosclerotic heart disease of native coronary artery without angina pectoris: Secondary | ICD-10-CM

## 2022-01-19 DIAGNOSIS — E782 Mixed hyperlipidemia: Secondary | ICD-10-CM | POA: Diagnosis not present

## 2022-01-19 DIAGNOSIS — R7303 Prediabetes: Secondary | ICD-10-CM

## 2022-01-19 NOTE — Patient Instructions (Signed)

## 2022-01-19 NOTE — Progress Notes (Signed)
Patient ID: Casey Castillo, male   DOB: 1945-11-12, 76 y.o.   MRN: [id number]    Cardiology Office Note    Date:  01/19/2022   ID:  Casey Castillo, DOB 1945-11-07, MRN [id number]  PCP:  Casey School, MD  Cardiologist:   Casey Klein, MD   Chief Complaint  Patient presents with   Coronary Artery Disease    History of Present Illness:  Casey Castillo is a 76 y.o. male with a history of remote DVT of the lower extremity, no longer on anticoagulation, coronary atherosclerosis without severe stenoses (remote coronary angiography) and hyperlipidemia.  In March, Casey Castillo had some left calf discomfort that occurred intermittently, which worried him for possible DVT of the leg, but duplex ultrasound was normal.  Ever since his left knee replacement surgery he has had some additional swelling in that calf, but it is almost back to baseline now.  I do not have his most recent labs from the [place] available for review, but he reports that his glycemic control has been very good with a hemoglobin A1c around 6%.  Labs were performed by Casey Castillo in February and his triglycerides are borderline high, but his other of lipid parameters were normal.  He continues to be very physically active.  He enjoys shag dancing and does this at least once a week.  He works a lot outside in his yard.  Denies any angina or dyspnea with physical activity.  Has not had palpitations, dizziness, syncope, falls, bleeding problems, claudication or focal neurological events.   Past Medical History:  Diagnosis Date   Arthritis    Cervical myelopathy with cervical radiculopathy (HCC)    Claustrophobia    Coronary artery disease    5%blockage on left anterior descending Casey Castillo    Diverticulitis    hx of   Enlarged prostate    takes flomax   GERD (gastroesophageal reflux disease)    History of blood clots    november, 5 1995- left leg; 2014 - left calf   History of kidney stones    Hyperlipidemia    primary Casey Castillo   Pneumonia    as a child  52 YEARS OLD   PTSD (post-traumatic stress disorder)    PTSD (post-traumatic stress disorder)    Seasonal allergies    Urinary frequency    hx of     Past Surgical History:  Procedure Laterality Date   ANTERIOR CERVICAL DECOMP/DISCECTOMY FUSION  01/28/2012   Procedure: ANTERIOR CERVICAL DECOMPRESSION/DISCECTOMY FUSION 2 LEVELS;  Surgeon: Casey Miss, MD;  [facility];  Service: Neurosurgery;  Laterality: N/A;  Cervical six-seven, cervical seven-thoracic one Anterior cervical decompression/diskectomy/fusion   ANTERIOR CERVICAL DECOMP/DISCECTOMY FUSION N/A 12/24/2017   Procedure: ANTERIOR CERVICAL DECOMPRESSION/DISCECTOMY FUSION CERVICAL FOUR-FIVE/CERVICAL FIVE-SIX TWO LEVELS;  Surgeon: Casey Miss, MD;  Location: [place];  Service: Neurosurgery;  Laterality: N/A;  ANTERIOR CERVICAL DECOMPRESSION/DISCECTOMY FUSION CERVICAL FOUR-FIVE/CERVICAL FIVE-SIX TWO LEVELS   CARDIAC CATHETERIZATION     2006   CARDIOVASCULAR STRESS TEST     2006 and 2008 at [facility] heart [facility] vascular, 2011 myoview and echo   COLONOSCOPY     COLONOSCOPY N/A 08/30/2015   Dr. Arnoldo Castillo: one 20 mm polyp in proximal ascending colon, removed piecemeal using hot snare. Resected and retrieved. Diverticulosis in sigmoid colon. Path with tubular adenoma, no high grade dysplasia. 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 edge   COLONOSCOPY N/A 08/26/2019   sigmoid and descending colon diverticulosis, seven  3-7 mm polyps in cecum, (tubular adenomas and sessile serrated) four 4-6 mm polyps in ascending colon (tubular adenomas), two 4-6 mm polyps in sigmoid and splenic flexure (tubular adenomas), one 25 mm polyp in ascending colon (tubular adenoma) removed via piecemeal fashion   COLONOSCOPY WITH PROPOFOL N/A 02/25/2020   Colonoscopy Sept 2021: two 2-4 mm polyps in rectum and cecum, diverticulosis in sigmoid and descending colon. Minimal hemorrhoids remain. Tubular adenomas.  3 years.    engrown  toenail Bilateral    EYE SURGERY     Laser for torn retina   HEMORRHOID SURGERY     sept 8, 2008   KIDNEY STONE SURGERY     lithotripsy 1980's   KNEE SURGERY     left 04-11-94 ( has had two surgeries to left knee)   NASAL SINUS SURGERY     Jun 30 1998   NM MYOVIEW LTD  07/20/2009   no ischemia   PILONIDAL CYST / SINUS EXCISION     surgery 01-08-74   POLYPECTOMY  08/26/2019   Procedure: POLYPECTOMY;  Surgeon: Casey Dolin, MD;  [facility];  Service: Endoscopy;;   POLYPECTOMY  02/25/2020   Procedure: POLYPECTOMY;  Surgeon: Casey Dolin, MD;  [facility];  Service: Endoscopy;;   RADIOLOGY WITH ANESTHESIA N/A 06/12/2018   Procedure: MRI OF CHEST WITH AND WITHOUT CONTRAST;  Surgeon: Casey Castillo, Medication, MD;  Location: [place];  Service: Radiology;  Laterality: N/A;   SHOULDER SURGERY     right 12-20-2003   TOTAL KNEE ARTHROPLASTY Left 09/15/2019   Procedure: TOTAL KNEE ARTHROPLASTY;  Surgeon: Casey Butters, MD;  [facility];  Service: Orthopedics;  Laterality: Left;   TRIGGER FINGER RELEASE     03-21-98 right hand ring finger   US ECHOCARDIOGRAPHY  07/20/2009   LA & RA mildly dilated,trace MR,TR.    Current Outpatient Medications  Medication Sig Dispense Refill   aspirin EC 81 MG tablet Take 81 mg by mouth daily. Swallow whole.     Biotin 5000 MCG TABS Take 5,000 mcg by mouth daily.      Cholecalciferol (VITAMIN D) 2000 UNITS CAPS Take 2,000 Units by mouth at bedtime.      Emollient (AQUAPHOR EX) Apply 1 application topically 3 (three) times daily as needed (severe dry skin.).     loratadine (CLARITIN) 10 MG tablet Take 10 mg by mouth daily.      Lysine HCl 500 MG TABS Take 500 mg by mouth at bedtime.      Melatonin 5 MG TABS Take 5 mg by mouth at bedtime as needed (sleep).     Omega-3 Fatty Acids (FISH OIL) 1200 MG CAPS Take 1,200 mg by mouth at bedtime.     oxybutynin (DITROPAN) 5 MG tablet Take 1 tablet (5 mg total) by mouth  daily. 90 tablet 3   pantoprazole (PROTONIX) 40 MG tablet Take 40 mg by mouth 2 (two) times daily.  2   simvastatin (ZOCOR) 40 MG tablet Take 1 tablet (40 mg total) by mouth daily. 90 tablet 3   tadalafil (CIALIS) 5 MG tablet Take 1 tablet (5 mg total) by mouth every evening. 90 tablet 3   tamsulosin (FLOMAX) 0.4 MG CAPS capsule Take 1 capsule (0.4 mg total) by mouth 2 (two) times daily. 180 capsule 3   acetaminophen (TYLENOL) 650 MG CR tablet Take 650-1,300 mg by mouth as needed for pain. (Patient not taking: Reported on 01/19/2022)     amoxicillin (AMOXIL) 500 MG  capsule Take 2,000 mg by mouth See admin instructions. Take 4 capsules (2000 mg) by mouth 1 hour prior to dental appointment (Patient not taking: Reported on 01/19/2022)     calcium elemental as carbonate (BARIATRIC TUMS ULTRA) 400 MG chewable tablet Chew 2,000-3,000 mg by mouth daily as needed for heartburn. (Patient not taking: Reported on 01/19/2022)     clobetasol cream (TEMOVATE) 1.44 % Apply 1 application topically 2 (two) times daily as needed (itching/dry skin.).  (Patient not taking: Reported on 01/19/2022)     diphenhydrAMINE (BENADRYL) 50 MG tablet Take 50 mg by mouth at bedtime as needed for sleep (sleep.).  (Patient not taking: Reported on 01/19/2022)     Hydrocortisone, Perianal, (PROCTO-PAK) 1 % CREA Apply 1 application topically in the morning and at bedtime. As needed to external hemorrhoid (Patient not taking: Reported on 01/19/2022) 28 g 1   valACYclovir (VALTREX) 1000 MG tablet Take 500 mg by mouth daily as needed (fever blisters.).  (Patient not taking: Reported on 01/19/2022)     No current facility-administered medications for this visit.    Allergies:   Adhesive [tape]   Social History   Socioeconomic History   Marital status: Widowed    Spouse name: Not on file   Number of children: Not on file   Years of education: Not on file   Highest education level: Not on file  Occupational History   Not on file  Tobacco  Use   Smoking status: Former    Packs/day: 1.00    Years: 20.00    Total pack years: 20.00    Types: Cigarettes    Quit date: 18    Years since quitting: 51.6   Smokeless tobacco: Never   Tobacco comments:    quit smokin cigarettes at age 44  Vaping Use   Vaping Use: Never used  Substance and Sexual Activity   Alcohol use: No   Drug use: No   Sexual activity: Not Currently  Other Topics Concern   Not on file  Social History Narrative   Not on file   Social Determinants of Health   Financial Resource Strain: Not on file  Food Insecurity: Not on file  Transportation Needs: Not on file  Physical Activity: Not on file  Stress: Not on file  Social Connections: Not on file     Family History:  The patient's family history includes Dementia in his mother; Heart attack in his father; Heart disease in an other family member.   ROS:   Please see the history of present illness.    Review of Systems  Musculoskeletal:  Positive for arthritis, joint pain and joint swelling.   All other systems are reviewed and are negative.   PHYSICAL EXAM:   VS:  BP 120/64 (BP Location: Left Arm, Patient Position: Sitting, Cuff Size: Normal)   Pulse 75   Ht '5\' 10"'$  (1.778 m)   Wt 230 lb 9.6 oz (104.6 kg)   SpO2 96%   BMI 33.09 kg/m       General: Alert, oriented x3, no distress, mildly obese Head: no evidence of trauma, PERRL, EOMI, no exophtalmos or lid lag, no myxedema, no xanthelasma; normal ears, nose and oropharynx Neck: normal jugular venous pulsations and no hepatojugular reflux; brisk carotid pulses without delay and no carotid bruits Chest: clear to auscultation, no signs of consolidation by percussion or palpation, normal fremitus, symmetrical and full respiratory excursions Cardiovascular: normal position and quality of the apical impulse, regular rhythm, normal first and  second heart sounds, no murmurs, rubs or gallops Abdomen: no tenderness or distention, no masses by  palpation, no abnormal pulsatility or arterial bruits, normal bowel sounds, no hepatosplenomegaly Extremities: no clubbing, cyanosis or edema; 2+ radial, ulnar and brachial pulses bilaterally; 2+ right femoral, posterior tibial and dorsalis pedis pulses; 2+ left femoral, posterior tibial and dorsalis pedis pulses; no subclavian or femoral bruits Neurological: grossly nonfocal Psych: Normal mood and affect     Wt Readings from Last 3 Encounters:  01/19/22 230 lb 9.6 oz (104.6 kg)  06/27/21 237 lb 3.2 oz (107.6 kg)  12/15/20 234 lb (106.1 kg)      Studies/Labs Reviewed:   EKG:  EKG ordered today personally reviewed shows normal sinus rhythm, questionable left atrial abnormality, otherwise normal tracing.  No ischemic abnormalities are seen.  QTc 410 ms. Recent Labs:  09/04/2019 hemoglobin 14.3, creatinine 1.08, potassium 4.2  Lipid Panel 07/31/2019 total cholesterol 149, HDL 46, LDL 85, triglycerides 97  02/11/2020 [facility] [place] Glucose 101, potassium 4.5, creatinine 1.18 (GFR 60), hemoglobin 14.3, hemoglobin A1c 6.3%, normal liver function test, TSH 1.63.  Lipid profile not submitted. ASSESSMENT:    1. Coronary artery disease involving native coronary artery of native heart without angina pectoris   2. Mixed hyperlipidemia   3. Prediabetes   4. Mild obesity   5. History of deep vein thrombosis (DVT) of lower extremity      PLAN:  In order of problems listed above:    CAD: Minor nonobstructive disease at previous heart catheterization, several normal nuclear stress tests over the years.  Currently asymptomatic.  Continue with risk factor modification. HLP/ prediabetes: Has labs checked at the [facility] and by his primary care physician in [place] every September and every February respectively.  I need to get the most recent set of labs from him.  Does not require medicine to maintain glycemic control at this point. Obesity: Reviewed importance of avoiding excess  carbohydrates in his diet (he has a weakness for sweets and [place] fries).  He is lost about 4 pounds since last year.  Discussed the diet higher in unsaturated fat and lean protein and low in carbs. Hx of DVT: Negative ultrasound in March of this year.  Remote, no recurrence, no longer on full anticoagulation.  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  Medication Adjustments/Labs and Tests Ordered: Current medicines are reviewed at length with the patient today.  Concerns regarding medicines are outlined above.  Medication changes, Labs and Tests ordered today are listed below. Patient Instructions  Medication Instructions:  No changes *If you need a refill on your cardiac medications before your next appointment, please call your pharmacy*   Lab Work: None ordered If you have labs (blood work) drawn today and your tests are completely normal, you will receive your results only by: [place] (if you have MyChart) OR A paper copy in the mail If you have any lab test that is abnormal or we need to change your treatment, we will call you to review the results.   Testing/Procedures: None ordered   Follow-Up: At [facility], you and your health needs are our priority.  As part of our continuing mission to provide you with exceptional heart care, we have created designated Provider Care Teams.  These Care Teams include your primary Cardiologist (physician) and Advanced Practice Providers (APPs -  Physician Assistants and Nurse Practitioners) who all work together  to provide you with the care you need, when you need it.  We recommend signing up for the patient portal called "MyChart".  Sign up information is provided on this After Visit Summary.  MyChart is used to connect with patients for Virtual Visits (Telemedicine).  Patients are able to view lab/test results, encounter notes, upcoming  appointments, etc.  Non-urgent messages can be sent to your provider as well.   To learn more about what you can do with MyChart, go to [email or web address].    Your next appointment:   12 month(s)  The format for your next appointment:   In Person  Provider:   Sanda Klein, MD {   Important Information About Sugar        Signed, Casey Klein, MD  01/19/2022 9:47 AM    [address], [place], [place]  [address] Phone: [phone]; Fax: [phone]

## 2022-02-14 ENCOUNTER — Ambulatory Visit: Payer: PPO | Admitting: Cardiovascular Disease

## 2022-03-05 DIAGNOSIS — F4312 Post-traumatic stress disorder, chronic: Secondary | ICD-10-CM | POA: Diagnosis not present

## 2022-03-12 ENCOUNTER — Other Ambulatory Visit: Payer: Self-pay

## 2022-03-12 MED ORDER — SIMVASTATIN 40 MG PO TABS: 40.0000 mg | ORAL_TABLET | Freq: Every day | ORAL | 2 refills | Status: DC

## 2022-03-19 DIAGNOSIS — L570 Actinic keratosis: Secondary | ICD-10-CM | POA: Diagnosis not present

## 2022-03-19 DIAGNOSIS — D225 Melanocytic nevi of trunk: Secondary | ICD-10-CM | POA: Diagnosis not present

## 2022-03-19 DIAGNOSIS — L820 Inflamed seborrheic keratosis: Secondary | ICD-10-CM | POA: Diagnosis not present

## 2022-03-19 DIAGNOSIS — B078 Other viral warts: Secondary | ICD-10-CM | POA: Diagnosis not present

## 2022-03-19 DIAGNOSIS — X32XXXD Exposure to sunlight, subsequent encounter: Secondary | ICD-10-CM | POA: Diagnosis not present

## 2022-04-02 DIAGNOSIS — D518 Other vitamin B12 deficiency anemias: Secondary | ICD-10-CM | POA: Diagnosis not present

## 2022-04-02 DIAGNOSIS — Z125 Encounter for screening for malignant neoplasm of prostate: Secondary | ICD-10-CM | POA: Diagnosis not present

## 2022-04-02 DIAGNOSIS — Z0001 Encounter for general adult medical examination with abnormal findings: Secondary | ICD-10-CM | POA: Diagnosis not present

## 2022-04-02 DIAGNOSIS — Z6834 Body mass index (BMI) 34.0-34.9, adult: Secondary | ICD-10-CM | POA: Diagnosis not present

## 2022-04-02 DIAGNOSIS — E039 Hypothyroidism, unspecified: Secondary | ICD-10-CM | POA: Diagnosis not present

## 2022-04-02 DIAGNOSIS — E782 Mixed hyperlipidemia: Secondary | ICD-10-CM | POA: Diagnosis not present

## 2022-04-02 DIAGNOSIS — E6609 Other obesity due to excess calories: Secondary | ICD-10-CM | POA: Diagnosis not present

## 2022-04-02 DIAGNOSIS — I10 Essential (primary) hypertension: Secondary | ICD-10-CM | POA: Diagnosis not present

## 2022-04-02 DIAGNOSIS — N401 Enlarged prostate with lower urinary tract symptoms: Secondary | ICD-10-CM | POA: Diagnosis not present

## 2022-04-02 DIAGNOSIS — Z1331 Encounter for screening for depression: Secondary | ICD-10-CM | POA: Diagnosis not present

## 2022-04-02 DIAGNOSIS — E559 Vitamin D deficiency, unspecified: Secondary | ICD-10-CM | POA: Diagnosis not present

## 2022-04-17 ENCOUNTER — Encounter: Payer: Self-pay | Admitting: Urology

## 2022-04-17 ENCOUNTER — Ambulatory Visit: Payer: PPO | Admitting: Urology

## 2022-04-17 VITALS — BP 130/55 | HR 84 | Ht 70.0 in | Wt 230.0 lb

## 2022-04-17 DIAGNOSIS — N138 Other obstructive and reflux uropathy: Secondary | ICD-10-CM | POA: Diagnosis not present

## 2022-04-17 DIAGNOSIS — N401 Enlarged prostate with lower urinary tract symptoms: Secondary | ICD-10-CM | POA: Diagnosis not present

## 2022-04-17 DIAGNOSIS — N5201 Erectile dysfunction due to arterial insufficiency: Secondary | ICD-10-CM

## 2022-04-17 NOTE — Progress Notes (Signed)
History of Present Illness: Here for follow-up of BPH and ED.  Urinary symptoms are stable on tamsulosin.  IPSS 7, quality-of-life score 2.  He takes Cialis on an as-needed basis.  He is 76 year old.  He is still getting his PSA checked at his PCP in the [place].  Last PSA recently was 1.1.  Past Medical History:  Diagnosis Date   Arthritis    Cervical myelopathy with cervical radiculopathy (HCC)    Claustrophobia    Coronary artery disease    5%blockage on left anterior descending Dr. Sallyanne Kuster    Diverticulitis    hx of   Enlarged prostate    takes flomax   GERD (gastroesophageal reflux disease)    History of blood clots    november, 5 1995- left leg; 2014 - left calf   History of kidney stones    Hyperlipidemia    primary Dr. Selena Batten   Pneumonia    as a child  40 YEARS OLD   PTSD (post-traumatic stress disorder)    PTSD (post-traumatic stress disorder)    Seasonal allergies    Urinary frequency    hx of     Past Surgical History:  Procedure Laterality Date   ANTERIOR CERVICAL DECOMP/DISCECTOMY FUSION  01/28/2012   Procedure: ANTERIOR CERVICAL DECOMPRESSION/DISCECTOMY FUSION 2 LEVELS;  Surgeon: Kristeen Miss, MD;  [facility];  Service: Neurosurgery;  Laterality: N/A;  Cervical six-seven, cervical seven-thoracic one Anterior cervical decompression/diskectomy/fusion   ANTERIOR CERVICAL DECOMP/DISCECTOMY FUSION N/A 12/24/2017   Procedure: ANTERIOR CERVICAL DECOMPRESSION/DISCECTOMY FUSION CERVICAL FOUR-FIVE/CERVICAL FIVE-SIX TWO LEVELS;  Surgeon: Kristeen Miss, MD;  Location: [place];  Service: Neurosurgery;  Laterality: N/A;  ANTERIOR CERVICAL DECOMPRESSION/DISCECTOMY FUSION CERVICAL FOUR-FIVE/CERVICAL FIVE-SIX TWO LEVELS   CARDIAC CATHETERIZATION     2006   CARDIOVASCULAR STRESS TEST     2006 and 2008 at [facility] heart and vascular, 2011 myoview and echo   COLONOSCOPY     COLONOSCOPY N/A 08/30/2015   Dr. Arnoldo Morale: one 20 mm polyp in proximal ascending colon,  removed piecemeal using hot snare. Resected and retrieved. Diverticulosis in sigmoid colon. Path with tubular adenoma, no high grade dysplasia. 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 Minimal hemorrhoids remain. Tubular adenomas. 3 years.    engrown  toenail Bilateral    EYE SURGERY     Laser for torn retina   HEMORRHOID SURGERY     sept 8, 2008   KIDNEY STONE SURGERY     lithotripsy 1980's   KNEE SURGERY     left 04-11-94 ( has had two surgeries to left knee)   NASAL SINUS SURGERY     Jun 30 1998   NM MYOVIEW LTD  07/20/2009   no ischemia   PILONIDAL CYST / SINUS EXCISION     surgery 01-08-74   POLYPECTOMY  08/26/2019   Procedure: POLYPECTOMY;  Surgeon: Daneil Dolin, MD;  [facility];  Service: Endoscopy;;   POLYPECTOMY  02/25/2020   Procedure: POLYPECTOMY;  Surgeon: Daneil Dolin,  MD;  [facility];  Service: Endoscopy;;   RADIOLOGY WITH ANESTHESIA N/A 06/12/2018   Procedure: MRI OF CHEST WITH AND WITHOUT CONTRAST;  Surgeon: Radiologist, Medication, MD;  Location: [place];  Service: Radiology;  Laterality: N/A;   SHOULDER SURGERY     right 12-20-2003   TOTAL KNEE ARTHROPLASTY Left 09/15/2019   Procedure: TOTAL KNEE ARTHROPLASTY;  Surgeon: Renette Butters, MD;  [facility];  Service: Orthopedics;  Laterality: Left;   TRIGGER FINGER RELEASE     03-21-98 right hand ring finger   US ECHOCARDIOGRAPHY  07/20/2009   LA & RA mildly dilated,trace  MR,TR.    Home Medications:  Allergies as of 04/17/2022       Reactions   Adhesive [tape] Hives, Rash, Other (See Comments)   EKG electrodes cause whelps.  Severe PLEASE WIPE OFF WITH ALCOHOL SWAB.        Medication List        Accurate as of April 17, 2022  2:02 PM. If you have any questions, ask your nurse or doctor.          acetaminophen 650 MG CR tablet Commonly known as: TYLENOL Take 650-1,300 mg by mouth as needed for pain.   amoxicillin 500 MG capsule Commonly known as: AMOXIL Take 2,000 mg by mouth See admin instructions. Take 4 capsules (2000 mg) by mouth 1 hour prior to dental appointment   AQUAPHOR EX Apply 1 application topically 3 (three) times daily as needed (severe dry skin.).   aspirin EC 81 MG tablet Take 81 mg by mouth daily. Swallow whole.   Biotin 5000 MCG Tabs Take 5,000 mcg by mouth daily.   calcium elemental as carbonate 400 MG chewable tablet Commonly known as: BARIATRIC TUMS ULTRA Chew 2,000-3,000 mg by mouth daily as needed for heartburn.   clobetasol cream 0.05 % Commonly known as: TEMOVATE Apply 1 application  topically 2 (two) times daily as needed (itching/dry skin.).   diphenhydrAMINE 50 MG tablet Commonly known as: BENADRYL Take 50 mg by mouth at bedtime as needed for sleep (sleep.).   Fish Oil 1200 MG Caps Take 1,200 mg by mouth at bedtime.   Hydrocortisone (Perianal) 1 % Crea Commonly known as: Procto-Pak Apply 1 application topically in the morning and at bedtime. As needed to external hemorrhoid   loratadine 10 MG tablet Commonly known as: CLARITIN Take 10 mg by mouth daily.   Lysine HCl 500 MG Tabs Take 500 mg by mouth at bedtime.   melatonin 5 MG Tabs Take 5 mg by mouth at bedtime as needed (sleep).   oxybutynin 5 MG tablet Commonly known as: DITROPAN Take 1 tablet (5 mg total) by mouth daily.   pantoprazole 40 MG tablet Commonly known as: PROTONIX Take 40 mg by mouth 2 (two) times daily.    simvastatin 40 MG tablet Commonly known as: ZOCOR Take 1 tablet (40 mg total) by mouth daily.   tadalafil 5 MG tablet Commonly known as: CIALIS Take 1 tablet (5 mg total) by mouth every evening.   tamsulosin 0.4 MG Caps capsule Commonly known as: FLOMAX Take 1 capsule (0.4 mg total) by mouth 2 (two) times daily.   valACYclovir 1000 MG tablet Commonly known as: VALTREX Take 500 mg by mouth daily as needed (fever blisters.).   Vitamin D 50 MCG (2000 UT) Caps Take 2,000 Units by mouth at bedtime.        Allergies:  Allergies  Allergen Reactions   Adhesive [Tape] Hives, Rash and  Other (See Comments)    EKG electrodes cause whelps.  Severe PLEASE WIPE OFF WITH ALCOHOL SWAB.    Family History  Problem Relation Age of Onset   Heart attack Father    Dementia Mother    Heart disease Other    Colon cancer Neg Hx     Social History:  reports that he quit smoking about 51 years ago. His smoking use included cigarettes. He has a 20.00 pack-year smoking history. He has never used smokeless tobacco. He reports that he does not drink alcohol and does not use drugs.  ROS: A complete review of systems was performed.  All systems are negative except for pertinent findings as noted.  Physical Exam:  Vital signs in last 24 hours: BP (!) 130/55   Pulse 84   Ht '5\' 10"'$  (1.778 m)   Wt 230 lb (104.3 kg)   BMI 33.00 kg/m  Constitutional:  Alert and oriented, No acute distress Cardiovascular: Regular rate  Respiratory: Normal respiratory effort GI: Abdomen is soft, nontender, nondistended, no abdominal masses. No CVAT.  No inguinal hernias.  Genitourinary: Normal male phallus, testes are descended bilaterally and non-tender and without masses, scrotum is normal in appearance without lesions or masses, perineum is normal on inspection.  Prostate 40 g, symmetrical, nonnodular, nontender. Lymphatic: No lymphadenopathy Neurologic: Grossly intact, no focal deficits Psychiatric: Normal mood  and affect  I have reviewed prior pt notes  I have reviewed notes from referring/previous physicians  I have reviewed urinalysis results  I have reviewed prior PSA results    Impression/Assessment:  1.  BPH, symptoms stable on tamsulosin.  PSA 1.1.  2.  ED, on Cialis  Plan:  1.  I recommended not necessarily checking his PSA on an annual basis.  He would like to do this because of hazardous work environment in the past  2.  Refilled tamsulosin and tadalafil  3.  I will see back in a year

## 2022-04-18 LAB — URINALYSIS, ROUTINE W REFLEX MICROSCOPIC
Bilirubin, UA: NEGATIVE
Glucose, UA: NEGATIVE
Ketones, UA: NEGATIVE
Leukocytes,UA: NEGATIVE
Nitrite, UA: NEGATIVE
Protein,UA: NEGATIVE
Specific Gravity, UA: 1.02 (ref 1.005–1.030)
Urobilinogen, Ur: 0.2 mg/dL (ref 0.2–1.0)
pH, UA: 6 (ref 5.0–7.5)

## 2022-04-18 LAB — MICROSCOPIC EXAMINATION
Bacteria, UA: NONE SEEN
Epithelial Cells (non renal): NONE SEEN /hpf (ref 0–10)

## 2022-04-23 DIAGNOSIS — F4312 Post-traumatic stress disorder, chronic: Secondary | ICD-10-CM | POA: Diagnosis not present

## 2022-04-24 ENCOUNTER — Other Ambulatory Visit: Payer: Self-pay | Admitting: Urology

## 2022-04-24 DIAGNOSIS — N5201 Erectile dysfunction due to arterial insufficiency: Secondary | ICD-10-CM

## 2022-05-08 DIAGNOSIS — J0100 Acute maxillary sinusitis, unspecified: Secondary | ICD-10-CM | POA: Diagnosis not present

## 2022-05-08 DIAGNOSIS — Z6834 Body mass index (BMI) 34.0-34.9, adult: Secondary | ICD-10-CM | POA: Diagnosis not present

## 2022-05-08 DIAGNOSIS — I10 Essential (primary) hypertension: Secondary | ICD-10-CM | POA: Diagnosis not present

## 2022-05-08 DIAGNOSIS — N400 Enlarged prostate without lower urinary tract symptoms: Secondary | ICD-10-CM | POA: Diagnosis not present

## 2022-06-05 DIAGNOSIS — F4312 Post-traumatic stress disorder, chronic: Secondary | ICD-10-CM | POA: Diagnosis not present

## 2022-06-27 ENCOUNTER — Telehealth: Payer: Self-pay

## 2022-06-27 NOTE — Telephone Encounter (Signed)
Patient called and advised they needed a refill on medication below.   Medication: oxybutynin (DITROPAN) 5 MG tablet  tamsulosin (FLOMAX) 0.4 MG CAPS capsule  Pharm[facility]: [facility] ([place], [place]   Thank you

## 2022-06-29 ENCOUNTER — Other Ambulatory Visit: Payer: Self-pay

## 2022-06-29 DIAGNOSIS — N3281 Overactive bladder: Secondary | ICD-10-CM

## 2022-06-29 MED ORDER — OXYBUTYNIN CHLORIDE 5 MG PO TABS: 5.0000 mg | ORAL_TABLET | Freq: Every day | ORAL | 3 refills | Status: DC

## 2022-06-29 MED ORDER — TAMSULOSIN HCL 0.4 MG PO CAPS: 0.4000 mg | ORAL_CAPSULE | Freq: Two times a day (BID) | ORAL | 3 refills | Status: DC

## 2022-06-29 MED ORDER — TAMSULOSIN HCL 0.4 MG PO CAPS: 0.4000 mg | ORAL_CAPSULE | Freq: Two times a day (BID) | ORAL | 3 refills | Status: AC

## 2022-06-29 NOTE — Telephone Encounter (Signed)
Rx refilled with previous sig

## 2022-07-03 ENCOUNTER — Telehealth: Payer: Self-pay | Admitting: *Deleted

## 2022-07-03 ENCOUNTER — Encounter: Payer: Self-pay | Admitting: *Deleted

## 2022-07-03 ENCOUNTER — Ambulatory Visit (INDEPENDENT_AMBULATORY_CARE_PROVIDER_SITE_OTHER): Payer: PPO | Admitting: Internal Medicine

## 2022-07-03 ENCOUNTER — Encounter: Payer: Self-pay | Admitting: Internal Medicine

## 2022-07-03 VITALS — BP 136/77 | HR 68 | Temp 97.5°F | Ht 71.0 in | Wt 242.6 lb

## 2022-07-03 DIAGNOSIS — K219 Gastro-esophageal reflux disease without esophagitis: Secondary | ICD-10-CM

## 2022-07-03 DIAGNOSIS — K641 Second degree hemorrhoids: Secondary | ICD-10-CM

## 2022-07-03 DIAGNOSIS — Z86010 Personal history of colonic polyps: Secondary | ICD-10-CM | POA: Diagnosis not present

## 2022-07-03 MED ORDER — PEG 3350-KCL-NA BICARB-NACL 420 G PO SOLR: 4000.0000 mL | Freq: Once | ORAL | 0 refills | Status: AC

## 2022-07-03 NOTE — Patient Instructions (Signed)
It was good to see you again today!  Continue Protonix 40 mg twice daily 30 minutes before breakfast and supper.  If you could lose some weight (decrease caloric intake and get more exercise) this would be of overall benefit in managing  GERD and your health in general  We will schedule a surveillance colonoscopy (history of colonic polyps) ASA 2 in the near future  Further recommendations to follow.

## 2022-07-03 NOTE — Progress Notes (Unsigned)
  Primary Care Physician:  Fusco, Lawrence, MD Primary Gastroenterologist:  Dr. Kairi Harshbarger  Pre-Procedure History & Physical: HPI:  Casey Castillo is a 77 y.o. male here for follow-up of rectal bleeding colon polyps.  Status post multiple hemorrhoid banding sessions with good results.  Patient has not had any more rectal bleeding or urgency.  He does not dwell on the toilet.  He is happy with the result.  With reviewing the chart, it was noted he had multiple colonic adenomas removed 2021; due for surveillance colonoscopy now.  Reflux well-controlled on pantoprazole 40 mg twice daily.  Weight gain likely predisposing him to reflux.    Past Medical History:  Diagnosis Date   Arthritis    Cervical myelopathy with cervical radiculopathy (HCC)    Claustrophobia    Coronary artery disease    5%blockage on left anterior descending Dr. Croitoru    Diverticulitis    hx of   Enlarged prostate    takes flomax   GERD (gastroesophageal reflux disease)    History of blood clots    november, 5 1995- left leg; 2014 - left calf   History of kidney stones    Hyperlipidemia    primary Dr. L.  Fusco   Pneumonia    as a child  14 YEARS OLD   PTSD (post-traumatic stress disorder)    PTSD (post-traumatic stress disorder)    Seasonal allergies    Urinary frequency    hx of     Past Surgical History:  Procedure Laterality Date   ANTERIOR CERVICAL DECOMP/DISCECTOMY FUSION  01/28/2012   Procedure: ANTERIOR CERVICAL DECOMPRESSION/DISCECTOMY FUSION 2 LEVELS;  Surgeon: Henry Elsner, MD;  Location: [facility];  Service: Neurosurgery;  Laterality: N/A;  Cervical six-seven, cervical seven-thoracic one Anterior cervical decompression/diskectomy/fusion   ANTERIOR CERVICAL DECOMP/DISCECTOMY FUSION N/A 12/24/2017   Procedure: ANTERIOR CERVICAL DECOMPRESSION/DISCECTOMY FUSION CERVICAL FOUR-FIVE/CERVICAL FIVE-SIX TWO LEVELS;  Surgeon: Elsner, Henry, MD;  Location: [facility];  Service: Neurosurgery;  Laterality: N/A;   ANTERIOR CERVICAL DECOMPRESSION/DISCECTOMY FUSION CERVICAL FOUR-FIVE/CERVICAL FIVE-SIX TWO LEVELS   CARDIAC CATHETERIZATION     2006   CARDIOVASCULAR STRESS TEST     2006 and 2008 at [facility], 2011 myoview and echo   COLONOSCOPY     COLONOSCOPY N/A 08/30/2015   Dr. Jenkins: one 20 mm polyp in proximal ascending colon, removed piecemeal using hot snare. Resected and retrieved. Diverticulosis in sigmoid colon. Path with tubular adenoma, no high grade dysplasia. 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 Minimal hemorrhoids remain. Tubular adenomas. 3 years.    engrown  toenail Bilateral    EYE SURGERY     Laser for torn retina   HEMORRHOID SURGERY     sept 8, 2008   KIDNEY STONE SURGERY     lithotripsy 1980's   KNEE SURGERY     left 04-11-94 ( has had two surgeries to left knee)   NASAL SINUS SURGERY     Jun 30 1998   [facility] MYOVIEW LTD    07/20/2009   no ischemia   PILONIDAL CYST / SINUS EXCISION     surgery 01-08-74   POLYPECTOMY  08/26/2019   Procedure: POLYPECTOMY;  Surgeon: Ashan Cueva M, MD;  Location: [facility];  Service: Endoscopy;;   POLYPECTOMY  02/25/2020   Procedure: POLYPECTOMY;  Surgeon: Niasia Lanphear M, MD;  Location: [facility];  Service: Endoscopy;;   RADIOLOGY WITH ANESTHESIA N/A 06/12/2018   Procedure: MRI OF CHEST WITH AND WITHOUT CONTRAST;  Surgeon: Radiologist, Medication, MD;  Location: [facility];  Service: Radiology;  Laterality:  N/A;   SHOULDER SURGERY     right 12-20-2003   TOTAL KNEE ARTHROPLASTY Left 09/15/2019   Procedure: TOTAL KNEE ARTHROPLASTY;  Surgeon: Murphy, Timothy D, MD;  Location: [facility];  Service: Orthopedics;  Laterality: Left;   TRIGGER FINGER RELEASE     03-21-98 right hand ring finger   US ECHOCARDIOGRAPHY  07/20/2009   LA & RA mildly dilated,trace MR,TR.    Prior to Admission medications   Medication Sig Start Date End Date Taking? Authorizing Provider  acetaminophen (TYLENOL) 650 MG CR tablet Take 650-1,300 mg by mouth as needed for pain.   Yes [provider]  aspirin EC 81 MG tablet Take 81 mg by mouth daily. Swallow whole.   Yes [provider]  Biotin 5000 MCG TABS Take 5,000 mcg by mouth daily.    Yes [provider]  calcium elemental as carbonate (BARIATRIC TUMS ULTRA) 400 MG chewable tablet Chew 2,000-3,000 mg by mouth daily as needed for heartburn.   Yes [provider]  Cholecalciferol (VITAMIN D) 2000 UNITS CAPS Take 2,000 Units by mouth at bedtime.    Yes [provider]  furosemide (LASIX) 20 MG tablet Take 20 mg by mouth 3 (three) times daily. 06/25/22  Yes [provider]  loratadine (CLARITIN) 10 MG tablet Take 10 mg by mouth daily.    Yes [provider]  Lysine HCl 500 MG TABS Take 500 mg by mouth at bedtime.    Yes [provider]  Melatonin 5 MG TABS Take 5 mg by mouth at bedtime as needed (sleep).   Yes [provider]  Omega-3 Fatty Acids (FISH OIL) 1200 MG CAPS Take 1,200 mg by mouth at bedtime.   Yes [provider]  oxybutynin (DITROPAN) 5 MG tablet Take 1 tablet (5 mg total) by mouth daily. 06/29/22  Yes Dahlstedt, Stephen, MD  pantoprazole (PROTONIX) 40 MG tablet Take 40 mg by mouth 2 (two) times daily. 03/24/14  Yes [provider]  simvastatin (ZOCOR) 40 MG tablet Take 1 tablet (40 mg total) by mouth daily. 03/12/22  Yes Croitoru, Mihai, MD  tadalafil (CIALIS) 5 MG tablet TAKE  ONE TABLET BY MOUTH EVERY EVENING 04/24/22  Yes Dahlstedt, Stephen, MD  tamsulosin (FLOMAX) 0.4 MG CAPS capsule Take 1 capsule (0.4 mg total) by mouth 2 (two) times daily. 06/29/22  Yes Dahlstedt, Stephen, MD  valACYclovir (VALTREX) 1000 MG tablet Take 500 mg by mouth daily as needed (fever blisters.).   Yes [provider]    Allergies as of 07/03/2022 - Review Complete 07/03/2022  Allergen Reaction Noted   Adhesive [tape] Hives, Rash, and Other (See Comments) 05/26/2013    Family History  Problem Relation Age of Onset   Heart attack Father    Dementia Mother    Heart disease Other    Colon cancer Neg Hx     Social History   Socioeconomic History   Marital status: Widowed      Spouse name: Not on file   Number of children: Not on file   Years of education: Not on file   Highest education level: Not on file  Occupational History   Not on file  Tobacco Use   Smoking status: Former    Packs/day: 1.00    Years: 20.00    Total pack years: 20.00    Types: Cigarettes    Quit date: 1972    Years since quitting: 52.0   Smokeless tobacco: Never   Tobacco comments:    quit smokin cigarettes at age 33  Vaping Use   Vaping Use: Never used  Substance and Sexual Activity   Alcohol use: No   Drug use: No   Sexual activity: Not Currently  Other Topics Concern   Not on file  Social History Narrative   Not on file   Social Determinants of Health   Financial Resource Strain: Not on file  Food Insecurity: Not on file  Transportation Needs: Not on file  Physical Activity: Not on file  Stress: Not on file  Social Connections: Not on file  Intimate Partner Violence: Not on file    Review of Systems: See HPI, otherwise negative ROS  Physical Exam: BP 136/77 (BP Location: Right Arm, Patient Position: Sitting, Cuff Size: Large)   Pulse 68   Temp (!) 97.5 F (36.4 C) (Oral)   Ht 5' 11" (1.803 m)   Wt 242 lb 9.6 oz (110 kg)   SpO2 94%   BMI 33.84 kg/m  General:    Alert,  Well-developed, well-nourished, pleasant and cooperative in NAD Neck:  Supple; no masses or thyromegaly. No significant cervical adenopathy. Lungs:  Clear throughout to auscultation.   No wheezes, crackles, or rhonchi. No acute distress. Heart:  Regular rate and rhythm; no murmurs, clicks, rubs,  or gallops. Abdomen: Non-distended, normal bowel sounds.  Soft and nontender without appreciable mass or hepatosplenomegaly.  Pulses:  Normal pulses noted. Extremities:  Without clubbing or edema.  Impression/Plan: 76-year-old gentleman with a history of symptomatic hemorrhoids - nice result from hemorrhoid banding.  History of multiple colonic adenomas , serrated polyps as well removed 2021; due for surveillance colonoscopy at this time.  GERD well-controlled on twice daily PPI.  We discussed obesity and its relation to GERD.     Recommendations:  Continue Protonix 40 mg twice daily 30 minutes before breakfast and supper.  If you could lose some weight (decrease caloric intake and get more exercise) this would be of overall benefit in managing  GERD and your health in general  We will schedule a surveillance colonoscopy (history of colonic polyps) ASA 2 in the near future.  The risks, benefits, limitations, alternatives and imponderables have been reviewed with the patient. Questions have been answered. All parties are agreeable.    Further recommendations to follow.     Notice: This dictation was prepared with Dragon dictation along with smaller phrase technology. Any transcriptional errors that result from this process are unintentional and may not be corrected upon review.  

## 2022-07-03 NOTE — H&P (View-Only) (Signed)
Primary Care Physician:  Redmond School, MD Primary Gastroenterologist:  Dr. Gala Romney  Pre-Procedure History & Physical: HPI:  Casey Castillo is a 77 y.o. male here for follow-up of rectal bleeding colon polyps.  Status post multiple hemorrhoid banding sessions with good results.  Patient has not had any more rectal bleeding or urgency.  He does not dwell on the toilet.  He is happy with the result.  With reviewing the chart, it was noted he had multiple colonic adenomas removed 2021; due for surveillance colonoscopy now.  Reflux well-controlled on pantoprazole 40 mg twice daily.  Weight gain likely predisposing him to reflux.    Past Medical History:  Diagnosis Date   Arthritis    Cervical myelopathy with cervical radiculopathy (HCC)    Claustrophobia    Coronary artery disease    5%blockage on left anterior descending Dr. Sallyanne Kuster    Diverticulitis    hx of   Enlarged prostate    takes flomax   GERD (gastroesophageal reflux disease)    History of blood clots    november, 5 1995- left leg; 2014 - left calf   History of kidney stones    Hyperlipidemia    primary Dr. Selena Batten   Pneumonia    as a child  61 YEARS OLD   PTSD (post-traumatic stress disorder)    PTSD (post-traumatic stress disorder)    Seasonal allergies    Urinary frequency    hx of     Past Surgical History:  Procedure Laterality Date   ANTERIOR CERVICAL DECOMP/DISCECTOMY FUSION  01/28/2012   Procedure: ANTERIOR CERVICAL DECOMPRESSION/DISCECTOMY FUSION 2 LEVELS;  Surgeon: Kristeen Miss, MD;  Location: [facility];  Service: Neurosurgery;  Laterality: N/A;  Cervical six-seven, cervical seven-thoracic one Anterior cervical decompression/diskectomy/fusion   ANTERIOR CERVICAL DECOMP/DISCECTOMY FUSION N/A 12/24/2017   Procedure: ANTERIOR CERVICAL DECOMPRESSION/DISCECTOMY FUSION CERVICAL FOUR-FIVE/CERVICAL FIVE-SIX TWO LEVELS;  Surgeon: Kristeen Miss, MD;  Location: [facility];  Service: Neurosurgery;  Laterality: N/A;   ANTERIOR CERVICAL DECOMPRESSION/DISCECTOMY FUSION CERVICAL FOUR-FIVE/CERVICAL FIVE-SIX TWO LEVELS   CARDIAC CATHETERIZATION     2006   CARDIOVASCULAR STRESS TEST     2006 and 2008 at [facility], 2011 myoview and echo   COLONOSCOPY     COLONOSCOPY N/A 08/30/2015   Dr. Arnoldo Morale: one 20 mm polyp in proximal ascending colon, removed piecemeal using hot snare. Resected and retrieved. Diverticulosis in sigmoid colon. Path with tubular adenoma, no high grade dysplasia. 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 Minimal hemorrhoids remain. Tubular adenomas. 3 years.    engrown  toenail Bilateral    EYE SURGERY     Laser for torn retina   HEMORRHOID SURGERY     sept 8, 2008   KIDNEY STONE SURGERY     lithotripsy 1980's   KNEE SURGERY     left 04-11-94 ( has had two surgeries to left knee)   NASAL SINUS SURGERY     Jun 30 1998   [place]  07/20/2009   no ischemia   PILONIDAL CYST / SINUS EXCISION     surgery 01-08-74   POLYPECTOMY  08/26/2019   Procedure: POLYPECTOMY;  Surgeon: Daneil Dolin, MD;  [facility];  Service: Endoscopy;;   POLYPECTOMY  02/25/2020   Procedure: POLYPECTOMY;  Surgeon: Daneil Dolin, MD;  [facility]: [facility];  Service: Endoscopy;;   RADIOLOGY WITH ANESTHESIA N/A 06/12/2018   Procedure: MRI OF CHEST WITH AND WITHOUT CONTRAST;  Surgeon: Radiologist, Medication, MD;  Location: [facility];  Service: Radiology;  Laterality:  N/A;   SHOULDER SURGERY     right 12-20-2003   TOTAL KNEE ARTHROPLASTY Left 09/15/2019   Procedure: TOTAL KNEE ARTHROPLASTY;  Surgeon: Renette Butters, MD;  Location: [facility];  Service: Orthopedics;  Laterality: Left;   TRIGGER FINGER RELEASE     03-21-98 right hand ring finger   US ECHOCARDIOGRAPHY  07/20/2009   LA & RA mildly dilated,trace MR,TR.    Prior to Admission medications   Medication Sig Start Date End Date Taking? Authorizing Provider  acetaminophen (TYLENOL) 650 MG CR tablet Take 650-1,300 mg by mouth as needed for pain.   Yes [provider]  aspirin EC 81 MG tablet Take 81 mg by mouth daily. Swallow whole.   Yes [provider]  Biotin 5000 MCG TABS Take 5,000 mcg by mouth daily.    Yes [provider]  calcium elemental as carbonate (BARIATRIC TUMS ULTRA) 400 MG chewable tablet Chew 2,000-3,000 mg by mouth daily as needed for heartburn.   Yes [provider]  Cholecalciferol (VITAMIN D) 2000 UNITS CAPS Take 2,000 Units by mouth at bedtime.    Yes [provider]  furosemide (LASIX) 20 MG tablet Take 20 mg by mouth 3 (three) times daily. 06/25/22  Yes [provider]  loratadine (CLARITIN) 10 MG tablet Take 10 mg by mouth daily.    Yes [provider]  Lysine HCl 500 MG TABS Take 500 mg by mouth at bedtime.    Yes [provider]  Melatonin 5 MG TABS Take 5 mg by mouth at bedtime as needed (sleep).   Yes [provider]  Omega-3 Fatty Acids (FISH OIL) 1200 MG CAPS Take 1,200 mg by mouth at bedtime.   Yes [provider]  oxybutynin (DITROPAN) 5 MG tablet Take 1 tablet (5 mg total) by mouth daily. 06/29/22  Yes Dahlstedt, Annie Main, MD  pantoprazole (PROTONIX) 40 MG tablet Take 40 mg by mouth 2 (two) times daily. 03/24/14  Yes [provider]  simvastatin (ZOCOR) 40 MG tablet Take 1 tablet (40 mg total) by mouth daily. 03/12/22  Yes Croitoru, Mihai, MD  tadalafil (CIALIS) 5 MG tablet TAKE  ONE TABLET BY MOUTH EVERY EVENING 04/24/22  Yes Dahlstedt, Annie Main, MD  tamsulosin (FLOMAX) 0.4 MG CAPS capsule Take 1 capsule (0.4 mg total) by mouth 2 (two) times daily. 06/29/22  Yes Dahlstedt, Annie Main, MD  valACYclovir (VALTREX) 1000 MG tablet Take 500 mg by mouth daily as needed (fever blisters.).   Yes [provider]    Allergies as of 07/03/2022 - Review Complete 07/03/2022  Allergen Reaction Noted   Adhesive [tape] Hives, Rash, and Other (See Comments) 05/26/2013    Family History  Problem Relation Age of Onset   Heart attack Father    Dementia Mother    Heart disease Other    Colon cancer Neg Hx     Social History   Socioeconomic History   Marital status: Widowed  Spouse name: Not on file   Number of children: Not on file   Years of education: Not on file   Highest education level: Not on file  Occupational History   Not on file  Tobacco Use   Smoking status: Former    Packs/day: 1.00    Years: 20.00    Total pack years: 20.00    Types: Cigarettes    Quit date: 69    Years since quitting: 52.0   Smokeless tobacco: Never   Tobacco comments:    quit smokin cigarettes at age 32  Vaping Use   Vaping Use: Never used  Substance and Sexual Activity   Alcohol use: No   Drug use: No   Sexual activity: Not Currently  Other Topics Concern   Not on file  Social History Narrative   Not on file   Social Determinants of Health   Financial Resource Strain: Not on file  Food Insecurity: Not on file  Transportation Needs: Not on file  Physical Activity: Not on file  Stress: Not on file  Social Connections: Not on file  Intimate Partner Violence: Not on file    Review of Systems: See HPI, otherwise negative ROS  Physical Exam: BP 136/77 (BP Location: Right Arm, Patient Position: Sitting, Cuff Size: Large)   Pulse 68   Temp (!) 97.5 F (36.4 C) (Oral)   Ht 5' 11"$  (1.803 m)   Wt 242 lb 9.6 oz (110 kg)   SpO2 94%   BMI 33.84 kg/m  General:    Alert,  Well-developed, well-nourished, pleasant and cooperative in NAD Neck:  Supple; no masses or thyromegaly. No significant cervical adenopathy. Lungs:  Clear throughout to auscultation.   No wheezes, crackles, or rhonchi. No acute distress. Heart:  Regular rate and rhythm; no murmurs, clicks, rubs,  or gallops. Abdomen: Non-distended, normal bowel sounds.  Soft and nontender without appreciable mass or hepatosplenomegaly.  Pulses:  Normal pulses noted. Extremities:  Without clubbing or edema.  Impression/Plan: 77 year old gentleman with a history of symptomatic hemorrhoids - nice result from hemorrhoid banding.  History of multiple colonic adenomas , serrated polyps as well removed 2021; due for surveillance colonoscopy at this time.  GERD well-controlled on twice daily PPI.  We discussed obesity and its relation to GERD.     Recommendations:  Continue Protonix 40 mg twice daily 30 minutes before breakfast and supper.  If you could lose some weight (decrease caloric intake and get more exercise) this would be of overall benefit in managing  GERD and your health in general  We will schedule a surveillance colonoscopy (history of colonic polyps) ASA 2 in the near future.  The risks, benefits, limitations, alternatives and imponderables have been reviewed with the patient. Questions have been answered. All parties are agreeable.    Further recommendations to follow.     Notice: This dictation was prepared with Dragon dictation along with smaller phrase technology. Any transcriptional errors that result from this process are unintentional and may not be corrected upon review.

## 2022-07-03 NOTE — Telephone Encounter (Signed)
Called pt, LMOVM to call back to schedule TCS with Dr. Gala Romney, ASA 2, will need BMET prior

## 2022-07-03 NOTE — Telephone Encounter (Signed)
Pt has been scheduled for 07/23/22, instructions mailed and lab ordered in epic.

## 2022-07-08 ENCOUNTER — Ambulatory Visit
Admission: RE | Admit: 2022-07-08 | Discharge: 2022-07-08 | Disposition: A | Discharge status: Home / Self Care | Payer: PPO | Source: Ambulatory Visit | Attending: Family Medicine | Admitting: Family Medicine

## 2022-07-08 VITALS — BP 144/77 | HR 73 | Temp 97.7°F | Resp 20

## 2022-07-08 DIAGNOSIS — H00015 Hordeolum externum left lower eyelid: Secondary | ICD-10-CM | POA: Diagnosis not present

## 2022-07-08 MED ORDER — ERYTHROMYCIN 5 MG/GM OP OINT: TOPICAL_OINTMENT | OPHTHALMIC | 0 refills | Status: AC

## 2022-07-08 NOTE — ED Provider Notes (Signed)
[facility]    CSN: [id number] Arrival date & time: 07/08/22  0858      History   Chief Complaint Chief Complaint  Patient presents with   Eye Problem    HPI Casey Castillo is a 77 y.o. male.   Patient presenting today with 2-day history of a painful bump to the left lower eyelid which is causing eye irritation with blinking, eye drainage.  Denies visual change, headache, injury to the eye, vomiting, headache.  So far trying warm compresses with minimal relief.  Has an appointment with the ophthalmologist scheduled for Tuesday.    Past Medical History:  Diagnosis Date   Arthritis    Cervical myelopathy with cervical radiculopathy (HCC)    Claustrophobia    Coronary artery disease    5%blockage on left anterior descending Dr. Sallyanne Kuster    Diverticulitis    hx of   Enlarged prostate    takes flomax   GERD (gastroesophageal reflux disease)    History of blood clots    november, 5 1995- left leg; 2014 - left calf   History of kidney stones    Hyperlipidemia    primary Dr. Selena Batten   Pneumonia    as a child  88 YEARS OLD   PTSD (post-traumatic stress disorder)    PTSD (post-traumatic stress disorder)    Seasonal allergies    Urinary frequency    hx of     Patient Active Problem List   Diagnosis Date Noted   PTSD (post-traumatic stress disorder) 09/01/2019   GERD (gastroesophageal reflux disease) 09/01/2019   Primary osteoarthritis of left knee 09/01/2019   Grade II hemorrhoids 07/03/2019   Other hemorrhoids 05/06/2019   History of colonic polyps 05/06/2019   Cervical spondylosis with myelopathy and radiculopathy 12/24/2017   Coronary artery disease involving native coronary artery of native heart without angina pectoris 12/23/2017   Hypercholesterolemia 12/23/2017   Mild obesity 09/20/2016   Edema of both legs 01/17/2013   Long term current use of anticoagulant therapy 12/30/2012   History of deep vein thrombosis (DVT) of lower extremity  12/26/2012   Dyslipidemia 12/26/2012   Coronary atherosclerosis without stenosis 12/26/2012    Past Surgical History:  Procedure Laterality Date   ANTERIOR CERVICAL DECOMP/DISCECTOMY FUSION  01/28/2012   Procedure: ANTERIOR CERVICAL DECOMPRESSION/DISCECTOMY FUSION 2 LEVELS;  Surgeon: Kristeen Miss, MD;  Location: [facility];  Service: Neurosurgery;  Laterality: N/A;  Cervical six-seven, cervical seven-thoracic one Anterior cervical decompression/diskectomy/fusion   ANTERIOR CERVICAL DECOMP/DISCECTOMY FUSION N/A 12/24/2017   Procedure: ANTERIOR CERVICAL DECOMPRESSION/DISCECTOMY FUSION CERVICAL FOUR-FIVE/CERVICAL FIVE-SIX TWO LEVELS;  Surgeon: Kristeen Miss, MD;  Location: [place];  Service: Neurosurgery;  Laterality: N/A;  ANTERIOR CERVICAL DECOMPRESSION/DISCECTOMY FUSION CERVICAL FOUR-FIVE/CERVICAL FIVE-SIX TWO LEVELS   CARDIAC CATHETERIZATION     2006   CARDIOVASCULAR STRESS TEST     2006 and 2008 at [facility], 2011 myoview and echo   COLONOSCOPY     COLONOSCOPY N/A 08/30/2015   Dr. Arnoldo Morale: one 20 mm polyp in proximal ascending colon, removed piecemeal using hot snare. Resected and retrieved. Diverticulosis in sigmoid colon. Path with tubular adenoma, no high grade dysplasia. 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 edge   COLONOSCOPY N/A 08/26/2019   sigmoid and descending colon diverticulosis, seven 3-7 mm polyps in cecum, (tubular adenomas and sessile serrated) four 4-6 mm polyps in ascending colon (tubular adenomas), two 4-6 mm polyps in sigmoid and splenic flexure (tubular adenomas), one 25 mm polyp in  ascending colon (tubular adenoma) removed via piecemeal fashion   COLONOSCOPY WITH PROPOFOL N/A 02/25/2020   Colonoscopy Sept 2021: two 2-4 mm polyps in rectum and cecum, diverticulosis in sigmoid and descending colon. Minimal hemorrhoids remain. Tubular adenomas. 3 years.    engrown  toenail Bilateral    EYE SURGERY     Laser for torn retina    HEMORRHOID SURGERY     sept 8, 2008   KIDNEY STONE SURGERY     lithotripsy 1980's   KNEE SURGERY     left 04-11-94 ( has had two surgeries to left knee)   NASAL SINUS SURGERY     Jun 30 1998   NM MYOVIEW LTD  07/20/2009   no ischemia   PILONIDAL CYST / SINUS EXCISION     surgery 01-08-74   POLYPECTOMY  08/26/2019   Procedure: POLYPECTOMY;  Surgeon: Daneil Dolin, MD;  Location: [facility];  Service: Endoscopy;;   POLYPECTOMY  02/25/2020   Procedure: POLYPECTOMY;  Surgeon: Daneil Dolin, MD;  Location: [facility];  Service: Endoscopy;;   RADIOLOGY WITH ANESTHESIA N/A 06/12/2018   Procedure: MRI OF CHEST WITH AND WITHOUT CONTRAST;  Surgeon: Radiologist, Medication, MD;  Location: [place];  Service: Radiology;  Laterality: N/A;   SHOULDER SURGERY     right 12-20-2003   TOTAL KNEE ARTHROPLASTY Left 09/15/2019   Procedure: TOTAL KNEE ARTHROPLASTY;  Surgeon: Renette Butters, MD;  Location: [facility];  Service: Orthopedics;  Laterality: Left;   TRIGGER FINGER RELEASE     03-21-98 right hand ring finger   US ECHOCARDIOGRAPHY  07/20/2009   LA & RA mildly dilated,trace MR,TR.       Home Medications    Prior to Admission medications   Medication Sig Start Date End Date Taking? Authorizing Provider  erythromycin ophthalmic ointment Place a 1/2 inch ribbon of ointment into the left lower eyelid BID prn. 07/08/22  Yes Volney American, PA-C  acetaminophen (TYLENOL) 650 MG CR tablet Take 650-1,300 mg by mouth as needed for pain.    [provider]  aspirin EC 81 MG tablet Take 81 mg by mouth daily. Swallow whole.    [provider]  Biotin 5000 MCG TABS Take 5,000 mcg by mouth daily.     [provider]  calcium elemental as carbonate (BARIATRIC TUMS ULTRA) 400 MG chewable tablet Chew 2,000-3,000 mg by mouth daily as needed for heartburn.    [provider]  Cholecalciferol (VITAMIN D) 2000 UNITS CAPS Take 2,000 Units by mouth at bedtime.     [provider]  furosemide (LASIX) 20 MG tablet Take 20 mg by mouth 3 (three) times daily. 06/25/22   [provider]  loratadine (CLARITIN) 10 MG tablet Take 10 mg by mouth daily.     [provider]  Lysine HCl 500 MG TABS Take 500 mg by mouth at bedtime.     [provider]  Melatonin 5 MG TABS Take 5 mg by mouth at bedtime as needed (sleep).    [provider]  Omega-3 Fatty Acids (FISH OIL) 1200 MG CAPS Take 1,200 mg by mouth at bedtime.    [provider]  oxybutynin (DITROPAN) 5 MG tablet Take 1 tablet (5 mg total) by mouth daily. 06/29/22   Franchot Gallo, MD  pantoprazole (PROTONIX) 40 MG tablet Take 40 mg by mouth 2 (two) times daily. 03/24/14   [provider]  simvastatin (ZOCOR) 40 MG tablet Take 1 tablet (40 mg  total) by mouth daily. 03/12/22   Croitoru, Mihai, MD  tadalafil (CIALIS) 5 MG tablet TAKE ONE TABLET BY MOUTH EVERY EVENING 04/24/22   Franchot Gallo, MD  tamsulosin (FLOMAX) 0.4 MG CAPS capsule Take 1 capsule (0.4 mg total) by mouth 2 (two) times daily. 06/29/22   Franchot Gallo, MD  valACYclovir (VALTREX) 1000 MG tablet Take 500 mg by mouth daily as needed (fever blisters.).    [provider]    Family History Family History  Problem Relation Age of Onset   Heart attack Father    Dementia Mother    Heart disease Other    Colon cancer Neg Hx     Social History Social History   Tobacco Use   Smoking status: Former    Packs/day: 1.00    Years: 20.00    Total pack years: 20.00    Types: Cigarettes    Quit date: 1972    Years since quitting: 52.1   Smokeless tobacco: Never   Tobacco comments:    quit smokin cigarettes at age 22  Vaping Use   Vaping Use: Never used  Substance Use Topics   Alcohol use: No   Drug use: No     Allergies   Adhesive [tape]   Review of Systems Review of Systems Per HPI  Physical Exam Triage Vital Signs ED Triage Vitals [07/08/22 0934]  Enc Vitals  Group     BP (!) 144/77     Pulse Rate 73     Resp 20     Temp 97.7 F (36.5 C)     Temp Source Oral     SpO2 94 %     Weight      Height      Head Circumference      Peak Flow      Pain Score 6     Pain Loc      Pain Edu?      Excl. in [place]?    No data found.  Updated Vital Signs BP (!) 144/77 (BP Location: Right Arm)   Pulse 73   Temp 97.7 F (36.5 C) (Oral)   Resp 20   SpO2 94%   Visual Acuity Right Eye Distance:   Left Eye Distance:   Bilateral Distance:    Right Eye Near:   Left Eye Near:    Bilateral Near:     Physical Exam Vitals and nursing note reviewed.  Constitutional:      Appearance: Normal appearance.  HENT:     Head: Atraumatic.     Mouth/Throat:     Mouth: Mucous membranes are moist.  Eyes:     Extraocular Movements: Extraocular movements intact.     Comments: Stye present to the left inner lower eyelid with surrounding conjunctival erythema, clear drainage  Cardiovascular:     Rate and Rhythm: Normal rate and regular rhythm.  Pulmonary:     Effort: Pulmonary effort is normal.     Breath sounds: Normal breath sounds.  Musculoskeletal:        General: Normal range of motion.     Cervical back: Normal range of motion and neck supple.  Skin:    General: Skin is warm and dry.  Neurological:     General: No focal deficit present.     Mental Status: He is oriented to person, place, and time.  Psychiatric:        Mood and Affect: Mood normal.        Thought Content: Thought  content normal.        Judgment: Judgment normal.      UC Treatments / Results  Labs (all labs ordered are listed, but only abnormal results are displayed) Labs Reviewed - No data to display  EKG   Radiology No results found.  Procedures Procedures (including critical care time)  Medications Ordered in UC Medications - No data to display  Initial Impression / Assessment and Plan / UC Course  I have reviewed the triage vital signs and the nursing  notes.  Pertinent labs & imaging results that were available during my care of the patient were reviewed by me and considered in my medical decision making (see chart for details).     Visual acuity declined as patient's vision is intact.  Will treat with erythromycin ointment, warm compresses and ophthalmology follow-up.  Return for worsening symptoms.  Final Clinical Impressions(s) / UC Diagnoses   Final diagnoses:  Hordeolum externum of left lower eyelid   Discharge Instructions   None    ED Prescriptions     Medication Sig Dispense Auth. Provider   erythromycin ophthalmic ointment Place a 1/2 inch ribbon of ointment into the left lower eyelid BID prn. 3.5 g Volney American, PA-C      PDMP not reviewed this encounter.   Volney American, [place] 07/08/22 1001

## 2022-07-08 NOTE — ED Triage Notes (Addendum)
Pt reports he has an internal stye on his left eye x 2 days.

## 2022-07-09 DIAGNOSIS — I872 Venous insufficiency (chronic) (peripheral): Secondary | ICD-10-CM | POA: Diagnosis not present

## 2022-07-09 DIAGNOSIS — L820 Inflamed seborrheic keratosis: Secondary | ICD-10-CM | POA: Diagnosis not present

## 2022-07-09 DIAGNOSIS — L918 Other hypertrophic disorders of the skin: Secondary | ICD-10-CM | POA: Diagnosis not present

## 2022-07-09 DIAGNOSIS — L821 Other seborrheic keratosis: Secondary | ICD-10-CM | POA: Diagnosis not present

## 2022-07-16 ENCOUNTER — Other Ambulatory Visit (HOSPITAL_COMMUNITY)
Admission: RE | Admit: 2022-07-16 | Discharge: 2022-07-16 | Disposition: A | Discharge status: Home / Self Care | Payer: PPO | Source: Ambulatory Visit | Attending: Internal Medicine | Admitting: Internal Medicine

## 2022-07-16 DIAGNOSIS — Z86010 Personal history of colonic polyps: Secondary | ICD-10-CM | POA: Insufficient documentation

## 2022-07-16 LAB — BASIC METABOLIC PANEL
Anion gap: 8 (ref 5–15)
BUN: 12 mg/dL (ref 8–23)
CO2: 26 mmol/L (ref 22–32)
Calcium: 8.4 mg/dL — ABNORMAL LOW (ref 8.9–10.3)
Chloride: 104 mmol/L (ref 98–111)
Creatinine, Ser: 1.21 mg/dL (ref 0.61–1.24)
GFR, Estimated: >60 mL/min (ref >60)
Glucose, Bld: 160 mg/dL — ABNORMAL HIGH (ref 70–99)
Potassium: 3.8 mmol/L (ref 3.5–5.1)
Sodium: 138 mmol/L (ref 135–145)

## 2022-07-23 ENCOUNTER — Ambulatory Visit (HOSPITAL_COMMUNITY)
Admission: RE | Admit: 2022-07-23 | Discharge: 2022-07-23 | Disposition: A | Discharge status: Home / Self Care | Payer: PPO | Attending: Internal Medicine | Admitting: Internal Medicine

## 2022-07-23 ENCOUNTER — Encounter (HOSPITAL_COMMUNITY)
Admission: RE | Disposition: A | Discharge status: Home / Self Care | Payer: Self-pay | Source: Home / Self Care | Attending: Internal Medicine

## 2022-07-23 ENCOUNTER — Encounter (HOSPITAL_COMMUNITY): Payer: Self-pay | Admitting: Internal Medicine

## 2022-07-23 ENCOUNTER — Ambulatory Visit (HOSPITAL_BASED_OUTPATIENT_CLINIC_OR_DEPARTMENT_OTHER): Payer: PPO | Admitting: Certified Registered Nurse Anesthetist

## 2022-07-23 ENCOUNTER — Ambulatory Visit (HOSPITAL_COMMUNITY): Payer: PPO | Admitting: Certified Registered Nurse Anesthetist

## 2022-07-23 ENCOUNTER — Other Ambulatory Visit: Payer: Self-pay

## 2022-07-23 DIAGNOSIS — Z8719 Personal history of other diseases of the digestive system: Secondary | ICD-10-CM | POA: Insufficient documentation

## 2022-07-23 DIAGNOSIS — K219 Gastro-esophageal reflux disease without esophagitis: Secondary | ICD-10-CM | POA: Diagnosis not present

## 2022-07-23 DIAGNOSIS — I2510 Atherosclerotic heart disease of native coronary artery without angina pectoris: Secondary | ICD-10-CM | POA: Diagnosis not present

## 2022-07-23 DIAGNOSIS — D122 Benign neoplasm of ascending colon: Secondary | ICD-10-CM | POA: Insufficient documentation

## 2022-07-23 DIAGNOSIS — Z86010 Personal history of colonic polyps: Secondary | ICD-10-CM | POA: Diagnosis not present

## 2022-07-23 DIAGNOSIS — Z87891 Personal history of nicotine dependence: Secondary | ICD-10-CM | POA: Insufficient documentation

## 2022-07-23 DIAGNOSIS — K635 Polyp of colon: Secondary | ICD-10-CM

## 2022-07-23 DIAGNOSIS — Z09 Encounter for follow-up examination after completed treatment for conditions other than malignant neoplasm: Secondary | ICD-10-CM | POA: Diagnosis not present

## 2022-07-23 DIAGNOSIS — Z79899 Other long term (current) drug therapy: Secondary | ICD-10-CM | POA: Insufficient documentation

## 2022-07-23 DIAGNOSIS — K5790 Diverticulosis of intestine, part unspecified, without perforation or abscess without bleeding: Secondary | ICD-10-CM | POA: Diagnosis not present

## 2022-07-23 DIAGNOSIS — K5730 Diverticulosis of large intestine without perforation or abscess without bleeding: Secondary | ICD-10-CM | POA: Insufficient documentation

## 2022-07-23 DIAGNOSIS — Z1211 Encounter for screening for malignant neoplasm of colon: Secondary | ICD-10-CM | POA: Diagnosis not present

## 2022-07-23 HISTORY — PX: COLONOSCOPY WITH PROPOFOL: SHX5780

## 2022-07-23 HISTORY — PX: POLYPECTOMY: SHX149

## 2022-07-23 SURGERY — COLONOSCOPY WITH PROPOFOL
Anesthesia: General

## 2022-07-23 MED ORDER — LACTATED RINGERS IV SOLN: INTRAVENOUS | Status: DC | PRN

## 2022-07-23 MED ORDER — PROPOFOL 10 MG/ML IV BOLUS
INTRAVENOUS | Status: DC | PRN
  Administered 2022-07-23: 60 mg via INTRAVENOUS

## 2022-07-23 MED ORDER — LACTATED RINGERS IV SOLN: INTRAVENOUS | Status: DC

## 2022-07-23 MED ORDER — LIDOCAINE 2% (20 MG/ML) 5 ML SYRINGE
INTRAMUSCULAR | Status: DC | PRN
  Administered 2022-07-23: 50 mg via INTRAVENOUS

## 2022-07-23 MED ORDER — PROPOFOL 500 MG/50ML IV EMUL
INTRAVENOUS | Status: DC | PRN
  Administered 2022-07-23: 175 ug/kg/min via INTRAVENOUS

## 2022-07-23 NOTE — Progress Notes (Signed)
Alcohol prep pads x 2 each site, used for left upper chest, right upper chest and left lower abdomen removal of EKG pads. Skin clean, dry and intact .

## 2022-07-23 NOTE — Interval H&P Note (Signed)
History and Physical Interval Note:  07/23/2022 11:07 AM  Casey Castillo  has presented today for surgery, with the diagnosis of SURVEILLANCE.  The various methods of treatment have been discussed with the patient and family. After consideration of risks, benefits and other options for treatment, the patient has consented to  Procedure(s) with comments: COLONOSCOPY WITH PROPOFOL (N/A) - 2:30 PM as a surgical intervention.  The patient's history has been reviewed, patient examined, no change in status, stable for surgery.  I have reviewed the patient's chart and labs.  Questions were answered to the patient's satisfaction.     Kimble Delaurentis   no change.  Surveillance colonoscopy today per plan. The risks, benefits, limitations, alternatives and imponderables have been reviewed with the patient. Questions have been answered. All parties are agreeable.

## 2022-07-23 NOTE — Discharge Instructions (Signed)
  Colonoscopy Discharge Instructions  Read the instructions outlined below and refer to this sheet in the next few weeks. These discharge instructions provide you with general information on caring for yourself after you leave the hospital. Your doctor may also give you specific instructions. While your treatment has been planned according to the most current medical practices available, unavoidable complications occasionally occur. If you have any problems or questions after discharge, call Dr. Gala Romney at [phone]. ACTIVITY You may resume your regular activity, but move at a slower pace for the next 24 hours.  Take frequent rest periods for the next 24 hours.  Walking will help get rid of the air and reduce the bloated feeling in your belly (abdomen).  No driving for 24 hours (because of the medicine (anesthesia) used during the test).   Do not sign any important legal documents or operate any machinery for 24 hours (because of the anesthesia used during the test).  NUTRITION Drink plenty of fluids.  You may resume your normal diet as instructed by your doctor.  Begin with a light meal and progress to your normal diet. Heavy or fried foods are harder to digest and may make you feel sick to your stomach (nauseated).  Avoid alcoholic beverages for 24 hours or as instructed.  MEDICATIONS You may resume your normal medications unless your doctor tells you otherwise.  WHAT YOU CAN EXPECT TODAY Some feelings of bloating in the abdomen.  Passage of more gas than usual.  Spotting of blood in your stool or on the toilet paper.  IF YOU HAD POLYPS REMOVED DURING THE COLONOSCOPY: No aspirin products for 7 days or as instructed.  No alcohol for 7 days or as instructed.  Eat a soft diet for the next 24 hours.  FINDING OUT THE RESULTS OF YOUR TEST Not all test results are available during your visit. If your test results are not back during the visit, make an appointment with your caregiver to find out the  results. Do not assume everything is normal if you have not heard from your caregiver or the medical facility. It is important for you to follow up on all of your test results.  SEEK IMMEDIATE MEDICAL ATTENTION IF: You have more than a spotting of blood in your stool.  Your belly is swollen (abdominal distention).  You are nauseated or vomiting.  You have a temperature over 101.  You have abdominal pain or discomfort that is severe or gets worse throughout the day.      1 polyp removed from your colon today  Colon polyp and diverticulosis information provided   further recommendations to follow pending review of pathology report   at patient request, I called Tsosie Billing at [phone] -  rolled to voicemail.  Left a message.

## 2022-07-23 NOTE — Transfer of Care (Signed)
Immediate Anesthesia Transfer of Care Note  Patient: Casey Castillo  Procedure(s) Performed: COLONOSCOPY WITH PROPOFOL POLYPECTOMY INTESTINAL  Patient Location: PACU  Anesthesia Type:General  Level of Consciousness: awake  Airway & Oxygen Therapy: Patient Spontanous Breathing  Post-op Assessment: Report given to RN and Post -op Vital signs reviewed and stable  Post vital signs: Reviewed and stable  Last Vitals:  Vitals Value Taken Time  BP    Temp    Pulse    Resp    SpO2      Last Pain:  Vitals:   07/23/22 1121  TempSrc:   PainSc: 0-No pain         Complications: No notable events documented.

## 2022-07-23 NOTE — Op Note (Signed)
[facility] Patient Name: Casey Castillo Procedure Date: 07/23/2022 11:01 AM MRN: [id number] Date of Birth: Aug 21, 1945 Attending MD: Norvel Richards , MD, [id number] CSN: [id number] Age: 77 Admit Type: Outpatient Procedure:                Colonoscopy Indications:              High risk colon cancer surveillance: Personal                            history of colonic polyps Providers:                Norvel Richards, MD, [place] Page, Ladoris Gene                            Technician, Technician Referring MD:              Medicines:                Propofol per Anesthesia Complications:            No immediate complications. Estimated Blood Loss:     Estimated blood loss was minimal. Procedure:                Pre-Anesthesia Assessment:                           - Prior to the procedure, a History and Physical                            was performed, and patient medications and                            allergies were reviewed. The patient's tolerance of                            previous anesthesia was also reviewed. The risks                            and benefits of the procedure and the sedation                            options and risks were discussed with the patient.                            All questions were answered, and informed consent                            was obtained. Prior Anticoagulants: The patient has                            taken no anticoagulant or antiplatelet agents. ASA                            Grade Assessment: II - A patient with mild systemic  disease. After reviewing the risks and benefits,                            the patient was deemed in satisfactory condition to                            undergo the procedure.                           After obtaining informed consent, the colonoscope                            was passed under direct vision. Throughout the                            procedure,  the patient's blood pressure, pulse, and                            oxygen saturations were monitored continuously. The                            [id number]) scope was introduced through                            the anus and advanced to the the cecum, identified                            by appendiceal orifice and ileocecal valve. The                            colonoscopy was performed without difficulty. The                            patient tolerated the procedure well. The quality                            of the bowel preparation was adequate. The                            ileocecal valve, appendiceal orifice, and rectum                            were photographed. The entire colon was well                            visualized. Scope In: 11:24:50 AM Scope Out: 11:37:06 AM Scope Withdrawal Time: 0 hours 8 minutes 22 seconds  Total Procedure Duration: 0 hours 12 minutes 16 seconds  Findings:      The perianal and digital rectal examinations were normal.      Multiple medium-mouthed diverticula were found in the sigmoid colon and       descending colon.      A 7 mm polyp was found in the ascending colon. The polyp was       semi-pedunculated. The polyp was removed with a  cold snare. Resection       and retrieval were complete. Estimated blood loss was minimal.      The exam was otherwise without abnormality on direct and retroflexion       views. Impression:               - Diverticulosis in the sigmoid colon and in the                            descending colon.                           - One 7 mm polyp in the ascending colon, removed                            with a cold snare. Resected and retrieved.                           - The examination was otherwise normal on direct                            and retroflexion views. Moderate Sedation:      Moderate (conscious) sedation was personally administered by an       anesthesia professional. The following  parameters were monitored: oxygen       saturation, heart rate, blood pressure, respiratory rate, EKG, adequacy       of pulmonary ventilation, and response to care. Recommendation:           - Patient has a contact number available for                            emergencies. The signs and symptoms of potential                            delayed complications were discussed with the                            patient. Return to normal activities tomorrow.                            Written discharge instructions were provided to the                            patient.                           - Resume previous diet.                           - Continue present medications.                           - Repeat colonoscopy date to be determined after                            pending pathology results are reviewed for  surveillance based on pathology results.                           - Return to GI office (date not yet determined). Procedure Code(s):        --- Professional ---                           [phone], Colonoscopy, flexible; with removal of                            tumor(s), polyp(s), or other lesion(s) by snare                            technique Diagnosis Code(s):        --- Professional ---                           Z86.010, Personal history of colonic polyps                           D12.2, Benign neoplasm of ascending colon                           K57.30, Diverticulosis of large intestine without                            perforation or abscess without bleeding CPT copyright 2022 American [employer] Association. All rights reserved. The codes documented in this report are preliminary and upon coder review may  be revised to meet current compliance requirements. [name]. [name], MD Norvel Richards, MD 07/23/2022 11:45:43 AM This report has been signed electronically. Number of Addenda: 0

## 2022-07-23 NOTE — Anesthesia Preprocedure Evaluation (Signed)
Anesthesia Evaluation  Patient identified by MRN, date of birth, ID band Patient awake    Reviewed: Allergy & Precautions, H&P , NPO status , Patient's Chart, lab work & pertinent test results, reviewed documented beta blocker date and time   Airway Mallampati: II  TM Distance: >3 FB Neck ROM: full    Dental no notable dental hx.    Pulmonary neg pulmonary ROS, pneumonia, former smoker   Pulmonary exam normal breath sounds clear to auscultation       Cardiovascular Exercise Tolerance: Good + CAD  negative cardio ROS  Rhythm:regular Rate:Normal     Neuro/Psych  PSYCHIATRIC DISORDERS Anxiety      Neuromuscular disease negative neurological ROS  negative psych ROS   GI/Hepatic negative GI ROS, Neg liver ROS,GERD  ,,  Endo/Other  negative endocrine ROS    Renal/GU negative Renal ROS  negative genitourinary   Musculoskeletal   Abdominal   Peds  Hematology negative hematology ROS (+)   Anesthesia Other Findings   Reproductive/Obstetrics negative OB ROS                             Anesthesia Physical Anesthesia Plan  ASA: 2  Anesthesia Plan: General   Post-op Pain Management:    Induction:   PONV Risk Score and Plan: Propofol infusion  Airway Management Planned:   Additional Equipment:   Intra-op Plan:   Post-operative Plan:   Informed Consent: I have reviewed the patients History and Physical, chart, labs and discussed the procedure including the risks, benefits and alternatives for the proposed anesthesia with the patient or authorized representative who has indicated his/her understanding and acceptance.     Dental Advisory Given  Plan Discussed with: CRNA  Anesthesia Plan Comments:        Anesthesia Quick Evaluation

## 2022-07-24 ENCOUNTER — Encounter: Payer: Self-pay | Admitting: Internal Medicine

## 2022-07-24 LAB — SURGICAL PATHOLOGY

## 2022-07-24 NOTE — Anesthesia Postprocedure Evaluation (Signed)
Anesthesia Post Note  Patient: Casey Castillo  Procedure(s) Performed: COLONOSCOPY WITH PROPOFOL POLYPECTOMY INTESTINAL  Patient location during evaluation: Phase II Anesthesia Type: General Level of consciousness: awake Pain management: pain level controlled Vital Signs Assessment: post-procedure vital signs reviewed and stable Respiratory status: spontaneous breathing and respiratory function stable Cardiovascular status: blood pressure returned to baseline and stable Postop Assessment: no headache and no apparent nausea or vomiting Anesthetic complications: no Comments: Late entry   No notable events documented.   Last Vitals:  Vitals:   07/23/22 1149 07/23/22 1154  BP: (!) 92/58 111/64  Pulse: 72 70  Resp: 19 18  Temp:    SpO2: 95% 95%    Last Pain:  Vitals:   07/23/22 1154  TempSrc:   PainSc: 0-No pain                 Louann Sjogren

## 2022-07-30 ENCOUNTER — Encounter (HOSPITAL_COMMUNITY): Payer: Self-pay | Admitting: Internal Medicine

## 2022-08-07 DIAGNOSIS — F4312 Post-traumatic stress disorder, chronic: Secondary | ICD-10-CM | POA: Diagnosis not present

## 2022-10-09 DIAGNOSIS — F4312 Post-traumatic stress disorder, chronic: Secondary | ICD-10-CM | POA: Diagnosis not present

## 2022-10-25 DIAGNOSIS — L820 Inflamed seborrheic keratosis: Secondary | ICD-10-CM | POA: Diagnosis not present

## 2022-10-25 DIAGNOSIS — L218 Other seborrheic dermatitis: Secondary | ICD-10-CM | POA: Diagnosis not present

## 2022-10-25 DIAGNOSIS — B078 Other viral warts: Secondary | ICD-10-CM | POA: Diagnosis not present

## 2022-11-22 ENCOUNTER — Other Ambulatory Visit: Payer: Self-pay

## 2022-11-22 ENCOUNTER — Telehealth: Payer: Self-pay | Admitting: Cardiovascular Disease

## 2022-11-22 MED ORDER — SIMVASTATIN 40 MG PO TABS: 40.0000 mg | ORAL_TABLET | Freq: Every day | ORAL | 0 refills | Status: AC

## 2022-11-22 NOTE — Telephone Encounter (Signed)
Pt's medication was sent to pt's pharmacy as requested. Confirmation received.  °

## 2022-11-22 NOTE — Telephone Encounter (Signed)
*  STAT* If patient is at the pharmacy, call can be transferred to refill team.   1. Which medications need to be refilled? (please list name of each medication and dose if known) new prescription for Simvastatin  2. Which pharmacy/location (including street and city if local pharmacy) is medication to be sent to?[facility] Fax# [phone]  3. Do they need a 30 day or 90 day supply? 90 days and refills

## 2022-11-27 DIAGNOSIS — F4312 Post-traumatic stress disorder, chronic: Secondary | ICD-10-CM | POA: Diagnosis not present

## 2023-01-07 DIAGNOSIS — L820 Inflamed seborrheic keratosis: Secondary | ICD-10-CM | POA: Diagnosis not present

## 2023-01-07 DIAGNOSIS — L570 Actinic keratosis: Secondary | ICD-10-CM | POA: Diagnosis not present

## 2023-01-07 DIAGNOSIS — X32XXXD Exposure to sunlight, subsequent encounter: Secondary | ICD-10-CM | POA: Diagnosis not present

## 2023-01-22 ENCOUNTER — Encounter: Payer: Self-pay | Admitting: *Deleted

## 2023-02-05 DIAGNOSIS — F4312 Post-traumatic stress disorder, chronic: Secondary | ICD-10-CM | POA: Diagnosis not present

## 2023-02-27 DIAGNOSIS — H0015 Chalazion left lower eyelid: Secondary | ICD-10-CM | POA: Diagnosis not present

## 2023-02-27 DIAGNOSIS — Z6833 Body mass index (BMI) 33.0-33.9, adult: Secondary | ICD-10-CM | POA: Diagnosis not present

## 2023-02-27 DIAGNOSIS — N400 Enlarged prostate without lower urinary tract symptoms: Secondary | ICD-10-CM | POA: Diagnosis not present

## 2023-02-27 DIAGNOSIS — E6609 Other obesity due to excess calories: Secondary | ICD-10-CM | POA: Diagnosis not present

## 2023-02-27 DIAGNOSIS — I10 Essential (primary) hypertension: Secondary | ICD-10-CM | POA: Diagnosis not present

## 2023-04-03 DIAGNOSIS — L820 Inflamed seborrheic keratosis: Secondary | ICD-10-CM | POA: Diagnosis not present

## 2023-04-03 DIAGNOSIS — X32XXXD Exposure to sunlight, subsequent encounter: Secondary | ICD-10-CM | POA: Diagnosis not present

## 2023-04-03 DIAGNOSIS — L570 Actinic keratosis: Secondary | ICD-10-CM | POA: Diagnosis not present

## 2023-04-08 DIAGNOSIS — E6609 Other obesity due to excess calories: Secondary | ICD-10-CM | POA: Diagnosis not present

## 2023-04-08 DIAGNOSIS — Z0001 Encounter for general adult medical examination with abnormal findings: Secondary | ICD-10-CM | POA: Diagnosis not present

## 2023-04-08 DIAGNOSIS — D518 Other vitamin B12 deficiency anemias: Secondary | ICD-10-CM | POA: Diagnosis not present

## 2023-04-08 DIAGNOSIS — Z6834 Body mass index (BMI) 34.0-34.9, adult: Secondary | ICD-10-CM | POA: Diagnosis not present

## 2023-04-08 DIAGNOSIS — N400 Enlarged prostate without lower urinary tract symptoms: Secondary | ICD-10-CM | POA: Diagnosis not present

## 2023-04-08 DIAGNOSIS — Z125 Encounter for screening for malignant neoplasm of prostate: Secondary | ICD-10-CM | POA: Diagnosis not present

## 2023-04-08 DIAGNOSIS — E559 Vitamin D deficiency, unspecified: Secondary | ICD-10-CM | POA: Diagnosis not present

## 2023-04-08 DIAGNOSIS — Z1331 Encounter for screening for depression: Secondary | ICD-10-CM | POA: Diagnosis not present

## 2023-04-08 DIAGNOSIS — G9332 Myalgic encephalomyelitis/chronic fatigue syndrome: Secondary | ICD-10-CM | POA: Diagnosis not present

## 2023-04-08 DIAGNOSIS — I10 Essential (primary) hypertension: Secondary | ICD-10-CM | POA: Diagnosis not present

## 2023-04-10 DIAGNOSIS — F4312 Post-traumatic stress disorder, chronic: Secondary | ICD-10-CM | POA: Diagnosis not present

## 2023-04-15 NOTE — Progress Notes (Signed)
History of Present Illness: Here for f/u of ED as well as BPH/LUTS.  Past Medical History:  Diagnosis Date   Arthritis    Cervical myelopathy with cervical radiculopathy (HCC)    Claustrophobia    Coronary artery disease    5%blockage on left anterior descending Dr. Royann Shivers    Diverticulitis    hx of   Enlarged prostate    takes flomax   GERD (gastroesophageal reflux disease)    History of blood clots    november, 5 1995- left leg; 2014 - left calf   History of kidney stones    Hyperlipidemia    primary Dr. Evette Cristal   Pneumonia    as a child  77 YEARS OLD   PTSD (post-traumatic stress disorder)    PTSD (post-traumatic stress disorder)    Seasonal allergies    Urinary frequency    hx of     Past Surgical History:  Procedure Laterality Date   ANTERIOR CERVICAL DECOMP/DISCECTOMY FUSION  01/28/2012   Procedure: ANTERIOR CERVICAL DECOMPRESSION/DISCECTOMY FUSION 2 LEVELS;  Surgeon: Barnett Abu, MD;  [facility];  Service: Neurosurgery;  Laterality: N/A;  Cervical six-seven, cervical seven-thoracic one Anterior cervical decompression/diskectomy/fusion   ANTERIOR CERVICAL DECOMP/DISCECTOMY FUSION N/A 12/24/2017   Procedure: ANTERIOR CERVICAL DECOMPRESSION/DISCECTOMY FUSION CERVICAL FOUR-FIVE/CERVICAL FIVE-SIX TWO LEVELS;  Surgeon: Barnett Abu, MD;  Location: [facility];  Service: Neurosurgery;  Laterality: N/A;  ANTERIOR CERVICAL DECOMPRESSION/DISCECTOMY FUSION CERVICAL FOUR-FIVE/CERVICAL FIVE-SIX TWO LEVELS   CARDIAC CATHETERIZATION     2006   CARDIOVASCULAR STRESS TEST     2006 and 2008 at [facility] heart [facility] vascular, 2011 myoview and echo   COLONOSCOPY     COLONOSCOPY N/A 08/30/2015   Dr. Lovell Sheehan: one 20 mm polyp in proximal ascending colon, removed piecemeal using hot snare. Resected and retrieved. Diverticulosis in sigmoid colon. Path with tubular adenoma, no high grade dysplasia. 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 edge   COLONOSCOPY N/A  08/26/2019   sigmoid and descending colon diverticulosis, seven 3-7 mm polyps in cecum, (tubular adenomas and sessile serrated) four 4-6 mm polyps in ascending colon (tubular adenomas), two 4-6 mm polyps in sigmoid and splenic flexure (tubular adenomas), one 25 mm polyp in ascending colon (tubular adenoma) removed via piecemeal fashion   COLONOSCOPY WITH PROPOFOL N/A 02/25/2020   Colonoscopy Sept 2021: two 2-4 mm polyps in rectum and cecum, diverticulosis in sigmoid and descending colon. Minimal hemorrhoids remain. Tubular adenomas. 3 years.    COLONOSCOPY WITH PROPOFOL N/A 07/23/2022   Procedure: COLONOSCOPY WITH PROPOFOL;  Surgeon: Corbin Ade, MD;  [facility];  Service: Endoscopy;  Laterality: N/A;  2:30 PM   engrown  toenail Bilateral    EYE SURGERY     Laser for torn retina   HEMORRHOID SURGERY     sept 8, 2008   KIDNEY STONE SURGERY     lithotripsy 1980's   KNEE SURGERY     left 04-11-94 ( has had two surgeries to left knee)   NASAL SINUS SURGERY     Jun 30 1998   NM MYOVIEW LTD  07/20/2009   no ischemia   PILONIDAL CYST / SINUS EXCISION     surgery 01-08-74   POLYPECTOMY  08/26/2019   Procedure: POLYPECTOMY;  Surgeon: Corbin Ade, MD;  [facility];  Service: Endoscopy;;   POLYPECTOMY  02/25/2020   Procedure: POLYPECTOMY;  Surgeon: Corbin Ade, MD;  [facility];  Service: Endoscopy;;  POLYPECTOMY  07/23/2022   Procedure: POLYPECTOMY INTESTINAL;  Surgeon: Corbin Ade, MD;  [facility];  Service: Endoscopy;;   RADIOLOGY WITH ANESTHESIA N/A 06/12/2018   Procedure: MRI OF CHEST WITH AND WITHOUT CONTRAST;  Surgeon: Radiologist, Medication, MD;  Location: [facility];  Service: Radiology;  Laterality: N/A;   SHOULDER SURGERY     right 12-20-2003   TOTAL KNEE ARTHROPLASTY Left 09/15/2019   Procedure: TOTAL KNEE ARTHROPLASTY;  Surgeon: Sheral Apley, MD;  Location: [facility];  Service: Orthopedics;  Laterality: Left;   TRIGGER FINGER  RELEASE     03-21-98 right hand ring finger   US ECHOCARDIOGRAPHY  07/20/2009   LA & RA mildly dilated,trace MR,TR.    Home Medications:  Allergies as of 04/16/2023       Reactions   Adhesive [tape] Hives, Rash, Other (See Comments)   EKG electrodes cause whelps.  Severe PLEASE WIPE OFF WITH ALCOHOL SWAB.        Medication List        Accurate as of April 15, 2023  2:58 PM. If you have any questions, ask your nurse or doctor.          acetaminophen 650 MG CR tablet Commonly known as: TYLENOL Take 650-1,300 mg by mouth every 8 (eight) hours as needed for pain.   aspirin EC 81 MG tablet Take 81 mg by mouth daily. Swallow whole.   Biotin 5000 MCG Tabs Take 5,000 mcg by mouth daily.   calcium elemental as carbonate 400 MG chewable tablet Commonly known as: BARIATRIC TUMS ULTRA Chew 2,000-3,000 mg by mouth daily as needed for heartburn.   erythromycin ophthalmic ointment Place a 1/2 inch ribbon of ointment into the left lower eyelid BID prn.   Fish Oil 1200 MG Caps Take 1,200 mg by mouth at bedtime.   furosemide 20 MG tablet Commonly known as: LASIX Take 20 mg by mouth 3 (three) times daily.   loratadine 10 MG tablet Commonly known as: CLARITIN Take 10 mg by mouth daily.   Lysine HCl 500 MG Tabs Take 500 mg by mouth at bedtime.   melatonin 5 MG Tabs Take 10 mg by mouth at bedtime as needed (sleep).   oxybutynin 5 MG tablet Commonly known as: DITROPAN Take 1 tablet (5 mg total) by mouth daily.   pantoprazole 40 MG tablet Commonly known as: PROTONIX Take 40 mg by mouth 2 (two) times daily.   simvastatin 40 MG tablet Commonly known as: ZOCOR Take 1 tablet (40 mg total) by mouth daily.   tadalafil 5 MG tablet Commonly known as: CIALIS TAKE ONE TABLET BY MOUTH EVERY EVENING   tamsulosin 0.4 MG Caps capsule Commonly known as: FLOMAX Take 1 capsule (0.4 mg total) by mouth 2 (two) times daily.   valACYclovir 500 MG tablet Commonly known as:  VALTREX Take 500 mg by mouth daily as needed (fever blisters.).   Vitamin D 50 MCG (2000 UT) Caps Take 2,000 Units by mouth at bedtime.        Allergies:  Allergies  Allergen Reactions   Adhesive [Tape] Hives, Rash and Other (See Comments)    EKG electrodes cause whelps.  Severe PLEASE WIPE OFF WITH ALCOHOL SWAB.    Family History  Problem Relation Age of Onset   Heart attack Father    Dementia Mother    Heart disease Other    Colon cancer Neg Hx     Social History:  reports that he quit smoking about 52 years  ago. His smoking use included cigarettes. He started smoking about 72 years ago. He has a 20 pack-year smoking history. He has never used smokeless tobacco. He reports that he does not drink alcohol and does not use drugs.  ROS: A complete review of systems was performed.  All systems are negative except for pertinent findings as noted.  Physical Exam:  Vital signs in last 24 hours: There were no vitals taken for this visit. Constitutional:  Alert and oriented, No acute distress Cardiovascular: Regular rate  Respiratory: Normal respiratory effort GI: Abdomen is soft, nontender, nondistended, no abdominal masses. No CVAT.  Genitourinary: Normal male phallus, testes are descended bilaterally and non-tender and without masses, scrotum is normal in appearance without lesions or masses, perineum is normal on inspection. Lymphatic: No lymphadenopathy Neurologic: Grossly intact, no focal deficits Psychiatric: Normal mood and affect  I have reviewed prior pt notes  I have reviewed notes from referring/previous physicians  I have reviewed urinalysis results  I have independently reviewed prior imaging  I have reviewed prior PSA results  I have reviewed prior urine culture   Impression/Assessment:  ***  Plan:  ***

## 2023-04-16 ENCOUNTER — Ambulatory Visit: Payer: PPO | Admitting: Urology

## 2023-04-16 VITALS — BP 170/75 | HR 62 | Ht 71.0 in | Wt 242.6 lb

## 2023-04-16 DIAGNOSIS — N401 Enlarged prostate with lower urinary tract symptoms: Secondary | ICD-10-CM | POA: Diagnosis not present

## 2023-04-16 DIAGNOSIS — N138 Other obstructive and reflux uropathy: Secondary | ICD-10-CM

## 2023-04-16 DIAGNOSIS — N3281 Overactive bladder: Secondary | ICD-10-CM

## 2023-04-16 DIAGNOSIS — N5201 Erectile dysfunction due to arterial insufficiency: Secondary | ICD-10-CM

## 2023-04-16 LAB — URINALYSIS, ROUTINE W REFLEX MICROSCOPIC
Bilirubin, UA: NEGATIVE
Glucose, UA: NEGATIVE
Ketones, UA: NEGATIVE
Leukocytes,UA: NEGATIVE
Nitrite, UA: NEGATIVE
Protein,UA: NEGATIVE
RBC, UA: NEGATIVE
Specific Gravity, UA: 1.025 (ref 1.005–1.030)
Urobilinogen, Ur: 0.2 mg/dL (ref 0.2–1.0)
pH, UA: 6 (ref 5.0–7.5)

## 2023-04-16 MED ORDER — TADALAFIL 5 MG PO TABS: 5.0000 mg | ORAL_TABLET | Freq: Every evening | ORAL | 3 refills | Status: DC

## 2023-04-19 LAB — LAB REPORT - SCANNED: EGFR: 64

## 2023-05-06 ENCOUNTER — Ambulatory Visit: Payer: PPO | Attending: Cardiovascular Disease | Admitting: Cardiovascular Disease

## 2023-05-06 ENCOUNTER — Encounter: Payer: Self-pay | Admitting: Cardiovascular Disease

## 2023-05-06 VITALS — BP 118/66 | HR 86 | Ht 70.0 in | Wt 232.4 lb

## 2023-05-06 DIAGNOSIS — Z86718 Personal history of other venous thrombosis and embolism: Secondary | ICD-10-CM | POA: Diagnosis not present

## 2023-05-06 DIAGNOSIS — E669 Obesity, unspecified: Secondary | ICD-10-CM

## 2023-05-06 DIAGNOSIS — I2510 Atherosclerotic heart disease of native coronary artery without angina pectoris: Secondary | ICD-10-CM | POA: Diagnosis not present

## 2023-05-06 DIAGNOSIS — E782 Mixed hyperlipidemia: Secondary | ICD-10-CM

## 2023-05-06 NOTE — Progress Notes (Unsigned)
Patient ID: Casey Castillo, male   DOB: [birth year], 77 y.o.   MRN: [id number]    Cardiology Office Note    Date:  05/09/2023   ID:  Casey Castillo, DOB [birth year], MRN [id number]  PCP:  Elfredia Nevins, MD  Cardiologist:   Thurmon Fair, MD   No chief complaint on file.   History of Present Illness:  Casey Castillo is a 77 y.o. male with a history of remote DVT of the lower extremity, no longer on anticoagulation, coronary atherosclerosis without severe stenoses (remote coronary angiography) and hyperlipidemia.  Left he has been generally doing well since his last appointment.  He has not required any hospitalizations or emergency room visits  The patient specifically denies any chest pain at rest or with exertion, dyspnea at rest or with exertion, orthopnea, paroxysmal nocturnal dyspnea, syncope, palpitations, focal neurological deficits, intermittent claudication, lower extremity edema, unexplained weight gain, cough, hemoptysis or wheezing.  He remains very physically active and enjoys shag dancing as well as working in his yard.  Occasionally struggles with PTSD following his service in [place].  He brought me a copy of his labs from PCP.  His total cholesterol is 158, triglycerides 133, HDL 42 and calculated LDL 92.  His routine chemistries and TSH were normal.  He did not have a hemoglobin A1c checked (this was borderline elevated at 6.1% back in 2020).   Past Medical History:  Diagnosis Date   Arthritis    Cervical myelopathy with cervical radiculopathy (HCC)    Claustrophobia    Coronary artery disease    5%blockage on left anterior descending Dr. Royann Shivers    Diverticulitis    hx of   Enlarged prostate    takes flomax   GERD (gastroesophageal reflux disease)    History of blood clots    november, 5 1995- left leg; 2014 - left calf   History of kidney stones    Hyperlipidemia    primary Dr. Evette Cristal   Pneumonia    as a child  14 YEARS OLD   PTSD (post-traumatic  stress disorder)    PTSD (post-traumatic stress disorder)    Seasonal allergies    Urinary frequency    hx of     Past Surgical History:  Procedure Laterality Date   ANTERIOR CERVICAL DECOMP/DISCECTOMY FUSION  01/28/2012   Procedure: ANTERIOR CERVICAL DECOMPRESSION/DISCECTOMY FUSION 2 LEVELS;  Surgeon: Barnett Abu, MD;  [facility];  Service: Neurosurgery;  Laterality: N/A;  Cervical six-seven, cervical seven-thoracic one Anterior cervical decompression/diskectomy/fusion   ANTERIOR CERVICAL DECOMP/DISCECTOMY FUSION N/A 12/24/2017   Procedure: ANTERIOR CERVICAL DECOMPRESSION/DISCECTOMY FUSION CERVICAL FOUR-FIVE/CERVICAL FIVE-SIX TWO LEVELS;  Surgeon: Barnett Abu, MD;  [facility];  Service: Neurosurgery;  Laterality: N/A;  ANTERIOR CERVICAL DECOMPRESSION/DISCECTOMY FUSION CERVICAL FOUR-FIVE/CERVICAL FIVE-SIX TWO LEVELS   CARDIAC CATHETERIZATION     2006   CARDIOVASCULAR STRESS TEST     2006 and 2008 at [facility] heart and vascular, 2011 myoview and echo   COLONOSCOPY     COLONOSCOPY N/A 08/30/2015   Dr. Lovell Sheehan: one 20 mm polyp in proximal ascending colon, removed piecemeal using hot snare. Resected and retrieved. Diverticulosis in sigmoid colon. Path with tubular adenoma, no high grade dysplasia. 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 edge   COLONOSCOPY N/A 08/26/2019   sigmoid and descending colon diverticulosis, seven 3-7 mm polyps in cecum, (tubular adenomas and sessile serrated) four 4-6 mm polyps in ascending colon (tubular adenomas), two 4-6 mm polyps in sigmoid and  splenic flexure (tubular adenomas), one 25 mm polyp in ascending colon (tubular adenoma) removed via piecemeal fashion   COLONOSCOPY WITH PROPOFOL N/A 02/25/2020   Colonoscopy Sept 2021: two 2-4 mm polyps in rectum and cecum, diverticulosis in sigmoid and descending colon. Minimal hemorrhoids remain. Tubular adenomas. 3 years.    COLONOSCOPY WITH PROPOFOL N/A 07/23/2022   Procedure:  COLONOSCOPY WITH PROPOFOL;  Surgeon: Corbin Ade, MD;  [facility];  Service: Endoscopy;  Laterality: N/A;  2:30 PM   engrown  toenail Bilateral    EYE SURGERY     Laser for torn retina   HEMORRHOID SURGERY     sept 8, 2008   KIDNEY STONE SURGERY     lithotripsy 1980's   KNEE SURGERY     left 04-11-94 ( has had two surgeries to left knee)   NASAL SINUS SURGERY     Jun 30 1998   NM MYOVIEW LTD  07/20/2009   no ischemia   PILONIDAL CYST / SINUS EXCISION     surgery 01-08-74   POLYPECTOMY  08/26/2019   Procedure: POLYPECTOMY;  Surgeon: Corbin Ade, MD;  [facility];  Service: Endoscopy;;   POLYPECTOMY  02/25/2020   Procedure: POLYPECTOMY;  Surgeon: Corbin Ade, MD;  [facility];  Service: Endoscopy;;   POLYPECTOMY  07/23/2022   Procedure: POLYPECTOMY INTESTINAL;  Surgeon: Corbin Ade, MD;  [facility];  Service: Endoscopy;;   RADIOLOGY WITH ANESTHESIA N/A 06/12/2018   Procedure: MRI OF CHEST WITH AND WITHOUT CONTRAST;  Surgeon: Radiologist, Medication, MD;  [facility]: [facility];  Service: Radiology;  Laterality: N/A;   SHOULDER SURGERY     right 12-20-2003   TOTAL KNEE ARTHROPLASTY Left 09/15/2019   Procedure: TOTAL KNEE ARTHROPLASTY;  Surgeon: Sheral Apley, MD;  Location: [facility];  Service: Orthopedics;  Laterality: Left;   TRIGGER FINGER RELEASE     03-21-98 right hand ring finger   US ECHOCARDIOGRAPHY  07/20/2009   LA & RA mildly dilated,trace MR,TR.    Current Outpatient Medications  Medication Sig Dispense Refill   acetaminophen (TYLENOL) 650 MG CR tablet Take 650-1,300 mg by mouth every 8 (eight) hours as needed for pain.     aspirin EC 81 MG tablet Take 81 mg by mouth daily. Swallow whole.     Biotin 5000 MCG TABS Take 5,000 mcg by mouth daily.      calcium elemental as carbonate (BARIATRIC TUMS ULTRA) 400 MG chewable tablet Chew 2,000-3,000 mg by mouth daily as needed for heartburn.     Cholecalciferol (VITAMIN D) 2000  UNITS CAPS Take 2,000 Units by mouth at bedtime.      erythromycin ophthalmic ointment Place a 1/2 inch ribbon of ointment into the left lower eyelid BID prn. 3.5 g 0   furosemide (LASIX) 20 MG tablet Take 20 mg by mouth 3 (three) times daily.     loratadine (CLARITIN) 10 MG tablet Take 10 mg by mouth daily.      Lysine HCl 500 MG TABS Take 500 mg by mouth at bedtime.      Melatonin 5 MG TABS Take 10 mg by mouth at bedtime as needed (sleep).     Omega-3 Fatty Acids (FISH OIL) 1200 MG CAPS Take 1,200 mg by mouth at bedtime.     oxybutynin (DITROPAN) 5 MG tablet Take 1 tablet (5 mg total) by mouth daily. 90 tablet 3   pantoprazole (PROTONIX) 40 MG tablet Take 40 mg by mouth  2 (two) times daily.  2   simvastatin (ZOCOR) 40 MG tablet Take 1 tablet (40 mg total) by mouth daily. 90 tablet 0   tadalafil (CIALIS) 5 MG tablet Take 1 tablet (5 mg total) by mouth every evening. 90 tablet 3   tamsulosin (FLOMAX) 0.4 MG CAPS capsule Take 1 capsule (0.4 mg total) by mouth 2 (two) times daily. 180 capsule 3   valACYclovir (VALTREX) 500 MG tablet Take 500 mg by mouth daily as needed (fever blisters.).     No current facility-administered medications for this visit.    Allergies:   Adhesive [tape]   Social History   Socioeconomic History   Marital status: Widowed    Spouse name: Not on file   Number of children: Not on file   Years of education: Not on file   Highest education level: Not on file  Occupational History   Not on file  Tobacco Use   Smoking status: Former    Current packs/day: 0.00    Average packs/day: 1 pack/day for 20.0 years (20.0 ttl pk-yrs)    Types: Cigarettes    Start date: 51    Quit date: 4    Years since quitting: 52.9   Smokeless tobacco: Never   Tobacco comments:    quit smokin cigarettes at age 23  Vaping Use   Vaping status: Never Used  Substance and Sexual Activity   Alcohol use: No   Drug use: No   Sexual activity: Not Currently  Other Topics Concern    Not on file  Social History Narrative   Not on file   Social Determinants of Health   Financial Resource Strain: Not on file  Food Insecurity: Not on file  Transportation Needs: Not on file  Physical Activity: Not on file  Stress: Not on file  Social Connections: Not on file     Family History:  The patient's family history includes Dementia in his mother; Heart attack in his father; Heart disease in an other family member.   ROS:   Please see the history of present illness.    Review of Systems  Musculoskeletal:  Positive for arthritis, joint pain and joint swelling.   All other systems are reviewed and are negative.   PHYSICAL EXAM:   VS:  BP 118/66 (BP Location: Left Arm, Patient Position: Sitting, Cuff Size: Large)   Pulse 86   Ht 5\' 10"  (1.778 m)   Wt 232 lb 6.4 oz (105.4 kg)   SpO2 96%   BMI 33.35 kg/m       General: Alert, oriented x3, no distress, mildly obese Head: no evidence of trauma, PERRL, EOMI, no exophtalmos or lid lag, no myxedema, no xanthelasma; normal ears, nose and oropharynx Neck: normal jugular venous pulsations and no hepatojugular reflux; brisk carotid pulses without delay and no carotid bruits Chest: clear to auscultation, no signs of consolidation by percussion or palpation, normal fremitus, symmetrical and full respiratory excursions Cardiovascular: normal position and quality of the apical impulse, regular rhythm, normal first and second heart sounds, no murmurs, rubs or gallops Abdomen: no tenderness or distention, no masses by palpation, no abnormal pulsatility or arterial bruits, normal bowel sounds, no hepatosplenomegaly Extremities: Trivial ankle edema; 2+ radial, ulnar and brachial pulses bilaterally; 2+ right femoral, posterior tibial and dorsalis pedis pulses; 2+ left femoral, posterior tibial and dorsalis pedis pulses; no subclavian or femoral bruits Neurological: grossly nonfocal Psych: Normal mood and affect     Wt Readings from  Last 3  Encounters:  05/06/23 232 lb 6.4 oz (105.4 kg)  04/16/23 242 lb 9.6 oz (110 kg)  07/03/22 242 lb 9.6 oz (110 kg)      Studies/Labs Reviewed:   EKG:    EKG Interpretation Date/Time:  Monday May 06 2023 16:35:44 EST Ventricular Rate:  86 PR Interval:  184 QRS Duration:  80 QT Interval:  358 QTC Calculation: 428 R Axis:   -29  Text Interpretation: Normal sinus rhythm Minimal voltage criteria for LVH, may be normal variant ( R in aVL ) Septal infarct , age undetermined When compared with ECG of 19-Dec-2017 10:53, Septal infarct is now Present Confirmed by Caedyn Tassinari [phone]) on 05/06/2023 4:51:15 PM        Recent Labs:  09/04/2019 hemoglobin 14.3, creatinine 1.08, potassium 4.2  Lipid Panel 07/31/2019 total cholesterol 149, HDL 46, LDL 85, triglycerides 97  62/13/0865 [facility] [place], [place] Glucose 101, potassium 4.5, creatinine 1.18 (GFR 60), hemoglobin 14.3, hemoglobin A1c 6.3%, normal liver function test, TSH 1.63.  Lipid profile not submitted.  04/19/2023 Cholesterol 158, HDL 42, LDL 92, triglycerides 133, creatinine 1.17, potassium 6, ALT 16, TSH 2.62, glucose 135 ASSESSMENT:    1. Atherosclerosis of native coronary artery of native heart without angina pectoris   2. Coronary artery disease involving native coronary artery of native heart without angina pectoris   3. Mixed hyperlipidemia   4. Mild obesity   5. History of deep vein thrombosis (DVT) of lower extremity      PLAN:  In order of problems listed above:    CAD: Asymptomatic.  Minor nonobstructive disease at previous heart catheterization, several normal nuclear stress tests over the years. Continue with risk factor modification.   HLP/ prediabetes: Lipid profile is acceptable, although it would be preferable to try to get his LDL less than 70.  I do not think this is mandatory since he has never had clinically significant CAD or PAD.  Does not have a recent hemoglobin A1c, fasting glucose was  135. Obesity: Struggled with attempts to lose weight.  We reviewed the importance of avoiding sweets and starchy foods and saturated fats and increasing his intake of unsaturated fat and lean protein and whole grain carbohydrates.  He is interested in trying to get on a GLP-1 agonist such as HQIONG.  I am not sure that his insurance will cover this but we can try. Hx of DVT: Negative ultrasound in March of this year.  Remote, no recurrence, no longer on full anticoagulation.  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  Medication Adjustments/Labs and Tests Ordered: Current medicines are reviewed at length with the patient today.  Concerns regarding medicines are outlined above.  Medication changes, Labs and Tests ordered today are listed below. Patient Instructions  Medication Instructions:  No changes *If you need a refill on your cardiac medications before your next appointment, please call your pharmacy*  Follow-Up: At [facility], you and your health needs are our priority.  As part of our continuing mission to provide you with exceptional heart care, we have created designated Provider Care Teams.  These Care Teams include your primary Cardiologist (physician) and Advanced Practice Providers (APPs -  Physician Assistants and Nurse Practitioners) who all work together to provide you with the care you need, when you need it.  We recommend signing up for the patient portal called "MyChart".  Sign up information is provided on this After Visit  Summary.  MyChart is used to connect with patients for Virtual Visits (Telemedicine).  Patients are able to view lab/test results, encounter notes, upcoming appointments, etc.  Non-urgent messages can be sent to your provider as well.   To learn more about what you can do with MyChart, go to [email or web address].    Your next appointment:   1  year(s)  Provider:   Thurmon Fair, MD      Signed, Thurmon Fair, MD  05/09/2023 10:02 AM    [facility] [address] [place], [place], [place]  [address] Phone: [phone]; Fax: [phone]

## 2023-05-06 NOTE — Patient Instructions (Signed)

## 2023-06-26 DIAGNOSIS — F4312 Post-traumatic stress disorder, chronic: Secondary | ICD-10-CM | POA: Diagnosis not present

## 2023-07-12 DIAGNOSIS — R7309 Other abnormal glucose: Secondary | ICD-10-CM | POA: Diagnosis not present

## 2023-08-08 DIAGNOSIS — H04123 Dry eye syndrome of bilateral lacrimal glands: Secondary | ICD-10-CM | POA: Diagnosis not present

## 2023-08-27 DIAGNOSIS — F4312 Post-traumatic stress disorder, chronic: Secondary | ICD-10-CM | POA: Diagnosis not present

## 2023-09-10 DIAGNOSIS — L570 Actinic keratosis: Secondary | ICD-10-CM | POA: Diagnosis not present

## 2023-09-10 DIAGNOSIS — L818 Other specified disorders of pigmentation: Secondary | ICD-10-CM | POA: Diagnosis not present

## 2023-09-10 DIAGNOSIS — L218 Other seborrheic dermatitis: Secondary | ICD-10-CM | POA: Diagnosis not present

## 2023-09-10 DIAGNOSIS — X32XXXD Exposure to sunlight, subsequent encounter: Secondary | ICD-10-CM | POA: Diagnosis not present

## 2023-09-10 DIAGNOSIS — L820 Inflamed seborrheic keratosis: Secondary | ICD-10-CM | POA: Diagnosis not present

## 2023-09-21 ENCOUNTER — Other Ambulatory Visit: Payer: Self-pay | Admitting: Urology

## 2023-09-21 DIAGNOSIS — N3281 Overactive bladder: Secondary | ICD-10-CM

## 2023-10-22 DIAGNOSIS — F4312 Post-traumatic stress disorder, chronic: Secondary | ICD-10-CM | POA: Diagnosis not present

## 2023-10-30 DIAGNOSIS — D225 Melanocytic nevi of trunk: Secondary | ICD-10-CM | POA: Diagnosis not present

## 2023-10-30 DIAGNOSIS — L820 Inflamed seborrheic keratosis: Secondary | ICD-10-CM | POA: Diagnosis not present

## 2023-12-19 DIAGNOSIS — F4312 Post-traumatic stress disorder, chronic: Secondary | ICD-10-CM | POA: Diagnosis not present

## 2024-01-23 ENCOUNTER — Ambulatory Visit: Payer: Self-pay

## 2024-02-06 DIAGNOSIS — X32XXXD Exposure to sunlight, subsequent encounter: Secondary | ICD-10-CM | POA: Diagnosis not present

## 2024-02-06 DIAGNOSIS — L720 Epidermal cyst: Secondary | ICD-10-CM | POA: Diagnosis not present

## 2024-02-06 DIAGNOSIS — L570 Actinic keratosis: Secondary | ICD-10-CM | POA: Diagnosis not present

## 2024-02-06 DIAGNOSIS — L820 Inflamed seborrheic keratosis: Secondary | ICD-10-CM | POA: Diagnosis not present

## 2024-02-17 DIAGNOSIS — H6000 Abscess of external ear, unspecified ear: Secondary | ICD-10-CM | POA: Diagnosis not present

## 2024-02-17 DIAGNOSIS — E782 Mixed hyperlipidemia: Secondary | ICD-10-CM | POA: Diagnosis not present

## 2024-02-17 DIAGNOSIS — F4312 Post-traumatic stress disorder, chronic: Secondary | ICD-10-CM | POA: Diagnosis not present

## 2024-02-17 DIAGNOSIS — Z7689 Persons encountering health services in other specified circumstances: Secondary | ICD-10-CM | POA: Diagnosis not present

## 2024-02-17 DIAGNOSIS — N401 Enlarged prostate with lower urinary tract symptoms: Secondary | ICD-10-CM | POA: Diagnosis not present

## 2024-02-17 DIAGNOSIS — Z79899 Other long term (current) drug therapy: Secondary | ICD-10-CM | POA: Diagnosis not present

## 2024-02-17 DIAGNOSIS — Z23 Encounter for immunization: Secondary | ICD-10-CM | POA: Diagnosis not present

## 2024-02-17 DIAGNOSIS — R351 Nocturia: Secondary | ICD-10-CM | POA: Diagnosis not present

## 2024-02-26 DIAGNOSIS — F4312 Post-traumatic stress disorder, chronic: Secondary | ICD-10-CM | POA: Diagnosis not present

## 2024-04-08 ENCOUNTER — Other Ambulatory Visit: Payer: Self-pay | Admitting: Urology

## 2024-04-08 DIAGNOSIS — N5201 Erectile dysfunction due to arterial insufficiency: Secondary | ICD-10-CM

## 2024-04-13 ENCOUNTER — Other Ambulatory Visit: Payer: Self-pay

## 2024-04-13 DIAGNOSIS — N5201 Erectile dysfunction due to arterial insufficiency: Secondary | ICD-10-CM

## 2024-04-13 NOTE — Telephone Encounter (Signed)
 Refill request sent to MD for approval.

## 2024-04-14 ENCOUNTER — Encounter: Payer: Self-pay | Admitting: Urology

## 2024-04-14 ENCOUNTER — Ambulatory Visit: Payer: PPO | Admitting: Urology

## 2024-04-14 VITALS — BP 142/88 | HR 98

## 2024-04-14 DIAGNOSIS — Z125 Encounter for screening for malignant neoplasm of prostate: Secondary | ICD-10-CM | POA: Diagnosis not present

## 2024-04-14 DIAGNOSIS — N529 Male erectile dysfunction, unspecified: Secondary | ICD-10-CM | POA: Diagnosis not present

## 2024-04-14 DIAGNOSIS — N401 Enlarged prostate with lower urinary tract symptoms: Secondary | ICD-10-CM

## 2024-04-14 DIAGNOSIS — N5201 Erectile dysfunction due to arterial insufficiency: Secondary | ICD-10-CM

## 2024-04-14 LAB — URINALYSIS, ROUTINE W REFLEX MICROSCOPIC
Bilirubin, UA: NEGATIVE
Glucose, UA: NEGATIVE
Ketones, UA: NEGATIVE
Leukocytes,UA: NEGATIVE
Nitrite, UA: NEGATIVE
Protein,UA: NEGATIVE
RBC, UA: NEGATIVE
Specific Gravity, UA: 1.01 (ref 1.005–1.030)
Urobilinogen, Ur: 1 mg/dL (ref 0.2–1.0)
pH, UA: 6.5 (ref 5.0–7.5)

## 2024-04-14 MED ORDER — TADALAFIL 5 MG PO TABS: 5.0000 mg | ORAL_TABLET | Freq: Every evening | ORAL | 3 refills | Status: DC

## 2024-04-14 NOTE — Progress Notes (Signed)
 Impression/Assessment:  1.  BPH with LUTS, overall doing well.  He is on tamsulosin  twice a day, Cialis  and as needed oxybutynin   2.  Screening for prostate cancer-patient still wants this done due to occupational exposure in the past.  PSA 0.98 recently, DRE normal  3.  ED, organic.  On Cialis  daily--refilled today  Plan:  1.  Reassurance regarding his prostate exam.    2.  Continue Flomax  twice a day, oxybutynin , Cialis   3.  I Will have him come back for follow-up in 1 year    History of Present Illness: Here for f/u of ED as well as BPH/LUTS.  He is on tamsulosin  twice a day as well as Cialis  5 mg daily for urinary tract symptoms.  He is happy with the results of these medications.  11.4.2025: Here for routine check. PSA from July 0.98. On flomax  bid, daily cialis . IPSS 13/2  Past Medical History:  Diagnosis Date   Arthritis    Cervical myelopathy with cervical radiculopathy (HCC)    Claustrophobia    Coronary artery disease    5%blockage on left anterior descending Dr. Francyne    Diverticulitis    hx of   Enlarged prostate    takes flomax    GERD (gastroesophageal reflux disease)    History of blood clots    november, 5 1995- left leg; 2014 - left calf   History of kidney stones    Hyperlipidemia    primary Dr. FREDRIK Carnes   Pneumonia    as a child  78 YEARS OLD   PTSD (post-traumatic stress disorder)    PTSD (post-traumatic stress disorder)    Seasonal allergies    Urinary frequency    hx of     Past Surgical History:  Procedure Laterality Date   ANTERIOR CERVICAL DECOMP/DISCECTOMY FUSION  01/28/2012   Procedure: ANTERIOR CERVICAL DECOMPRESSION/DISCECTOMY FUSION 2 LEVELS;  Surgeon: Victory Gens, MD;  Location: [facility];  Service: Neurosurgery;  Laterality: N/A;  Cervical six-seven, cervical seven-thoracic one Anterior cervical decompression/diskectomy/fusion   ANTERIOR CERVICAL DECOMP/DISCECTOMY FUSION N/A 12/24/2017   Procedure: ANTERIOR CERVICAL  DECOMPRESSION/DISCECTOMY FUSION CERVICAL FOUR-FIVE/CERVICAL FIVE-SIX TWO LEVELS;  Surgeon: Gens Victory, MD;  Location: [facility];  Service: Neurosurgery;  Laterality: N/A;  ANTERIOR CERVICAL DECOMPRESSION/DISCECTOMY FUSION CERVICAL FOUR-FIVE/CERVICAL FIVE-SIX TWO LEVELS   CARDIAC CATHETERIZATION     2006   CARDIOVASCULAR STRESS TEST     2006 and 2008 at [facility] heart [facility] vascular, 2011 myoview  and echo   COLONOSCOPY     COLONOSCOPY N/A 08/30/2015   Dr. Mavis: one 20 mm polyp in proximal ascending colon, removed piecemeal using hot snare. Resected and retrieved. Diverticulosis in sigmoid colon. Path with tubular adenoma, no high grade dysplasia. 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 edge   COLONOSCOPY N/A 08/26/2019   sigmoid and descending colon diverticulosis, seven 3-7 mm polyps in cecum, (tubular adenomas and sessile serrated) four 4-6 mm polyps in ascending colon (tubular adenomas), two 4-6 mm polyps in sigmoid and splenic flexure (tubular adenomas), one 25 mm polyp in ascending colon (tubular adenoma) removed via piecemeal fashion   COLONOSCOPY WITH PROPOFOL  N/A 02/25/2020   Colonoscopy Sept 2021: two 2-4 mm polyps in rectum and cecum, diverticulosis in sigmoid and descending colon. Minimal hemorrhoids remain. Tubular adenomas. 3 years.    COLONOSCOPY WITH PROPOFOL  N/A 07/23/2022   Procedure: COLONOSCOPY WITH PROPOFOL ;  Surgeon: Shaaron Lamar HERO, MD;  Location: [facility];  Service: Endoscopy;  Laterality: N/A;  2:30  PM   engrown  toenail Bilateral    EYE SURGERY     Laser for torn retina   HEMORRHOID SURGERY     sept 8, 2008   KIDNEY STONE SURGERY     lithotripsy 1980's   KNEE SURGERY     left 04-11-94 ( has had two surgeries to left knee)   NASAL SINUS SURGERY     Jun 30 1998   [facility] MYOVIEW  LTD  07/20/2009   no ischemia   PILONIDAL CYST / SINUS EXCISION     surgery 01-08-74   POLYPECTOMY  08/26/2019   Procedure: POLYPECTOMY;  Surgeon: Shaaron Lamar HERO, MD;   Location: [facility];  Service: Endoscopy;;   POLYPECTOMY  02/25/2020   Procedure: POLYPECTOMY;  Surgeon: Shaaron Lamar HERO, MD;  Location: [facility];  Service: Endoscopy;;   POLYPECTOMY  07/23/2022   Procedure: POLYPECTOMY INTESTINAL;  Surgeon: Shaaron Lamar HERO, MD;  Location: [facility];  Service: Endoscopy;;   RADIOLOGY WITH ANESTHESIA N/A 06/12/2018   Procedure: MRI OF CHEST WITH AND WITHOUT CONTRAST;  Surgeon: Radiologist, Medication, MD;  Location: [facility];  Service: Radiology;  Laterality: N/A;   SHOULDER SURGERY     right 12-20-2003   TOTAL KNEE ARTHROPLASTY Left 09/15/2019   Procedure: TOTAL KNEE ARTHROPLASTY;  Surgeon: Beverley Evalene BIRCH, MD;  Location: [facility];  Service: Orthopedics;  Laterality: Left;   TRIGGER FINGER RELEASE     03-21-98 right hand ring finger   US  ECHOCARDIOGRAPHY  07/20/2009   LA & RA mildly dilated,trace MR,TR.    Home Medications:  Allergies as of 04/14/2024       Reactions   Adhesive [tape] Hives, Rash, Other (See Comments)   EKG electrodes cause whelps.  Severe PLEASE WIPE OFF WITH ALCOHOL SWAB.        Medication List        Accurate as of April 14, 2024 11:27 AM. If you have any questions, ask your nurse or doctor.          acetaminophen  650 MG CR tablet Commonly known as: TYLENOL  Take 650-1,300 mg by mouth every 8 (eight) hours as needed for pain.   aspirin EC 81 MG tablet Take 81 mg by mouth daily. Swallow whole.   Biotin 5000 MCG Tabs Take 5,000 mcg by mouth daily.   calcium elemental as carbonate 400 MG chewable tablet Commonly known as: BARIATRIC TUMS ULTRA Chew 2,000-3,000 mg by mouth daily as needed for heartburn.   erythromycin  ophthalmic ointment Place a 1/2 inch ribbon of ointment into the left lower eyelid BID prn.   Fish Oil 1200 MG Caps Take 1,200 mg by mouth at bedtime.   furosemide  20 MG tablet Commonly known as: LASIX  Take 20 mg by mouth 3 (three) times daily.   loratadine  10 MG tablet Commonly known as:  CLARITIN  Take 10 mg by mouth daily.   Lysine HCl 500 MG Tabs Take 500 mg by mouth at bedtime.   melatonin 5 MG Tabs Take 10 mg by mouth at bedtime as needed (sleep).   oxybutynin  5 MG tablet Commonly known as: DITROPAN  TAKE 1 TABLET (5 MG TOTAL) BY MOUTH DAILY.   pantoprazole  40 MG tablet Commonly known as: PROTONIX  Take 40 mg by mouth 2 (two) times daily.   simvastatin  40 MG tablet Commonly known as: ZOCOR  Take 1 tablet (40 mg total) by mouth daily.   tadalafil  5 MG tablet Commonly known as: CIALIS  Take 1 tablet (5 mg total) by mouth every evening.  tamsulosin  0.4 MG Caps capsule Commonly known as: FLOMAX  Take 1 capsule (0.4 mg total) by mouth 2 (two) times daily.   valACYclovir 500 MG tablet Commonly known as: VALTREX Take 500 mg by mouth daily as needed (fever blisters.).   Vitamin D 50 MCG (2000 UT) Caps Take 2,000 Units by mouth at bedtime.        Allergies:  Allergies  Allergen Reactions   Adhesive [Tape] Hives, Rash and Other (See Comments)    EKG electrodes cause whelps.  Severe PLEASE WIPE OFF WITH ALCOHOL SWAB.    Family History  Problem Relation Age of Onset   Heart attack Father    Dementia Mother    Heart disease Other    Colon cancer Neg Hx     Social History:  reports that he quit smoking about 53 years ago. His smoking use included cigarettes. He started smoking about 73 years ago. He has a 20 pack-year smoking history. He has never used smokeless tobacco. He reports that he does not drink alcohol and does not use drugs.  ROS: A complete review of systems was performed.  All systems are negative except for pertinent findings as noted.  Physical Exam:  Vital signs in last 24 hours: BP (!) 142/88   Pulse 98  Constitutional:  Alert and oriented, No acute distress Cardiovascular: Regular rate  Respiratory: Normal respiratory effort Genitourinary: Anal sphincter tone is normal.  Prostate 40 g, symmetric, nonnodular, nontender. Lymphatic:  No lymphadenopathy Neurologic: Grossly intact, no focal deficits Psychiatric: Normal mood and affect  I have reviewed prior pt notes  I have reviewed urinalysis results  I have independently reviewed prior imaging--CT stone protocol from 2018.  Renal cysts, no other urologic abnormality.  I have reviewed prior PSA results--0.98  I have reviewed IPSS sheet--913/2

## 2024-04-20 ENCOUNTER — Other Ambulatory Visit: Payer: Self-pay | Admitting: Urology

## 2024-04-20 DIAGNOSIS — N5201 Erectile dysfunction due to arterial insufficiency: Secondary | ICD-10-CM

## 2024-04-20 MED ORDER — TADALAFIL 5 MG PO TABS: 5.0000 mg | ORAL_TABLET | Freq: Every evening | ORAL | 3 refills | Status: AC

## 2024-04-29 DIAGNOSIS — F4312 Post-traumatic stress disorder, chronic: Secondary | ICD-10-CM | POA: Diagnosis not present

## 2024-05-14 DIAGNOSIS — L820 Inflamed seborrheic keratosis: Secondary | ICD-10-CM | POA: Diagnosis not present

## 2024-05-18 ENCOUNTER — Ambulatory Visit

## 2025-03-29 ENCOUNTER — Ambulatory Visit: Admitting: Urology
